# Patient Record
Sex: Male | Born: 1955 | Race: White | Hispanic: No | Marital: Married | Smoking: Never smoker
Health system: Southern US, Community
[De-identification: ages and names within clinical notes are randomized; demographics above are authoritative.]

## PROBLEM LIST (undated history)

## (undated) DIAGNOSIS — I219 Acute myocardial infarction, unspecified: Secondary | ICD-10-CM

## (undated) DIAGNOSIS — I2510 Atherosclerotic heart disease of native coronary artery without angina pectoris: Secondary | ICD-10-CM

## (undated) DIAGNOSIS — I10 Essential (primary) hypertension: Secondary | ICD-10-CM

## (undated) DIAGNOSIS — E785 Hyperlipidemia, unspecified: Secondary | ICD-10-CM

## (undated) DIAGNOSIS — Z9861 Coronary angioplasty status: Secondary | ICD-10-CM

## (undated) HISTORY — DX: Hyperlipidemia, unspecified: E78.5

## (undated) HISTORY — DX: Essential (primary) hypertension: I10

## (undated) HISTORY — PX: TONSILLECTOMY AND ADENOIDECTOMY: SUR1326

---

## 2004-09-22 HISTORY — PX: CORONARY ANGIOPLASTY WITH STENT PLACEMENT: SHX49

## 2005-09-22 HISTORY — PX: CORONARY ANGIOPLASTY WITH STENT PLACEMENT: SHX49

## 2013-09-22 HISTORY — PX: CORONARY ANGIOPLASTY WITH STENT PLACEMENT: SHX49

## 2014-08-09 ENCOUNTER — Encounter: Payer: Self-pay | Admitting: Internal Medicine

## 2014-08-09 ENCOUNTER — Ambulatory Visit (INDEPENDENT_AMBULATORY_CARE_PROVIDER_SITE_OTHER): Payer: 59 | Admitting: Internal Medicine

## 2014-08-09 VITALS — BP 126/84 | HR 70 | Temp 98.0°F | Resp 20 | Ht 67.0 in | Wt 205.0 lb

## 2014-08-09 DIAGNOSIS — I2583 Coronary atherosclerosis due to lipid rich plaque: Principal | ICD-10-CM

## 2014-08-09 DIAGNOSIS — I2510 Atherosclerotic heart disease of native coronary artery without angina pectoris: Secondary | ICD-10-CM

## 2014-08-09 MED ORDER — ATORVASTATIN CALCIUM 80 MG PO TABS: 80.0000 mg | ORAL_TABLET | Freq: Every day | ORAL | Status: DC

## 2014-08-09 NOTE — Progress Notes (Signed)
Pre visit review using our clinic review tool, if applicable. No additional management support is needed unless otherwise documented below in the visit note. 

## 2014-08-09 NOTE — Patient Instructions (Addendum)
Cardiology follow-up as discussed  Cardiac Diet This diet can help prevent heart disease and stroke. Many factors influence your heart health, including eating and exercise habits. Coronary risk rises a lot with abnormal blood fat (lipid) levels. Cardiac meal planning includes limiting unhealthy fats, increasing healthy fats, and making other small dietary changes. General guidelines are as follows:  Adjust calorie intake to reach and maintain desirable body weight.  Limit total fat intake to less than 30% of total calories. Saturated fat should be less than 7% of calories.  Saturated fats are found in animal products and in some vegetable products. Saturated vegetable fats are found in coconut oil, cocoa butter, palm oil, and palm kernel oil. Read labels carefully to avoid these products as much as possible. Use butter in moderation. Choose tub margarines and oils that have 2 grams of fat or less. Good cooking oils are canola and olive oils.  Practice low-fat cooking techniques. Do not fry food. Instead, broil, bake, boil, steam, grill, roast on a rack, stir-fry, or microwave it. Other fat reducing suggestions include:  Remove the skin from poultry.  Remove all visible fat from meats.  Skim the fat off stews, soups, and gravies before serving them.  Steam vegetables in water or broth instead of sauting them in fat.  Avoid foods with trans fat (or hydrogenated oils), such as commercially fried foods and commercially baked goods. Commercial shortening and deep-frying fats will contain trans fat.  Increase intake of fruits, vegetables, whole grains, and legumes to replace foods high in fat.  Increase consumption of nuts, legumes, and seeds to at least 4 servings weekly. One serving of a legume equals  cup, and 1 serving of nuts or seeds equals  cup.  Choose whole grains more often. Have 3 servings per day (a serving is 1 ounce [oz]).  Eat 4 to 5 servings of vegetables per day. A serving  of vegetables is 1 cup of raw leafy vegetables;  cup of raw or cooked cut-up vegetables;  cup of vegetable juice.  Eat 4 to 5 servings of fruit per day. A serving of fruit is 1 medium whole fruit;  cup of dried fruit;  cup of fresh, frozen, or canned fruit;  cup of 100% fruit juice.  Increase your intake of dietary fiber to 20 to 30 grams per day. Insoluble fiber may help lower your risk of heart disease and may help curb your appetite.  Soluble fiber binds cholesterol to be removed from the blood. Foods high in soluble fiber are dried beans, citrus fruits, oats, apples, bananas, broccoli, Brussels sprouts, and eggplant.  Try to include foods fortified with plant sterols or stanols, such as yogurt, breads, juices, or margarines. Choose several fortified foods to achieve a daily intake of 2 to 3 grams of plant sterols or stanols.  Foods with omega-3 fats can help reduce your risk of heart disease. Aim to have a 3.5 oz portion of fatty fish twice per week, such as salmon, mackerel, albacore tuna, sardines, lake trout, or herring. If you wish to take a fish oil supplement, choose one that contains 1 gram of both DHA and EPA.  Limit processed meats to 2 servings (3 oz portion) weekly.  Limit the sodium in your diet to 1500 milligrams (mg) per day. If you have high blood pressure, talk to a registered dietitian about a DASH (Dietary Approaches to Stop Hypertension) eating plan.  Limit sweets and beverages with added sugar, such as soda, to no more than  5 servings per week. One serving is:   1 tablespoon sugar.  1 tablespoon jelly or jam.   cup sorbet.  1 cup lemonade.   cup regular soda. CHOOSING FOODS Starches  Allowed: Breads: All kinds (wheat, rye, raisin, white, oatmeal, [place], [place], and English muffin bread). Low-fat rolls: English muffins, frankfurter and hamburger buns, bagels, pita bread, tortillas (not fried). Pancakes, waffles, biscuits, and muffins made with recommended  oil.  Avoid: Products made with saturated or trans fats, oils, or whole milk products. Butter rolls, cheese breads, croissants. Commercial doughnuts, muffins, sweet rolls, biscuits, waffles, pancakes, store-bought mixes. Crackers  Allowed: Low-fat crackers and snacks: Animal, graham, rye, saltine (with recommended oil, no lard), oyster, and matzo crackers. Bread sticks, melba toast, rusks, flatbread, pretzels, and light popcorn.  Avoid: High-fat crackers: cheese crackers, butter crackers, and those made with coconut, palm oil, or trans fat (hydrogenated oils). Buttered popcorn. Cereals  Allowed: Hot or cold whole-grain cereals.  Avoid: Cereals containing coconut, hydrogenated vegetable fat, or animal fat. Potatoes / Pasta / Rice  Allowed: All kinds of potatoes, rice, and pasta (such as macaroni, spaghetti, and noodles).  Avoid: Pasta or rice prepared with cream sauce or high-fat cheese. Chow mein noodles, [place] fries. Vegetables  Allowed: All vegetables and vegetable juices.  Avoid: Fried vegetables. Vegetables in cream, butter, or high-fat cheese sauces. Limit coconut. Fruit in cream or custard. Protein  Allowed: Limit your intake of meat, seafood, and poultry to no more than 6 oz (cooked weight) per day. All lean, well-trimmed beef, veal, pork, and lamb. All chicken and [place] without skin. All fish and shellfish. Wild game: wild duck, rabbit, pheasant, and venison. Egg whites or low-cholesterol egg substitutes may be used as desired. Meatless dishes: recipes with dried beans, peas, lentils, and tofu (soybean curd). Seeds and nuts: all seeds and most nuts.  Avoid: Prime grade and other heavily marbled and fatty meats, such as short ribs, spare ribs, rib eye roast or steak, frankfurters, sausage, bacon, and high-fat luncheon meats, mutton. Caviar. Commercially fried fish. Domestic duck, goose, venison sausage. Organ meats: liver, gizzard, heart, chitterlings, brains, kidney,  sweetbreads. Dairy  Allowed: Low-fat cheeses: nonfat or low-fat cottage cheese (1% or 2% fat), cheeses made with part skim milk, such as mozzarella, farmers, string, or ricotta. (Cheeses should be labeled no more than 2 to 6 grams fat per oz.). Skim (or 1%) milk: liquid, powdered, or evaporated. Buttermilk made with low-fat milk. Drinks made with skim or low-fat milk or cocoa. Chocolate milk or cocoa made with skim or low-fat (1%) milk. Nonfat or low-fat yogurt.  Avoid: Whole milk cheeses, including colby, cheddar, [place], Monterey Jack, [place], [place], [place], American, Swiss, and blue. Creamed cottage cheese, cream cheese. Whole milk and whole milk products, including buttermilk or yogurt made from whole milk, drinks made from whole milk. Condensed milk, evaporated whole milk, and 2% milk. Soups and Combination Foods  Allowed: Low-fat low-sodium soups: broth, dehydrated soups, homemade broth, soups with the fat removed, homemade cream soups made with skim or low-fat milk. Low-fat spaghetti, lasagna, chili, and Spanish rice if low-fat ingredients and low-fat cooking techniques are used.  Avoid: Cream soups made with whole milk, cream, or high-fat cheese. All other soups. Desserts and Sweets  Allowed: Sherbet, fruit ices, gelatins, meringues, and angel food cake. Homemade desserts with recommended fats, oils, and milk products. Jam, jelly, honey, marmalade, sugars, and syrups. Pure sugar candy, such as gum drops, hard candy, jelly beans, marshmallows, mints, and small amounts of dark  chocolate.  Avoid: Commercially prepared cakes, pies, cookies, frosting, pudding, or mixes for these products. Desserts containing whole milk products, chocolate, coconut, lard, palm oil, or palm kernel oil. Ice cream or ice cream drinks. Candy that contains chocolate, coconut, butter, hydrogenated fat, or unknown ingredients. Buttered syrups. Fats and Oils  Allowed: Vegetable oils: safflower, sunflower, corn,  soybean, cottonseed, sesame, canola, olive, or peanut. Non-hydrogenated margarines. Salad dressing or mayonnaise: homemade or commercial, made with a recommended oil. Low or nonfat salad dressing or mayonnaise.  Limit added fats and oils to 6 to 8 tsp per day (includes fats used in cooking, baking, salads, and spreads on bread). Remember to count the "hidden fats" in foods.  Avoid: Solid fats and shortenings: butter, lard, salt pork, bacon drippings. Gravy containing meat fat, shortening, or suet. Cocoa butter, coconut. Coconut oil, palm oil, palm kernel oil, or hydrogenated oils: these ingredients are often used in bakery products, nondairy creamers, whipped toppings, candy, and commercially fried foods. Read labels carefully. Salad dressings made of unknown oils, sour cream, or cheese, such as blue cheese and Roquefort. Cream, all kinds: half-and-half, light, heavy, or whipping. Sour cream or cream cheese (even if "light" or low-fat). Nondairy cream substitutes: coffee creamers and sour cream substitutes made with palm, palm kernel, hydrogenated oils, or coconut oil. Beverages  Allowed: Coffee (regular or decaffeinated), tea. Diet carbonated beverages, mineral water. Alcohol: Check with your caregiver. Moderation is recommended.  Avoid: Whole milk, regular sodas, and juice drinks with added sugar. Condiments  Allowed: All seasonings and condiments. Cocoa powder. "Cream" sauces made with recommended ingredients.  Avoid: Carob powder made with hydrogenated fats. SAMPLE MENU Breakfast   cup orange juice   cup oatmeal  1 slice toast  1 tsp margarine  1 cup skim milk Lunch  [place] sandwich with 2 oz [place], 2 slices bread  Lettuce and tomato slices  Fresh fruit  Carrot sticks  Coffee or tea Snack  Fresh fruit or low-fat crackers Dinner  3 oz lean ground beef  1 baked potato  1 tsp margarine   cup asparagus  Lettuce salad  1 tbs non-creamy dressing   cup peach  slices  1 cup skim milk Document Released: 06/17/2008 Document Revised: 03/09/2012 Document Reviewed: 11/08/2013 Exit[employer] Patient Information 2015 [place], [place]. This information is not intended to replace advice given to you by your health care provider. Make sure you discuss any questions you have with your health care provider.   Please obtain records from your prior physicians

## 2014-08-09 NOTE — Progress Notes (Signed)
Subjective:    Patient ID: Michael [name], male    DOB: 24-Jul-1956, 58 y.o.   MRN: [id number]  HPI 58 year old patient who is seen today to establish with our practice. He has a complex cardiac history which began in 2006 when he presented with an acute MI and underwent intervention for a 100% left circumflex lesion.  In 2007, he underwent stenting of another stenotic lesion, not treated in 2006.  He underwent another diagnostic catheterization in 2012 and in early September 2015 underwent stenting of a 90% RCA lesion  Social history.  Has relocated from [place], [place]; originally resided in [place].  Lifelong nonsmoker.  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  HENT: Negative for congestion, dental problem, ear pain, hearing loss, mouth sores, rhinorrhea, sinus pressure, sneezing, tinnitus, trouble swallowing and voice change.   Eyes: Negative for photophobia, pain, redness and visual disturbance.  Respiratory: Negative for apnea, cough, choking, chest tightness, shortness of breath and wheezing.   Cardiovascular: Negative for chest pain, palpitations and leg swelling.  Gastrointestinal: Negative for nausea, vomiting, abdominal pain, diarrhea, constipation, blood in stool, abdominal distention, anal bleeding and rectal pain.  Genitourinary: Negative for dysuria, urgency, frequency, hematuria, flank pain, decreased urine volume, discharge, penile swelling, scrotal swelling, difficulty urinating, genital sores and testicular pain.  Musculoskeletal: Negative for myalgias, back pain, joint  swelling, arthralgias, gait problem, neck pain and neck stiffness.       History of slight discomfort over the left lateral hip area  Skin: Negative for color change, rash and wound.  Neurological: Negative for dizziness, tremors, seizures, syncope, facial asymmetry, speech difficulty, weakness, light-headedness, numbness and headaches.  Hematological: Negative for adenopathy. Does not bruise/bleed easily.  Psychiatric/Behavioral: Negative for suicidal ideas, hallucinations, behavioral problems, confusion, sleep disturbance, self-injury, dysphoric mood, decreased concentration and agitation. The patient is not nervous/anxious.        Objective:   Physical Exam  Constitutional: He appears well-developed and well-nourished.  Blood pressure 140/90 right arm Blood pressure 120/80 left arm  HENT:  Head: Normocephalic and atraumatic.  Right Ear: External ear normal.  Left Ear: External ear normal.  Nose: Nose normal.  Mouth/Throat: Oropharynx is clear and moist.  Eyes: Conjunctivae and EOM are normal. Pupils are equal, round, and reactive to light. No scleral icterus.  Neck: Normal range of motion. Neck supple. No JVD present. No thyromegaly present.  Cardiovascular: Regular rhythm, normal heart sounds and intact distal pulses.  Exam reveals no gallop and no friction rub.   No murmur heard. Slight decreased right dorsalis pedis pulse  Pulmonary/Chest: Effort normal and breath sounds normal. He exhibits no tenderness.  Abdominal: Soft. Bowel sounds are normal. He exhibits no distension and no mass. There is no tenderness.  Genitourinary: Penis normal.  Musculoskeletal: Normal range of motion. He exhibits no edema or tenderness.  Lymphadenopathy:    He has no cervical adenopathy.  Neurological: He is alert. He has normal reflexes. No cranial nerve deficit. Coordination normal.  Skin: Skin is warm and dry. No rash noted.  Psychiatric: He has a normal mood and affect. His behavior is normal.  Assessment & Plan:   Coronary artery disease.  Will continue aggressive risk factor modification.  Will increase atorvastatin to 80 mg daily.  Refer to cardiology for ongoing follow-up  Recheck here 6 months.  Hypertension.  Left brachial blood pressure slightly lower compared to the right.  We'll recheck in 6 months and consider Doppler study to rule out left subclavian artery stenosis.  If confirmed

## 2014-08-31 ENCOUNTER — Other Ambulatory Visit: Payer: Self-pay | Admitting: Family Medicine

## 2014-08-31 MED ORDER — METOPROLOL SUCCINATE ER 25 MG PO TB24: 25.0000 mg | ORAL_TABLET | Freq: Every day | ORAL | Status: DC

## 2014-08-31 MED ORDER — LISINOPRIL 5 MG PO TABS: 5.0000 mg | ORAL_TABLET | Freq: Every day | ORAL | Status: DC

## 2014-08-31 MED ORDER — ATORVASTATIN CALCIUM 80 MG PO TABS: 80.0000 mg | ORAL_TABLET | Freq: Every day | ORAL | Status: DC

## 2014-08-31 NOTE — Telephone Encounter (Signed)
Pt seen for the first time on 08/09/14.  Lipitor was sent to a local pharmacy on that day.

## 2014-09-08 ENCOUNTER — Encounter: Payer: Self-pay | Admitting: Cardiology

## 2014-09-08 ENCOUNTER — Ambulatory Visit (INDEPENDENT_AMBULATORY_CARE_PROVIDER_SITE_OTHER): Payer: 59 | Admitting: Cardiology

## 2014-09-08 VITALS — BP 120/70 | HR 80 | Ht 67.0 in | Wt 206.0 lb

## 2014-09-08 DIAGNOSIS — E785 Hyperlipidemia, unspecified: Secondary | ICD-10-CM | POA: Insufficient documentation

## 2014-09-08 DIAGNOSIS — Z9861 Coronary angioplasty status: Secondary | ICD-10-CM

## 2014-09-08 DIAGNOSIS — I2510 Atherosclerotic heart disease of native coronary artery without angina pectoris: Secondary | ICD-10-CM | POA: Insufficient documentation

## 2014-09-08 DIAGNOSIS — Z951 Presence of aortocoronary bypass graft: Secondary | ICD-10-CM

## 2014-09-08 DIAGNOSIS — Z95820 Peripheral vascular angioplasty status with implants and grafts: Secondary | ICD-10-CM

## 2014-09-08 DIAGNOSIS — Z9889 Other specified postprocedural states: Secondary | ICD-10-CM

## 2014-09-08 NOTE — Patient Instructions (Signed)
Your physician recommends that you schedule a follow-up appointment in: 6 months with Dr. Percival Spanish  We want you to have a lipid profile  We will have you sign a information release form

## 2014-09-08 NOTE — Progress Notes (Signed)
HPI The patient presents for evaluation of coronary disease. He is new to me. He has moved from [place]. I don't have the records although he did have a card if from his first stent. He had an occluded circumflex in 2006 and had a Taxus stent. It sounds like he has a intervention with another stent in 2007. In September of this year he had some exertional chest discomfort and reports having a 90% right coronary lesion that was treated apparently with a drug-eluting stent. He has since had no further symptoms. He is doing his walking. He denies any chest pressure, neck or arm discomfort. He has had no palpitations, presyncope or syncope. He has had no weight gain or edema.  No Known Allergies  Current Outpatient Prescriptions  Medication Sig Dispense Refill  . aspirin 81 MG tablet Take 81 mg by mouth daily.    [name] atorvastatin (LIPITOR) 80 MG tablet Take 1 tablet (80 mg total) by mouth daily. 90 tablet 3  . Cholecalciferol (VITAMIN D3) 2000 UNITS TABS Take 1 tablet by mouth daily.    . CHROMIUM-CINNAMON PO Take 2,000 mg by mouth daily.    . Coenzyme Q10 (CO Q 10) 100 MG CAPS Take 2 capsules by mouth daily.    [name] lisinopril (PRINIVIL,ZESTRIL) 5 MG tablet Take 1 tablet (5 mg total) by mouth daily. 90 tablet 3  . metoprolol succinate (TOPROL-XL) 25 MG 24 hr tablet Take 1 tablet (25 mg total) by mouth daily. 90 tablet 3  . Multiple Vitamins-Minerals (EMERGEN-C VITAMIN C) PACK Take 1 Package by mouth daily.    Ernestine Conrad 3-6-9 Fatty Acids (OMEGA 3-6-9 PO) Take 3,200 mg by mouth daily.    . ticagrelor (BRILINTA) 90 MG TABS tablet Take 90 mg by mouth daily.     No current facility-administered medications for this visit.    Past Medical History  Diagnosis Date  . CAD (coronary artery disease)     Stents in [place] 2006, 2007, 2015    Past Surgical History  Procedure Laterality Date  . Tonsillectomy and adenoidectomy      Family History  Problem Relation Age of Onset  . Hypertension Father     . Dementia Father     Vascular  . Heart disease Maternal Grandfather 67    Died probably of heart diseasse  . Heart disease Maternal Uncle 54    Died of "heart exploding"    History   Social History  . Marital Status: Married    Spouse Name: N/A    Number of Children: N/A  . Years of Education: N/A   Occupational History  . Not on file.   Social History Main Topics  . Smoking status: Never Smoker   . Smokeless tobacco: Never Used  . Alcohol Use: No  . Drug Use: No  . Sexual Activity: Not on file   Other Topics Concern  . Not on file   Social History Narrative    ROS:    As stated in the HPI and negative for all other systems.  PHYSICAL EXAM BP 120/70 mmHg  Pulse 80  Ht 5\' 7"  (1.702 m)  Wt 206 lb (93.441 kg)  BMI 32.26 kg/m2  GENERAL:  Well appearing HEENT:  Pupils equal round and reactive, fundi not visualized, oral mucosa unremarkable NECK:  No jugular venous distention, waveform within normal limits, carotid upstroke brisk and symmetric, no bruits, no thyromegaly LYMPHATICS:  No cervical, inguinal adenopathy LUNGS:  Clear to auscultation bilaterally BACK:  No  CVA tenderness CHEST:  Unremarkable HEART:  PMI not displaced or sustained,S1 and S2 within normal limits, no S3, no S4, no clicks, no rubs, no murmurs ABD:  Flat, positive bowel sounds normal in frequency in pitch, no bruits, no rebound, no guarding, no midline pulsatile mass, no hepatomegaly, no splenomegaly EXT:  2 plus pulses throughout, no edema, no cyanosis no clubbing SKIN:  No rashes no nodules NEURO:  Cranial nerves II through XII grossly intact, motor grossly intact throughout PSYCH:  Cognitively intact, oriented to person place and time  EKG:   Sinus rhythm, rate no, axis within normal limits, intervals within normal limits, no acute ST-T wave changes.  09/08/2014   ASSESSMENT AND PLAN   CAD:  The patient has no new sypmtoms.  No further cardiovascular testing is indicated.  We will  continue with aggressive risk reduction and meds as listed. We discussed risk reduction and in particular exercise quite a while.  OVERWEIGHT:  We discussed weight loss with diet and exercise.  DYSLIPIDEMIA:  I will have him come back for fasting lipid profile.

## 2014-09-14 ENCOUNTER — Telehealth: Payer: Self-pay | Admitting: Cardiology

## 2014-09-14 NOTE — Telephone Encounter (Signed)
Faxed request for records (signed release) to [facility] -Dr Lillia Dallas- as requested by Dr Percival Spanish.  Faxed on 09/14/14. lp

## 2014-10-10 NOTE — Telephone Encounter (Signed)
Received records from Dr Lillia Dallas at [facility] as requested by Dr Percival Spanish.  Records were given to Dr Percival Spanish

## 2015-01-05 ENCOUNTER — Telehealth: Payer: Self-pay | Admitting: Cardiology

## 2015-01-05 DIAGNOSIS — Z79899 Other long term (current) drug therapy: Secondary | ICD-10-CM

## 2015-01-05 DIAGNOSIS — E785 Hyperlipidemia, unspecified: Secondary | ICD-10-CM

## 2015-01-05 NOTE — Telephone Encounter (Signed)
Mr. Codner is needing a lab order sent to him before his appt on 03/02/2015  Thanks

## 2015-01-05 NOTE — Telephone Encounter (Signed)
Left message, will mail  lab slip  , if lipids have been done in the last several months just bring a copy of results

## 2015-02-10 LAB — LIPID PANEL
Cholesterol: 107 mg/dL (ref 0–200)
HDL: 39 mg/dL — ABNORMAL LOW (ref >=40)
LDL Cholesterol: 41 mg/dL (ref 0–99)
Total CHOL/HDL Ratio: 2.7 Ratio
Triglycerides: 137 mg/dL (ref <150)
VLDL: 27 mg/dL (ref 0–40)

## 2015-03-02 ENCOUNTER — Ambulatory Visit (INDEPENDENT_AMBULATORY_CARE_PROVIDER_SITE_OTHER): Payer: Self-pay | Admitting: Cardiology

## 2015-03-02 ENCOUNTER — Ambulatory Visit: Payer: Self-pay | Admitting: Cardiology

## 2015-03-02 ENCOUNTER — Encounter: Payer: Self-pay | Admitting: Cardiology

## 2015-03-02 VITALS — BP 110/76 | HR 63 | Ht 66.5 in | Wt 200.7 lb

## 2015-03-02 DIAGNOSIS — I2510 Atherosclerotic heart disease of native coronary artery without angina pectoris: Secondary | ICD-10-CM

## 2015-03-02 NOTE — Progress Notes (Signed)
HPI The patient presents for evaluation of coronary disease. He is new to me. He has moved from [place]. I don't have the records from all of the caths but today I was able to review one cath note from last Sept of last year.  At that time he had a right coronary lesion that was treated with a drug-eluting stent. He has since had no further symptoms. He is doing his walking. He denies any neck or arm discomfort. He has had no palpitations, presyncope or syncope. He has had no weight gain or edema.  He had one one episode of chest discomfort that required nitroglycerin about 3-4 weeks ago. However, this was not like previous angina and somewhat out of the blue.  No Known Allergies  Current Outpatient Prescriptions  Medication Sig Dispense Refill  . aspirin 81 MG tablet Take 81 mg by mouth daily.    [name] atorvastatin (LIPITOR) 80 MG tablet Take 1 tablet (80 mg total) by mouth daily. 90 tablet 3  . Cholecalciferol (VITAMIN D3) 2000 UNITS TABS Take 1 tablet by mouth daily.    . CHROMIUM-CINNAMON PO Take 2,000 mg by mouth daily.    . Coenzyme Q10 (CO Q 10) 100 MG CAPS Take 2 capsules by mouth daily.    [name] lisinopril (PRINIVIL,ZESTRIL) 5 MG tablet Take 1 tablet (5 mg total) by mouth daily. 90 tablet 3  . metoprolol succinate (TOPROL-XL) 25 MG 24 hr tablet Take 1 tablet (25 mg total) by mouth daily. 90 tablet 3  . Multiple Vitamins-Minerals (EMERGEN-C VITAMIN C) PACK Take 1 Package by mouth daily.    Ernestine Conrad 3-6-9 Fatty Acids (OMEGA 3-6-9 PO) Take 3,200 mg by mouth daily.    . ticagrelor (BRILINTA) 90 MG TABS tablet Take 90 mg by mouth daily.     No current facility-administered medications for this visit.    Past Medical History  Diagnosis Date  . CAD (coronary artery disease)     Stents in [place] 2006, 2007, 2015  . Hyperlipidemia   . HTN (hypertension)     Past Surgical History  Procedure Laterality Date  . Tonsillectomy and adenoidectomy      ROS:    As stated in the HPI and negative  for all other systems.  PHYSICAL EXAM BP 110/76 mmHg  Pulse 63  Ht 5' 6.5" (1.689 m)  Wt 200 lb 11.2 oz (91.037 kg)  BMI 31.91 kg/m2  GENERAL:  Well appearing HEENT:  Pupils equal round and reactive, fundi not visualized, oral mucosa unremarkable NECK:  No jugular venous distention, waveform within normal limits, carotid upstroke brisk and symmetric, no bruits, no thyromegaly LUNGS:  Clear to auscultation bilaterally CHEST:  Unremarkable HEART:  PMI not displaced or sustained,S1 and S2 within normal limits, no S3, no S4, no clicks, no rubs, no murmurs ABD:  Flat, positive bowel sounds normal in frequency in pitch, no bruits, no rebound, no guarding, no midline pulsatile mass, no hepatomegaly, no splenomegaly EXT:  2 plus pulses throughout, no edema, no cyanosis no clubbing SKIN:  No rashes no nodules   EKG:   Sinus rhythm, rate 63, axis within normal limits, intervals within normal limits, no acute ST-T wave changes.  03/02/2015   ASSESSMENT AND PLAN   CAD:  The patient has no new sypmtoms.  No further cardiovascular testing is indicated.  We will continue with aggressive risk reduction and meds as listed. He will continue the Brilinita until about Oct 1st of this year.    OVERWEIGHT:  We discussed weight loss with diet and exercise at the previous visit.  DYSLIPIDEMIA:  His LDL in May was 41.  He will continue the meds as listed.

## 2015-03-02 NOTE — Patient Instructions (Signed)
Your physician wants you to follow-up in: 6 Months You will receive a reminder letter in the mail two months in advance. If you don't receive a letter, please call our office to schedule the follow-up appointment.  Your physician has recommended you make the following change in your medication: STOP BRILINTA in OCTOBER

## 2015-03-19 ENCOUNTER — Telehealth: Payer: Self-pay | Admitting: Internal Medicine

## 2015-03-19 NOTE — Telephone Encounter (Signed)
55 Initial Comment Caller states husband having a reaction to something, bubbling up rash on one side of face and going down on neck Nurse Assessment Nurse: Donalynn Furlong, RN, Myna Hidalgo Date/Time Eilene Ghazi Time): 03/19/2015 1:02:13 PM Confirm and document reason for call. If symptomatic, describe symptoms. ---Caller states husband having a reaction to something, bubbling up rash on one side of face and going down on neck. Pt states this started Friday morning. No trouble breathing or swallowing. Caller states pt was doing yardwork and had his hands in leaves the day before it started. "It looks like Poison Ivy" Has the patient traveled out of the country within the last 30 days? ---Not Applicable Does the patient require triage? ---Yes Related visit to physician within the last 2 weeks? ---No Does the PT have any chronic conditions? (i.e. diabetes, asthma, etc.) ---Unknown Guidelines Guideline Title Affirmed Question Affirmed Notes Poison Ivy - Oak - Sumac Mild rash from poison ivy, oak or sumac (all triage questions negative) Final Disposition User [place], RN, Myna Hidalgo

## 2015-03-19 NOTE — Telephone Encounter (Signed)
Left message on voicemail to call office.  

## 2015-03-21 NOTE — Telephone Encounter (Signed)
Spoke to pt,  Asked how the poison was? Pt said it is spreading to his legs, face is a little better, using benadryl and cortisone. Told pt I think it would be best to be seen and I can get you in tomorrow. Pt said that would be fine. Told pt to come tomorrow at 11:00 AM to see Dr.K. Pt verbalized understanding. Appointment scheduled.

## 2015-03-22 ENCOUNTER — Ambulatory Visit (INDEPENDENT_AMBULATORY_CARE_PROVIDER_SITE_OTHER): Payer: TRICARE For Life (TFL) | Admitting: Internal Medicine

## 2015-03-22 ENCOUNTER — Encounter: Payer: Self-pay | Admitting: Internal Medicine

## 2015-03-22 VITALS — BP 120/78 | HR 74 | Temp 98.1°F | Resp 20 | Ht 66.5 in | Wt 200.0 lb

## 2015-03-22 DIAGNOSIS — L259 Unspecified contact dermatitis, unspecified cause: Secondary | ICD-10-CM | POA: Diagnosis not present

## 2015-03-22 DIAGNOSIS — I2510 Atherosclerotic heart disease of native coronary artery without angina pectoris: Secondary | ICD-10-CM

## 2015-03-22 DIAGNOSIS — E785 Hyperlipidemia, unspecified: Secondary | ICD-10-CM | POA: Diagnosis not present

## 2015-03-22 MED ORDER — PREDNISONE 10 MG PO TABS: ORAL_TABLET | ORAL | Status: DC

## 2015-03-22 MED ORDER — ATORVASTATIN CALCIUM 80 MG PO TABS: 80.0000 mg | ORAL_TABLET | Freq: Every day | ORAL | Status: DC

## 2015-03-22 MED ORDER — METOPROLOL SUCCINATE ER 25 MG PO TB24: 25.0000 mg | ORAL_TABLET | Freq: Every day | ORAL | Status: DC

## 2015-03-22 MED ORDER — TICAGRELOR 90 MG PO TABS: 90.0000 mg | ORAL_TABLET | Freq: Every day | ORAL | Status: DC

## 2015-03-22 MED ORDER — LISINOPRIL 5 MG PO TABS: 5.0000 mg | ORAL_TABLET | Freq: Every day | ORAL | Status: DC

## 2015-03-22 NOTE — Progress Notes (Signed)
   Subjective:    Patient ID: Michael [name], male    DOB: [birth year], 59 y.o.   MRN: [id number]  HPI 59 year old patient who has a history of dyslipidemia and coronary artery disease.  He was doing some yard work recently and presents today with a pruritic rash over extremities and the facial area.  The rash has been spreading.  He has been quite sensitive in the past to poison ivy  Past Medical History  Diagnosis Date  . CAD (coronary artery disease)     Stents in [place] 2006, 2007, 2015  . Hyperlipidemia   . HTN (hypertension)     History   Social History  . Marital Status: Married    Spouse Name: N/A  . Number of Children: 4  . Years of Education: N/A   Occupational History  . Not on file.   Social History Main Topics  . Smoking status: Never Smoker   . Smokeless tobacco: Never Used  . Alcohol Use: No  . Drug Use: No  . Sexual Activity: Not on file   Other Topics Concern  . 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 Tonsillectomy and adenoidectomy      Family History  Problem Relation Age of Onset  . Hypertension Father   . Dementia Father     Vascular  . Heart disease Maternal Grandfather 34    Died probably of heart diseasse  . Heart disease Maternal Uncle 101    Died of "heart exploding"    No Known Allergies  Current Outpatient Prescriptions on File Prior to Visit  Medication Sig Dispense Refill  . aspirin 81 MG tablet Take 81 mg by mouth daily.    . Cholecalciferol (VITAMIN D3) 2000 UNITS TABS Take 1 tablet by mouth daily.    . CHROMIUM-CINNAMON PO Take 2,000 mg by mouth daily.    . Coenzyme Q10 (CO Q 10) 100 MG CAPS Take 2 capsules by mouth daily.    . Multiple Vitamins-Minerals (EMERGEN-C VITAMIN C) PACK Take 1 Package by mouth daily.    Ernestine Conrad 3-6-9 Fatty Acids (OMEGA 3-6-9 PO) Take 3,200 mg by mouth daily.     No current  facility-administered medications on file prior to visit.    BP 120/78 mmHg  Pulse 74  Temp(Src) 98.1 F (36.7 C) (Oral)  Resp 20  Ht 5' 6.5" (1.689 m)  Wt 200 lb (90.719 kg)  BMI 31.80 kg/m2  SpO2 98%      Review of Systems  Skin: Positive for rash.       Objective:   Physical Exam  Constitutional: He appears well-developed and well-nourished. No distress.  Skin:  Scattered dermatitis over the extremities and facial areas. Consistent with contact dermatitis          Assessment & Plan:   Contact dermatitis  Will treat with 12 days of oral prednisone taper

## 2015-03-22 NOTE — Progress Notes (Signed)
Pre visit review using our clinic review tool, if applicable. No additional management support is needed unless otherwise documented below in the visit note. 

## 2015-03-22 NOTE — Patient Instructions (Signed)

## 2015-06-06 ENCOUNTER — Encounter: Payer: Self-pay | Admitting: Adult Health

## 2015-06-06 ENCOUNTER — Ambulatory Visit (INDEPENDENT_AMBULATORY_CARE_PROVIDER_SITE_OTHER): Payer: TRICARE For Life (TFL) | Admitting: Adult Health

## 2015-06-06 VITALS — BP 110/70 | Temp 98.2°F | Ht 66.5 in | Wt 198.1 lb

## 2015-06-06 DIAGNOSIS — L237 Allergic contact dermatitis due to plants, except food: Secondary | ICD-10-CM | POA: Diagnosis not present

## 2015-06-06 MED ORDER — PREDNISONE 20 MG PO TABS: 20.0000 mg | ORAL_TABLET | Freq: Every day | ORAL | Status: DC

## 2015-06-06 MED ORDER — METHYLPREDNISOLONE ACETATE 80 MG/ML IJ SUSP
80.0000 mg | Freq: Once | INTRAMUSCULAR | Status: AC
  Administered 2015-06-06: 80 mg via INTRAMUSCULAR

## 2015-06-06 NOTE — Progress Notes (Signed)
Pre visit review using our clinic review tool, if applicable. No additional management support is needed unless otherwise documented below in the visit note. 

## 2015-06-06 NOTE — Progress Notes (Signed)
Subjective:    Patient ID: Michael [name], male    DOB: 07/22/1956, 59 y.o.   MRN: [id number]  HPI  Monday he was mowing the yard when he mowed through a patch of poison ivy. He first noticed itching on Monday night and Tuesday morning he started noticing swelling to his bottom lip and slight swelling around both eyes. He has redness on forehead and about bilateral eyes.   No problems with breathing, feeling SOB or feeling like his throat is going to close up.   Review of Systems  Constitutional: Negative.   HENT: Positive for facial swelling. Negative for congestion, drooling, sore throat, trouble swallowing and voice change.   Respiratory: Positive for stridor. Negative for chest tightness, shortness of breath and wheezing.   Cardiovascular: Negative.   Skin: Positive for color change and rash.  Neurological: Negative.    Past Medical History  Diagnosis Date  . CAD (coronary artery disease)     Stents in [place] 2006, 2007, 2015  . Hyperlipidemia   . HTN (hypertension)     Social History   Social History  . Marital Status: Married    Spouse Name: N/A  . Number of Children: 4  . Years of Education: N/A   Occupational History  . Not on file.   Social History Main Topics  . Smoking status: Never Smoker   . Smokeless tobacco: Never Used  . Alcohol Use: No  . Drug Use: No  . Sexual Activity: Not on file   Other Topics Concern  . 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 Tonsillectomy and adenoidectomy      Family History  Problem Relation Age of Onset  . Hypertension Father   . Dementia Father     Vascular  . Heart disease Maternal Grandfather 15    Died probably of heart diseasse  . Heart disease Maternal Uncle 41    Died of "heart exploding"    No Known Allergies  Current Outpatient Prescriptions on File Prior to Visit  Medication  Sig Dispense Refill  . aspirin 81 MG tablet Take 81 mg by mouth daily.    [name] atorvastatin (LIPITOR) 80 MG tablet Take 1 tablet (80 mg total) by mouth daily. 90 tablet 3  . Cholecalciferol (VITAMIN D3) 2000 UNITS TABS Take 1 tablet by mouth daily.    . CHROMIUM-CINNAMON PO Take 2,000 mg by mouth daily.    . Coenzyme Q10 (CO Q 10) 100 MG CAPS Take 2 capsules by mouth daily.    [name] lisinopril (PRINIVIL,ZESTRIL) 5 MG tablet Take 1 tablet (5 mg total) by mouth daily. 90 tablet 3  . metoprolol succinate (TOPROL-XL) 25 MG 24 hr tablet Take 1 tablet (25 mg total) by mouth daily. 90 tablet 3  . Multiple Vitamins-Minerals (EMERGEN-C VITAMIN C) PACK Take 1 Package by mouth daily.    Ernestine Conrad 3-6-9 Fatty Acids (OMEGA 3-6-9 PO) Take 3,200 mg by mouth daily.    . ticagrelor (BRILINTA) 90 MG TABS tablet Take 1 tablet (90 mg total) by mouth daily. 30 tablet 5   No current facility-administered medications on file prior to visit.    BP 110/70 mmHg  Temp(Src) 98.2 F (36.8 C) (Oral)  Ht 5' 6.5" (1.689 m)  Wt 198 lb 1.6 oz (89.858 kg)  BMI 31.50 kg/m2  Objective:   Physical Exam  Constitutional: He is oriented to person, place, and time. He appears well-developed and well-nourished. No distress.  HENT:  Head: Normocephalic and atraumatic.  Right Ear: External ear normal.  Left Ear: External ear normal.  Nose: Nose normal.  Mouth/Throat: Oropharynx is clear and moist.  Eyes: Conjunctivae and EOM are normal. Pupils are equal, round, and reactive to light. Right eye exhibits no discharge. Left eye exhibits no discharge.  Swelling and redness under bilateral eyes  Cardiovascular: Normal rate, regular rhythm, normal heart sounds and intact distal pulses.  Exam reveals no gallop and no friction rub.   No murmur heard. Pulmonary/Chest: Effort normal and breath sounds normal. No respiratory distress. He has no wheezes. He has no rales. He exhibits no tenderness.  Neurological: He is alert and oriented  to person, place, and time.  Skin: Skin is warm and dry. Rash noted. He is not diaphoretic. No erythema. No pallor.  Red rash without blisters on forehead. Redness and swelling under bilateral eyes. Swelling to bottom lip, one small blister on lower lip.   No edema to airway  Psychiatric: He has a normal mood and affect. His behavior is normal. Judgment and thought content normal.  Nursing note and vitals reviewed.      Assessment & Plan:  1. Poison ivy dermatitis - predniSONE (DELTASONE) 20 MG tablet; Take 1 tablet (20 mg total) by mouth daily with breakfast.  Dispense: 16 tablet; Refill: 0 40mg  x 5 days, 20 mg x 4 days, 10 mg x 4 days.  - methylPREDNISolone acetate (DEPO-MEDROL) injection 80 mg; Inject 1 mL (80 mg total) into the muscle once.

## 2015-06-06 NOTE — Patient Instructions (Addendum)
It was great meeting you today!  Please take the prednisone as directed  Day 1 40 mg Day 2 40 mg Day 3 40 mg Day 4 40 mg Day 5 40 mg  Day 6 - 20 mg Day 7 - 20 mg Day 8- 20 mg Day 9 - 20 mg  Day 10 - 10 mg Day 11- 10 mg Day 12- 10 mg Day 13- 10 mg  Go to the ER with any feeling of SOB or if your throat is closing.   Poison [employer] ivy is a inflammation of the skin (contact dermatitis) caused by touching the allergens on the leaves of the ivy plant following previous exposure to the plant. The rash usually appears 48 hours after exposure. The rash is usually bumps (papules) or blisters (vesicles) in a linear pattern. Depending on your own sensitivity, the rash may simply cause redness and itching, or it may also progress to blisters which may break open. These must be well cared for to prevent secondary bacterial (germ) infection, followed by scarring. Keep any open areas dry, clean, dressed, and covered with an antibacterial ointment if needed. The eyes may also get puffy. The puffiness is worst in the morning and gets better as the day progresses. This dermatitis usually heals without scarring, within 2 to 3 weeks without treatment. HOME CARE INSTRUCTIONS  Thoroughly wash with soap and water as soon as you have been exposed to poison ivy. You have about one half hour to remove the plant resin before it will cause the rash. This washing will destroy the oil or antigen on the skin that is causing, or will cause, the rash. Be sure to wash under your fingernails as any plant resin there will continue to spread the rash. Do not rub skin vigorously when washing affected area. Poison ivy cannot spread if no oil from the plant remains on your body. A rash that has progressed to weeping sores will not spread the rash unless you have not washed thoroughly. It is also important to wash any clothes you have been wearing as these may carry active allergens. The rash will return if you wear the  unwashed clothing, even several days later. Avoidance of the plant in the future is the best measure. Poison ivy plant can be recognized by the number of leaves. Generally, poison ivy has three leaves with flowering branches on a single stem. Diphenhydramine may be purchased over the counter and used as needed for itching. Do not drive with this medication if it makes you drowsy.Ask your caregiver about medication for children. SEEK MEDICAL CARE IF:  Open sores develop.  Redness spreads beyond area of rash.  You notice purulent (pus-like) discharge.  You have increased pain.  Other signs of infection develop (such as fever). Document Released: 09/05/2000 Document Revised: 12/01/2011 Document Reviewed: 02/16/2009 [facility] Patient Information 2015 [place], [place]. This information is not intended to replace advice given to you by your health care provider. Make sure you discuss any questions you have with your health care provider.

## 2015-06-10 ENCOUNTER — Encounter (HOSPITAL_COMMUNITY): Payer: Self-pay | Admitting: Emergency Medicine

## 2015-06-10 ENCOUNTER — Inpatient Hospital Stay (HOSPITAL_COMMUNITY)
Admission: EM | Admit: 2015-06-10 | Discharge: 2015-06-11 | DRG: 313 | Disposition: A | Discharge status: Home / Self Care | Attending: Cardiology | Admitting: Cardiology

## 2015-06-10 ENCOUNTER — Emergency Department (HOSPITAL_COMMUNITY)

## 2015-06-10 DIAGNOSIS — I252 Old myocardial infarction: Secondary | ICD-10-CM | POA: Diagnosis not present

## 2015-06-10 DIAGNOSIS — L237 Allergic contact dermatitis due to plants, except food: Secondary | ICD-10-CM | POA: Diagnosis present

## 2015-06-10 DIAGNOSIS — Z79899 Other long term (current) drug therapy: Secondary | ICD-10-CM | POA: Diagnosis not present

## 2015-06-10 DIAGNOSIS — I10 Essential (primary) hypertension: Secondary | ICD-10-CM | POA: Diagnosis present

## 2015-06-10 DIAGNOSIS — R079 Chest pain, unspecified: Secondary | ICD-10-CM

## 2015-06-10 DIAGNOSIS — Z7982 Long term (current) use of aspirin: Secondary | ICD-10-CM | POA: Diagnosis not present

## 2015-06-10 DIAGNOSIS — I2510 Atherosclerotic heart disease of native coronary artery without angina pectoris: Secondary | ICD-10-CM | POA: Diagnosis present

## 2015-06-10 DIAGNOSIS — E669 Obesity, unspecified: Secondary | ICD-10-CM

## 2015-06-10 DIAGNOSIS — E785 Hyperlipidemia, unspecified: Secondary | ICD-10-CM | POA: Diagnosis present

## 2015-06-10 DIAGNOSIS — R0789 Other chest pain: Principal | ICD-10-CM | POA: Diagnosis present

## 2015-06-10 DIAGNOSIS — Z955 Presence of coronary angioplasty implant and graft: Secondary | ICD-10-CM

## 2015-06-10 DIAGNOSIS — I25119 Atherosclerotic heart disease of native coronary artery with unspecified angina pectoris: Secondary | ICD-10-CM

## 2015-06-10 DIAGNOSIS — I200 Unstable angina: Secondary | ICD-10-CM | POA: Diagnosis not present

## 2015-06-10 DIAGNOSIS — Z9861 Coronary angioplasty status: Secondary | ICD-10-CM

## 2015-06-10 LAB — BASIC METABOLIC PANEL
Anion gap: 8 (ref 5–15)
BUN: 18 mg/dL (ref 6–20)
CO2: 26 mmol/L (ref 22–32)
Calcium: 8.7 mg/dL — ABNORMAL LOW (ref 8.9–10.3)
Chloride: 100 mmol/L — ABNORMAL LOW (ref 101–111)
Creatinine, Ser: 1.02 mg/dL (ref 0.61–1.24)
GFR calc Af Amer: >60 mL/min (ref >60)
GFR calc non Af Amer: >60 mL/min (ref >60)
Glucose, Bld: 193 mg/dL — ABNORMAL HIGH (ref 65–99)
Potassium: 4.2 mmol/L (ref 3.5–5.1)
Sodium: 134 mmol/L — ABNORMAL LOW (ref 135–145)

## 2015-06-10 LAB — CBC
HCT: 40.3 % (ref 39.0–52.0)
Hemoglobin: 14.3 g/dL (ref 13.0–17.0)
MCH: 32.6 pg (ref 26.0–34.0)
MCHC: 35.5 g/dL (ref 30.0–36.0)
MCV: 92 fL (ref 78.0–100.0)
Platelets: 253 10*3/uL (ref 150–400)
RBC: 4.38 MIL/uL (ref 4.22–5.81)
RDW: 12.9 % (ref 11.5–15.5)
WBC: 10.3 10*3/uL (ref 4.0–10.5)

## 2015-06-10 LAB — I-STAT TROPONIN, ED: Troponin i, poc: 0.01 ng/mL (ref 0.00–0.08)

## 2015-06-10 LAB — TROPONIN I: Troponin I: <0.03 ng/mL (ref <0.031)

## 2015-06-10 MED ORDER — ASPIRIN EC 81 MG PO TBEC: 81.0000 mg | DELAYED_RELEASE_TABLET | Freq: Every day | ORAL | Status: DC

## 2015-06-10 MED ORDER — TICAGRELOR 90 MG PO TABS
90.0000 mg | ORAL_TABLET | Freq: Two times a day (BID) | ORAL | Status: DC
  Administered 2015-06-10: 90 mg via ORAL
  Filled 2015-06-10 (×3): qty 1

## 2015-06-10 MED ORDER — ATORVASTATIN CALCIUM 80 MG PO TABS
80.0000 mg | ORAL_TABLET | Freq: Every day | ORAL | Status: DC
  Filled 2015-06-10: qty 1

## 2015-06-10 MED ORDER — ONDANSETRON HCL 4 MG/2ML IJ SOLN: 4.0000 mg | Freq: Four times a day (QID) | INTRAMUSCULAR | Status: DC | PRN

## 2015-06-10 MED ORDER — NITROGLYCERIN 0.4 MG SL SUBL: 0.4000 mg | SUBLINGUAL_TABLET | SUBLINGUAL | Status: DC | PRN

## 2015-06-10 MED ORDER — METOPROLOL SUCCINATE ER 25 MG PO TB24: 25.0000 mg | ORAL_TABLET | Freq: Every day | ORAL | Status: DC

## 2015-06-10 MED ORDER — LISINOPRIL 10 MG PO TABS: 5.0000 mg | ORAL_TABLET | Freq: Every day | ORAL | Status: DC

## 2015-06-10 MED ORDER — ACETAMINOPHEN 325 MG PO TABS: 650.0000 mg | ORAL_TABLET | ORAL | Status: DC | PRN

## 2015-06-10 MED ORDER — ENOXAPARIN SODIUM 40 MG/0.4ML ~~LOC~~ SOLN
40.0000 mg | SUBCUTANEOUS | Status: DC
  Administered 2015-06-10: 40 mg via SUBCUTANEOUS
  Filled 2015-06-10 (×2): qty 0.4

## 2015-06-10 NOTE — H&P (Addendum)
History and Physical   Admit date: 06/10/2015 Name:  Michael [name] Medical record number: [id number] DOB/Age:  11/07/1955  59 y.o. male  Referring Physician:   [facility]   Primary Cardiologist:  Dr. Percival Spanish  Primary Physician:  Dr. Cordelia Pen  Chief complaint/reason for admission: Chest pain   HPI:  This 59 year old male presented to the emergency room with prolonged left-sided chest discomfort and sweating that lasted about 4 hours.  He has a history of coronary artery disease with a stent placed to the circumflex in the setting of an infarction in 2006 later had a stent to the ostium of the right coronary artery in 2007.  He developed exertional burning pain in his mid sternal chest and had a stent placed to the midportion of the right coronary artery which was a 3.0 x 22 mm resolute drug-eluting stent in [facility] last September.  He moved to [facility] and has been seen by cardiology and had been asymptomatic.  He developed some sweating earlier today and then developed left-sided chest discomfort at church.  He then noted that the discomfort intensified after walking around and it was described as left-sided occasionally mildly pleuritic.  The discomfort persisted and he took a nitroglycerin but noted no relief and came to the emergency room.  The discomfort has resolved and he is currently pain free.  2 troponins are negative and EKG is currently unremarkable.  He normally walks on a regular basis and does not have significant issues with exertional chest pain.   Past Medical History  Diagnosis Date  . CAD (coronary artery disease)     [place]ents in [place] 2006, 2007, 2015  . Hyperlipidemia   . HTN (hypertension)      Past Surgical History  Procedure Laterality Date  . Tonsillectomy and adenoidectomy     Allergies: has No Known Allergies.   Medications: Prior to Admission medications   Medication Sig Start Date End Date Taking? Authorizing Provider   aspirin 81 MG tablet Take 81 mg by mouth daily.   Yes Historical Provider, MD  atorvastatin (LIPITOR) 80 MG tablet Take 1 tablet (80 mg total) by mouth daily. Patient taking differently: Take 80 mg by mouth daily at 6 PM.  03/22/15  Yes Marletta Lor, MD  Cinnamon 500 MG capsule Take 500 mg by mouth daily.   Yes Historical Provider, MD  lisinopril (PRINIVIL,ZESTRIL) 5 MG tablet Take 1 tablet (5 mg total) by mouth daily. 03/22/15  Yes Marletta Lor, MD  metoprolol succinate (TOPROL-XL) 25 MG 24 hr tablet Take 1 tablet (25 mg total) by mouth daily. 03/22/15  Yes Marletta Lor, MD  Multiple Vitamins-Minerals (EMERGEN-C VITAMIN C) PACK Take 1 Package by mouth daily.   Yes Historical Provider, MD  Omega 3-6-9 CAPS Take 3,200 mg by mouth daily.   Yes Historical Provider, MD  predniSONE (DELTASONE) 20 MG tablet Take 1 tablet (20 mg total) by mouth daily with breakfast. 06/06/15  Yes Dorothyann Peng, NP  ticagrelor (BRILINTA) 90 MG TABS tablet Take 1 tablet (90 mg total) by mouth daily. Patient taking differently: Take 90 mg by mouth 2 (two) times daily.  03/22/15  Yes Marletta Lor, MD   Family History:  Family Status  Relation Status Death Age  . Father Deceased 52    dementia  . Mother Deceased 48    old age  . Sister Alive   . Brother Deceased     cancer, half brother  . Brother Alive  half brother    Social History:   reports that he has never smoked. He has never used smokeless tobacco. He reports that he does not drink alcohol or use illicit drugs.   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  No GI complaints.  He has moderate mid back pain that he attributes to arthritis.  No history of stroke or TIA.  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 Other than as noted above, the remainder of the  review of systems is normal  Physical Exam: BP 117/66 mmHg  Pulse 57  Temp(Src) 97.4 F (36.3 C) (Oral)  Resp 14  Ht 5' 6.5" (1.689 m)  Wt 88.451 kg (195 lb)  BMI 31.01 kg/m2  SpO2 99% General appearance: Pleasant mildly obese white male in no acute distress Head: Normocephalic, without obvious abnormality, atraumatic Eyes: conjunctivae/corneas clear. PERRL, EOM's intact. Fundi benign. Neck: no adenopathy, no carotid bruit, no JVD and supple, symmetrical, trachea midline Lungs: clear to auscultation bilaterally Heart: regular rate and rhythm, S1, S2 normal, no murmur, click, rub or gallop Abdomen: soft, non-tender; bowel sounds normal; no masses,  no organomegaly Rectal: deferred Extremities: extremities normal, atraumatic, no cyanosis or edema Pulses: 2+ and symmetric Skin: Skin color, texture, turgor normal. No rashes or lesions Neurologic: Grossly normal  Labs: CBC  Recent Labs  06/10/15 1605  WBC 10.3  RBC 4.38  HGB 14.3  HCT 40.3  PLT 253  MCV 92.0  MCH 32.6  MCHC 35.5  RDW 12.9   CMP   Recent Labs  06/10/15 1605  NA 134*  K 4.2  CL 100*  CO2 26  GLUCOSE 193*  BUN 18  CREATININE 1.02  CALCIUM 8.7*  GFRNONAA >60  GFRAA >60   BNP (last 3 results)  Cardiac Panel (last 3 results)  Recent Labs  06/10/15 1913  TROPONINI <0.03   EKG: EKG is normal  Radiology: No acute disease   IMPRESSIONS: 1.  Prolonged left-sided chest discomfort and sweating at rest in a patient with known coronary artery disease consistent with unstable angina pectoris 2.  Coronary artery disease with previous stenting of the right coronary artery in 2 places as well as the circumflex 3.  History of LV dysfunction and last catheterization of EF of 40% 4.  Hyperlipidemia 5.  Hypertension   PLAN: Initial enzymes are negative and EKG is normal.  Pain had some atypical features to it.  Continue to evaluate serial enzymes.  With normal EKG and negative enzymes and atypical  nature of symptoms, no intravenous heparin.  Keep nothing by mouth after midnight for possible stress test versus catheterization tomorrow.  Signed: Kerry Hough MD [facility]  06/10/2015, 9:36 PM

## 2015-06-10 NOTE — ED Notes (Addendum)
Pt denies chest pain at this time; given [place] sandwich, crackers with peanut butter, and water; informed pt of NPO status at midnight for possible cardiac cath vs stress test in the morning. Pt reports last stent was in [place] in 2015.

## 2015-06-10 NOTE — ED Notes (Signed)
Pt c/o tightness to left chest with numbness to left fingers around 1100. Pt reports that he was out watering the garden and then he went to church.

## 2015-06-10 NOTE — ED Notes (Signed)
Cards at bedside

## 2015-06-10 NOTE — ED Provider Notes (Signed)
CSN: [id number]     Arrival date & time 06/10/15  1548 History   First MD Initiated Contact with Patient 06/10/15 1810     Chief Complaint  Patient presents with  . Chest Pain     (Consider location/radiation/quality/duration/timing/severity/associated sxs/prior Treatment) HPI Patient has a history of MI about 10 years ago. He reports he had 3 stents then. A little over a year ago he was catheterized and had another stent placed. He reports today he was out in the yard just doing light activity and got a tight feeling in his left chest. In association with that his left arm felt a little heavy and numb. The patient reports that he became diaphoretic. The symptoms persisted for several hours and at this point have eased off. At this point he states he no longer has chest pain. He has been well otherwise. He took a daily aspirin. All he knew medication for him as prednisone which she has been taking for a poison ivy rash that is nearly resolved. Past Medical History  Diagnosis Date  . CAD (coronary artery disease)     Stents in [place] 2006, 2007, 2015  . Hyperlipidemia   . HTN (hypertension)    Past Surgical History  Procedure Laterality Date  . Tonsillectomy and adenoidectomy     Family History  Problem Relation Age of Onset  . Hypertension Father   . Dementia Father     Vascular  . Heart disease Maternal Grandfather 15    Died probably of heart diseasse  . Heart disease Maternal Uncle 43    Died of "heart exploding"   Social History  Substance Use Topics  . Smoking status: Never Smoker   . Smokeless tobacco: Never Used  . Alcohol Use: No    Review of Systems 10 Systems reviewed and are negative for acute change except as noted in the HPI.    Allergies  Review of patient's allergies indicates no known allergies.  Home Medications   Prior to Admission medications   Medication Sig Start Date End Date Taking? Authorizing Provider  aspirin 81 MG tablet Take 81 mg by mouth  daily.    Historical Provider, MD  atorvastatin (LIPITOR) 80 MG tablet Take 1 tablet (80 mg total) by mouth daily. 03/22/15   Marletta Lor, MD  Cholecalciferol (VITAMIN D3) 2000 UNITS TABS Take 1 tablet by mouth daily.    Historical Provider, MD  CHROMIUM-CINNAMON PO Take 2,000 mg by mouth daily.    Historical Provider, MD  Coenzyme Q10 (CO Q 10) 100 MG CAPS Take 2 capsules by mouth daily.    Historical Provider, MD  lisinopril (PRINIVIL,ZESTRIL) 5 MG tablet Take 1 tablet (5 mg total) by mouth daily. 03/22/15   Marletta Lor, MD  metoprolol succinate (TOPROL-XL) 25 MG 24 hr tablet Take 1 tablet (25 mg total) by mouth daily. 03/22/15   Marletta Lor, MD  Multiple Vitamins-Minerals (EMERGEN-C VITAMIN C) PACK Take 1 Package by mouth daily.    Historical Provider, MD  Omega 3-6-9 Fatty Acids (OMEGA 3-6-9 PO) Take 3,200 mg by mouth daily.    Historical Provider, MD  predniSONE (DELTASONE) 20 MG tablet Take 1 tablet (20 mg total) by mouth daily with breakfast. 06/06/15   Dorothyann Peng, NP  ticagrelor (BRILINTA) 90 MG TABS tablet Take 1 tablet (90 mg total) by mouth daily. 03/22/15   Marletta Lor, MD   BP 113/61 mmHg  Pulse 64  Temp(Src) 97.4 F (36.3 C) (Oral)  Resp  15  Ht 5' 6.5" (1.689 m)  Wt 195 lb (88.451 kg)  BMI 31.01 kg/m2  SpO2 99% Physical Exam  Constitutional: He is oriented to person, place, and time. He appears well-developed and well-nourished.  HENT:  Head: Normocephalic and atraumatic.  Eyes: EOM are normal. Pupils are equal, round, and reactive to light.  Neck: Neck supple.  Cardiovascular: Normal rate, regular rhythm, normal heart sounds and intact distal pulses.   Pulmonary/Chest: Effort normal and breath sounds normal.  Abdominal: Soft. Bowel sounds are normal. He exhibits no distension. There is no tenderness.  Musculoskeletal: Normal range of motion. He exhibits no edema.  Neurological: He is alert and oriented to person, place, and time. He has  normal strength. Coordination normal. GCS eye subscore is 4. GCS verbal subscore is 5. GCS motor subscore is 6.  Skin: Skin is warm, dry and intact.  Psychiatric: He has a normal mood and affect.    ED Course  Procedures (including critical care time) Labs Review Labs Reviewed  BASIC METABOLIC PANEL - Abnormal; Notable for the following:    Sodium 134 (*)    Chloride 100 (*)    Glucose, Bld 193 (*)    Calcium 8.7 (*)    All other components within normal limits  CBC  TROPONIN I  I-STAT TROPOININ, ED    Imaging Review Dg Chest 2 View  06/10/2015   CLINICAL DATA:  Left-sided chest tightness with diaphoresis and tingling in the left hand for 1 day. History of myocardial infarction and hypertension. Initial encounter.  EXAM: CHEST  2 VIEW  COMPARISON:  None.  FINDINGS: The heart size and mediastinal contours are normal. The lungs are clear. There is no pleural effusion or pneumothorax. No acute osseous findings are identified. Thoracic paraspinal osteophytes noted.  IMPRESSION: No active cardiopulmonary process.   Electronically Signed   By: Richardean Sale M.D.   On: 06/10/2015 16:52   I have personally reviewed and evaluated these images and lab results as part of my medical decision-making.   EKG Interpretation   Date/Time:  Sunday June 10 2015 15:52:29 EDT Ventricular Rate:  68 PR Interval:  122 QRS Duration: 90 QT Interval:  414 QTC Calculation: 440 R Axis:   18 Text Interpretation:  Normal sinus rhythm Normal ECG Confirmed by  Johnney Killian, MD, Jeannie Done [phone]) on 06/10/2015 6:34:15 PM     Consult: [place] patient's case is reviewed with Dr. Wynonia Lawman who will evaluate the patient in the emergency department. MDM   Final diagnoses:  Coronary artery disease involving native coronary artery of native heart with angina pectoris   Patient has known history of coronary artery disease with stents in place. He has been chest pain-free for approximately a year. Today he experienced  chest pain with slight outdoor exertion. In association with this was left arm discomfort and diaphoresis. The patient will be evaluated by cardiology in the emergency department for final disposition. The patient does not have acute EKG changes and is chest pain-free at this time.    Charlesetta Shanks, MD 06/10/15 [phone]

## 2015-06-11 ENCOUNTER — Inpatient Hospital Stay (HOSPITAL_COMMUNITY)

## 2015-06-11 ENCOUNTER — Encounter (HOSPITAL_COMMUNITY): Payer: Self-pay | Admitting: Physician Assistant

## 2015-06-11 DIAGNOSIS — E669 Obesity, unspecified: Secondary | ICD-10-CM | POA: Insufficient documentation

## 2015-06-11 DIAGNOSIS — I200 Unstable angina: Secondary | ICD-10-CM

## 2015-06-11 DIAGNOSIS — R079 Chest pain, unspecified: Secondary | ICD-10-CM

## 2015-06-11 LAB — COMPREHENSIVE METABOLIC PANEL
ALT: 30 U/L (ref 17–63)
AST: 24 U/L (ref 15–41)
Albumin: 3.9 g/dL (ref 3.5–5.0)
Alkaline Phosphatase: 65 U/L (ref 38–126)
Anion gap: 8 (ref 5–15)
BUN: 15 mg/dL (ref 6–20)
CO2: 28 mmol/L (ref 22–32)
Calcium: 8.7 mg/dL — ABNORMAL LOW (ref 8.9–10.3)
Chloride: 100 mmol/L — ABNORMAL LOW (ref 101–111)
Creatinine, Ser: 0.88 mg/dL (ref 0.61–1.24)
GFR calc Af Amer: >60 mL/min (ref >60)
GFR calc non Af Amer: >60 mL/min (ref >60)
Glucose, Bld: 123 mg/dL — ABNORMAL HIGH (ref 65–99)
Potassium: 3.8 mmol/L (ref 3.5–5.1)
Sodium: 136 mmol/L (ref 135–145)
Total Bilirubin: 1.3 mg/dL — ABNORMAL HIGH (ref 0.3–1.2)
Total Protein: 6.5 g/dL (ref 6.5–8.1)

## 2015-06-11 LAB — LIPID PANEL
Cholesterol: 98 mg/dL (ref 0–200)
HDL: 43 mg/dL (ref >40)
LDL Cholesterol: 43 mg/dL (ref 0–99)
Total CHOL/HDL Ratio: 2.3 RATIO
Triglycerides: 62 mg/dL (ref <150)
VLDL: 12 mg/dL (ref 0–40)

## 2015-06-11 LAB — NM MYOCAR MULTI W/SPECT W/WALL MOTION / EF
Estimated workload: 10.1 METS
Exercise duration (min): 8 min
Exercise duration (sec): 17 s
MPHR: 160 {beats}/min
Peak HR: 160 {beats}/min
Percent HR: 100 %
RPE: 16
Rest HR: 72 {beats}/min

## 2015-06-11 LAB — TROPONIN I
Troponin I: <0.03 ng/mL (ref <0.031)
Troponin I: <0.03 ng/mL (ref <0.031)
Troponin I: <0.03 ng/mL (ref <0.031)

## 2015-06-11 MED ORDER — TECHNETIUM TC 99M SESTAMIBI - CARDIOLITE
30.0000 | Freq: Once | INTRAVENOUS | Status: AC | PRN
  Administered 2015-06-11: 12:00:00 30 via INTRAVENOUS

## 2015-06-11 MED ORDER — NITROGLYCERIN 0.4 MG SL SUBL: 0.4000 mg | SUBLINGUAL_TABLET | SUBLINGUAL | Status: DC | PRN

## 2015-06-11 MED ORDER — REGADENOSON 0.4 MG/5ML IV SOLN
0.4000 mg | Freq: Once | INTRAVENOUS | Status: DC
  Filled 2015-06-11: qty 5

## 2015-06-11 MED ORDER — TECHNETIUM TC 99M SESTAMIBI GENERIC - CARDIOLITE
10.0000 | Freq: Once | INTRAVENOUS | Status: AC | PRN
  Administered 2015-06-11: 10 via INTRAVENOUS

## 2015-06-11 NOTE — Progress Notes (Signed)
Patient ID: Michael [name], male   DOB: 1956-08-26, 59 y.o.   MRN: [id number]    Subjective:  Denies SSCP, palpitations or Dyspnea On steroids for poison ivy  Objective:  Filed Vitals:   06/11/15 0000 06/11/15 0102 06/11/15 0455 06/11/15 0551  BP: 116/62 109/55 109/64 102/58  Pulse: 49 61  59  Temp:      TempSrc:      Resp: 16 16 14 13   Height:      Weight:      SpO2: 94% 95%  97%    Intake/Output from previous day: No intake or output data in the 24 hours ending 06/11/15 0754  Physical Exam: Affect appropriate Healthy:  appears stated age HEENT: normal Neck supple with no adenopathy JVP normal no bruits no thyromegaly Lungs clear with no wheezing and good diaphragmatic motion Heart:  S1/S2 no murmur, no rub, gallop or click PMI normal Abdomen: benighn, BS positve, no tenderness, no AAA no bruit.  No HSM or HJR Distal pulses intact with no bruits No edema Neuro non-focal Skin warm and dry No muscular weakness   Lab Results: Basic Metabolic Panel:  Recent Labs  06/10/15 1605 06/10/15 2321  NA 134* 136  K 4.2 3.8  CL 100* 100*  CO2 26 28  GLUCOSE 193* 123*  BUN 18 15  CREATININE 1.02 0.88  CALCIUM 8.7* 8.7*   Liver Function Tests:  Recent Labs  06/10/15 2321  AST 24  ALT 30  ALKPHOS 65  BILITOT 1.3*  PROT 6.5  ALBUMIN 3.9   CBC:  Recent Labs  06/10/15 1605  WBC 10.3  HGB 14.3  HCT 40.3  MCV 92.0  PLT 253   Cardiac Enzymes:  Recent Labs  06/10/15 1913 06/10/15 2321 06/11/15 0511  TROPONINI <0.03 <0.03 <0.03   Fasting Lipid Panel:  Recent Labs  06/11/15 0511  CHOL 98  HDL 43  LDLCALC 43  TRIG 62  CHOLHDL 2.3    Imaging: Dg Chest 2 View  06/10/2015   CLINICAL DATA:  Left-sided chest tightness with diaphoresis and tingling in the left hand for 1 day. History of myocardial infarction and hypertension. Initial encounter.  EXAM: CHEST  2 VIEW  COMPARISON:  None.  FINDINGS: The heart size and mediastinal contours are  normal. The lungs are clear. There is no pleural effusion or pneumothorax. No acute osseous findings are identified. Thoracic paraspinal osteophytes noted.  IMPRESSION: No active cardiopulmonary process.   Electronically Signed   By: Richardean Sale M.D.   On: 06/10/2015 16:52    Cardiac Studies:  ECG: SR possible old lateral /inf infarct no acute ST changes   Telemetry:  NSR  Echo:   Medications:   . aspirin EC  81 mg Oral Daily  . atorvastatin  80 mg Oral Daily  . enoxaparin (LOVENOX) injection  40 mg Subcutaneous Q24H  . lisinopril  5 mg Oral Daily  . metoprolol succinate  25 mg Oral Daily  . regadenoson  0.4 mg Intravenous Once  . ticagrelor  90 mg Oral BID       Assessment/Plan:  CAD:  This 59 year old male presented to the emergency room with prolonged left-sided chest discomfort and sweating that lasted about 4 hours. He has a history of coronary artery disease with a stent placed to the circumflex in the setting of an infarction in 2006 later had a stent to the ostium of the right coronary artery in 2007. He developed exertional burning pain in his mid sternal chest and  had a stent placed to the midportion of the right coronary artery which was a 3.0 x 22 mm resolute drug-eluting stent in [facility] last September  Pain is atypical and not like his previous ANGINA  Will order exercise myovue for today  HTN:  Well controlled.  Continue current medications and low sodium Dash type diet.    Chol:  On statin    Jenkins Rouge 06/11/2015, 7:54 AM

## 2015-06-11 NOTE — Progress Notes (Signed)
Patient presented for [employer]. Tolerated procedure well. Result to follow.   Choua Chalker, [place]

## 2015-06-11 NOTE — Discharge Summary (Signed)
Discharge Summary   Patient ID: Michael [name] MRN: [id number], DOB/AGE: 1956-08-22 59 y.o. Admit date: 06/10/2015 D/C date:     06/11/2015  Primary Cardiologist: Dr. Percival Spanish   Principal Problem:   Chest pain Active Problems:   CAD (coronary artery disease)   Hyperlipidemia   HTN (hypertension)    Admission Dates: 06/10/15-06/08/15 Discharge Diagnosis: chest pain s/p low risk nuclear stress test.   HPI: Michael [name] is a 59 y.o. male with a history of CAD s/p PCI of LCx (2006) and ostium of RCA (2007) as well as DES to RCA in [place] (05/2014), HLD, HTN and obesity who presented to [facility] on 06/10/15 with chest pain.   He recently moved to [facility] and has been seen by Dr. Percival Spanish in 08/2014 to establish care. He was doing well with no symptoms until yesterday after church. He presented to the emergency room with prolonged left-sided chest discomfort and sweating that lasted about 4 hours. The discomfort persisted and he took a nitroglycerin but noted no relief and sought medical evaluation.The discomfort resolved upon admission. He walks on a regular basis and denied recent exertional chest pain. He is currently on prednisone for poison ivy.   Hospital Course  ION:GEXB was felt to be atypical and not like his previous angina and he was set up for an exercise myoview. This returned low risk; EF 51%. (see report below) -- Troponin neg x4 and ECG with no acute ST or TW changes  -- Continue ASA/Brilinta, atorvastatin and Toprol XL   HTN: Well controlled on Toprol XL 25mg  and lisinopril 5mg . Continue current medications and low sodium Dash type diet.   HLD: LDL 43. Continue statin   The patient has had an uncomplicated hospital course and is recovering well. He has been seen by Dr. Johnsie Cancel today and deemed ready for discharge home. All follow-up appointments have been scheduled. Discharge  medications are listed below.   Discharge Vitals: Blood pressure 104/56, pulse  72, temperature 97.4 F (36.3 C), temperature source Oral, resp. rate 16, height 5' 6.5" (1.689 m), weight 195 lb (88.451 kg), SpO2 98 %.  Labs: Lab Results  Component Value Date   WBC 10.3 06/10/2015   HGB 14.3 06/10/2015   HCT 40.3 06/10/2015   MCV 92.0 06/10/2015   PLT 253 06/10/2015     Recent Labs Lab 06/10/15 2321  NA 136  K 3.8  CL 100*  CO2 28  BUN 15  CREATININE 0.88  CALCIUM 8.7*  PROT 6.5  BILITOT 1.3*  ALKPHOS 65  ALT 30  AST 24  GLUCOSE 123*    Recent Labs  06/10/15 1913 06/10/15 2321 06/11/15 0511 06/11/15 1223  TROPONINI <0.03 <0.03 <0.03 <0.03   Lab Results  Component Value Date   CHOL 98 06/11/2015   HDL 43 06/11/2015   LDLCALC 43 06/11/2015   TRIG 62 06/11/2015     Diagnostic Studies/Procedures   Dg Chest 2 View  06/10/2015   CLINICAL DATA:  Left-sided chest tightness with diaphoresis and tingling in the left hand for 1 day. History of myocardial infarction and hypertension. Initial encounter.  EXAM: CHEST  2 VIEW  COMPARISON:  None.  FINDINGS: The heart size and mediastinal contours are normal. The lungs are clear. There is no pleural effusion or pneumothorax. No acute osseous findings are identified. Thoracic paraspinal osteophytes noted.  IMPRESSION: No active cardiopulmonary process.   Electronically Signed   By: Richardean Sale M.D.   On: 06/10/2015 16:52   Nm  Myocar Multi W/spect W/wall Motion / Ef  06/11/2015   CLINICAL DATA:  Prolonged left-sided chest discomfort and sweating that lasted 4 hr. History of coronary artery disease.  EXAM: MYOCARDIAL IMAGING WITH SPECT (REST AND PHARMACOLOGIC-STRESS)  GATED LEFT VENTRICULAR WALL MOTION STUDY  LEFT VENTRICULAR EJECTION FRACTION  TECHNIQUE: Standard myocardial SPECT imaging was performed after resting intravenous injection of 10 mCi Tc-10m sestamibi. Subsequently, intravenous infusion of Lexiscan was performed under the supervision of the Cardiology staff. At peak effect of the drug, 30 mCi  Tc-46m sestamibi was injected intravenously and standard myocardial SPECT imaging was performed. Quantitative gated imaging was also performed to evaluate left ventricular wall motion, and estimate left ventricular ejection fraction.  COMPARISON:  Chest radiograph 06/10/2015  FINDINGS: Perfusion: Large fixed defect involving the lateral and inferolateral walls. Heart orientation is slightly different between the rest and stress images. Cannot exclude an area of reversibility along the lateral apex but this could be related to differences in heart orientation. Another questionable area of reversibility in the mid segment anteroseptal wall.  Wall Motion: No significant wall motion abnormality. There may be minimal hypokinesia along the inferolateral wall.  Left Ventricular Ejection Fraction: 51 %  End diastolic volume 846 ml  End systolic volume 57 ml  IMPRESSION: 1. Large fixed defect involving the lateral and inferolateral walls without significant wall motion abnormality. Study appears to have technical limitations due to orientation of the heart between the rest and stress images. Areas of possible reversibility along the lateral apex and anteroseptal wall are equivocal.  2. No significant wall motion abnormality despite a large fixed defect involving the lateral and inferolateral walls.  3. Left ventricular ejection fraction is 51%.  4. Low-risk stress test findings*.  *2012 Appropriate Use Criteria for Coronary Revascularization Focused Update: J Am Coll Cardiol. 6599;35(7):017-793. http://content.airportbarriers.com.aspx?articleid=1201161   Electronically Signed   By: Markus Daft M.D.   On: 06/11/2015 13:08    Discharge Medications     Medication List    TAKE these medications        aspirin 81 MG tablet  Take 81 mg by mouth daily.     atorvastatin 80 MG tablet  Commonly known as:  LIPITOR  Take 1 tablet (80 mg total) by mouth daily.     Cinnamon 500 MG capsule  Take 500 mg by mouth daily.       EMERGEN-C VITAMIN C Pack  Take 1 Package by mouth daily.     lisinopril 5 MG tablet  Commonly known as:  PRINIVIL,ZESTRIL  Take 1 tablet (5 mg total) by mouth daily.     metoprolol succinate 25 MG 24 hr tablet  Commonly known as:  TOPROL-XL  Take 1 tablet (25 mg total) by mouth daily.     nitroGLYCERIN 0.4 MG SL tablet  Commonly known as:  NITROSTAT  Place 1 tablet (0.4 mg total) under the tongue every 5 (five) minutes x 3 doses as needed for chest pain.     Omega 3-6-9 Caps  Take 3,200 mg by mouth daily.     predniSONE 20 MG tablet  Commonly known as:  DELTASONE  Take 1 tablet (20 mg total) by mouth daily with breakfast.     ticagrelor 90 MG Tabs tablet  Commonly known as:  BRILINTA  Take 1 tablet (90 mg total) by mouth daily.        Disposition   The patient will be discharged in stable condition to home.  Follow-up Information    Follow up  with [facility] On 06/19/2015.   Specialty:  Cardiology   Why:  @ 10:30 am with Rosaria Ferries PA-C   Contact information:   [address] [place] [phone]        Duration of Discharge Encounter: Greater than 30 minutes including physician and PA time.  Mable Fill R PA-C 06/11/2015, 2:34 PM   Patient examined chart reviewed see separate progress note  Myovue low risk pain atypical and r/o F/u with Dr Shelda Altes

## 2015-06-11 NOTE — Discharge Instructions (Signed)

## 2015-06-11 NOTE — ED Notes (Signed)
Spoke with Cardiology PA stated patient can eat and will speak with Cardiologist and possibly be discharged from the ED.

## 2015-06-11 NOTE — ED Notes (Signed)
Pt still in Nuclear medicine

## 2015-06-11 NOTE — ED Notes (Signed)
Provider at bedside

## 2015-06-11 NOTE — ED Provider Notes (Signed)
ECG interpretation  Date: 06/11/2015  Rate: 52  Rhythm: normal sinus rhythm  QRS Axis: normal  Intervals: normal  ST/T Wave abnormalities: nonspecific inferior T waves normal  Conduction Disutrbances: none  Narrative Interpretation:   Old EKG Reviewed: No significant changes noted     Jola Schmidt, MD 06/11/15 [phone]

## 2015-06-11 NOTE — ED Notes (Signed)
Patient transported to nuclear med.

## 2015-06-11 NOTE — ED Notes (Signed)
Patient not in room currently in NM

## 2015-06-12 ENCOUNTER — Telehealth: Payer: Self-pay | Admitting: Cardiology

## 2015-06-12 NOTE — Telephone Encounter (Signed)
Patient contacted regarding discharge from  [facility] on 06/11/15.  Patient understands to follow up with provider RHONDA BARRETTon 06/19/15 at 10:30 at [place]. Patient understands discharge instructions? yes  Patient understands medications and regiment? yes  Patient understands to bring all medications to this visit? yes

## 2015-06-12 NOTE — Telephone Encounter (Signed)
D/c phone call .[name] Appt on 06/19/15 at 10:30 w/ Rosaria Ferries at [facility] office   Thanks

## 2015-06-19 ENCOUNTER — Ambulatory Visit (INDEPENDENT_AMBULATORY_CARE_PROVIDER_SITE_OTHER): Admitting: Physician Assistant

## 2015-06-19 ENCOUNTER — Encounter: Payer: Self-pay | Admitting: Physician Assistant

## 2015-06-19 VITALS — BP 112/72 | HR 78 | Ht 66.5 in | Wt 198.3 lb

## 2015-06-19 DIAGNOSIS — R072 Precordial pain: Secondary | ICD-10-CM

## 2015-06-19 NOTE — Patient Instructions (Signed)
Your physician recommends that you schedule a follow-up appointment in: 3 Months with Dr Percival Spanish

## 2015-06-19 NOTE — Progress Notes (Signed)
Cardiology Office Note   Date:  06/19/2015   ID:  Michael [name], DOB 09-12-1956, MRN [id number]  PCP:  Nyoka Cowden, MD  Cardiologist:  Dr Mardene Celeste, PA-C   Chief Complaint  Patient presents with  . ER follow-up    patient went to ER ~2 weeks ago for chest pain, patient reports no chest pain since leaving ER.    History of Present Illness: Michael [name] is a 59 y.o. male with a history of PCI of LCx (2006) and ostium of RCA (2007) as well as DES to RCA in [place] (05/2014), HLD, HTN and obesity. He was recently admitted to [facility] for chest pain and had a low risk nuclear stress test.  Michael [name] presents for post hospital follow-up.  Mr. Rounds has done well since discharge from the hospital. He has not had any chest pain. He has been active around the house and yard and has not had any problems with that. He is compliant with all of his medications. He denies new dyspnea on exertion or lower extremity edema.  He feels reassured that he is doing well and generally better about things.  Past Medical History  Diagnosis Date  . CAD (coronary artery disease)     a. s/p PCI of LCx (2006) and ostium of RCA (2007) as well as DES to RCA in [place] (05/2014)  . Hyperlipidemia   . HTN (hypertension)   . Obesity     Past Surgical History  Procedure Laterality Date  . Tonsillectomy and adenoidectomy     . Coronary angioplasty with stent placement  2006    in [place], occluded CFX, s/p Taxus stent  . Coronary angioplasty with stent placement  2007    in [place], ostial RCA stent  . Coronary angioplasty with stent placement  2015    in [place], RCA stent    Current Outpatient Prescriptions  Medication Sig Dispense Refill  . aspirin 81 MG tablet Take 81 mg by mouth daily.    [name] atorvastatin (LIPITOR) 80 MG tablet Take 1 tablet (80 mg total) by mouth daily. (Patient taking differently: Take 80 mg by mouth daily  at 6 PM. ) 90 tablet 3  . Cinnamon 500 MG capsule Take 500 mg by mouth daily.    [name] lisinopril (PRINIVIL,ZESTRIL) 5 MG tablet Take 1 tablet (5 mg total) by mouth daily. 90 tablet 3  . metoprolol succinate (TOPROL-XL) 25 MG 24 hr tablet Take 1 tablet (25 mg total) by mouth daily. 90 tablet 3  . Multiple Vitamins-Minerals (EMERGEN-C VITAMIN C) PACK Take 1 Package by mouth daily.    . nitroGLYCERIN (NITROSTAT) 0.4 MG SL tablet Place 1 tablet (0.4 mg total) under the tongue every 5 (five) minutes x 3 doses as needed for chest pain. 25 tablet 12  . Omega 3-6-9 CAPS Take 3,200 mg by mouth daily.    . predniSONE (DELTASONE) 20 MG tablet Take 1 tablet (20 mg total) by mouth daily with breakfast. 16 tablet 0  . ticagrelor (BRILINTA) 90 MG TABS tablet Take 1 tablet (90 mg total) by mouth daily. (Patient taking differently: Take 90 mg by mouth 2 (two) times daily. ) 30 tablet 5   No current facility-administered medications for this visit.    Allergies:   Review of patient's allergies indicates no known allergies.    Social History:  The patient  reports that he has never smoked. He has never used smokeless tobacco. He reports that he does  not drink alcohol or use illicit drugs.   Family History:  The patient's family history includes Dementia in his father; Heart disease (age of onset: 87) in his maternal uncle; Heart disease (age of onset: 53) in his maternal grandfather; Hypertension in his father.    ROS:  Please see the history of present illness. All other systems are reviewed and negative.    PHYSICAL EXAM: VS:  BP 112/72 mmHg  Pulse 78  Ht 5' 6.5" (1.689 m)  Wt 198 lb 4.8 oz (89.948 kg)  BMI 31.53 kg/m2 , BMI Body mass index is 31.53 kg/(m^2). GEN: Well nourished, well developed, male in no acute distress HEENT: normal Neck: no JVD, carotid bruits, or masses Cardiac: RRR; no murmurs, rubs, or gallops, no edema  Respiratory:  clear to auscultation bilaterally, normal work of  breathing GI: soft, nontender, nondistended, + BS MS: no deformity or atrophy; distal pulses 2+ in all 4 extremities Skin: warm and dry, no rash Neuro:  Strength and sensation are intact Psych: euthymic mood, full affect   EKG:  EKG is not ordered today.   Recent Labs: 06/10/2015: ALT 30; BUN 15; Creatinine, Ser 0.88; Hemoglobin 14.3; Platelets 253; Potassium 3.8; Sodium 136    Lipid Panel    Component Value Date/Time   CHOL 98 06/11/2015 0511   TRIG 62 06/11/2015 0511   HDL 43 06/11/2015 0511   CHOLHDL 2.3 06/11/2015 0511   VLDL 12 06/11/2015 0511   LDLCALC 43 06/11/2015 0511     Wt Readings from Last 3 Encounters:  06/19/15 198 lb 4.8 oz (89.948 kg)  06/10/15 195 lb (88.451 kg)  06/06/15 198 lb 1.6 oz (89.858 kg)     Other studies Reviewed: Additional studies/ records that were reviewed today include: Hospital records, previous office notes and ECGs.  ASSESSMENT AND PLAN:  1.  Chest pain: He has a history of CAD with stenting in 2006, 2007, and 2015. All procedures were performed in [place]. A recent Myoview was low risk. He is encouraged to be aggressive about cardiac risk factor reduction. He is to continue aspirin, statin, beta blocker and Brilinta. He is also on an ACE inhibitor. No additional workup is indicated at this time. He is to follow-up in 3 months with Dr. Percival Spanish and let us know in the meantime if he has any additional episodes of chest pain.   Current medici are reviewed at length with the patient today.  The patient does not have concerns regarding medicines.  The following changes have been made:  no change  Labs/ tests ordered today include:   Orders Placed This Encounter  Procedures  . EKG 12-Lead     Disposition:   FU with Dr. Percival Spanish in 3 months   Signed, Lenoard Aden  06/19/2015 4:38 PM    [address], [place], [place]  [address] Phone: [phone]; Fax: [phone]

## 2015-07-18 ENCOUNTER — Other Ambulatory Visit: Payer: Self-pay | Admitting: *Deleted

## 2015-07-18 MED ORDER — ATORVASTATIN CALCIUM 80 MG PO TABS: 80.0000 mg | ORAL_TABLET | Freq: Every day | ORAL | Status: DC

## 2015-07-18 MED ORDER — LISINOPRIL 5 MG PO TABS: 5.0000 mg | ORAL_TABLET | Freq: Every day | ORAL | Status: DC

## 2015-07-18 MED ORDER — METOPROLOL SUCCINATE ER 25 MG PO TB24: 25.0000 mg | ORAL_TABLET | Freq: Every day | ORAL | Status: DC

## 2015-07-18 MED ORDER — TICAGRELOR 90 MG PO TABS: 90.0000 mg | ORAL_TABLET | Freq: Every day | ORAL | Status: DC

## 2015-08-29 ENCOUNTER — Encounter: Payer: Self-pay | Admitting: Cardiology

## 2015-08-29 ENCOUNTER — Ambulatory Visit (INDEPENDENT_AMBULATORY_CARE_PROVIDER_SITE_OTHER): Payer: 59 | Admitting: Cardiology

## 2015-08-29 VITALS — BP 108/68 | HR 84 | Ht 66.5 in | Wt 197.9 lb

## 2015-08-29 DIAGNOSIS — I10 Essential (primary) hypertension: Secondary | ICD-10-CM

## 2015-08-29 DIAGNOSIS — I2510 Atherosclerotic heart disease of native coronary artery without angina pectoris: Secondary | ICD-10-CM

## 2015-08-29 NOTE — Progress Notes (Signed)
HPI The patient presents for evaluation of coronary disease. He moved from [place]. A cath done in [place] before he moved demonstrated a right coronary lesion that was treated with a drug-eluting stent. Since I was saw him he was in [facility] with chest pain.  He ruled out and had a low risk stress perfusion study.  His EF was 51%. There is a large fixed defect involving the lateral and inferolateral wall. There was questionable reversibility along the lateral apex and anteroseptal wall.  I reviewed the hospital records.    Since that hospitalization he's had no recurrent chest discomfort. He's been walking and doing yard work.  The patient denies any new symptoms such as chest discomfort, neck or arm discomfort. There has been no new shortness of breath, PND or orthopnea. There have been no reported palpitations, presyncope or syncope.  No Known Allergies  Current Outpatient Prescriptions  Medication Sig Dispense Refill  . aspirin 81 MG tablet Take 81 mg by mouth daily.    [name] atorvastatin (LIPITOR) 80 MG tablet Take 1 tablet (80 mg total) by mouth daily. 90 tablet 1  . Cinnamon 500 MG capsule Take 500 mg by mouth daily.    [name] lisinopril (PRINIVIL,ZESTRIL) 5 MG tablet Take 1 tablet (5 mg total) by mouth daily. 90 tablet 1  . metoprolol succinate (TOPROL-XL) 25 MG 24 hr tablet Take 1 tablet (25 mg total) by mouth daily. 90 tablet 1  . Multiple Vitamins-Minerals (EMERGEN-C VITAMIN C) PACK Take 1 Package by mouth daily.    . nitroGLYCERIN (NITROSTAT) 0.4 MG SL tablet Place 1 tablet (0.4 mg total) under the tongue every 5 (five) minutes x 3 doses as needed for chest pain. 25 tablet 12  . Omega 3-6-9 CAPS Take 3,200 mg by mouth daily.    . ticagrelor (BRILINTA) 90 MG TABS tablet Take 1 tablet (90 mg total) by mouth daily. 90 tablet 1   No current facility-administered medications for this visit.    Past Medical History  Diagnosis Date  . CAD (coronary artery disease)     a. s/p PCI of LCx (2006)  and ostium of RCA (2007) as well as DES to RCA in [place] (05/2014)  . Hyperlipidemia   . HTN (hypertension)   . Obesity     Past Surgical History  Procedure Laterality Date  . Tonsillectomy and adenoidectomy     . Coronary angioplasty with stent placement  2006    in [place], occluded CFX, s/p Taxus stent  . Coronary angioplasty with stent placement  2007    in [place], ostial RCA stent  . Coronary angioplasty with stent placement  2015    in [place], RCA stent    ROS:    Positive for joint pain.  Otherwise as stated in the HPI and negative for all other systems.  PHYSICAL EXAM BP 108/68 mmHg  Pulse 84  Ht 5' 6.5" (1.689 m)  Wt 197 lb 14.4 oz (89.767 kg)  BMI 31.47 kg/m2  GENERAL:  Well appearing NECK:  No jugular venous distention, waveform within normal limits, carotid upstroke brisk and symmetric, no bruits, no thyromegaly LUNGS:  Clear to auscultation bilaterally CHEST:  Unremarkable HEART:  PMI not displaced or sustained,S1 and S2 within normal limits, no S3, no S4, no clicks, no rubs, no murmurs ABD:  Flat, positive bowel sounds normal in frequency in pitch, no bruits, no rebound, no guarding, no midline pulsatile mass, no hepatomegaly, no splenomegaly EXT:  2 plus pulses throughout, no edema,  no cyanosis no clubbing SKIN:  No rashes no nodules    ASSESSMENT AND PLAN   CAD:  The patient has no new sypmtoms.  I did review the catheterization from [place] with him as he has questions about the risk of stopping Brilinta.  The lesion was most recently treated was a de novo lesion not an in-stent restenosis. There is no significant benefit to continuing DAPT.  He will stop his Brilinta  DYSLIPIDEMIA:    His LDL in September was 70 with an HDL of 43.  He will continue the meds as listed.  EXERCISE:  We talked specifically about an exercise regimen. He will try to get back to the [facility].

## 2015-08-29 NOTE — Patient Instructions (Signed)
Your physician wants you to follow-up in: 6 Months. You will receive a reminder letter in the mail two months in advance. If you don't receive a letter, please call our office to schedule the follow-up appointment.  Your physician has recommended you make the following change in your medication: STOP Brilinta  Merry Christmas and [place]!!

## 2015-11-22 ENCOUNTER — Other Ambulatory Visit: Payer: Self-pay | Admitting: Internal Medicine

## 2015-11-22 MED ORDER — LISINOPRIL 5 MG PO TABS: 5.0000 mg | ORAL_TABLET | Freq: Every day | ORAL | Status: DC

## 2015-11-22 MED ORDER — METOPROLOL SUCCINATE ER 25 MG PO TB24: 25.0000 mg | ORAL_TABLET | Freq: Every day | ORAL | Status: DC

## 2015-11-22 MED ORDER — ATORVASTATIN CALCIUM 80 MG PO TABS: 80.0000 mg | ORAL_TABLET | Freq: Every day | ORAL | Status: DC

## 2016-05-05 ENCOUNTER — Inpatient Hospital Stay (HOSPITAL_COMMUNITY)
Admission: EM | Admit: 2016-05-05 | Discharge: 2016-05-07 | DRG: 247 | Disposition: A | Discharge status: Home / Self Care | Attending: Cardiology | Admitting: Cardiology

## 2016-05-05 ENCOUNTER — Encounter (HOSPITAL_COMMUNITY): Payer: Self-pay | Admitting: Emergency Medicine

## 2016-05-05 ENCOUNTER — Emergency Department (HOSPITAL_COMMUNITY)

## 2016-05-05 DIAGNOSIS — T463X5A Adverse effect of coronary vasodilators, initial encounter: Secondary | ICD-10-CM | POA: Diagnosis not present

## 2016-05-05 DIAGNOSIS — Z955 Presence of coronary angioplasty implant and graft: Secondary | ICD-10-CM

## 2016-05-05 DIAGNOSIS — I252 Old myocardial infarction: Secondary | ICD-10-CM | POA: Diagnosis not present

## 2016-05-05 DIAGNOSIS — Z8249 Family history of ischemic heart disease and other diseases of the circulatory system: Secondary | ICD-10-CM | POA: Diagnosis not present

## 2016-05-05 DIAGNOSIS — I25110 Atherosclerotic heart disease of native coronary artery with unstable angina pectoris: Principal | ICD-10-CM | POA: Diagnosis present

## 2016-05-05 DIAGNOSIS — R739 Hyperglycemia, unspecified: Secondary | ICD-10-CM | POA: Diagnosis present

## 2016-05-05 DIAGNOSIS — R079 Chest pain, unspecified: Secondary | ICD-10-CM | POA: Diagnosis not present

## 2016-05-05 DIAGNOSIS — I952 Hypotension due to drugs: Secondary | ICD-10-CM | POA: Diagnosis not present

## 2016-05-05 DIAGNOSIS — I959 Hypotension, unspecified: Secondary | ICD-10-CM

## 2016-05-05 DIAGNOSIS — I10 Essential (primary) hypertension: Secondary | ICD-10-CM | POA: Diagnosis not present

## 2016-05-05 DIAGNOSIS — I2510 Atherosclerotic heart disease of native coronary artery without angina pectoris: Secondary | ICD-10-CM | POA: Diagnosis not present

## 2016-05-05 DIAGNOSIS — Z6830 Body mass index (BMI) 30.0-30.9, adult: Secondary | ICD-10-CM | POA: Diagnosis not present

## 2016-05-05 DIAGNOSIS — E669 Obesity, unspecified: Secondary | ICD-10-CM | POA: Diagnosis present

## 2016-05-05 DIAGNOSIS — I200 Unstable angina: Secondary | ICD-10-CM | POA: Diagnosis present

## 2016-05-05 DIAGNOSIS — E785 Hyperlipidemia, unspecified: Secondary | ICD-10-CM | POA: Diagnosis present

## 2016-05-05 DIAGNOSIS — Y92239 Unspecified place in hospital as the place of occurrence of the external cause: Secondary | ICD-10-CM | POA: Diagnosis not present

## 2016-05-05 HISTORY — DX: Coronary angioplasty status: Z98.61

## 2016-05-05 HISTORY — DX: Atherosclerotic heart disease of native coronary artery without angina pectoris: I25.10

## 2016-05-05 LAB — BASIC METABOLIC PANEL
Anion gap: 8 (ref 5–15)
BUN: 17 mg/dL (ref 6–20)
CO2: 25 mmol/L (ref 22–32)
Calcium: 9.4 mg/dL (ref 8.9–10.3)
Chloride: 105 mmol/L (ref 101–111)
Creatinine, Ser: 0.9 mg/dL (ref 0.61–1.24)
GFR calc Af Amer: >60 mL/min (ref >60)
GFR calc non Af Amer: >60 mL/min (ref >60)
Glucose, Bld: 102 mg/dL — ABNORMAL HIGH (ref 65–99)
Potassium: 3.8 mmol/L (ref 3.5–5.1)
Sodium: 138 mmol/L (ref 135–145)

## 2016-05-05 LAB — I-STAT TROPONIN, ED
Troponin i, poc: 0.01 ng/mL (ref 0.00–0.08)
Troponin i, poc: 0.01 ng/mL (ref 0.00–0.08)

## 2016-05-05 LAB — TROPONIN I: Troponin I: <0.03 ng/mL (ref <0.03)

## 2016-05-05 LAB — CBC
HCT: 42.3 % (ref 39.0–52.0)
HCT: 41.2 % (ref 39.0–52.0)
Hemoglobin: 14.8 g/dL (ref 13.0–17.0)
Hemoglobin: 14.3 g/dL (ref 13.0–17.0)
MCH: 32.2 pg (ref 26.0–34.0)
MCH: 31.6 pg (ref 26.0–34.0)
MCHC: 35 g/dL (ref 30.0–36.0)
MCHC: 34.7 g/dL (ref 30.0–36.0)
MCV: 92.2 fL (ref 78.0–100.0)
MCV: 91.2 fL (ref 78.0–100.0)
Platelets: 219 10*3/uL (ref 150–400)
Platelets: 189 10*3/uL (ref 150–400)
RBC: 4.59 MIL/uL (ref 4.22–5.81)
RBC: 4.52 MIL/uL (ref 4.22–5.81)
RDW: 12.4 % (ref 11.5–15.5)
RDW: 12.3 % (ref 11.5–15.5)
WBC: 6.9 10*3/uL (ref 4.0–10.5)
WBC: 7.9 10*3/uL (ref 4.0–10.5)

## 2016-05-05 LAB — CREATININE, SERUM
Creatinine, Ser: 0.98 mg/dL (ref 0.61–1.24)
GFR calc Af Amer: >60 mL/min (ref >60)
GFR calc non Af Amer: >60 mL/min (ref >60)

## 2016-05-05 MED ORDER — SODIUM CHLORIDE 0.9% FLUSH: 3.0000 mL | INTRAVENOUS | Status: DC | PRN

## 2016-05-05 MED ORDER — ZOLPIDEM TARTRATE 5 MG PO TABS
5.0000 mg | ORAL_TABLET | Freq: Every evening | ORAL | Status: DC | PRN
  Administered 2016-05-07: 5 mg via ORAL
  Filled 2016-05-05: qty 1

## 2016-05-05 MED ORDER — SODIUM CHLORIDE 0.9% FLUSH
3.0000 mL | Freq: Two times a day (BID) | INTRAVENOUS | Status: DC
  Administered 2016-05-05: 3 mL via INTRAVENOUS

## 2016-05-05 MED ORDER — SODIUM CHLORIDE 0.9 % WEIGHT BASED INFUSION
3.0000 mL/kg/h | INTRAVENOUS | Status: DC
  Administered 2016-05-06: 3 mL/kg/h via INTRAVENOUS

## 2016-05-05 MED ORDER — SODIUM CHLORIDE 0.9 % IV SOLN: 250.0000 mL | INTRAVENOUS | Status: DC | PRN

## 2016-05-05 MED ORDER — SODIUM CHLORIDE 0.9 % WEIGHT BASED INFUSION: 1.0000 mL/kg/h | INTRAVENOUS | Status: DC

## 2016-05-05 MED ORDER — NITROGLYCERIN 0.4 MG SL SUBL: 0.4000 mg | SUBLINGUAL_TABLET | SUBLINGUAL | Status: DC | PRN

## 2016-05-05 MED ORDER — ACETAMINOPHEN 325 MG PO TABS: 650.0000 mg | ORAL_TABLET | ORAL | Status: DC | PRN

## 2016-05-05 MED ORDER — ASPIRIN 81 MG PO CHEW: 324.0000 mg | CHEWABLE_TABLET | ORAL | Status: DC

## 2016-05-05 MED ORDER — NITROGLYCERIN IN D5W 200-5 MCG/ML-% IV SOLN
5.0000 ug/min | INTRAVENOUS | Status: DC
  Administered 2016-05-05: 5 ug/min via INTRAVENOUS
  Filled 2016-05-05: qty 250

## 2016-05-05 MED ORDER — ONDANSETRON HCL 4 MG/2ML IJ SOLN
4.0000 mg | Freq: Four times a day (QID) | INTRAMUSCULAR | Status: DC | PRN
Start: 2016-05-05 — End: 2016-05-07

## 2016-05-05 MED ORDER — ASPIRIN EC 81 MG PO TBEC
81.0000 mg | DELAYED_RELEASE_TABLET | Freq: Every day | ORAL | Status: DC
  Administered 2016-05-06 – 2016-05-07 (×2): 81 mg via ORAL
  Filled 2016-05-05 (×3): qty 1

## 2016-05-05 MED ORDER — ASPIRIN 300 MG RE SUPP: 300.0000 mg | RECTAL | Status: DC

## 2016-05-05 MED ORDER — ALPRAZOLAM 0.25 MG PO TABS: 0.2500 mg | ORAL_TABLET | Freq: Two times a day (BID) | ORAL | Status: DC | PRN

## 2016-05-05 MED ORDER — ENOXAPARIN SODIUM 40 MG/0.4ML ~~LOC~~ SOLN
40.0000 mg | Freq: Every day | SUBCUTANEOUS | Status: DC
  Administered 2016-05-05: 40 mg via SUBCUTANEOUS
  Filled 2016-05-05: qty 0.4

## 2016-05-05 NOTE — H&P (Addendum)
Admit date: 05/05/2016 Primary Physician  Nyoka Cowden, MD Primary Cardiologist  Dr. Percival Spanish  CC: Chest pain  HPI: 60 year old male with 3 previous stent placements, circumflex  in 2006, ostium of RCA in 2007, DES to RCA in 2016 all in [place] with hypertension, hyperlipidemia, obesity who presented to the emergency room after a few hours of chest discomfort. This began at 9 AM in the morning with rest and was left-sided with some left arm discomfort. He did not have any nitroglycerin to take. At about 12 PM noon he became short of breath, difficult to take in deep breath. This concerned him. At 4 PM the pain eventually subsided. He decided to come in for further evaluation. He is an extensive heart history as above and also started a new job today as well as. In 2 weeks his son gets married.   2 sets of troponin have been normal. EKG does not show any evidence of significant T wave or ST changes.  Nuclear stress test in 05/2015  1 year ago showed no evidence of ischemia. He did however have a large inferior lateral scar.  No syncope, bleeding, orthopnea, PND, rashes, fevers.  At times he feels tenderness to the touch over left axillary line which in the past has been described as MSK. He wonders if this is a cardiac symptom however.   Wife present.  PMH:   Past Medical History:  Diagnosis Date  . CAD (coronary artery disease)    a. s/p PCI of LCx (2006) and ostium of RCA (2007) as well as DES to RCA in [place] (05/2014)  . HTN (hypertension)   . Hyperlipidemia   . Obesity     PSH:   Past Surgical History:  Procedure Laterality Date  . CORONARY ANGIOPLASTY WITH STENT PLACEMENT  2006   in [place], occluded CFX, s/p Taxus stent  . CORONARY ANGIOPLASTY WITH STENT PLACEMENT  2007   in [place], ostial RCA stent  . CORONARY ANGIOPLASTY WITH STENT PLACEMENT  2015   in [place], RCA stent  . TONSILLECTOMY AND ADENOIDECTOMY      Allergies:   Review of patient's allergies indicates no known allergies. Prior to Admit Meds:   Prior to Admission medications   Medication Sig Start Date End Date Taking? Authorizing Provider  aspirin 81 MG tablet Take 81 mg by mouth daily.    Historical Provider, MD  atorvastatin (LIPITOR) 80 MG tablet Take 1 tablet (80 mg total) by mouth daily. 11/22/15   Marletta Lor, MD  Cinnamon 500 MG capsule Take 500 mg by mouth daily.    Historical Provider, MD  lisinopril (PRINIVIL,ZESTRIL) 5 MG tablet Take 1 tablet (5 mg total) by mouth daily. 11/22/15   Marletta Lor, MD  metoprolol succinate (TOPROL-XL) 25 MG 24 hr tablet Take 1 tablet (25 mg total) by mouth daily. 11/22/15   Marletta Lor, MD  Multiple Vitamins-Minerals (EMERGEN-C VITAMIN C) PACK Take 1 Package by mouth daily.    Historical Provider, MD  nitroGLYCERIN (NITROSTAT) 0.4 MG SL tablet Place 1 tablet (0.4 mg total) under the tongue every 5 (five) minutes x 3 doses as needed for chest pain. 06/11/15   Eileen Stanford, PA-C  Omega 3-6-9 CAPS Take 3,200 mg by mouth daily.    Historical Provider, MD   Fam HX:    Family History  Problem Relation Age of Onset  . Hypertension Father   . Dementia Father     Vascular  . Heart  disease Maternal Grandfather 70    Died probably of heart diseasse  . Heart disease Maternal Uncle 76    Died of "heart exploding"   Social HX:    Social History   Social History  . Marital status: Married    Spouse name: N/A  . Number of children: 4  . Years of education: N/A   Occupational History  . Not on file.   Social History Main Topics  . Smoking status: Never Smoker  . Smokeless tobacco: Never Used  . Alcohol use No  . Drug use: No  . Sexual activity: Not on file   Other Topics Concern  . 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  Oldest daughter has CP     ROS:  All 11 ROS were addressed and are negative except what is stated in the  HPI   Physical Exam: Blood pressure 129/72, pulse 61, temperature 97.8 F (36.6 C), temperature source Oral, resp. rate 16, height 5\' 6"  (1.676 m), weight 193 lb (87.5 kg), SpO2 100 %.   General: Well developed, well nourished, in no acute distress Head: Eyes PERRLA, No xanthomas.   Normal cephalic and atramatic  Lungs:  Clear bilaterally to auscultation and percussion. Normal respiratory effort. No wheezes, no rales. Heart:  HRRR S1 S2 Pulses are 2+ & equal. No murmurs, rubs or gallops.             No carotid bruit. No JVD.  No abdominal bruits. Abdomen: Bowel sounds are positive, abdomen soft and non-tender without masses or                 Hernia's noted. No hepatosplenomegaly. Msk:  Back normal, normal gait. Normal strength and tone for age. Extremities:  No clubbing, cyanosis or edema.  DP +1 Neuro: Alert and oriented X 3, non-focal, MAE x 4 GU: Deferred Rectal: Deferred Psych:  Good affect, responds appropriately         Labs:   Lab Results  Component Value Date   WBC 6.9 05/05/2016   HGB 14.8 05/05/2016   HCT 42.3 05/05/2016   MCV 92.2 05/05/2016   PLT 219 05/05/2016    Recent Labs Lab 05/05/16 1350  NA 138  K 3.8  CL 105  CO2 25  BUN 17  CREATININE 0.90  CALCIUM 9.4  GLUCOSE 102*   No results for input(s): CKTOTAL, CKMB, TROPONINI in the last 72 hours. Lab Results  Component Value Date   CHOL 98 06/11/2015   HDL 43 06/11/2015   LDLCALC 43 06/11/2015   TRIG 62 06/11/2015   No results found for: DDIMER   Radiology:  Dg Chest 2 View  Result Date: 05/05/2016 CLINICAL DATA:  60 year old male with a history of chest pain EXAM: CHEST  2 VIEW COMPARISON:  06/10/2015 FINDINGS: The heart size and mediastinal contours are within normal limits. Both lungs are clear. The visualized skeletal structures are unremarkable. IMPRESSION: No active cardiopulmonary disease. Signed, Dulcy Fanny. Earleen Newport, DO [facility]  Electronically Signed   By: Corrie Mckusick D.O.   On: 05/05/2016 14:28   Personally viewed.   EKG:  Today-Normal sinus rhythm with no ST segment changes Personally viewed.  Nuclear stress test 06/11/2015: IMPRESSION: 1. Large fixed defect involving the lateral and inferolateral walls without significant wall motion abnormality. Study appears to have technical limitations due to orientation of the heart between the rest and stress images.  Areas of possible reversibility along the lateral apex and anteroseptal wall are equivocal.  2. No significant wall motion abnormality despite a large fixed defect involving the lateral and inferolateral walls.  3. Left ventricular ejection fraction is 51%.  4. Low-risk stress test findings*.  ASSESSMENT/PLAN:   60 year old male here with chest pain that began at rest finally subsided several hours later with associated shortness of breath concerning for unstable angina.  Unstable angina  - Troponin thus far is normal point-of-care 2  - EKG is unremarkable without any ST segment changes  - History however with 3 separate stents, 2 in the RCA and one in the circumflex with pain at rest, left arm somewhat concerning.  - I would place on heparin overnight IV, continue aspirin and make him nothing by mouth past midnight for diagnostic angiogram. Previous stress test just less than one year ago showed a large fixed defect in the lateral and inferolateral walls however no significant ischemia was identified.  - Risk of heart catheterization including stroke, heart attack, death, renal impairment, bleeding explained. Willing to proceed.  - Continue with beta blocker as tolerated, statin, aspirin.  - He had previously tolerated Brilinta well.  - The stress of new job may have exacerbated symptoms.   - Recent walking up hill over the weekend did not result in symptoms.   Hyperlipidemia  - Prior LDL 43 with HDL of 43. Continuing statin.  - states that he  eats well.  Essential hypertension  - Continue antihypertensive regimen as above.   Candee Furbish, MD  05/05/2016  7:45 PM

## 2016-05-05 NOTE — ED Triage Notes (Signed)
Pt started having left sided pain yesterday, tightness and short of breath started today-- has extensive heart hx, also started new job today.

## 2016-05-05 NOTE — ED Provider Notes (Signed)
[facility] DEPT Provider Note   CSN: [id number] Arrival date & time: 05/05/16  1336     History   Chief Complaint Chief Complaint  Patient presents with  . Chest Pain    HPI Michael [name] is a 60 y.o. male.  The history is provided by the patient.  Chest Pain   This is a recurrent problem. The current episode started 6 to 12 hours ago. The problem occurs constantly. The problem has been resolved. The pain is associated with rest. The pain is present in the lateral region. The pain is at a severity of 5/10. The pain is moderate. The quality of the pain is described as burning. The pain radiates to the left arm. Duration of episode(s) is 5 hours. Associated symptoms include shortness of breath. Pertinent negatives include no abdominal pain, no cough, no diaphoresis, no irregular heartbeat, no nausea, no syncope, no vomiting and no weakness. He has tried rest for the symptoms. The treatment provided no relief. Risk factors include male gender.  His past medical history is significant for CAD and MI. Past medical history comments: Last stent was placed in 2015 in [place]. Stress test done approximately 1 year ago was normal    Past Medical History:  Diagnosis Date  . CAD (coronary artery disease)    a. s/p PCI of LCx (2006) and ostium of RCA (2007) as well as DES to RCA in [place] (05/2014)  . HTN (hypertension)   . Hyperlipidemia   . Obesity     Patient Active Problem List   Diagnosis Date Noted  . Chest pain with moderate risk of acute coronary syndrome 05/05/2016  . Chest pain 06/11/2015  . Obesity   . HTN (hypertension)   . CAD (coronary artery disease) 09/08/2014  . Hyperlipidemia 09/08/2014    Past Surgical History:  Procedure Laterality Date  . CORONARY ANGIOPLASTY WITH STENT PLACEMENT  2006   in [place], occluded CFX, s/p Taxus stent  . CORONARY ANGIOPLASTY WITH STENT PLACEMENT  2007   in [place], ostial RCA stent  . CORONARY ANGIOPLASTY  WITH STENT PLACEMENT  2015   in [place], RCA stent  . TONSILLECTOMY AND ADENOIDECTOMY          Home Medications    Prior to Admission medications   Medication Sig Start Date End Date Taking? Authorizing Provider  aspirin 81 MG tablet Take 81 mg by mouth daily.   Yes Historical Provider, MD  atorvastatin (LIPITOR) 80 MG tablet Take 1 tablet (80 mg total) by mouth daily. 11/22/15  Yes Marletta Lor, MD  Cinnamon 500 MG capsule Take 500 mg by mouth daily.   Yes Historical Provider, MD  lisinopril (PRINIVIL,ZESTRIL) 5 MG tablet Take 1 tablet (5 mg total) by mouth daily. 11/22/15  Yes Marletta Lor, MD  metoprolol succinate (TOPROL-XL) 25 MG 24 hr tablet Take 1 tablet (25 mg total) by mouth daily. 11/22/15  Yes Marletta Lor, MD  Multiple Vitamins-Minerals (EMERGEN-C VITAMIN C) PACK Take 1 Package by mouth daily.    Historical Provider, MD  nitroGLYCERIN (NITROSTAT) 0.4 MG SL tablet Place 1 tablet (0.4 mg total) under the tongue every 5 (five) minutes x 3 doses as needed for chest pain. 06/11/15   Eileen Stanford, PA-C  Omega 3-6-9 CAPS Take 3,200 mg by mouth daily.    Historical Provider, MD    Family History Family History  Problem Relation Age of Onset  . Hypertension Father   . Dementia Father  Vascular  . Heart disease Maternal Grandfather 56    Died probably of heart diseasse  . Heart disease Maternal Uncle 30    Died of "heart exploding"    Social History Social History  Substance Use Topics  . Smoking status: Never Smoker  . Smokeless tobacco: Never Used  . Alcohol use No     Allergies   Review of patient's allergies indicates no known allergies.   Review of Systems Review of Systems  Constitutional: Negative for diaphoresis.  Respiratory: Positive for shortness of breath. Negative for cough.   Cardiovascular: Positive for chest pain. Negative for syncope.  Gastrointestinal: Negative for abdominal pain, nausea and vomiting.  Neurological:  Negative for weakness.  All other systems reviewed and are negative.    Physical Exam Updated Vital Signs BP 129/72 (BP Location: Right Arm)   Pulse 61   Temp 97.8 F (36.6 C) (Oral)   Resp 16   Ht 5\' 6"  (1.676 m)   Wt 193 lb (87.5 kg)   SpO2 100%   BMI 31.15 kg/m   Physical Exam  Constitutional: He is oriented to person, place, and time. He appears well-developed and well-nourished. No distress.  HENT:  Head: Normocephalic and atraumatic.  Mouth/Throat: Oropharynx is clear and moist.  Eyes: Conjunctivae and EOM are normal. Pupils are equal, round, and reactive to light.  Neck: Normal range of motion. Neck supple.  Cardiovascular: Normal rate, regular rhythm and intact distal pulses.   No murmur heard. Pulmonary/Chest: Effort normal and breath sounds normal. No respiratory distress. He has no wheezes. He has no rales.  Abdominal: Soft. He exhibits no distension. There is no tenderness. There is no rebound and no guarding.  Musculoskeletal: Normal range of motion. He exhibits no edema or tenderness.  Neurological: He is alert and oriented to person, place, and time.  Skin: Skin is warm and dry. No rash noted. No erythema.  Psychiatric: He has a normal mood and affect. His behavior is normal.  Nursing note and vitals reviewed.    ED Treatments / Results  Labs (all labs ordered are listed, but only abnormal results are displayed) Labs Reviewed  BASIC METABOLIC PANEL - Abnormal; Notable for the following:       Result Value   Glucose, Bld 102 (*)    All other components within normal limits  CBC  TROPONIN I  TROPONIN I  PROTIME-INR  CBC  COMPREHENSIVE METABOLIC PANEL  CBC  CREATININE, SERUM  I-STAT TROPOININ, ED  I-STAT TROPOININ, ED    EKG  EKG Interpretation  Date/Time:  Monday May 05 2016 13:42:39 EDT Ventricular Rate:  66 PR Interval:  142 QRS Duration: 94 QT Interval:  426 QTC Calculation: 446 R Axis:   76 Text Interpretation:  Normal sinus  rhythm Normal ECG No significant change since last tracing Confirmed by Maryan Rued  MD, Loree Fee ([id number]) on 05/05/2016 6:12:58 PM       Radiology Dg Chest 2 View  Result Date: 05/05/2016 CLINICAL DATA:  60 year old male with a history of chest pain EXAM: CHEST  2 VIEW COMPARISON:  06/10/2015 FINDINGS: The heart size and mediastinal contours are within normal limits. Both lungs are clear. The visualized skeletal structures are unremarkable. IMPRESSION: No active cardiopulmonary disease. Signed, Dulcy Fanny. Earleen Newport, DO [facility] Rad[facility] Electronically Signed   By: Corrie Mckusick D.O.   On: 05/05/2016 14:28    Procedures Procedures (including critical care time)  Medications Ordered in ED Medications  aspirin EC tablet  81 mg (not administered)  nitroGLYCERIN (NITROSTAT) SL tablet 0.4 mg (not administered)  acetaminophen (TYLENOL) tablet 650 mg (not administered)  ondansetron (ZOFRAN) injection 4 mg (not administered)  sodium chloride flush (NS) 0.9 % injection 3 mL (not administered)  sodium chloride flush (NS) 0.9 % injection 3 mL (not administered)  0.9 %  sodium chloride infusion (not administered)  nitroGLYCERIN 50 mg in dextrose 5 % 250 mL (0.2 mg/mL) infusion (not administered)  ALPRAZolam (XANAX) tablet 0.25 mg (not administered)  zolpidem (AMBIEN) tablet 5 mg (not administered)  enoxaparin (LOVENOX) injection 40 mg (not administered)     Initial Impression / Assessment and Plan / ED Course  I have reviewed the triage vital signs and the nursing notes.  Pertinent labs & imaging results that were available during my care of the patient were reviewed by me and considered in my medical decision making (see chart for details).  Clinical Course   Patient is a 60 year old male with extensive cardiac history presenting today with left-sided chest pain radiating to his arm and causing shortness of breath that started around 9 AM and  resolved around 4 PM. Last stent was 2015. He was seen approximately one year ago and had a normal stress test and has not had any significant pain until now. EKG, delta troponin and labs are within normal limits. Patient is at this point chest pain-free however with his prior history concerned that this could still be cardiac in nature. Symptoms are not suggestive of PE, GI or musculoskeletal. Discussed with cardiology who will admit the patient.  Final Clinical Impressions(s) / ED Diagnoses   Final diagnoses:  Unstable angina [facility])    New Prescriptions New Prescriptions   No medications on file     Blanchie Dessert, MD 05/05/16 2030

## 2016-05-06 ENCOUNTER — Encounter (HOSPITAL_COMMUNITY)
Admission: EM | Disposition: A | Discharge status: Home / Self Care | Payer: Self-pay | Source: Home / Self Care | Attending: Cardiology

## 2016-05-06 ENCOUNTER — Encounter (HOSPITAL_COMMUNITY): Payer: Self-pay | Admitting: Cardiology

## 2016-05-06 DIAGNOSIS — I25110 Atherosclerotic heart disease of native coronary artery with unstable angina pectoris: Principal | ICD-10-CM

## 2016-05-06 DIAGNOSIS — I200 Unstable angina: Secondary | ICD-10-CM | POA: Diagnosis present

## 2016-05-06 DIAGNOSIS — E785 Hyperlipidemia, unspecified: Secondary | ICD-10-CM | POA: Diagnosis not present

## 2016-05-06 DIAGNOSIS — T463X5A Adverse effect of coronary vasodilators, initial encounter: Secondary | ICD-10-CM | POA: Diagnosis not present

## 2016-05-06 DIAGNOSIS — R079 Chest pain, unspecified: Secondary | ICD-10-CM | POA: Diagnosis not present

## 2016-05-06 DIAGNOSIS — R739 Hyperglycemia, unspecified: Secondary | ICD-10-CM | POA: Diagnosis not present

## 2016-05-06 DIAGNOSIS — I10 Essential (primary) hypertension: Secondary | ICD-10-CM | POA: Diagnosis not present

## 2016-05-06 DIAGNOSIS — Y92239 Unspecified place in hospital as the place of occurrence of the external cause: Secondary | ICD-10-CM | POA: Diagnosis not present

## 2016-05-06 DIAGNOSIS — Z6830 Body mass index (BMI) 30.0-30.9, adult: Secondary | ICD-10-CM | POA: Diagnosis not present

## 2016-05-06 DIAGNOSIS — Z8249 Family history of ischemic heart disease and other diseases of the circulatory system: Secondary | ICD-10-CM | POA: Diagnosis not present

## 2016-05-06 DIAGNOSIS — I952 Hypotension due to drugs: Secondary | ICD-10-CM | POA: Diagnosis not present

## 2016-05-06 DIAGNOSIS — E669 Obesity, unspecified: Secondary | ICD-10-CM | POA: Diagnosis not present

## 2016-05-06 DIAGNOSIS — I252 Old myocardial infarction: Secondary | ICD-10-CM | POA: Diagnosis not present

## 2016-05-06 DIAGNOSIS — Z955 Presence of coronary angioplasty implant and graft: Secondary | ICD-10-CM | POA: Diagnosis not present

## 2016-05-06 DIAGNOSIS — I25700 Atherosclerosis of coronary artery bypass graft(s), unspecified, with unstable angina pectoris: Secondary | ICD-10-CM | POA: Diagnosis not present

## 2016-05-06 HISTORY — PX: CARDIAC CATHETERIZATION: SHX172

## 2016-05-06 LAB — CBC
HCT: 39.9 % (ref 39.0–52.0)
Hemoglobin: 13.6 g/dL (ref 13.0–17.0)
MCH: 31.1 pg (ref 26.0–34.0)
MCHC: 34.1 g/dL (ref 30.0–36.0)
MCV: 91.3 fL (ref 78.0–100.0)
Platelets: 216 10*3/uL (ref 150–400)
RBC: 4.37 MIL/uL (ref 4.22–5.81)
RDW: 12.3 % (ref 11.5–15.5)
WBC: 6 10*3/uL (ref 4.0–10.5)

## 2016-05-06 LAB — TROPONIN I: Troponin I: <0.03 ng/mL (ref <0.03)

## 2016-05-06 LAB — COMPREHENSIVE METABOLIC PANEL
ALT: 25 U/L (ref 17–63)
AST: 25 U/L (ref 15–41)
Albumin: 3.4 g/dL — ABNORMAL LOW (ref 3.5–5.0)
Alkaline Phosphatase: 64 U/L (ref 38–126)
Anion gap: 7 (ref 5–15)
BUN: 14 mg/dL (ref 6–20)
CO2: 27 mmol/L (ref 22–32)
Calcium: 9 mg/dL (ref 8.9–10.3)
Chloride: 105 mmol/L (ref 101–111)
Creatinine, Ser: 1.07 mg/dL (ref 0.61–1.24)
GFR calc Af Amer: >60 mL/min (ref >60)
GFR calc non Af Amer: >60 mL/min (ref >60)
Glucose, Bld: 111 mg/dL — ABNORMAL HIGH (ref 65–99)
Potassium: 3.8 mmol/L (ref 3.5–5.1)
Sodium: 139 mmol/L (ref 135–145)
Total Bilirubin: 2 mg/dL — ABNORMAL HIGH (ref 0.3–1.2)
Total Protein: 5.6 g/dL — ABNORMAL LOW (ref 6.5–8.1)

## 2016-05-06 LAB — PROTIME-INR
INR: 1.08
Prothrombin Time: 14.1 seconds (ref 11.4–15.2)

## 2016-05-06 LAB — POCT ACTIVATED CLOTTING TIME
Activated Clotting Time: 230 seconds
Activated Clotting Time: 279 seconds

## 2016-05-06 SURGERY — LEFT HEART CATH AND CORONARY ANGIOGRAPHY

## 2016-05-06 MED ORDER — SODIUM CHLORIDE 0.9% FLUSH: 3.0000 mL | INTRAVENOUS | Status: DC | PRN

## 2016-05-06 MED ORDER — NITROGLYCERIN 1 MG/10 ML FOR IR/CATH LAB
INTRA_ARTERIAL | Status: DC | PRN
  Administered 2016-05-06 (×2): 100 ug via INTRACORONARY

## 2016-05-06 MED ORDER — HEPARIN SODIUM (PORCINE) 1000 UNIT/ML IJ SOLN
INTRAMUSCULAR | Status: DC | PRN
  Administered 2016-05-06: 3000 [IU] via INTRAVENOUS
  Administered 2016-05-06: 4500 [IU] via INTRAVENOUS
  Administered 2016-05-06: 2000 [IU] via INTRAVENOUS

## 2016-05-06 MED ORDER — NITROGLYCERIN 0.4 MG/SPRAY TL SOLN
Status: DC | PRN
Start: 2016-05-06 — End: 2016-05-06
  Administered 2016-05-06: 400 mg via SUBLINGUAL

## 2016-05-06 MED ORDER — ADENOSINE 12 MG/4ML IV SOLN
INTRAVENOUS | Status: AC
  Filled 2016-05-06: qty 4

## 2016-05-06 MED ORDER — ATORVASTATIN CALCIUM 80 MG PO TABS: 80.0000 mg | ORAL_TABLET | Freq: Every day | ORAL | Status: DC

## 2016-05-06 MED ORDER — HEPARIN (PORCINE) IN NACL 2-0.9 UNIT/ML-% IJ SOLN
INTRAMUSCULAR | Status: AC
Start: 2016-05-06 — End: 2016-05-06
  Filled 2016-05-06: qty 1000

## 2016-05-06 MED ORDER — NITROGLYCERIN IN D5W 200-5 MCG/ML-% IV SOLN: 5.0000 ug/min | INTRAVENOUS | Status: DC

## 2016-05-06 MED ORDER — IOPAMIDOL (ISOVUE-370) INJECTION 76%
INTRAVENOUS | Status: AC
  Filled 2016-05-06: qty 50

## 2016-05-06 MED ORDER — VERAPAMIL HCL 2.5 MG/ML IV SOLN
INTRAVENOUS | Status: AC
  Filled 2016-05-06: qty 2

## 2016-05-06 MED ORDER — NITROGLYCERIN 1 MG/10 ML FOR IR/CATH LAB
INTRA_ARTERIAL | Status: AC
  Filled 2016-05-06: qty 10

## 2016-05-06 MED ORDER — VERAPAMIL HCL 2.5 MG/ML IV SOLN
INTRAVENOUS | Status: DC | PRN
  Administered 2016-05-06: 10 mL via INTRA_ARTERIAL

## 2016-05-06 MED ORDER — FENTANYL CITRATE (PF) 100 MCG/2ML IJ SOLN
INTRAMUSCULAR | Status: AC
  Filled 2016-05-06: qty 2

## 2016-05-06 MED ORDER — TICAGRELOR 90 MG PO TABS
ORAL_TABLET | ORAL | Status: DC | PRN
  Administered 2016-05-06: 180 mg via ORAL

## 2016-05-06 MED ORDER — LIDOCAINE HCL (PF) 1 % IJ SOLN
INTRAMUSCULAR | Status: AC
  Filled 2016-05-06: qty 30

## 2016-05-06 MED ORDER — SODIUM CHLORIDE 0.9 % WEIGHT BASED INFUSION: 3.0000 mL/kg/h | INTRAVENOUS | Status: AC

## 2016-05-06 MED ORDER — IOPAMIDOL (ISOVUE-370) INJECTION 76%
INTRAVENOUS | Status: DC | PRN
  Administered 2016-05-06: 180 mL via INTRA_ARTERIAL

## 2016-05-06 MED ORDER — SODIUM CHLORIDE 0.9% FLUSH: 3.0000 mL | Freq: Two times a day (BID) | INTRAVENOUS | Status: DC

## 2016-05-06 MED ORDER — MIDAZOLAM HCL 2 MG/2ML IJ SOLN
INTRAMUSCULAR | Status: AC
  Filled 2016-05-06: qty 2

## 2016-05-06 MED ORDER — TICAGRELOR 90 MG PO TABS
ORAL_TABLET | ORAL | Status: AC
  Filled 2016-05-06: qty 2

## 2016-05-06 MED ORDER — MIDAZOLAM HCL 2 MG/2ML IJ SOLN
INTRAMUSCULAR | Status: DC | PRN
  Administered 2016-05-06: 1 mg via INTRAVENOUS

## 2016-05-06 MED ORDER — TICAGRELOR 90 MG PO TABS
90.0000 mg | ORAL_TABLET | Freq: Two times a day (BID) | ORAL | Status: DC
  Administered 2016-05-07 (×2): 90 mg via ORAL
  Filled 2016-05-06 (×2): qty 1

## 2016-05-06 MED ORDER — HEPARIN SODIUM (PORCINE) 1000 UNIT/ML IJ SOLN
INTRAMUSCULAR | Status: AC
  Filled 2016-05-06: qty 1

## 2016-05-06 MED ORDER — HEPARIN (PORCINE) IN NACL 2-0.9 UNIT/ML-% IJ SOLN
INTRAMUSCULAR | Status: DC | PRN
  Administered 2016-05-06: 1000 mL

## 2016-05-06 MED ORDER — LIDOCAINE HCL (PF) 1 % IJ SOLN
INTRAMUSCULAR | Status: DC | PRN
  Administered 2016-05-06: 1 mL via INTRADERMAL

## 2016-05-06 MED ORDER — IOPAMIDOL (ISOVUE-370) INJECTION 76%
INTRAVENOUS | Status: AC
  Filled 2016-05-06: qty 100

## 2016-05-06 MED ORDER — FENTANYL CITRATE (PF) 100 MCG/2ML IJ SOLN
INTRAMUSCULAR | Status: DC | PRN
  Administered 2016-05-06: 50 ug via INTRAVENOUS

## 2016-05-06 MED ORDER — SODIUM CHLORIDE 0.9 % IV SOLN: 250.0000 mL | INTRAVENOUS | Status: DC | PRN

## 2016-05-06 SURGICAL SUPPLY — 16 items
BALLN EMERGE MR 2.25X12 (BALLOONS) ×2
BALLOON EMERGE MR 2.25X12 (BALLOONS) ×1 IMPLANT
CATH INFINITI 5 FR JL3.5 (CATHETERS) ×2 IMPLANT
CATH INFINITI JR4 5F (CATHETERS) ×2 IMPLANT
CATH VISTA GUIDE 6FR XBLAD3.5 (CATHETERS) ×2 IMPLANT
DEVICE RAD COMP TR BAND LRG (VASCULAR PRODUCTS) ×2 IMPLANT
GLIDESHEATH SLEND SS 6F .021 (SHEATH) ×2 IMPLANT
KIT ENCORE 26 ADVANTAGE (KITS) ×2 IMPLANT
KIT HEART LEFT (KITS) ×2 IMPLANT
PACK CARDIAC CATHETERIZATION (CUSTOM PROCEDURE TRAY) ×2 IMPLANT
STENT PROMUS PREM MR 2.5X12 (Permanent Stent) ×2 IMPLANT
STENT PROMUS PREM MR 2.5X24 (Permanent Stent) ×2 IMPLANT
TRANSDUCER W/STOPCOCK (MISCELLANEOUS) ×2 IMPLANT
TUBING CIL FLEX 10 FLL-RA (TUBING) ×2 IMPLANT
WIRE ASAHI PROWATER 180CM (WIRE) ×2 IMPLANT
WIRE SAFE-T 1.5MM-J .035X260CM (WIRE) ×2 IMPLANT

## 2016-05-06 NOTE — Care Management Note (Signed)
Case Management Note  Patient Details  Name: Michael [name] MRN: [id number] Date of Birth: May 25, 1956  Subjective/Objective:    Patient s/p coronary intervention, will be on brilinta, NCM will cont to follow for dc needs.  Per benefit check   S/W NATASHA @ [employer] [facility] # [id number]   BRILINTA 90 MG BID ( 30 )   COVER- YES  CO-PAY- $24.00  PRIOR APPROVAL - NO  PHARMACY: [facility], [facility]                Action/Plan:   Expected Discharge Date:                  Expected Discharge Plan:  Home/Self Care  In-House Referral:     Discharge planning Services  CM Consult  Post Acute Care Choice:    Choice offered to:     DME Arranged:    DME Agency:     HH Arranged:    HH Agency:     Status of Service:  In process, will continue to follow  If discussed at Long Length of Stay Meetings, dates discussed:    Additional Comments:  Zenon Mayo, RN 05/06/2016, 5:30 PM

## 2016-05-06 NOTE — Progress Notes (Signed)
Paged on call MD regarding pt's drop in BP while on nitro gtt. Latest BP was 90/53. BP prior to start of gtt was 128/63. MD ordered to stop the drip at this time as the patient is not in any active chest pain. Infusion has been stopped. Will continue to monitor.

## 2016-05-06 NOTE — Progress Notes (Signed)
Right TR Band removed and clean dry drsg applied. Palpable Right radial and ulnar. CSM WNL. Site Level 0 on arrival to floor and level 0 now after band removed. Pt given Right radial mobility restrictions again and pt verbalized understanding. Pt tol well.

## 2016-05-06 NOTE — Interval H&P Note (Signed)
History and Physical Interval Note:  05/06/2016 11:45 AM  [name]  has presented today for cardiac catherization, with the diagnosis of unstable angina. The various methods of treatment have been discussed with the patient and family. After consideration of risks, benefits and other options for treatment, the patient has consented to  Procedure(s): Left Heart Cath and Coronary Angiography (N/A) as a surgical intervention .  The patient's history has been reviewed, patient examined, no change in status, stable for surgery.  I have reviewed the patient's chart and labs.  Questions were answered to the patient's satisfaction.    Cath Lab Visit (complete for each Cath Lab visit)  Clinical Evaluation Leading to the Procedure:   ACS: Yes.    Non-ACS:    Anginal Classification: CCS IV  Anti-ischemic medical therapy: Minimal Therapy (1 class of medications)  Non-Invasive Test Results: Low-risk stress test findings: cardiac mortality <1%/year  Prior CABG: No previous CABG   Gracin Mcpartland

## 2016-05-06 NOTE — Progress Notes (Signed)
Patient: Michael [name] / Admit Date: 05/05/2016 / Date of Encounter: 05/06/2016, 9:24 AM   Subjective: Didn't sleep well overnight due to the IV machine noise. Otherwise no CP this AM. No recent long car rides, surgery, bedrest, LEE. No SOB, just feels constricted if he tries to take a deep breath.   Objective: Telemetry: NSR Physical Exam: Blood pressure (!) 115/58, pulse 67, temperature 98 F (36.7 C), temperature source Oral, resp. rate 15, height 5\' 6"  (1.676 m), weight 194 lb 9.6 oz (88.3 kg), SpO2 99 %. General: Well developed, well nourished WM in no acute distress. Head: Normocephalic, atraumatic, sclera non-icteric, no xanthomas, nares are without discharge. Neck: Negative for carotid bruits. JVP not elevated. Lungs: Clear bilaterally to auscultation without wheezes, rales, or rhonchi. Breathing is unlabored. Heart: RRR S1 S2 without murmurs, rubs, or gallops.  Abdomen: Soft, non-tender, non-distended with normoactive bowel sounds. No rebound/guarding. Extremities: No clubbing or cyanosis. No edema. Distal pedal pulses are 2+ and equal bilaterally. Neuro: Alert and oriented X 3. Moves all extremities spontaneously. Psych:  Responds to questions appropriately with a normal affect.   Intake/Output Summary (Last 24 hours) at 05/06/16 0924 Last data filed at 05/06/16 0300  Gross per 24 hour  Intake             5.28 ml  Output                0 ml  Net             5.28 ml    Inpatient Medications:  . aspirin EC  81 mg Oral Daily  . enoxaparin (LOVENOX) injection  40 mg Subcutaneous QHS  . sodium chloride flush  3 mL Intravenous Q12H  . sodium chloride flush  3 mL Intravenous Q12H   Infusions:  . sodium chloride 1 mL/kg/hr (05/06/16 0700)  . nitroGLYCERIN Stopped (05/06/16 0248)    Labs:  Recent Labs  05/05/16 1350 05/05/16 2146 05/06/16 0448  NA 138  --  139  K 3.8  --  3.8  CL 105  --  105  CO2 25  --  27  GLUCOSE 102*  --  111*  BUN 17  --  14    CREATININE 0.90 0.98 1.07  CALCIUM 9.4  --  9.0    Recent Labs  05/06/16 0448  AST 25  ALT 25  ALKPHOS 64  BILITOT 2.0*  PROT 5.6*  ALBUMIN 3.4*    Recent Labs  05/05/16 2146 05/06/16 0448  WBC 7.9 6.0  HGB 14.3 13.6  HCT 41.2 39.9  MCV 91.2 91.3  PLT 189 216    Recent Labs  05/05/16 2146 05/06/16 0116  TROPONINI <0.03 <0.03   Invalid input(s): POCBNP No results for input(s): HGBA1C in the last 72 hours.   Radiology/Studies:  Dg Chest 2 View  Result Date: 05/05/2016 CLINICAL DATA:  60 year old male with a history of chest pain EXAM: CHEST  2 VIEW COMPARISON:  06/10/2015 FINDINGS: The heart size and mediastinal contours are within normal limits. Both lungs are clear. The visualized skeletal structures are unremarkable. IMPRESSION: No active cardiopulmonary disease. Signed, Dulcy Fanny. Earleen Newport, DO [facility] Rad[facility] Electronically Signed   By: Corrie Mckusick D.O.   On: 05/05/2016 14:28     Assessment and Plan  66M with history of CAD (PCI of LCx 2006 and ostium of RCA 2007 as well as DES to RCA in [place] 05/2014), HTN, HLD, obesity was admitted with chest pain,  left arm discomfort, shortness of breath. Possible MSK component as well as increased drop stress.  1. Chest pain possibly due to unstable angina with history of CAD as above - mixed features, mostly atypical but sx do remind patient of prior angina. Plan cath today as previously discussed. Risks/benefits already reviewed with patient. I answered all questions to the best of my ability.  2. HTN with hypotension - BP dropped on IV NTG into the [id number] systolic, so it was stopped. Lisinopril and BB on hold. Would resume Toprol after cath if BP remains stable during procedure.  3. Hyperlipidemia - resume home Lipitor.  Signed, Melina Copa PA-C Pager: [phone]

## 2016-05-07 ENCOUNTER — Telehealth: Payer: Self-pay | Admitting: Cardiology

## 2016-05-07 DIAGNOSIS — I25700 Atherosclerosis of coronary artery bypass graft(s), unspecified, with unstable angina pectoris: Secondary | ICD-10-CM

## 2016-05-07 DIAGNOSIS — I959 Hypotension, unspecified: Secondary | ICD-10-CM

## 2016-05-07 LAB — BASIC METABOLIC PANEL
Anion gap: 5 (ref 5–15)
BUN: 14 mg/dL (ref 6–20)
CO2: 25 mmol/L (ref 22–32)
Calcium: 9.1 mg/dL (ref 8.9–10.3)
Chloride: 107 mmol/L (ref 101–111)
Creatinine, Ser: 0.96 mg/dL (ref 0.61–1.24)
GFR calc Af Amer: >60 mL/min (ref >60)
GFR calc non Af Amer: >60 mL/min (ref >60)
Glucose, Bld: 126 mg/dL — ABNORMAL HIGH (ref 65–99)
Potassium: 3.8 mmol/L (ref 3.5–5.1)
Sodium: 137 mmol/L (ref 135–145)

## 2016-05-07 LAB — CBC
HCT: 39.5 % (ref 39.0–52.0)
Hemoglobin: 13.7 g/dL (ref 13.0–17.0)
MCH: 31.7 pg (ref 26.0–34.0)
MCHC: 34.7 g/dL (ref 30.0–36.0)
MCV: 91.4 fL (ref 78.0–100.0)
Platelets: 206 10*3/uL (ref 150–400)
RBC: 4.32 MIL/uL (ref 4.22–5.81)
RDW: 12.3 % (ref 11.5–15.5)
WBC: 8.6 10*3/uL (ref 4.0–10.5)

## 2016-05-07 MED ORDER — METOPROLOL SUCCINATE ER 25 MG PO TB24: 12.5000 mg | ORAL_TABLET | Freq: Every day | ORAL | 1 refills | Status: DC

## 2016-05-07 MED ORDER — TICAGRELOR 90 MG PO TABS: 90.0000 mg | ORAL_TABLET | Freq: Two times a day (BID) | ORAL | 3 refills | Status: DC

## 2016-05-07 MED ORDER — ANGIOPLASTY BOOK
Freq: Once | Status: DC
  Filled 2016-05-07: qty 1

## 2016-05-07 MED ORDER — NITROGLYCERIN 0.4 MG SL SUBL: 0.4000 mg | SUBLINGUAL_TABLET | SUBLINGUAL | 3 refills | Status: DC | PRN

## 2016-05-07 NOTE — Discharge Summary (Signed)
Discharge Summary    Patient ID: Michael [name],  MRN: [id number], DOB/AGE: 1956/06/07 60 y.o.  Admit date: 05/05/2016 Discharge date: 05/07/2016  Primary Care Provider: Nyoka Cowden Primary Cardiologist: Hochrein  Discharge Diagnoses    Principal Problem:   Unstable angina [facility]) Active Problems:   Chest pain with moderate risk of acute coronary syndrome   CAD in native artery   Hyperlipidemia   HTN (hypertension)   Obesity   Hypotension    Diagnostic Studies/Procedures    1. Cardiac catheterization this admission, please see full report and below for summary. _____________   History of Present Illness & Hospital Course    Michael [name] is a 60 y/o M with history of CAD (PCI of LCx 2006 and ostium of RCA 2007 as well as DES to RCA in [place] 05/2014), HTN, HLD, obesity was admitted with chest pain, left arm discomfort, and shortness of breath.  His troponins were negative x5. There was a possible MSK component with tenderness to palpation as well as possible contribution of job stress. However, given that symptoms were similar to prior angina, LHC was recommended for definitive eval. LHC 05/06/16 showed 80% mid-distal LAD s/p overlapping DESx2, 70% D2, patent stents in the mid LCx, ostial RCA, and mid/distal RCA, otherwise mild nonobstructive disease. LVEDP was normal. It was recommended to continue DAPT for at least 12 months. Brilinta was started and he received the 30 day free card + standard rx. He had mild post-PCI chest discomfort without EKG changes felt likely due to the stent dilation. He was on low dose NTG which was titrated off within an hour of his PCI. He did well otherwise. This AM he feels well. Dr. Radford Pax has seen and examined the patient today and feels he is stable for discharge. He was referred to [place]. He was released to return to work in 1 week. He works part time one week on/one week off so officially plans to return the first week of  September.  Issues for f/u: - He had softer BP this admission following NTG. BB/lisinopril were placed on hold. His BB was resumed today at 1/2 dose. Will hold on Lisinopril for now. - He was asked to f/u PCP for mildly elevated blood sugar. - Dr. Radford Pax recommended outpatient FLP with recommendation to adjust cholesterol regimen if needed. I have asked him to come to his f/u appointment fasting (8am) - I did not order the lab itself in case the PA that sees him requests any additional labwork that day.    _____________  Discharge Vitals Blood pressure 118/66, pulse 67, temperature 97.4 F (36.3 C), temperature source Oral, resp. rate 14, height 5\' 6"  (1.676 m), weight 189 lb 9.5 oz (86 kg), SpO2 98 %.   Filed Weights   05/05/16 2315 05/06/16 0425 05/07/16 0422  Weight: 194 lb 9.6 oz (88.3 kg) 194 lb 9.6 oz (88.3 kg) 189 lb 9.5 oz (86 kg)    Labs & Radiologic Studies    CBC  Recent Labs  05/06/16 0448 05/07/16 0412  WBC 6.0 8.6  HGB 13.6 13.7  HCT 39.9 39.5  MCV 91.3 91.4  PLT 216 [id number]   Basic Metabolic Panel  Recent Labs  05/06/16 0448 05/07/16 0412  NA 139 137  K 3.8 3.8  CL 105 107  CO2 27 25  GLUCOSE 111* 126*  BUN 14 14  CREATININE 1.07 0.96  CALCIUM 9.0 9.1   Liver Function Tests  Recent Labs  05/06/16 0448  AST 25  ALT 25  ALKPHOS 64  BILITOT 2.0*  PROT 5.6*  ALBUMIN 3.4*   Cardiac Enzymes  Recent Labs  05/05/16 2146 05/06/16 0116  TROPONINI <0.03 <0.03   _____________  Dg Chest 2 View  Result Date: 05/05/2016 CLINICAL DATA:  60 year old male with a history of chest pain EXAM: CHEST  2 VIEW COMPARISON:  06/10/2015 FINDINGS: The heart size and mediastinal contours are within normal limits. Both lungs are clear. The visualized skeletal structures are unremarkable. IMPRESSION: No active cardiopulmonary disease. Signed, Dulcy Fanny. Earleen Newport, DO [facility] Radiology Electronically Signed   By: Corrie Mckusick D.O.   On: 05/05/2016 14:28   Disposition   Pt is being discharged home today in good condition.  Follow-up Plans & Appointments    Follow-up Information    Nyoka Cowden, MD .   Specialty:  Internal Medicine Why:  Your blood sugar was mildly elevated in the hospital. Follow up with PCP for further monitoring. Contact information: [place] [address] [place], PA-C .   Specialties:  Cardiology, Radiology Why:  Lurena Joiner is one of the PAs that works with Dr. Percival Spanish - see appointment below. Please come fasting to this appointment as we will likely recheck your cholesterol.  Contact information: [address] [place] [address] [phone]          Discharge Instructions    Amb Referral to Cardiac Rehabilitation    Complete by:  As directed   Diagnosis:   PTCA Coronary Stents     Diet - low sodium heart healthy    Complete by:  As directed   Increase activity slowly    Complete by:  As directed   No driving for 2 days. No lifting over 5 lbs for 1 week. No sexual activity for 1 week. You may return to work in 1 week. Keep procedure site clean & dry. If you notice increased pain, swelling, bleeding or pus, call/return!  You may shower, but no soaking baths/hot tubs/pools for 1 week.   You were started on Brilinta to help keep your stent open. Your nitroglycerin has been refilled. Your metoprolol dose was cut in half. Do not take any more lisinopril for now since your blood pressure was on the low side.      Discharge Medications   The patient uses mail order. Sent in rx's for NTG, Brilinta, Toprol there. He has separate 30 day free Brilinta rx. He already has Toprol at home. I handwrote 1 month rx for SL NTG for local pharmacy as well.    Medication List    STOP taking these medications   lisinopril 5 MG tablet Commonly known as:  PRINIVIL,ZESTRIL     TAKE these medications   aspirin  81 MG tablet Take 81 mg by mouth daily.   atorvastatin 80 MG tablet Commonly known as:  LIPITOR Take 1 tablet (80 mg total) by mouth daily.   Cinnamon 500 MG capsule Take 500 mg by mouth daily.   EMERGEN-C VITAMIN C Pack Take 1 Package by mouth daily.   metoprolol succinate 25 MG 24 hr tablet Commonly known as:  TOPROL-XL Take 0.5 tablets (12.5 mg total) by mouth daily. What changed:  how much to take   nitroGLYCERIN 0.4 MG SL tablet Commonly known as:  NITROSTAT Place 1 tablet (0.4 mg total) under the tongue every 5 (five) minutes as needed for  chest pain (up to 3 doses). What changed:  when to take this  reasons to take this   Omega 3-6-9 Caps Take 3,200 mg by mouth daily.   ticagrelor 90 MG Tabs tablet Commonly known as:  BRILINTA Take 1 tablet (90 mg total) by mouth 2 (two) times daily.       Allergies:  No Known Allergies  Outstanding Labs/Studies   See above  Duration of Discharge Encounter   Greater than 30 minutes including physician time.  Signed, Jourden Delmont N Kalila Adkison PA-C 05/07/2016, 10:00 AM

## 2016-05-07 NOTE — Telephone Encounter (Signed)
Spoke with pt, aware okay to take tylenol. 

## 2016-05-07 NOTE — Progress Notes (Signed)
SUBJECTIVE:  No complaints  OBJECTIVE:   Vitals:   Vitals:   05/06/16 2359 05/07/16 0422 05/07/16 0425 05/07/16 0748  BP:  (!) 112/57  118/66  Pulse: (!) 57 66 66 67  Resp: 14 15 14    Temp: 98.4 F (36.9 C) 98.1 F (36.7 C)  97.4 F (36.3 C)  TempSrc: Oral Oral  Oral  SpO2: 94% 94% 94% 98%  Weight:  189 lb 9.5 oz (86 kg)    Height:       I&O's:   Intake/Output Summary (Last 24 hours) at 05/07/16 0847 Last data filed at 05/07/16 0748  Gross per 24 hour  Intake           1589.6 ml  Output             2375 ml  Net           -785.4 ml   TELEMETRY: Reviewed telemetry pt in NSR:     PHYSICAL EXAM General: Well developed, well nourished, in no acute distress Head: Eyes PERRLA, No xanthomas.   Normal cephalic and atramatic  Lungs:   Clear bilaterally to auscultation and percussion. Heart:   HRRR S1 S2 Pulses are 2+ & equal Abdomen: Bowel sounds are positive, abdomen soft and non-tender without masses Msk:  Back normal, normal gait. Normal strength and tone for age. Extremities:   No clubbing, cyanosis or edema.  DP +1 Neuro: Alert and oriented X 3. Psych:  Good affect, responds appropriately   LABS: Basic Metabolic Panel:  Recent Labs  05/06/16 0448 05/07/16 0412  NA 139 137  K 3.8 3.8  CL 105 107  CO2 27 25  GLUCOSE 111* 126*  BUN 14 14  CREATININE 1.07 0.96  CALCIUM 9.0 9.1   Liver Function Tests:  Recent Labs  05/06/16 0448  AST 25  ALT 25  ALKPHOS 64  BILITOT 2.0*  PROT 5.6*  ALBUMIN 3.4*   No results for input(s): LIPASE, AMYLASE in the last 72 hours. CBC:  Recent Labs  05/06/16 0448 05/07/16 0412  WBC 6.0 8.6  HGB 13.6 13.7  HCT 39.9 39.5  MCV 91.3 91.4  PLT 216 206   Cardiac Enzymes:  Recent Labs  05/05/16 2146 05/06/16 0116  TROPONINI <0.03 <0.03   BNP: Invalid input(s): POCBNP D-Dimer: No results for input(s): DDIMER in the last 72 hours. Hemoglobin A1C: No results for input(s): HGBA1C in the last 72 hours. Fasting  Lipid Panel: No results for input(s): CHOL, HDL, LDLCALC, TRIG, CHOLHDL, LDLDIRECT in the last 72 hours. Thyroid Function Tests: No results for input(s): TSH, T4TOTAL, T3FREE, THYROIDAB in the last 72 hours.  Invalid input(s): FREET3 Anemia Panel: No results for input(s): VITAMINB12, FOLATE, FERRITIN, TIBC, IRON, RETICCTPCT in the last 72 hours. Coag Panel:   Lab Results  Component Value Date   INR 1.08 05/06/2016    RADIOLOGY: Dg Chest 2 View  Result Date: 05/05/2016 CLINICAL DATA:  60 year old male with a history of chest pain EXAM: CHEST  2 VIEW COMPARISON:  06/10/2015 FINDINGS: The heart size and mediastinal contours are within normal limits. Both lungs are clear. The visualized skeletal structures are unremarkable. IMPRESSION: No active cardiopulmonary disease. Signed, Dulcy Fanny. Earleen Newport, DO [facility] Radiology Electronically Signed   By: Corrie Mckusick D.O.   On: 05/05/2016 14:28    Assessment and Plan  60M with history of CAD (PCI of LCx 2006 and ostium of RCA 2007 as well as DES to RCA in  [place] 05/2014), HTN, HLD, obesity was admitted with chest pain, left arm discomfort, shortness of breath. Possible MSK component as well as increased stress.  1. Unstable angina -  Cath showed 80% mid to distal LAD, 70% D2 and patent stents in the mid LCx, ostial RCA and mid to distal RCA . He underwent PCI of the mid to distal RCA.  He will continue on DAPT/statin.  2. HTN - BP controlled and no further hypotension although BP soft this am.   Restart Toprol at 12.5mg  daily.  Hold off on restarting ACE I until seen as oupt.  3. Hyperlipidemia - resume home Lipitor.  Will get outpt FLP and adjust statin as needed.   Patient is ambulating in hall without any problems and is hemodynamically stable.  He is stable for discharge home with TOC followup.  Fransico Him, MD  05/07/2016  8:47 AM

## 2016-05-07 NOTE — Progress Notes (Signed)
Pt has been given all discharge instructions and has verbalized understanding of all.  Pt is aware of meds that have been called to his pharmacy and has been given a brilinta prescription to take to [place] [facility]. Right radial site is level 0 at discharge time and pt has no chest pain. All belongings with pt.

## 2016-05-07 NOTE — Telephone Encounter (Signed)
New Message  Pt call requesting to speak with RN    Pt c/o medication issue:  1. Name of Medication: Tylenol   2. How are you currently taking this medication (dosage and times per day)? NA  3. Are you having a reaction (difficulty breathing--STAT)? none  4. What is your medication issue? Pt states he was recently release from the hospital and would like to take tylenol for soreness. Pt was to know if this med would be okay to take. Please call back to discuss

## 2016-05-07 NOTE — Progress Notes (Signed)
CARDIAC REHAB PHASE I   PRE:  Rate/Rhythm: 71 SR    BP: sitting 118/66    SaO2:   MODE:  Ambulation: 1000 ft   POST:  Rate/Rhythm: 87 SR    BP: sitting 118/77     SaO2:   Tolerated well. No c/o. Ed completed/reviewed with good reception. Will send referral to [place]. He understands the importance of Brilinta. I don't believe he has the card yet. [place], ACSM 05/07/2016 8:49 AM

## 2016-05-10 ENCOUNTER — Emergency Department (HOSPITAL_COMMUNITY)

## 2016-05-10 ENCOUNTER — Encounter (HOSPITAL_COMMUNITY): Payer: Self-pay | Admitting: Emergency Medicine

## 2016-05-10 ENCOUNTER — Inpatient Hospital Stay (HOSPITAL_COMMUNITY)
Admission: EM | Admit: 2016-05-10 | Discharge: 2016-05-13 | DRG: 287 | Disposition: A | Discharge status: Home / Self Care | Attending: Cardiology | Admitting: Cardiology

## 2016-05-10 DIAGNOSIS — I25110 Atherosclerotic heart disease of native coronary artery with unstable angina pectoris: Principal | ICD-10-CM | POA: Diagnosis present

## 2016-05-10 DIAGNOSIS — R072 Precordial pain: Secondary | ICD-10-CM | POA: Diagnosis not present

## 2016-05-10 DIAGNOSIS — I10 Essential (primary) hypertension: Secondary | ICD-10-CM | POA: Diagnosis present

## 2016-05-10 DIAGNOSIS — I252 Old myocardial infarction: Secondary | ICD-10-CM | POA: Diagnosis not present

## 2016-05-10 DIAGNOSIS — R001 Bradycardia, unspecified: Secondary | ICD-10-CM | POA: Diagnosis present

## 2016-05-10 DIAGNOSIS — I25111 Atherosclerotic heart disease of native coronary artery with angina pectoris with documented spasm: Secondary | ICD-10-CM | POA: Diagnosis present

## 2016-05-10 DIAGNOSIS — Z955 Presence of coronary angioplasty implant and graft: Secondary | ICD-10-CM

## 2016-05-10 DIAGNOSIS — E669 Obesity, unspecified: Secondary | ICD-10-CM | POA: Diagnosis present

## 2016-05-10 DIAGNOSIS — Z7982 Long term (current) use of aspirin: Secondary | ICD-10-CM | POA: Diagnosis not present

## 2016-05-10 DIAGNOSIS — Z79899 Other long term (current) drug therapy: Secondary | ICD-10-CM | POA: Diagnosis not present

## 2016-05-10 DIAGNOSIS — R079 Chest pain, unspecified: Secondary | ICD-10-CM | POA: Diagnosis present

## 2016-05-10 DIAGNOSIS — I25119 Atherosclerotic heart disease of native coronary artery with unspecified angina pectoris: Secondary | ICD-10-CM | POA: Diagnosis not present

## 2016-05-10 DIAGNOSIS — Z6831 Body mass index (BMI) 31.0-31.9, adult: Secondary | ICD-10-CM | POA: Diagnosis not present

## 2016-05-10 DIAGNOSIS — I2510 Atherosclerotic heart disease of native coronary artery without angina pectoris: Secondary | ICD-10-CM

## 2016-05-10 DIAGNOSIS — Z7902 Long term (current) use of antithrombotics/antiplatelets: Secondary | ICD-10-CM | POA: Diagnosis not present

## 2016-05-10 DIAGNOSIS — I200 Unstable angina: Secondary | ICD-10-CM | POA: Diagnosis present

## 2016-05-10 DIAGNOSIS — E785 Hyperlipidemia, unspecified: Secondary | ICD-10-CM | POA: Diagnosis present

## 2016-05-10 HISTORY — DX: Acute myocardial infarction, unspecified: I21.9

## 2016-05-10 LAB — BASIC METABOLIC PANEL
Anion gap: 7 (ref 5–15)
BUN: 19 mg/dL (ref 6–20)
CO2: 26 mmol/L (ref 22–32)
Calcium: 9 mg/dL (ref 8.9–10.3)
Chloride: 105 mmol/L (ref 101–111)
Creatinine, Ser: 0.98 mg/dL (ref 0.61–1.24)
GFR calc Af Amer: >60 mL/min (ref >60)
GFR calc non Af Amer: >60 mL/min (ref >60)
Glucose, Bld: 111 mg/dL — ABNORMAL HIGH (ref 65–99)
Potassium: 3.7 mmol/L (ref 3.5–5.1)
Sodium: 138 mmol/L (ref 135–145)

## 2016-05-10 LAB — CBC
HCT: 40.2 % (ref 39.0–52.0)
Hemoglobin: 13.7 g/dL (ref 13.0–17.0)
MCH: 31.5 pg (ref 26.0–34.0)
MCHC: 34.1 g/dL (ref 30.0–36.0)
MCV: 92.4 fL (ref 78.0–100.0)
Platelets: 234 10*3/uL (ref 150–400)
RBC: 4.35 MIL/uL (ref 4.22–5.81)
RDW: 12.4 % (ref 11.5–15.5)
WBC: 7.4 10*3/uL (ref 4.0–10.5)

## 2016-05-10 LAB — HEPARIN LEVEL (UNFRACTIONATED)
Heparin Unfractionated: 0.75 IU/mL — ABNORMAL HIGH (ref 0.30–0.70)
Heparin Unfractionated: 0.53 IU/mL (ref 0.30–0.70)

## 2016-05-10 LAB — TROPONIN I
Troponin I: 0.62 ng/mL (ref <0.03)
Troponin I: 0.59 ng/mL (ref <0.03)
Troponin I: 0.45 ng/mL (ref <0.03)

## 2016-05-10 LAB — I-STAT TROPONIN, ED: Troponin i, poc: 0.74 ng/mL (ref 0.00–0.08)

## 2016-05-10 LAB — MRSA PCR SCREENING: MRSA by PCR: NEGATIVE

## 2016-05-10 MED ORDER — ONDANSETRON HCL 4 MG/2ML IJ SOLN
4.0000 mg | Freq: Once | INTRAMUSCULAR | Status: AC
  Administered 2016-05-10: 4 mg via INTRAVENOUS
  Filled 2016-05-10: qty 2

## 2016-05-10 MED ORDER — MORPHINE SULFATE (PF) 4 MG/ML IV SOLN
4.0000 mg | Freq: Once | INTRAVENOUS | Status: AC
Start: 2016-05-10 — End: 2016-05-10
  Administered 2016-05-10: 4 mg via INTRAVENOUS
  Filled 2016-05-10: qty 1

## 2016-05-10 MED ORDER — ASPIRIN EC 81 MG PO TBEC: 81.0000 mg | DELAYED_RELEASE_TABLET | Freq: Every day | ORAL | Status: DC

## 2016-05-10 MED ORDER — NITROGLYCERIN 0.4 MG SL SUBL: 0.4000 mg | SUBLINGUAL_TABLET | SUBLINGUAL | Status: DC | PRN

## 2016-05-10 MED ORDER — ACETAMINOPHEN 325 MG PO TABS: 650.0000 mg | ORAL_TABLET | ORAL | Status: DC | PRN

## 2016-05-10 MED ORDER — ASPIRIN 81 MG PO TABS
81.0000 mg | ORAL_TABLET | Freq: Every day | ORAL | Status: DC
Start: 2016-05-10 — End: 2016-05-10

## 2016-05-10 MED ORDER — NITROGLYCERIN IN D5W 200-5 MCG/ML-% IV SOLN
0.0000 ug/min | INTRAVENOUS | Status: DC
  Administered 2016-05-10: 5 ug/min via INTRAVENOUS
  Filled 2016-05-10: qty 250

## 2016-05-10 MED ORDER — NITROGLYCERIN 0.4 MG SL SUBL
0.4000 mg | SUBLINGUAL_TABLET | SUBLINGUAL | Status: DC | PRN
  Administered 2016-05-10 (×2): 0.4 mg via SUBLINGUAL
  Filled 2016-05-10 (×2): qty 1

## 2016-05-10 MED ORDER — HEPARIN (PORCINE) IN NACL 100-0.45 UNIT/ML-% IJ SOLN
900.0000 [IU]/h | INTRAMUSCULAR | Status: DC
  Administered 2016-05-10: 1000 [IU]/h via INTRAVENOUS
  Filled 2016-05-10 (×3): qty 250

## 2016-05-10 MED ORDER — ATORVASTATIN CALCIUM 80 MG PO TABS
80.0000 mg | ORAL_TABLET | Freq: Every day | ORAL | Status: DC
  Administered 2016-05-10 – 2016-05-12 (×3): 80 mg via ORAL
  Filled 2016-05-10 (×3): qty 1

## 2016-05-10 MED ORDER — ASPIRIN EC 81 MG PO TBEC
81.0000 mg | DELAYED_RELEASE_TABLET | Freq: Every day | ORAL | Status: DC
  Administered 2016-05-11 – 2016-05-12 (×2): 81 mg via ORAL
  Filled 2016-05-10 (×2): qty 1

## 2016-05-10 MED ORDER — ONDANSETRON HCL 4 MG/2ML IJ SOLN: 4.0000 mg | Freq: Four times a day (QID) | INTRAMUSCULAR | Status: DC | PRN

## 2016-05-10 MED ORDER — NITROGLYCERIN 0.4 MG SL SUBL
0.4000 mg | SUBLINGUAL_TABLET | SUBLINGUAL | Status: DC | PRN
Start: 2016-05-10 — End: 2016-05-10

## 2016-05-10 MED ORDER — ASPIRIN 81 MG PO CHEW: 324.0000 mg | CHEWABLE_TABLET | ORAL | Status: AC

## 2016-05-10 MED ORDER — NITROGLYCERIN 2 % TD OINT
1.0000 [in_us] | TOPICAL_OINTMENT | Freq: Once | TRANSDERMAL | Status: AC
  Administered 2016-05-10: 1 [in_us] via TOPICAL
  Filled 2016-05-10: qty 1

## 2016-05-10 MED ORDER — METOPROLOL TARTRATE 12.5 MG HALF TABLET
12.5000 mg | ORAL_TABLET | Freq: Two times a day (BID) | ORAL | Status: DC
  Administered 2016-05-10 – 2016-05-11 (×3): 12.5 mg via ORAL
  Filled 2016-05-10 (×3): qty 1

## 2016-05-10 MED ORDER — ASPIRIN 300 MG RE SUPP
300.0000 mg | RECTAL | Status: AC
Start: 2016-05-10 — End: 2016-05-11

## 2016-05-10 MED ORDER — TICAGRELOR 90 MG PO TABS
90.0000 mg | ORAL_TABLET | Freq: Two times a day (BID) | ORAL | Status: DC
  Administered 2016-05-10 – 2016-05-12 (×5): 90 mg via ORAL
  Filled 2016-05-10 (×5): qty 1

## 2016-05-10 MED ORDER — HEPARIN BOLUS VIA INFUSION
4000.0000 [IU] | Freq: Once | INTRAVENOUS | Status: AC
  Administered 2016-05-10: 4000 [IU] via INTRAVENOUS
  Filled 2016-05-10: qty 4000

## 2016-05-10 NOTE — ED Notes (Signed)
Pt c/o new chest tightness and pressure. Dr. Gilford Raid aware, repeat EKG performed.

## 2016-05-10 NOTE — Consult Note (Signed)
Cardiology Consult    Patient ID: Michael [name] MRN: [id number], DOB/AGE: March 30, 1956   Admit date: 05/10/2016 Date of Consult: 05/10/2016  Primary Physician: Nyoka Cowden, MD Reason for Consult: chest pain Primary Cardiologist: Dr. Percival Spanish Requesting Provider: Dr. Gilford Raid  Patient Profile  Michael [name] is a 60 year old male with a past medical history of CAD, HTN, HLD, and obesity. He presented to the ED on 05/10/16 with chest pain.   History of Present Illness  Michael [name] has a history of CAD, last cath was 4 days ago on 05/06/16. He was admitted from 05/05/16-05/07/16 with chest pain, left arm discomfort and SOB. At that time he had significant mid to distal LAD stenosis that was treated with 2 DES overlapping stents. He also had a 70% 2nd diagonal lesion, patent stents in the mid left circumflex, patent stent in ostial RCA, and patent stent in mid/distal RCA.  Yesterday at 4pm he developed acute onset of substernal chest pain. He took 2 sublingual nitroglycerin and the pain subsided. At 11 PM he began having chest pressure that was reminiscent of his previous angina with associated left arm pain and numbness and shortness of breath. He took one sublingual nitroglycerin and moderately relieved his pain. But he decided to come to the emergency room. All night he has had intermittent pain that required sublingual nitroglycerin for relief. He tells me that one to 2 hours ago he developed the most severe chest pain that he's had. It is chest pressure that radiates to his left arm with associated shortness of breath.  His troponin was initially 0.62, the next one is 0.59. EKG shows sinus bradycardia with a rate of 58 with no ST elevation. No significant change from previous EKGs.  He is still having chest pain at the time of my encounter. He reports taking his Brilinta and aspirin with high compliance. He has not missed any doses.   Past Medical History   Past Medical  History:  Diagnosis Date  . CAD S/P PCI to Cx & RCA    a. s/p PCI of LCx (2006) and ostium of RCA (2007) as well as DES to RCA in [place] (05/2014)  . HTN (hypertension)   . Hyperlipidemia   . Obesity     Past Surgical History:  Procedure Laterality Date  . CARDIAC CATHETERIZATION N/A 05/06/2016   Procedure: Left Heart Cath and Coronary Angiography;  Surgeon: Nelva Bush, MD;  Location: [place] [facility];  Service: Cardiovascular;  Laterality: N/A;  . CARDIAC CATHETERIZATION N/A 05/06/2016   Procedure: Coronary Stent Intervention;  Surgeon: Nelva Bush, MD;  Location: [place] [facility];  Service: Cardiovascular;  Laterality: N/A;  . CORONARY ANGIOPLASTY WITH STENT PLACEMENT  2006   in [place], occluded CFX, s/p Taxus stent  . CORONARY ANGIOPLASTY WITH STENT PLACEMENT  2007   in [place], ostial RCA stent  . CORONARY ANGIOPLASTY WITH STENT PLACEMENT  2015   in [place], RCA stent  . TONSILLECTOMY AND ADENOIDECTOMY        Allergies  No Known Allergies  Inpatient Medications       Family History    Family History  Problem Relation Age of Onset  . Hypertension Father   . Dementia Father     Vascular  . Heart disease Maternal Grandfather 36    Died probably of heart diseasse  . Heart disease Maternal Uncle 36    Died of "heart exploding"    Social History    Social History  Social History  . Marital status: Married    Spouse name: N/A  . Number of children: 4  . Years of education: N/A   Occupational History  . Not on file.   Social History Main Topics  . Smoking status: Never Smoker  . Smokeless tobacco: Never Used  . Alcohol use No  . Drug use: No  . Sexual activity: Not on file   Other Topics Concern  . 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  Oldest daughter has CP     Review of Systems    General:  No chills, fever, night sweats or weight changes.  Cardiovascular:  + chest pain,  dyspnea on exertion, edema, orthopnea, palpitations, paroxysmal nocturnal dyspnea. Dermatological: No rash, lesions/masses Respiratory: No cough, dyspnea Urologic: No hematuria, dysuria Abdominal:   No nausea, vomiting, diarrhea, bright red blood per rectum, melena, or hematemesis Neurologic:  No visual changes, wkns, changes in mental status. All other systems reviewed and are otherwise negative except as noted above.  Physical Exam    Blood pressure (!) 103/48, pulse 69, temperature 97.7 F (36.5 C), temperature source Oral, resp. rate 16, height 5\' 9"  (1.753 m), weight 196 lb (88.9 kg), SpO2 98 %.  General: Pleasant, NAD Psych: Normal affect. Neuro: Alert and oriented X 3. Moves all extremities spontaneously. HEENT: Normal  Neck: Supple without bruits or JVD. Lungs:  Resp regular and unlabored, CTA. Heart: RRR no s3, s4, or murmurs. Abdomen: Soft, non-tender, non-distended, BS + x 4.  Extremities: No clubbing, cyanosis or edema. DP/PT/Radials 2+ and equal bilaterally.  Labs    Troponin [facility] Test)  Recent Labs  05/10/16 0124  TROPIPOC 0.74*    Recent Labs  05/10/16 0124 05/10/16 0521  TROPONINI 0.62* 0.59*   Lab Results  Component Value Date   WBC 7.4 05/10/2016   HGB 13.7 05/10/2016   HCT 40.2 05/10/2016   MCV 92.4 05/10/2016   PLT 234 05/10/2016    Recent Labs Lab 05/06/16 0448  05/10/16 0108  NA 139  < > 138  K 3.8  < > 3.7  CL 105  < > 105  CO2 27  < > 26  BUN 14  < > 19  CREATININE 1.07  < > 0.98  CALCIUM 9.0  < > 9.0  PROT 5.6*  --   --   BILITOT 2.0*  --   --   ALKPHOS 64  --   --   ALT 25  --   --   AST 25  --   --   GLUCOSE 111*  < > 111*  < > = values in this interval not displayed. Lab Results  Component Value Date   CHOL 98 06/11/2015   HDL 43 06/11/2015   LDLCALC 43 06/11/2015   TRIG 62 06/11/2015   No results found for: [facility]   Radiology Studies    Dg Chest 2 View  Result Date: 05/10/2016 CLINICAL DATA:  Chest  tightness.  Stent placement 05/06/2016 EXAM: CHEST  2 VIEW COMPARISON:  05/05/2016 FINDINGS: The heart size and mediastinal contours are within normal limits. New left and potentially right coronary stent noted. Both lungs are clear. The visualized skeletal structures are unremarkable. IMPRESSION: No evidence of active disease. Electronically Signed   By: Monte Fantasia M.D.   On: 05/10/2016 01:34   Dg Chest 2 View  Result Date: 05/05/2016 CLINICAL DATA:  60 year old male with a history  of chest pain EXAM: CHEST  2 VIEW COMPARISON:  06/10/2015 FINDINGS: The heart size and mediastinal contours are within normal limits. Both lungs are clear. The visualized skeletal structures are unremarkable. IMPRESSION: No active cardiopulmonary disease. Signed, Dulcy Fanny. Earleen Newport, DO [facility] Radiology Electronically Signed   By: Corrie Mckusick D.O.   On: 05/05/2016 14:28    EKG & Cardiac Imaging    EKG: NSR, no acute ST changes   Echocardiogram: None  Assessment & Plan  1. Unstable angina: Presents with chest pain that occurred at rest with associated left arm discomfort and shortness of breath. He has an extensive history of coronary artery disease with recent stent placement to his LAD. He had 2 overlapping stents placed to his LAD 4 days ago. He has been taking Brilinta and aspirin and has not missed any doses. EKG without any acute changes. He is having chest pain currently. We will start on heparin and nitroglycerin glycerin drips. Continue to cycle troponins. He will likely need a repeat heart catheterization.   2. HTN: Currently hypotensive to normotensive. He is on 25 mg of metoprolol daily. Continue current regimen.  3. HLD: Continue high intensity statin. LDL last year was 43. Would repeat in the a.m.  Signed, Arbutus Leas, NP 05/10/2016, 8:02 AM Pager: [phone]

## 2016-05-10 NOTE — ED Notes (Signed)
Attempted to call report x2

## 2016-05-10 NOTE — Progress Notes (Signed)
ANTICOAGULATION CONSULT NOTE - Initial Consult  Pharmacy Consult for heparin Indication: chest pain/ACS  No Known Allergies  Patient Measurements: Height: 5\' 6"  (167.6 cm) Weight: 195 lb 8.8 oz (88.7 kg) IBW/kg (Calculated) : 63.8 Heparin Dosing Weight: 88 kg   Vital Signs: Temp: 97.5 F (36.4 C) (08/19 1600) Temp Source: Oral (08/19 1600) BP: 120/73 (08/19 1600) Pulse Rate: 64 (08/19 1600)  Labs:  Recent Labs  05/10/16 0108 05/10/16 0124 05/10/16 0521 05/10/16 1625  HGB 13.7  --   --   --   HCT 40.2  --   --   --   PLT 234  --   --   --   HEPARINUNFRC  --   --   --  0.75*  CREATININE 0.98  --   --   --   TROPONINI  --  0.62* 0.59*  --     Estimated Creatinine Clearance: 82.6 mL/min (by C-G formula based on SCr of 0.98 mg/dL).   Medical History: Past Medical History:  Diagnosis Date  . Anginal pain ([place])   . CAD S/P PCI to Cx & RCA    a. s/p PCI of LCx (2006) and ostium of RCA (2007) as well as DES to RCA in [place] (05/2014)  . HTN (hypertension)   . Hyperlipidemia   . Myocardial infarction ([place])   . Obesity     Assessment: 60 yo male with recent admission for cath and stent placement (x2) 8/14-8/16. Pt presents to ED today with CP. Pharmacy consulted to dose heparin. HL this afternoon is supratherapeutic at 0.75 on 1000 units/hr. No issues per RN. CBC stable.  Goal of Therapy:  Heparin level 0.3-0.7 units/ml Monitor platelets by anticoagulation protocol: Yes   Plan:  - Decrease heparin infusion to 900 units/hr - 6-hr HL @ 2330 - Daily HL, CBC - Monitor s/sx of bleeding  Shaden Higley L. Nicole Kindred, PharmD Clinical Pharmacist Pager: [phone] 05/10/2016 5:03 PM

## 2016-05-10 NOTE — ED Triage Notes (Signed)
Pt. reports left chest tightness/ discomfort onset 4 pm this afternoon , denies SOB , no nausea or diaphoresis , his cardiologist is Dr. Percival Spanish , history of CAD / Coronary stent placement .

## 2016-05-10 NOTE — ED Notes (Signed)
Attempted to call report to [facility]

## 2016-05-10 NOTE — ED Provider Notes (Signed)
[facility] DEPT Provider Note   CSN: [id number] Arrival date & time: 05/10/16  0037  By signing my name below, I, Higinio Plan, attest that this documentation has been prepared under the direction and in the presence of Isla Pence, MD . Electronically Signed: Higinio Plan, Scribe. 05/10/2016. 1:53 AM.  History   Chief Complaint Chief Complaint  Patient presents with  . Chest Pain   The history is provided by the patient. No language interpreter was used.   HPI Comments: Ralphael Trame is a 60 y.o. male with PMHx of CAD, HTN and HLD, who presents to the Emergency Department complaining of gradually improving, chest pain that began at ~4:00 PM this afternoon. Pt reports his chest pain is not as intense and feels like a "burning sensation" now in the ED. He states he took 3 NTG since the onset of his pain; he notes his first dose was at 6 PM and last dose was at 12:00 AM. Pt states he also took aspirin. He reports PSHx of cardiac catheterization and stent placement 3 days ago and states he was discharged 2 days ago. He notes he was prescribed brilinta post surgery. Pt states he did not contact his cardiologist prior to visiting the ED.  Past Medical History:  Diagnosis Date  . Anginal pain ([place])   . CAD S/P PCI to Cx & RCA    a. s/p PCI of LCx (2006) and ostium of RCA (2007) as well as DES to RCA in [place] (05/2014)  . HTN (hypertension)   . Hyperlipidemia   . Myocardial infarction ([place])   . Obesity     Patient Active Problem List   Diagnosis Date Noted  . Chest pain 05/10/2016  . Coronary artery disease involving native coronary artery of native heart with unstable angina pectoris ([place])   . Hypotension 05/07/2016  . CAD in native artery   . Unstable angina ([place])   . Chest pain with moderate risk of acute coronary syndrome 05/05/2016  . Obesity   . HTN (hypertension)   . CAD (coronary artery disease) 09/08/2014  . Hyperlipidemia 09/08/2014    Past Surgical History:    Procedure Laterality Date  . CARDIAC CATHETERIZATION N/A 05/06/2016   Procedure: Left Heart Cath and Coronary Angiography;  Surgeon: Nelva Bush, MD;  Location: [facility];  Service: Cardiovascular;  Laterality: N/A;  . CARDIAC CATHETERIZATION N/A 05/06/2016   Procedure: Coronary Stent Intervention;  Surgeon: Nelva Bush, MD;  Location: [facility];  Service: Cardiovascular;  Laterality: N/A;  . CORONARY ANGIOPLASTY WITH STENT PLACEMENT  2006   in [place], occluded CFX, s/p Taxus stent  . CORONARY ANGIOPLASTY WITH STENT PLACEMENT  2007   in [place], ostial RCA stent  . CORONARY ANGIOPLASTY WITH STENT PLACEMENT  2015   in [place], RCA stent  . TONSILLECTOMY AND ADENOIDECTOMY       Home Medications    Prior to Admission medications   Medication Sig Start Date End Date Taking? Authorizing Provider  aspirin 81 MG tablet Take 81 mg by mouth daily.   Yes Historical Provider, MD  atorvastatin (LIPITOR) 80 MG tablet Take 1 tablet (80 mg total) by mouth daily. 11/22/15  Yes Marletta Lor, MD  Cinnamon 500 MG capsule Take 500 mg by mouth 2 (two) times daily.    Yes Historical Provider, MD  metoprolol succinate (TOPROL-XL) 25 MG 24 hr tablet Take 0.5 tablets (12.5 mg total) by mouth daily. 05/07/16  Yes Charlie Pitter, PA-C  nitroGLYCERIN (NITROSTAT) 0.4 MG SL tablet Place 1 tablet (0.4 mg total) under the tongue every 5 (five) minutes as needed for chest pain (up to 3 doses). 05/07/16  Yes Dayna N Dunn, PA-C  ticagrelor (BRILINTA) 90 MG TABS tablet Take 1 tablet (90 mg total) by mouth 2 (two) times daily. 05/07/16  Yes Dayna N Dunn, PA-C    Family History Family History  Problem Relation Age of Onset  . Hypertension Father   . Dementia Father     Vascular  . Heart disease Maternal Grandfather 35    Died probably of heart diseasse  . Heart disease Maternal Uncle 10    Died of "heart exploding"    Social History Social History  Substance Use Topics  .  Smoking status: Never Smoker  . Smokeless tobacco: Never Used  . Alcohol use No    Allergies   Review of patient's allergies indicates no known allergies.  Review of Systems Review of Systems  Constitutional: Negative for fever.  Cardiovascular: Positive for chest pain.   Physical Exam Updated Vital Signs BP 120/68 (BP Location: Left Arm)   Pulse 65   Temp 97.7 F (36.5 C) (Oral)   Resp 18   Ht 5\' 9"  (1.753 m)   Wt 196 lb (88.9 kg)   SpO2 100%   BMI 28.94 kg/m   Physical Exam  Constitutional: He is oriented to person, place, and time. He appears well-developed and well-nourished.  HENT:  Head: Normocephalic and atraumatic.  Eyes: Conjunctivae are normal. Pupils are equal, round, and reactive to light. Right eye exhibits no discharge. Left eye exhibits no discharge. No scleral icterus.  Neck: Normal range of motion. No JVD present. No tracheal deviation present.  Pulmonary/Chest: Effort normal. No stridor.  Neurological: He is alert and oriented to person, place, and time. Coordination normal.  Psychiatric: He has a normal mood and affect. His behavior is normal. Judgment and thought content normal.  Nursing note and vitals reviewed.  ED Treatments / Results  Labs (all labs ordered are listed, but only abnormal results are displayed) Labs Reviewed  BASIC METABOLIC PANEL - Abnormal; Notable for the following:       Result Value   Glucose, Bld 111 (*)    All other components within normal limits  TROPONIN I - Abnormal; Notable for the following:    Troponin I 0.62 (*)    All other components within normal limits  TROPONIN I - Abnormal; Notable for the following:    Troponin I 0.59 (*)    All other components within normal limits  HEPARIN LEVEL (UNFRACTIONATED) - Abnormal; Notable for the following:    Heparin Unfractionated 0.75 (*)    All other components within normal limits  TROPONIN I - Abnormal; Notable for the following:    Troponin I 0.45 (*)    All other  components within normal limits  I-STAT TROPOININ, ED - Abnormal; Notable for the following:    Troponin i, poc 0.74 (*)    All other components within normal limits  MRSA PCR SCREENING  CBC  BASIC METABOLIC PANEL  LIPID PANEL  CBC  HEPARIN LEVEL (UNFRACTIONATED)  HEPARIN LEVEL (UNFRACTIONATED)  TROPONIN I  TROPONIN I    EKG  EKG Interpretation  Date/Time:  Saturday May 10 2016 00:47:08 EDT Ventricular Rate:  72 PR Interval:  144 QRS Duration: 98 QT Interval:  416 QTC Calculation: 455 R Axis:   78 Text Interpretation:  Normal sinus rhythm Normal ECG Confirmed by Aslin Farinas  MD, Almyra Free [id number]) on 05/10/2016 1:50:54 AM       Radiology Dg Chest 2 View  Result Date: 05/10/2016 CLINICAL DATA:  Chest tightness.  Stent placement 05/06/2016 EXAM: CHEST  2 VIEW COMPARISON:  05/05/2016 FINDINGS: The heart size and mediastinal contours are within normal limits. New left and potentially right coronary stent noted. Both lungs are clear. The visualized skeletal structures are unremarkable. IMPRESSION: No evidence of active disease. Electronically Signed   By: Monte Fantasia M.D.   On: 05/10/2016 01:34    Procedures Procedures  DIAGNOSTIC STUDIES:  Oxygen Saturation is 100% on RA, normal by my interpretation.    COORDINATION OF CARE:  1:51 AM Discussed treatment plan with pt at bedside and pt agreed to plan.  Medications Ordered in ED Medications  nitroGLYCERIN (NITROSTAT) SL tablet 0.4 mg (0.4 mg Sublingual Given 05/10/16 0701)  nitroGLYCERIN 50 mg in dextrose 5 % 250 mL (0.2 mg/mL) infusion (5 mcg/min Intravenous Rate/Dose Change 05/10/16 2325)  heparin ADULT infusion 100 units/mL (25000 units/264mL sodium chloride 0.45%) (900 Units/hr Intravenous Rate/Dose Verify 05/10/16 2000)  ticagrelor (BRILINTA) tablet 90 mg (90 mg Oral Given 05/10/16 2040)  atorvastatin (LIPITOR) tablet 80 mg (80 mg Oral Given 05/10/16 1830)  aspirin chewable tablet 324 mg (324 mg Oral Not Given 05/10/16 1515)     Or  aspirin suppository 300 mg ( Rectal See Alternative 05/10/16 1515)  acetaminophen (TYLENOL) tablet 650 mg (not administered)  ondansetron (ZOFRAN) injection 4 mg (not administered)  aspirin EC tablet 81 mg (not administered)  metoprolol tartrate (LOPRESSOR) tablet 12.5 mg (not administered)  nitroGLYCERIN (NITROGLYN) 2 % ointment 1 inch (1 inch Topical Given 05/10/16 0156)  morphine 4 MG/ML injection 4 mg (4 mg Intravenous Given 05/10/16 0730)  ondansetron (ZOFRAN) injection 4 mg (4 mg Intravenous Given 05/10/16 0730)  heparin bolus via infusion 4,000 Units (4,000 Units Intravenous Bolus from Bag 05/10/16 0926)    Initial Impression / Assessment and Plan / ED Course  I have reviewed the triage vital signs and the nursing notes.  Pertinent labs & imaging results that were available during my care of the patient were reviewed by me and considered in my medical decision making (see chart for details).  Clinical Course  Value Comment By Time  Troponin i, poc: (!!) 0.74 (Reviewed) Isla Pence, MD 08/19 [phone]   Pt d/w Dr. Raiford Simmonds initially and he requested a 2nd troponin to compare.  When the second troponin came back, the pt was d/w cardiology again and they will come see him.  The pt did develop cp while waiting and it went away again with nitro.  Cardiology decided to admit pt for obs.  I personally performed the services described in this documentation, which was scribed in my presence. The recorded information has been reviewed and is accurate.   Final Clinical Impressions(s) / ED Diagnoses   Final diagnoses:  Unstable angina [facility])    New Prescriptions Current Discharge Medication List       Isla Pence, MD 05/10/16 [phone]

## 2016-05-10 NOTE — ED Triage Notes (Signed)
Dr Gilford Raid given report of elevated triponin  Waiting for a bed to open

## 2016-05-10 NOTE — H&P (Signed)
Attestation signed by Sueanne Margarita, MD at 05/10/2016 8:48 PM  Patient seen and independently examined with Michael Booze, NP. We discussed all aspects of the encounter. I agree with the assessment and plan as stated above. Patient recently discharged from [facility] after admission for CP at which time he underwent cath showing mid to distal LAD stenosis treated with DES x 2 with residual 70% D2 and patent stents in the mid and distal RCA.  He did well after discharge until yesterday at 4pm and had recurrent CP identical to prior angina and took 2 SL NTG with resolution of CP.  Pain reoccurred at 11pm radiating into left arm with SOB and pain was again relieved with a SL NTG.  He came to ER and continued to have intermittent CP off and on.  Initial trop was 0.62 and second trop 0.59.  EKG shows no evidence of acute ischemic ST changes.  His exam is benign with normal lung exam, no murmur and intact and equal distal pulses.  Currently he complains of 1/10 CP.  Cardiology is now asked to admit patient for further workup of CP.  Will admit to tele bed and continue to cycle Trop.  Start IV Heparin gtt and NTG gtt.  Continue ASA/statin/BB and Brilinta.       Expand All Collapse All   [] Hide copied text [] Hover for attribution information    Cardiology Consult    Patient ID: Michael [name] MRN: [id number], DOB/AGE: 12-11-1955   Admit date: 05/10/2016 Date of Consult: 05/10/2016  Primary Physician: Nyoka Cowden, MD Reason for Consult: chest pain Primary Cardiologist: Dr. Percival Spanish Requesting Provider: Dr. Gilford Raid  Patient Profile  Mr. Michael [name] is a 60 year old male with a past medical history of CAD, HTN, HLD, and obesity. He presented to the ED on 05/10/16 with chest pain.   History of Present Illness  [name] has a history of CAD, last cath was 4 days ago on 05/06/16. He was admitted from 05/05/16-05/07/16 with chest pain, left arm discomfort and SOB. At that time he had  significant mid to distal LAD stenosis that was treated with 2 DES overlapping stents. He also had a 70% 2nd diagonal lesion, patent stents in the mid left circumflex, patent stent in ostial RCA, and patent stent in mid/distal RCA.  Yesterday at 4pm he developed acute onset of substernal chest pain. He took 2 sublingual nitroglycerin and the pain subsided. At 11 PM he began having chest pressure that was reminiscent of his previous angina with associated left arm pain and numbness and shortness of breath. He took one sublingual nitroglycerin and moderately relieved his pain. But he decided to come to the emergency room. All night he has had intermittent pain that required sublingual nitroglycerin for relief. He tells me that one to 2 hours ago he developed the most severe chest pain that he's had. It is chest pressure that radiates to his left arm with associated shortness of breath.  His troponin was initially 0.62, the next one is 0.59. EKG shows sinus bradycardia with a rate of 58 with no ST elevation. No significant change from previous EKGs.  He is still having chest pain at the time of my encounter. He reports taking his Brilinta and aspirin with high compliance. He has not missed any doses.   Past Medical History       Past Medical History:  Diagnosis Date  . CAD S/P PCI to Cx & RCA    a. s/p PCI of  LCx (2006) and ostium of RCA (2007) as well as DES to RCA in [place] (05/2014)  . HTN (hypertension)   . Hyperlipidemia   . Obesity          Past Surgical History:  Procedure Laterality Date  . CARDIAC CATHETERIZATION N/A 05/06/2016   Procedure: Left Heart Cath and Coronary Angiography;  Surgeon: Nelva Bush, MD;  Location: [facility];  Service: Cardiovascular;  Laterality: N/A;  . CARDIAC CATHETERIZATION N/A 05/06/2016   Procedure: Coronary Stent Intervention;  Surgeon: Nelva Bush, MD;  Location: [facility];  Service: Cardiovascular;  Laterality:  N/A;  . CORONARY ANGIOPLASTY WITH STENT PLACEMENT  2006   in [place], occluded CFX, s/p Taxus stent  . CORONARY ANGIOPLASTY WITH STENT PLACEMENT  2007   in [place], ostial RCA stent  . CORONARY ANGIOPLASTY WITH STENT PLACEMENT  2015   in [place], RCA stent  . TONSILLECTOMY AND ADENOIDECTOMY        Allergies  No Known Allergies  Inpatient Medications       Family History          Family History  Problem Relation Age of Onset  . Hypertension Father   . Dementia Father     Vascular  . Heart disease Maternal Grandfather 9    Died probably of heart diseasse  . Heart disease Maternal Uncle 58    Died of "heart exploding"    Social History    Social History        Social History  . Marital status: Married    Spouse name: N/A  . Number of children: 4  . Years of education: N/A      Occupational History  . Not on file.       Social History Main Topics  . Smoking status: Never Smoker  . Smokeless tobacco: Never Used  . Alcohol use No  . Drug use: No  . Sexual activity: Not on file       Other Topics Concern  . Not on file      Social History Narrative   Lives with wife.  Chief of Staff.  Oldest daughter has CP     Review of Systems    General:  No chills, fever, night sweats or weight changes.  Cardiovascular:  + chest pain, dyspnea on exertion, edema, orthopnea, palpitations, paroxysmal nocturnal dyspnea. Dermatological: No rash, lesions/masses Respiratory: No cough, dyspnea Urologic: No hematuria, dysuria Abdominal:   No nausea, vomiting, diarrhea, bright red blood per rectum, melena, or hematemesis Neurologic:  No visual changes, wkns, changes in mental status. All other systems reviewed and are otherwise negative except as noted above.  Physical Exam    Blood pressure (!) 103/48, pulse 69, temperature 97.7 F (36.5 C), temperature source Oral, resp. rate 16, height 5\' 9"  (1.753 m), weight 196  lb (88.9 kg), SpO2 98 %.  General: Pleasant, NAD Psych: Normal affect. Neuro: Alert and oriented X 3. Moves all extremities spontaneously. HEENT: Normal                     Neck: Supple without bruits or JVD. Lungs:  Resp regular and unlabored, CTA. Heart: RRR no s3, s4, or murmurs. Abdomen: Soft, non-tender, non-distended, BS + x 4.  Extremities: No clubbing, cyanosis or edema. DP/PT/Radials 2+ and equal bilaterally.  Labs    Troponin (Point of Care Test)  Recent Labs (last 2 labs)    Recent Labs  05/10/16 0124  TROPIPOC 0.74*  Recent Labs (last 2 labs)    Recent Labs  05/10/16 0124 05/10/16 0521  TROPONINI 0.62* 0.59*     Recent Labs       Lab Results  Component Value Date   WBC 7.4 05/10/2016   HGB 13.7 05/10/2016   HCT 40.2 05/10/2016   MCV 92.4 05/10/2016   PLT 234 05/10/2016      Recent Labs Lab 05/06/16 0448  05/10/16 0108  NA 139  < > 138  K 3.8  < > 3.7  CL 105  < > 105  CO2 27  < > 26  BUN 14  < > 19  CREATININE 1.07  < > 0.98  CALCIUM 9.0  < > 9.0  PROT 5.6*  --   --   BILITOT 2.0*  --   --   ALKPHOS 64  --   --   ALT 25  --   --   AST 25  --   --   GLUCOSE 111*  < > 111*  < > = values in this interval not displayed. Recent Labs       Lab Results  Component Value Date   CHOL 98 06/11/2015   HDL 43 06/11/2015   LDLCALC 43 06/11/2015   TRIG 62 06/11/2015     Recent Labs  No results found for: [facility]     Radiology Studies     Imaging Results  Dg Chest 2 View  Result Date: 05/10/2016 CLINICAL DATA:  Chest tightness.  Stent placement 05/06/2016 EXAM: CHEST  2 VIEW COMPARISON:  05/05/2016 FINDINGS: The heart size and mediastinal contours are within normal limits. New left and potentially right coronary stent noted. Both lungs are clear. The visualized skeletal structures are unremarkable. IMPRESSION: No evidence of active disease. Electronically Signed   By: Monte Fantasia M.D.   On: 05/10/2016 01:34    Dg Chest 2 View  Result Date: 05/05/2016 CLINICAL DATA:  60 year old male with a history of chest pain EXAM: CHEST  2 VIEW COMPARISON:  06/10/2015 FINDINGS: The heart size and mediastinal contours are within normal limits. Both lungs are clear. The visualized skeletal structures are unremarkable. IMPRESSION: No active cardiopulmonary disease. Signed, Dulcy Fanny. Earleen Newport, DO [facility] Electronically Signed   By: Corrie Mckusick D.O.   On: 05/05/2016 14:28     EKG & Cardiac Imaging    EKG: NSR, no acute ST changes   Echocardiogram: None  Assessment & Plan  1. Unstable angina: Presents with chest pain that occurred at rest with associated left arm discomfort and shortness of breath. He has an extensive history of coronary artery disease with recent stent placement to his LAD. He had 2 overlapping stents placed to his LAD 4 days ago. He has been taking Brilinta and aspirin and has not missed any doses. EKG without any acute changes. He is having chest pain currently. We will start on heparin and nitroglycerin glycerin drips. Continue to cycle troponins. He will likely need a repeat heart catheterization.   2. HTN: Currently hypotensive to normotensive. He is on 25 mg of metoprolol daily. Continue current regimen.  3. HLD: Continue high intensity statin. LDL last year was 43. Would repeat in the a.m.  Signed, Arbutus Leas, NP 05/10/2016, 8:02 AM Pager: [phone]     Cosigned by: Sueanne Margarita, MD at 05/10/2016 8:48 PM  Revision History  Routing History

## 2016-05-10 NOTE — Progress Notes (Signed)
ANTICOAGULATION CONSULT NOTE - Initial Consult  Pharmacy Consult for heparin Indication: chest pain/ACS  No Known Allergies  Patient Measurements: Height: 5\' 9"  (175.3 cm) Weight: 196 lb (88.9 kg) IBW/kg (Calculated) : 70.7 Heparin Dosing Weight: 88 kg   Vital Signs: Temp: 97.7 F (36.5 C) (08/19 0052) Temp Source: Oral (08/19 0052) BP: 106/60 (08/19 0835) Pulse Rate: 57 (08/19 0835)  Labs:  Recent Labs  05/10/16 0108 05/10/16 0124 05/10/16 0521  HGB 13.7  --   --   HCT 40.2  --   --   PLT 234  --   --   CREATININE 0.98  --   --   TROPONINI  --  0.62* 0.59*    Estimated Creatinine Clearance: 87.3 mL/min (by C-G formula based on SCr of 0.98 mg/dL).   Medical History: Past Medical History:  Diagnosis Date  . CAD S/P PCI to Cx & RCA    a. s/p PCI of LCx (2006) and ostium of RCA (2007) as well as DES to RCA in [place] (05/2014)  . HTN (hypertension)   . Hyperlipidemia   . Obesity     Assessment: 60 yo male with recent admission for cath and stent placement (x2) 8/14-8/16. Patient presents today with chest pain. Pharmacy consult to dose heparin. Patient taking ASA and Brilinta at home and endorsed compliance with this regimen. He reports he does not take any other oral anticoagulants. Hgb 13.7, plt 234. No overt signs or symptoms of bleeding on review of patient in ED.   Goal of Therapy:  Heparin level 0.3-0.7 units/ml Monitor platelets by anticoagulation protocol: Yes   Plan:  Heparin bolus 4000 units once Heparin gtt to start at 1000 units/hr Heparin level in 6 hours Daily heparin level and CBC Monitor for s/s bleeding   Argie Ramming, PharmD Pharmacy Resident  Pager [phone] 05/10/16 9:35 AM

## 2016-05-11 DIAGNOSIS — I25110 Atherosclerotic heart disease of native coronary artery with unstable angina pectoris: Principal | ICD-10-CM

## 2016-05-11 LAB — CBC
HCT: 40.8 % (ref 39.0–52.0)
Hemoglobin: 13.6 g/dL (ref 13.0–17.0)
MCH: 31.1 pg (ref 26.0–34.0)
MCHC: 33.3 g/dL (ref 30.0–36.0)
MCV: 93.2 fL (ref 78.0–100.0)
Platelets: 216 10*3/uL (ref 150–400)
RBC: 4.38 MIL/uL (ref 4.22–5.81)
RDW: 12.4 % (ref 11.5–15.5)
WBC: 8 10*3/uL (ref 4.0–10.5)

## 2016-05-11 LAB — BASIC METABOLIC PANEL
Anion gap: 4 — ABNORMAL LOW (ref 5–15)
BUN: 13 mg/dL (ref 6–20)
CO2: 25 mmol/L (ref 22–32)
Calcium: 9 mg/dL (ref 8.9–10.3)
Chloride: 109 mmol/L (ref 101–111)
Creatinine, Ser: 1.04 mg/dL (ref 0.61–1.24)
GFR calc Af Amer: >60 mL/min (ref >60)
GFR calc non Af Amer: >60 mL/min (ref >60)
Glucose, Bld: 105 mg/dL — ABNORMAL HIGH (ref 65–99)
Potassium: 4.3 mmol/L (ref 3.5–5.1)
Sodium: 138 mmol/L (ref 135–145)

## 2016-05-11 LAB — LIPID PANEL
Cholesterol: 103 mg/dL (ref 0–200)
HDL: 41 mg/dL (ref >40)
LDL Cholesterol: 47 mg/dL (ref 0–99)
Total CHOL/HDL Ratio: 2.5 RATIO
Triglycerides: 76 mg/dL (ref <150)
VLDL: 15 mg/dL (ref 0–40)

## 2016-05-11 LAB — HEPARIN LEVEL (UNFRACTIONATED): Heparin Unfractionated: 0.54 IU/mL (ref 0.30–0.70)

## 2016-05-11 LAB — TROPONIN I
Troponin I: 0.34 ng/mL (ref <0.03)
Troponin I: 0.31 ng/mL (ref <0.03)

## 2016-05-11 MED ORDER — ISOSORBIDE MONONITRATE ER 30 MG PO TB24
15.0000 mg | ORAL_TABLET | Freq: Every day | ORAL | Status: DC
  Administered 2016-05-11 – 2016-05-13 (×3): 15 mg via ORAL
  Filled 2016-05-11 (×3): qty 1

## 2016-05-11 NOTE — Progress Notes (Signed)
SUBJECTIVE: The patient is doing well today.  At this time, he denies chest pain, shortness of breath, or any new concerns.  Very anxious  . aspirin  324 mg Oral NOW   Or  . aspirin  300 mg Rectal NOW  . aspirin EC  81 mg Oral Daily  . atorvastatin  80 mg Oral q1800  . metoprolol tartrate  12.5 mg Oral BID  . ticagrelor  90 mg Oral BID   . heparin 900 Units/hr (05/10/16 2000)  . nitroGLYCERIN 10 mcg/min (05/11/16 0300)    OBJECTIVE: Physical Exam: Vitals:   05/11/16 0200 05/11/16 0300 05/11/16 0416 05/11/16 0800  BP: 109/62 120/76 107/69 114/69  Pulse: (!) 54 70 (!) 54 70  Resp: 15 (!) 21 12 13   Temp:   97.8 F (36.6 C) 97.7 F (36.5 C)  TempSrc:   Oral Oral  SpO2: 96% 100% 95% 94%  Weight:      Height:        Intake/Output Summary (Last 24 hours) at 05/11/16 1214 Last data filed at 05/11/16 1000  Gross per 24 hour  Intake           985.43 ml  Output             3000 ml  Net         -2014.57 ml    Telemetry reveals sinus rhythm  GEN- The patient is well appearing, alert and oriented x 3 today.   Head- normocephalic, atraumatic Eyes-  Sclera clear, conjunctiva pink Ears- hearing intact Oropharynx- clear Neck- supple,  Lungs- Clear to ausculation bilaterally, normal work of breathing Heart- Regular rate and rhythm, no murmurs, rubs or gallops, PMI not laterally displaced GI- soft, NT, ND, + BS Extremities- no clubbing, cyanosis, or edema Skin- no rash or lesion Psych- anxious Neuro- strength and sensation are intact  LABS: Basic Metabolic Panel:  Recent Labs  05/10/16 0108 05/11/16 0304  NA 138 138  K 3.7 4.3  CL 105 109  CO2 26 25  GLUCOSE 111* 105*  BUN 19 13  CREATININE 0.98 1.04  CALCIUM 9.0 9.0    CBC:  Recent Labs  05/10/16 0108 05/11/16 0304  WBC 7.4 8.0  HGB 13.7 13.6  HCT 40.2 40.8  MCV 92.4 93.2  PLT 234 216   Cardiac Enzymes:  Recent Labs  05/10/16 2100 05/11/16 0304 05/11/16 0829  TROPONINI 0.45* 0.34* 0.31*    Fasting Lipid Panel:  Recent Labs  05/11/16 0305  CHOL 103  HDL 41  LDLCALC 47  TRIG 76  CHOLHDL 2.5    ASSESSMENT AND PLAN:  Active Problems:   Hyperlipidemia   HTN (hypertension)   Unstable angina ([facility])   Chest pain  1. CAD Returns with chest pain after recent PCI of the LAD.  troponins are mildly elevated but flat and ekg is without dynamic change.  I do not suspect acute stent thrombosis.  Dr Radford Pax and I have discussed today and think that vasospasm is more likely.  Will continue medical therapy. He has weaned off of ntg and is improved. He and his wife are very anxious.  His wife states "I will not take him home if he is still having pain". Will continue to observe overnight. Will make NPO after midnight and tentatively place on cath schedule, though I think it would also be reasonable to follow conservatively.  I think final decision will need to be made in am.  2. HTN:  Stable No change required  today  3. HLD: Stable No change required today   Thompson Grayer, MD 05/11/2016 12:14 PM

## 2016-05-11 NOTE — Progress Notes (Signed)
ANTICOAGULATION CONSULT NOTE  Pharmacy Consult for heparin Indication: chest pain/ACS  No Known Allergies  Patient Measurements: Height: 5\' 6"  (167.6 cm) Weight: 195 lb 8.8 oz (88.7 kg) IBW/kg (Calculated) : 63.8 Heparin Dosing Weight: 88 kg   Vital Signs: Temp: 97.8 F (36.6 C) (08/19 2347) Temp Source: Oral (08/19 2347) BP: 107/77 (08/20 0000) Pulse Rate: 65 (08/20 0000)  Labs:  Recent Labs  05/10/16 0108 05/10/16 0124 05/10/16 0521 05/10/16 1625 05/10/16 2100 05/10/16 2310  HGB 13.7  --   --   --   --   --   HCT 40.2  --   --   --   --   --   PLT 234  --   --   --   --   --   HEPARINUNFRC  --   --   --  0.75*  --  0.53  CREATININE 0.98  --   --   --   --   --   TROPONINI  --  0.62* 0.59*  --  0.45*  --     Estimated Creatinine Clearance: 82.6 mL/min (by C-G formula based on SCr of 0.98 mg/dL).  Assessment: 60 y.o. male with chest pain for heparin   Goal of Therapy:  Heparin level 0.3-0.7 units/ml Monitor platelets by anticoagulation protocol: Yes   Plan:  Continue Heparin at current rate  Follow-up am labs.  Phillis Knack, PharmD, BCPS  05/11/2016 12:38 AM

## 2016-05-11 NOTE — Progress Notes (Signed)
ANTICOAGULATION CONSULT NOTE  Pharmacy Consult for Heparin Indication: chest pain/ACS  No Known Allergies  Patient Measurements: Height: 5\' 6"  (167.6 cm) Weight: 195 lb 8.8 oz (88.7 kg) IBW/kg (Calculated) : 63.8  Vital Signs: Temp: 97.7 F (36.5 C) (08/20 0800) Temp Source: Oral (08/20 0800) BP: 114/69 (08/20 0800) Pulse Rate: 70 (08/20 0800)  Labs:  Recent Labs  05/10/16 0108  05/10/16 0521 05/10/16 1625 05/10/16 2100 05/10/16 2310 05/11/16 0304 05/11/16 0439  HGB 13.7  --   --   --   --   --  13.6  --   HCT 40.2  --   --   --   --   --  40.8  --   PLT 234  --   --   --   --   --  216  --   HEPARINUNFRC  --   --   --  0.75*  --  0.53  --  0.54  CREATININE 0.98  --   --   --   --   --  1.04  --   TROPONINI  --   < > 0.59*  --  0.45*  --  0.34*  --   < > = values in this interval not displayed.  Estimated Creatinine Clearance: 77.9 mL/min (by C-G formula based on SCr of 1.04 mg/dL).   Medications:  Heparin gtt @ 900 units/hr  Assessment: 60 yo male with recent admission for cath and stent placement (x2) 8/14-8/16. Patient presents on 05/10/2016 with chest pain. Pharmacy consult to dose heparin. Patient taking ASA and Brilinta at home and endorsed compliance with this regimen. He reports he does not take any other oral anticoagulants.   HL this am remains at goal x 2. CBC wnl and stable with no overt signs or symptoms of bleeding.  Goal of Therapy:  Heparin level 0.3-0.7 units/ml Monitor platelets by anticoagulation protocol: Yes   Plan:  - Continue heparin gtt at 900 units/hr - Daily HL and CBC - Monitor for s/s bleeding - F/u repeat LHC  Sean Macwilliams K. Velva Harman, PharmD, BCPS, CPP Clinical Pharmacist Pager: [phone] Phone: [phone] 05/11/2016 8:52 AM

## 2016-05-12 ENCOUNTER — Encounter (HOSPITAL_COMMUNITY): Payer: Self-pay | Admitting: General Practice

## 2016-05-12 DIAGNOSIS — I10 Essential (primary) hypertension: Secondary | ICD-10-CM

## 2016-05-12 DIAGNOSIS — R072 Precordial pain: Secondary | ICD-10-CM

## 2016-05-12 LAB — CBC
HCT: 38.8 % — ABNORMAL LOW (ref 39.0–52.0)
Hemoglobin: 13.1 g/dL (ref 13.0–17.0)
MCH: 31.3 pg (ref 26.0–34.0)
MCHC: 33.8 g/dL (ref 30.0–36.0)
MCV: 92.8 fL (ref 78.0–100.0)
Platelets: 209 10*3/uL (ref 150–400)
RBC: 4.18 MIL/uL — ABNORMAL LOW (ref 4.22–5.81)
RDW: 12.5 % (ref 11.5–15.5)
WBC: 6.7 10*3/uL (ref 4.0–10.5)

## 2016-05-12 LAB — BASIC METABOLIC PANEL
Anion gap: 6 (ref 5–15)
BUN: 18 mg/dL (ref 6–20)
CO2: 29 mmol/L (ref 22–32)
Calcium: 9.1 mg/dL (ref 8.9–10.3)
Chloride: 104 mmol/L (ref 101–111)
Creatinine, Ser: 1.27 mg/dL — ABNORMAL HIGH (ref 0.61–1.24)
GFR calc Af Amer: >60 mL/min (ref >60)
GFR calc non Af Amer: 59 mL/min — ABNORMAL LOW (ref >60)
Glucose, Bld: 118 mg/dL — ABNORMAL HIGH (ref 65–99)
Potassium: 4.2 mmol/L (ref 3.5–5.1)
Sodium: 139 mmol/L (ref 135–145)

## 2016-05-12 LAB — HEPARIN LEVEL (UNFRACTIONATED): Heparin Unfractionated: 0.37 IU/mL (ref 0.30–0.70)

## 2016-05-12 MED ORDER — SODIUM CHLORIDE 0.9 % WEIGHT BASED INFUSION: 1.0000 mL/kg/h | INTRAVENOUS | Status: DC

## 2016-05-12 MED ORDER — SODIUM CHLORIDE 0.9 % IV SOLN
250.0000 mL | INTRAVENOUS | Status: DC | PRN
Start: 2016-05-12 — End: 2016-05-13

## 2016-05-12 MED ORDER — SODIUM CHLORIDE 0.9% FLUSH: 3.0000 mL | INTRAVENOUS | Status: DC | PRN

## 2016-05-12 MED ORDER — SODIUM CHLORIDE 0.9 % WEIGHT BASED INFUSION: 3.0000 mL/kg/h | INTRAVENOUS | Status: DC

## 2016-05-12 MED ORDER — SODIUM CHLORIDE 0.9 % WEIGHT BASED INFUSION: 3.0000 mL/kg/h | INTRAVENOUS | Status: AC

## 2016-05-12 MED ORDER — ASPIRIN 81 MG PO CHEW
81.0000 mg | CHEWABLE_TABLET | ORAL | Status: AC
  Administered 2016-05-13: 81 mg via ORAL
  Filled 2016-05-12: qty 1

## 2016-05-12 MED ORDER — SODIUM CHLORIDE 0.9% FLUSH: 3.0000 mL | Freq: Two times a day (BID) | INTRAVENOUS | Status: DC

## 2016-05-12 NOTE — Progress Notes (Signed)
RN notified cath lab was ready for patient, Notified them that patient had eaten in thoughts cath would be 8/22 related to orders. Cath lab notified us that he would be on schedule for 8/22 at 1030, will make patient aware. Notified NP. Will continue to monitor.

## 2016-05-12 NOTE — Progress Notes (Signed)
Patient Name: Michael [name] Date of Encounter: 05/12/2016  Hospital Problem List     Active Problems:   Hyperlipidemia   HTN (hypertension)   Unstable angina (HCC)   Chest pain    Subjective   No angina, dyspnea or chest tightness this morning. Appears slightly anxious, reports brief episode of tight sensation in his heart yesterday while at rest.   Inpatient Medications    . aspirin EC  81 mg Oral Daily  . atorvastatin  80 mg Oral q1800  . isosorbide mononitrate  15 mg Oral Daily  . metoprolol tartrate  12.5 mg Oral BID  . ticagrelor  90 mg Oral BID    Vital Signs    Vitals:   05/11/16 1300 05/11/16 1352 05/11/16 2053 05/12/16 0538  BP: 132/68 131/79 105/60 (!) 103/47  Pulse: 73 85 63 (!) 52  Resp: 16  16 16   Temp:  98 F (36.7 C) 97.7 F (36.5 C) 97.9 F (36.6 C)  TempSrc:  Oral Oral Oral  SpO2: 99% 100% 99% 99%  Weight:    193 lb 1.6 oz (87.6 kg)  Height:        Intake/Output Summary (Last 24 hours) at 05/12/16 0807 Last data filed at 05/12/16 0538  Gross per 24 hour  Intake               54 ml  Output              700 ml  Net             -646 ml   Filed Weights   05/10/16 0052 05/10/16 1543 05/12/16 0538  Weight: 196 lb (88.9 kg) 195 lb 8.8 oz (88.7 kg) 193 lb 1.6 oz (87.6 kg)    Physical Exam    General: Pleasant, NAD. Neuro: Alert and oriented X 3. Moves all extremities spontaneously. Psych: Normal affect. HEENT:  Normal  Neck: Supple without bruits or JVD. Lungs:  Resp regular and unlabored, CTA. Heart: RRR no s3, s4, or murmurs. Abdomen: Soft, non-tender, non-distended, BS + x 4.  Extremities: No clubbing, cyanosis or edema. DP/PT/Radials 2+ and equal bilaterally.  Labs    CBC  Recent Labs  05/11/16 0304 05/12/16 0553  WBC 8.0 6.7  HGB 13.6 13.1  HCT 40.8 38.8*  MCV 93.2 92.8  PLT 216 [id number]   Basic Metabolic Panel  Recent Labs  05/11/16 0304 05/12/16 0553  NA 138 139  K 4.3 4.2  CL 109 104  CO2 25 29  GLUCOSE 105*  118*  BUN 13 18  CREATININE 1.04 1.27*  CALCIUM 9.0 9.1   Liver Function Tests No results for input(s): AST, ALT, ALKPHOS, BILITOT, PROT, ALBUMIN in the last 72 hours. No results for input(s): LIPASE, AMYLASE in the last 72 hours. Cardiac Enzymes  Recent Labs  05/10/16 2100 05/11/16 0304 05/11/16 0829  TROPONINI 0.45* 0.34* 0.31*   BNP Invalid input(s): POCBNP D-Dimer No results for input(s): DDIMER in the last 72 hours. Hemoglobin A1C No results for input(s): HGBA1C in the last 72 hours. Fasting Lipid Panel  Recent Labs  05/11/16 0305  CHOL 103  HDL 41  LDLCALC 47  TRIG 76  CHOLHDL 2.5   Thyroid Function Tests No results for input(s): TSH, T4TOTAL, T3FREE, THYROIDAB in the last 72 hours.  Invalid input(s): FREET3  Telemetry    SB  ECG    SB with no acute ST/TW abnormalities  Radiology      Assessment & Plan  60 yo male with CAD and PCI with DES x2 to distal LAD stenosis with residual 70% D2 and patent stents in the mid and distal RCA on 05/06/2016, HTN and HLD who presented to the ED on 05/10/2016 with complaints of chest pain associated with left arm discomfort and dyspnea.   1. Chest pain: trop were initially mildly elevated on admission, but with downward trend. EKG remains without acute change. He was started on IV nitro and heparin on admission, and has since been able to wean nitro. No reports of chest tightness this morning. Plan was continue with medical therapy over the weekend, and continue with observation. Pain thought to be more likely related to vasospasm.  -- He was made NPO last night in the event that decision was made to cath. Will discuss with MD regarding further treatment this morning.   2. HTN: Stable with current medications  3. HLD: Continue statin  Signed, Reino Bellis NP-C Pager [phone]  As above, patient seen and examined. He has had recurrent chest pain. Troponin is minimally elevated. Electrocardiogram shows no ST  changes. He is extremely concerned about his symptoms. He had recent PCI. We will plan to proceed with cardiac catheterization for definitive evaluation. The risks and benefits were discussed including stroke, myocardial infarction and death. He agrees to proceed. Continue heparin, aspirin, statin and brilinta; discontinue metoprolol given bradycardia.  Kirk Ruths, MD

## 2016-05-12 NOTE — Progress Notes (Signed)
ANTICOAGULATION CONSULT NOTE  Pharmacy Consult for Heparin Indication: chest pain/ACS  No Known Allergies  Patient Measurements: Height: 5\' 6"  (167.6 cm) Weight: 193 lb 1.6 oz (87.6 kg) IBW/kg (Calculated) : 63.8  Vital Signs: Temp: 98.1 F (36.7 C) (08/21 1426) Temp Source: Oral (08/21 1426) BP: 92/59 (08/21 1426) Pulse Rate: 89 (08/21 1426)  Labs:  Recent Labs  05/10/16 0108  05/10/16 2100 05/10/16 2310 05/11/16 0304 05/11/16 0439 05/11/16 0829 05/12/16 0553  HGB 13.7  --   --   --  13.6  --   --  13.1  HCT 40.2  --   --   --  40.8  --   --  38.8*  PLT 234  --   --   --  216  --   --  209  HEPARINUNFRC  --   < >  --  0.53  --  0.54  --  0.37  CREATININE 0.98  --   --   --  1.04  --   --  1.27*  TROPONINI  --   < > 0.45*  --  0.34*  --  0.31*  --   < > = values in this interval not displayed.  Estimated Creatinine Clearance: 63.3 mL/min (by C-G formula based on SCr of 1.27 mg/dL).   Medications:  Heparin gtt @ 900 units/hr  Assessment: 60 yo male with recent admission for cath and stent placement (x2) 8/14-8/16. Patient presented on 05/10/2016 with chest pain. Pharmacy consulted to dose heparin. Patient was taking ASA and Brilinta at home and endorsed compliance with this regimen. He reported no other oral anticoagulants taken pta.   Heparin level today remains therapeutic at 0.37 on heparin drip rate 900 units/hr. CBC wnl and stable with no overt signs or symptoms of bleeding.  Goal of Therapy:  Heparin level 0.3-0.7 units/ml Monitor platelets by anticoagulation protocol: Yes   Plan:  - Continue heparin gtt at 900 units/hr - Daily HL and CBC - Monitor for s/s bleeding - F/u repeat LHC tomorrow 8/22.   Nicole Cella, RPh Clinical Pharmacist Pager: [phone] 05/12/2016 4:56 PM

## 2016-05-13 ENCOUNTER — Encounter (HOSPITAL_COMMUNITY): Payer: Self-pay | Admitting: Cardiology

## 2016-05-13 ENCOUNTER — Encounter (HOSPITAL_COMMUNITY)
Admission: EM | Disposition: A | Discharge status: Home / Self Care | Payer: Self-pay | Source: Home / Self Care | Attending: Cardiology

## 2016-05-13 DIAGNOSIS — I25119 Atherosclerotic heart disease of native coronary artery with unspecified angina pectoris: Secondary | ICD-10-CM

## 2016-05-13 HISTORY — PX: CARDIAC CATHETERIZATION: SHX172

## 2016-05-13 LAB — CBC
HCT: 37.5 % — ABNORMAL LOW (ref 39.0–52.0)
Hemoglobin: 12.8 g/dL — ABNORMAL LOW (ref 13.0–17.0)
MCH: 31.2 pg (ref 26.0–34.0)
MCHC: 34.1 g/dL (ref 30.0–36.0)
MCV: 91.5 fL (ref 78.0–100.0)
Platelets: 213 10*3/uL (ref 150–400)
RBC: 4.1 MIL/uL — ABNORMAL LOW (ref 4.22–5.81)
RDW: 12.4 % (ref 11.5–15.5)
WBC: 8.1 10*3/uL (ref 4.0–10.5)

## 2016-05-13 LAB — BASIC METABOLIC PANEL
Anion gap: 4 — ABNORMAL LOW (ref 5–15)
BUN: 17 mg/dL (ref 6–20)
CO2: 29 mmol/L (ref 22–32)
Calcium: 9.1 mg/dL (ref 8.9–10.3)
Chloride: 106 mmol/L (ref 101–111)
Creatinine, Ser: 0.93 mg/dL (ref 0.61–1.24)
GFR calc Af Amer: >60 mL/min (ref >60)
GFR calc non Af Amer: >60 mL/min (ref >60)
Glucose, Bld: 105 mg/dL — ABNORMAL HIGH (ref 65–99)
Potassium: 4.1 mmol/L (ref 3.5–5.1)
Sodium: 139 mmol/L (ref 135–145)

## 2016-05-13 LAB — PROTIME-INR
INR: 1.02
Prothrombin Time: 13.4 seconds (ref 11.4–15.2)

## 2016-05-13 LAB — HEPARIN LEVEL (UNFRACTIONATED): Heparin Unfractionated: 0.42 IU/mL (ref 0.30–0.70)

## 2016-05-13 SURGERY — CORONARY/GRAFT ANGIOGRAPHY

## 2016-05-13 MED ORDER — VERAPAMIL HCL 2.5 MG/ML IV SOLN
INTRAVENOUS | Status: AC
  Filled 2016-05-13: qty 2

## 2016-05-13 MED ORDER — IOPAMIDOL (ISOVUE-370) INJECTION 76%
INTRAVENOUS | Status: DC | PRN
  Administered 2016-05-13: 50 mL via INTRA_ARTERIAL

## 2016-05-13 MED ORDER — SODIUM CHLORIDE 0.9 % WEIGHT BASED INFUSION
1.0000 mL/kg/h | INTRAVENOUS | Status: AC
Start: 2016-05-13 — End: 2016-05-13

## 2016-05-13 MED ORDER — HEPARIN (PORCINE) IN NACL 2-0.9 UNIT/ML-% IJ SOLN
INTRAMUSCULAR | Status: DC | PRN
Start: 2016-05-13 — End: 2016-05-13
  Administered 2016-05-13: 1000 mL

## 2016-05-13 MED ORDER — SODIUM CHLORIDE 0.9% FLUSH: 3.0000 mL | Freq: Two times a day (BID) | INTRAVENOUS | Status: DC

## 2016-05-13 MED ORDER — LIDOCAINE HCL (PF) 1 % IJ SOLN
INTRAMUSCULAR | Status: AC
  Filled 2016-05-13: qty 30

## 2016-05-13 MED ORDER — SODIUM CHLORIDE 0.9 % IV SOLN: 250.0000 mL | INTRAVENOUS | Status: DC | PRN

## 2016-05-13 MED ORDER — FENTANYL CITRATE (PF) 100 MCG/2ML IJ SOLN
INTRAMUSCULAR | Status: AC
  Filled 2016-05-13: qty 2

## 2016-05-13 MED ORDER — MIDAZOLAM HCL 2 MG/2ML IJ SOLN
INTRAMUSCULAR | Status: DC | PRN
  Administered 2016-05-13: 2 mg via INTRAVENOUS

## 2016-05-13 MED ORDER — VERAPAMIL HCL 2.5 MG/ML IV SOLN
INTRAVENOUS | Status: DC | PRN
  Administered 2016-05-13: 10 mL via INTRA_ARTERIAL

## 2016-05-13 MED ORDER — HEPARIN (PORCINE) IN NACL 2-0.9 UNIT/ML-% IJ SOLN
INTRAMUSCULAR | Status: AC
  Filled 2016-05-13: qty 1000

## 2016-05-13 MED ORDER — ISOSORBIDE MONONITRATE ER 30 MG PO TB24: 15.0000 mg | ORAL_TABLET | Freq: Every day | ORAL | 11 refills | Status: DC

## 2016-05-13 MED ORDER — FENTANYL CITRATE (PF) 100 MCG/2ML IJ SOLN
INTRAMUSCULAR | Status: DC | PRN
Start: 2016-05-13 — End: 2016-05-13
  Administered 2016-05-13: 25 ug via INTRAVENOUS

## 2016-05-13 MED ORDER — MIDAZOLAM HCL 2 MG/2ML IJ SOLN
INTRAMUSCULAR | Status: AC
  Filled 2016-05-13: qty 2

## 2016-05-13 MED ORDER — TICAGRELOR 90 MG PO TABS
ORAL_TABLET | ORAL | Status: DC | PRN
  Administered 2016-05-13: 90 mg via ORAL

## 2016-05-13 MED ORDER — SODIUM CHLORIDE 0.9% FLUSH: 3.0000 mL | INTRAVENOUS | Status: DC | PRN

## 2016-05-13 MED ORDER — HEPARIN SODIUM (PORCINE) 1000 UNIT/ML IJ SOLN
INTRAMUSCULAR | Status: AC
  Filled 2016-05-13: qty 1

## 2016-05-13 MED ORDER — LIDOCAINE HCL (PF) 1 % IJ SOLN
INTRAMUSCULAR | Status: DC | PRN
  Administered 2016-05-13: 2 mL

## 2016-05-13 MED ORDER — HEPARIN SODIUM (PORCINE) 1000 UNIT/ML IJ SOLN
INTRAMUSCULAR | Status: DC | PRN
  Administered 2016-05-13: 4500 [IU] via INTRAVENOUS

## 2016-05-13 SURGICAL SUPPLY — 9 items
CATH INFINITI 5 FR JL3.5 (CATHETERS) ×2 IMPLANT
CATH INFINITI JR4 5F (CATHETERS) ×2 IMPLANT
DEVICE RAD COMP TR BAND LRG (VASCULAR PRODUCTS) ×2 IMPLANT
GLIDESHEATH SLEND SS 6F .021 (SHEATH) ×2 IMPLANT
KIT HEART LEFT (KITS) ×2 IMPLANT
PACK CARDIAC CATHETERIZATION (CUSTOM PROCEDURE TRAY) ×2 IMPLANT
TRANSDUCER W/STOPCOCK (MISCELLANEOUS) ×2 IMPLANT
TUBING CIL FLEX 10 FLL-RA (TUBING) ×2 IMPLANT
WIRE SAFE-T 1.5MM-J .035X260CM (WIRE) ×2 IMPLANT

## 2016-05-13 NOTE — Discharge Summary (Signed)
Discharge Summary    Patient ID: Michael [name],  MRN: [id number], DOB/AGE: 05-04-1956 60 y.o.  Admit date: 05/10/2016 Discharge date: 05/13/2016  Primary Care Provider: Nyoka Cowden Primary Cardiologist: Dr. Percival Spanish  Discharge Diagnoses    Active Problems:   Hyperlipidemia   HTN (hypertension)   Unstable angina [facility])   Chest pain   Allergies No Known Allergies  Diagnostic Studies/Procedures    LHC: 05/13/2016  Conclusion     Ost RPDA lesion, 50 %stenosed.  Ost RCA to Prox RCA lesion, 0 %stenosed.  Mid RCA to Dist RCA lesion, 0 %stenosed.  Prox Cx to Mid Cx lesion, 0 %stenosed.  Mid Cx lesion, 40 %stenosed.  2nd Diag lesion, 70 %stenosed.  Dist LAD lesion, 0 %stenosed.   1. Nonobstructive CAD except for a small second diagonal branch. Stents in the mid LAD are widely patent. No loss of side branches.  Plan: continue medical management.  _____________   History of Present Illness     Mr. Michael [name] has a history of CAD, last cath was on 05/06/16. He was admitted from 05/05/16-05/07/16 with chest pain, left arm discomfort and SOB. At that time he had significant mid to distal LAD stenosis that was treated with 2 DES overlapping stents. He also had a 70% 2nd diagonal lesion, patent stents in the mid left circumflex, patent stent in ostial RCA, and patent stent in mid/distal RCA.  On 05/09/2016 at 4pm he developed acute onset of substernal chest pain. He took 2 sublingual nitroglycerin and the pain subsided. At 11 PM he began having chest pressure that was reminiscent of his previous angina with associated left arm pain and numbness and shortness of breath. He took one sublingual nitroglycerin and moderately relieved his pain. But he decided to come to the emergency room. All night he has had intermittent pain that required sublingual nitroglycerin for relief. He tells me that one to 2 hours ago he developed the most severe chest pain that he's had.  It is chest pressure that radiates to his left arm with associated shortness of breath.  His troponin was initially 0.62, the next one is 0.59. EKG shows sinus bradycardia with a rate of 58 with no ST elevation. No significant change from previous EKGs.  He is still having chest pain while in the ED at the time of encounter. He reports taking his Brilintaand aspirin with high compliance. He has not missed any doses.  Hospital Course     Consultants: None   Mr. Horita was admitted to our service with a plan to cycle troponins, and start IV heparin drip along with IV nitroglycerin. His aspirin, statin, Brilinta and beta blocker were continued. On the following day and and denied any further chest pain. His troponins were mildly elevated but flat, and EKG was without any dynamic change. It was thought that his chest pain was likely related to vasospasm. He was weaned off the nitroglycerin drip. The decision was made to make him nothing by mouth after midnight and tentatively place for cath the following day.  On 8/21 the decision was made to proceed with possible heart catheterization if availability in the cath lab. Patient was scheduled to go that afternoon but unfortunately ate prior to procedure which pushed him to the following day. He was continued on heparin, aspirin, statin and Brilinta. But his metoprolol was discontinued given his bradycardia noted on telemetry.  On 8/22 he underwent left heart cath with Dr. Martinique showing nonobstructive CAD except for second  diagonal branch, with stents in the mid LAD widely patent and no loss of side branches. Plan at that time was to continue medical management. He was placed on low-dose Imdur post cath. He was seen and assessed post cath and determined stable for discharge home. I have instructed him to hold his metoprolol, and start taking the Imdur. He has follow-up arranged in the office within 2 weeks. Have also given him instructions regarding  his restrictions, and given him a note fact that he has not been cleared to fly at this time. Note his right radial cath site was stable without hematoma or bruising at the time of discharge.  _____________  Discharge Vitals Blood pressure 112/69, pulse 61, temperature 98 F (36.7 C), temperature source Oral, resp. rate 12, height 5\' 6"  (1.676 m), weight 194 lb 9.6 oz (88.3 kg), SpO2 100 %.  Filed Weights   05/10/16 1543 05/12/16 0538 05/13/16 0457  Weight: 195 lb 8.8 oz (88.7 kg) 193 lb 1.6 oz (87.6 kg) 194 lb 9.6 oz (88.3 kg)    Labs & Radiologic Studies    CBC  Recent Labs  05/12/16 0553 05/13/16 0408  WBC 6.7 8.1  HGB 13.1 12.8*  HCT 38.8* 37.5*  MCV 92.8 91.5  PLT 209 [id number]   Basic Metabolic Panel  Recent Labs  05/12/16 0553 05/13/16 1027  NA 139 139  K 4.2 4.1  CL 104 106  CO2 29 29  GLUCOSE 118* 105*  BUN 18 17  CREATININE 1.27* 0.93  CALCIUM 9.1 9.1   Liver Function Tests No results for input(s): AST, ALT, ALKPHOS, BILITOT, PROT, ALBUMIN in the last 72 hours. No results for input(s): LIPASE, AMYLASE in the last 72 hours. Cardiac Enzymes  Recent Labs  05/10/16 2100 05/11/16 0304 05/11/16 0829  TROPONINI 0.45* 0.34* 0.31*   BNP Invalid input(s): POCBNP D-Dimer No results for input(s): DDIMER in the last 72 hours. Hemoglobin A1C No results for input(s): HGBA1C in the last 72 hours. Fasting Lipid Panel  Recent Labs  05/11/16 0305  CHOL 103  HDL 41  LDLCALC 47  TRIG 76  CHOLHDL 2.5   Thyroid Function Tests No results for input(s): TSH, T4TOTAL, T3FREE, THYROIDAB in the last 72 hours.  Invalid input(s): FREET3 _____________  Dg Chest 2 View  Result Date: 05/10/2016 CLINICAL DATA:  Chest tightness.  Stent placement 05/06/2016 EXAM: CHEST  2 VIEW COMPARISON:  05/05/2016 FINDINGS: The heart size and mediastinal contours are within normal limits. New left and potentially right coronary stent noted. Both lungs are clear. The visualized skeletal  structures are unremarkable. IMPRESSION: No evidence of active disease. Electronically Signed   By: Monte Fantasia M.D.   On: 05/10/2016 01:34     Disposition   Pt is being discharged home today in good condition.  Follow-up Plans & Appointments    Follow-up Information    Kerin Ransom, [place] Follow up on 05/29/2016.   Specialties:  Cardiology, Radiology Why:  8am for your hospital follow up. Contact information: [address] [place] [address] [phone]            Discharge Medications   Current Discharge Medication List    START taking these medications   Details  isosorbide mononitrate (IMDUR) 30 MG 24 hr tablet Take 0.5 tablets (15 mg total) by mouth daily. Qty: 30 tablet, Refills: 11      CONTINUE these medications which have NOT CHANGED   Details  aspirin 81 MG tablet Take 81 mg by  mouth daily.    atorvastatin (LIPITOR) 80 MG tablet Take 1 tablet (80 mg total) by mouth daily. Qty: 90 tablet, Refills: 1    Cinnamon 500 MG capsule Take 500 mg by mouth 2 (two) times daily.     nitroGLYCERIN (NITROSTAT) 0.4 MG SL tablet Place 1 tablet (0.4 mg total) under the tongue every 5 (five) minutes as needed for chest pain (up to 3 doses). Qty: 25 tablet, Refills: 3    ticagrelor (BRILINTA) 90 MG TABS tablet Take 1 tablet (90 mg total) by mouth 2 (two) times daily. Qty: 180 tablet, Refills: 3      STOP taking these medications     metoprolol succinate (TOPROL-XL) 25 MG 24 hr tablet           Outstanding Labs/Studies   None  Duration of Discharge Encounter   Greater than 30 minutes including physician time.  Signed, Reino Bellis NP-C 05/13/2016, 4:50 PM

## 2016-05-13 NOTE — H&P (View-Only) (Signed)
Patient Name: Michael [name] Date of Encounter: 05/13/2016  Hospital Problem List     Active Problems:   Hyperlipidemia   HTN (hypertension)   Unstable angina (HCC)   Chest pain    Subjective   No angina, dyspnea or chest tightness this morning. Resting comfortably.   Inpatient Medications    . aspirin EC  81 mg Oral Daily  . atorvastatin  80 mg Oral q1800  . isosorbide mononitrate  15 mg Oral Daily  . sodium chloride flush  3 mL Intravenous Q12H  . ticagrelor  90 mg Oral BID    Vital Signs    Vitals:   05/12/16 0538 05/12/16 1426 05/12/16 2030 05/13/16 0457  BP: (!) 103/47 (!) 92/59 (!) 114/58 (!) 118/53  Pulse: (!) 52 89 (!) 56 (!) 58  Resp: 16 15 18 16   Temp: 97.9 F (36.6 C) 98.1 F (36.7 C) 97.7 F (36.5 C) 98.5 F (36.9 C)  TempSrc: Oral Oral Oral Oral  SpO2: 99% 94% 100% 100%  Weight: 193 lb 1.6 oz (87.6 kg)   194 lb 9.6 oz (88.3 kg)  Height:        Intake/Output Summary (Last 24 hours) at 05/13/16 0831 Last data filed at 05/13/16 0457  Gross per 24 hour  Intake              240 ml  Output             1025 ml  Net             -785 ml   Filed Weights   05/10/16 1543 05/12/16 0538 05/13/16 0457  Weight: 195 lb 8.8 oz (88.7 kg) 193 lb 1.6 oz (87.6 kg) 194 lb 9.6 oz (88.3 kg)    Physical Exam    General: Pleasant, NAD. Neuro: Alert and oriented X 3. Moves all extremities spontaneously. Psych: Normal affect. HEENT:  Normal  Neck: Supple without bruits or JVD. Lungs:  Resp regular and unlabored, CTA. Heart: RRR no s3, s4, or murmurs. Abdomen: Soft, non-tender, non-distended, BS + x 4.  Extremities: No clubbing, cyanosis or edema. DP/PT/Radials 2+ and equal bilaterally.  Labs    CBC  Recent Labs  05/12/16 0553 05/13/16 0408  WBC 6.7 8.1  HGB 13.1 12.8*  HCT 38.8* 37.5*  MCV 92.8 91.5  PLT 209 [id number]   Basic Metabolic Panel  Recent Labs  05/11/16 0304 05/12/16 0553  NA 138 139  K 4.3 4.2  CL 109 104  CO2 25 29  GLUCOSE 105*  118*  BUN 13 18  CREATININE 1.04 1.27*  CALCIUM 9.0 9.1   Liver Function Tests No results for input(s): AST, ALT, ALKPHOS, BILITOT, PROT, ALBUMIN in the last 72 hours. No results for input(s): LIPASE, AMYLASE in the last 72 hours. Cardiac Enzymes  Recent Labs  05/10/16 2100 05/11/16 0304 05/11/16 0829  TROPONINI 0.45* 0.34* 0.31*   BNP Invalid input(s): POCBNP D-Dimer No results for input(s): DDIMER in the last 72 hours. Hemoglobin A1C No results for input(s): HGBA1C in the last 72 hours. Fasting Lipid Panel  Recent Labs  05/11/16 0305  CHOL 103  HDL 41  LDLCALC 47  TRIG 76  CHOLHDL 2.5   Thyroid Function Tests No results for input(s): TSH, T4TOTAL, T3FREE, THYROIDAB in the last 72 hours.  Invalid input(s): FREET3  Telemetry    SB  ECG    None this morning.  Radiology      Assessment & Plan    60 yo  male with CAD and PCI with DES x2 to distal LAD stenosis with residual 70% D2 and patent stents in the mid and distal RCA on 05/06/2016, HTN and HLD who presented to the ED on 05/10/2016 with complaints of chest pain associated with left arm discomfort and dyspnea.   1. Chest pain: trop were initially mildly elevated on admission, but with downward trend. EKG remains without acute change. He was started on IV nitro and heparin on admission, and has since been able to wean nitro. No reports of chest tightness this morning. Plan was continue with medical therapy over the weekend, and continue with observation. Pain thought to be more likely related to vasospasm.  -- Planned for [facility] cath yesterday, but patient ate dinner so procedure was deferred until today. Remains on heparin. Will follow up cath results. Morning BMET pending, last Cr 1.27  2. HTN: Stable with current medications  3. HLD: Continue statin  4. SB: Noted on telemetry, but remains asymptomatic.   Signed, Reino Bellis NP-C Pager [phone]  As above, patient seen and examined. He has not  had recurrent chest pain or dyspnea. Plan as outlined previously. He had recent intervention and is having recurrent chest pain that he is extremely concerned about. His troponin is minimally elevated. Plan cardiac catheterization today. Continue present medications. Note he is bradycardic intermittently on telemetry but no symptoms. Avoid beta-blockade.  Kirk Ruths, MD

## 2016-05-13 NOTE — Progress Notes (Signed)
Patient Name: Michael [name] Date of Encounter: 05/13/2016  Hospital Problem List     Active Problems:   Hyperlipidemia   HTN (hypertension)   Unstable angina (HCC)   Chest pain    Subjective   No angina, dyspnea or chest tightness this morning. Resting comfortably.   Inpatient Medications    . aspirin EC  81 mg Oral Daily  . atorvastatin  80 mg Oral q1800  . isosorbide mononitrate  15 mg Oral Daily  . sodium chloride flush  3 mL Intravenous Q12H  . ticagrelor  90 mg Oral BID    Vital Signs    Vitals:   05/12/16 0538 05/12/16 1426 05/12/16 2030 05/13/16 0457  BP: (!) 103/47 (!) 92/59 (!) 114/58 (!) 118/53  Pulse: (!) 52 89 (!) 56 (!) 58  Resp: 16 15 18 16   Temp: 97.9 F (36.6 C) 98.1 F (36.7 C) 97.7 F (36.5 C) 98.5 F (36.9 C)  TempSrc: Oral Oral Oral Oral  SpO2: 99% 94% 100% 100%  Weight: 193 lb 1.6 oz (87.6 kg)   194 lb 9.6 oz (88.3 kg)  Height:        Intake/Output Summary (Last 24 hours) at 05/13/16 0831 Last data filed at 05/13/16 0457  Gross per 24 hour  Intake              240 ml  Output             1025 ml  Net             -785 ml   Filed Weights   05/10/16 1543 05/12/16 0538 05/13/16 0457  Weight: 195 lb 8.8 oz (88.7 kg) 193 lb 1.6 oz (87.6 kg) 194 lb 9.6 oz (88.3 kg)    Physical Exam    General: Pleasant, NAD. Neuro: Alert and oriented X 3. Moves all extremities spontaneously. Psych: Normal affect. HEENT:  Normal  Neck: Supple without bruits or JVD. Lungs:  Resp regular and unlabored, CTA. Heart: RRR no s3, s4, or murmurs. Abdomen: Soft, non-tender, non-distended, BS + x 4.  Extremities: No clubbing, cyanosis or edema. DP/PT/Radials 2+ and equal bilaterally.  Labs    CBC  Recent Labs  05/12/16 0553 05/13/16 0408  WBC 6.7 8.1  HGB 13.1 12.8*  HCT 38.8* 37.5*  MCV 92.8 91.5  PLT 209 [id number]   Basic Metabolic Panel  Recent Labs  05/11/16 0304 05/12/16 0553  NA 138 139  K 4.3 4.2  CL 109 104  CO2 25 29  GLUCOSE 105*  118*  BUN 13 18  CREATININE 1.04 1.27*  CALCIUM 9.0 9.1   Liver Function Tests No results for input(s): AST, ALT, ALKPHOS, BILITOT, PROT, ALBUMIN in the last 72 hours. No results for input(s): LIPASE, AMYLASE in the last 72 hours. Cardiac Enzymes  Recent Labs  05/10/16 2100 05/11/16 0304 05/11/16 0829  TROPONINI 0.45* 0.34* 0.31*   BNP Invalid input(s): POCBNP D-Dimer No results for input(s): DDIMER in the last 72 hours. Hemoglobin A1C No results for input(s): HGBA1C in the last 72 hours. Fasting Lipid Panel  Recent Labs  05/11/16 0305  CHOL 103  HDL 41  LDLCALC 47  TRIG 76  CHOLHDL 2.5   Thyroid Function Tests No results for input(s): TSH, T4TOTAL, T3FREE, THYROIDAB in the last 72 hours.  Invalid input(s): FREET3  Telemetry    SB  ECG    None this morning.  Radiology      Assessment & Plan    60 yo  male with CAD and PCI with DES x2 to distal LAD stenosis with residual 70% D2 and patent stents in the mid and distal RCA on 05/06/2016, HTN and HLD who presented to the ED on 05/10/2016 with complaints of chest pain associated with left arm discomfort and dyspnea.   1. Chest pain: trop were initially mildly elevated on admission, but with downward trend. EKG remains without acute change. He was started on IV nitro and heparin on admission, and has since been able to wean nitro. No reports of chest tightness this morning. Plan was continue with medical therapy over the weekend, and continue with observation. Pain thought to be more likely related to vasospasm.  -- Planned for [facility] cath yesterday, but patient ate dinner so procedure was deferred until today. Remains on heparin. Will follow up cath results. Morning BMET pending, last Cr 1.27  2. HTN: Stable with current medications  3. HLD: Continue statin  4. SB: Noted on telemetry, but remains asymptomatic.   Signed, Reino Bellis NP-C Pager [phone]  As above, patient seen and examined. He has not  had recurrent chest pain or dyspnea. Plan as outlined previously. He had recent intervention and is having recurrent chest pain that he is extremely concerned about. His troponin is minimally elevated. Plan cardiac catheterization today. Continue present medications. Note he is bradycardic intermittently on telemetry but no symptoms. Avoid beta-blockade.  Kirk Ruths, MD

## 2016-05-13 NOTE — Progress Notes (Signed)
When patient is sleeping his HR intermittently drops to 38/39bpm. Will continue to monitor. Jessie Foot, RN

## 2016-05-13 NOTE — Interval H&P Note (Signed)
History and Physical Interval Note:  05/13/2016 11:20 AM  Michael [name]  has presented today for surgery, with the diagnosis of relook  The various methods of treatment have been discussed with the patient and family. After consideration of risks, benefits and other options for treatment, the patient has consented to  Procedure(s): Left Heart Cath and Coronary Angiography (N/A) as a surgical intervention .  The patient's history has been reviewed, patient examined, no change in status, stable for surgery.  I have reviewed the patient's chart and labs.  Questions were answered to the patient's satisfaction.   Cath Lab Visit (complete for each Cath Lab visit)  Clinical Evaluation Leading to the Procedure:   ACS: Yes.    Non-ACS:    Anginal Classification: CCS IV  Anti-ischemic medical therapy: Minimal Therapy (1 class of medications)  Non-Invasive Test Results: No non-invasive testing performed  Prior CABG: No previous CABG        [name] 05/13/2016 11:21 AM

## 2016-05-16 ENCOUNTER — Other Ambulatory Visit: Payer: Self-pay | Admitting: *Deleted

## 2016-05-16 MED ORDER — ISOSORBIDE MONONITRATE ER 30 MG PO TB24: 15.0000 mg | ORAL_TABLET | Freq: Every day | ORAL | 3 refills | Status: DC

## 2016-05-21 ENCOUNTER — Other Ambulatory Visit: Payer: Self-pay | Admitting: *Deleted

## 2016-05-21 MED ORDER — ATORVASTATIN CALCIUM 80 MG PO TABS: 80.0000 mg | ORAL_TABLET | Freq: Every day | ORAL | 0 refills | Status: DC

## 2016-05-29 ENCOUNTER — Encounter: Payer: Self-pay | Admitting: Cardiology

## 2016-05-29 ENCOUNTER — Ambulatory Visit (INDEPENDENT_AMBULATORY_CARE_PROVIDER_SITE_OTHER): Admitting: Cardiology

## 2016-05-29 VITALS — BP 138/68 | Ht 66.0 in | Wt 195.8 lb

## 2016-05-29 DIAGNOSIS — I2510 Atherosclerotic heart disease of native coronary artery without angina pectoris: Secondary | ICD-10-CM

## 2016-05-29 DIAGNOSIS — Z9861 Coronary angioplasty status: Secondary | ICD-10-CM

## 2016-05-29 DIAGNOSIS — I200 Unstable angina: Secondary | ICD-10-CM

## 2016-05-29 DIAGNOSIS — E785 Hyperlipidemia, unspecified: Secondary | ICD-10-CM

## 2016-05-29 MED ORDER — ISOSORBIDE MONONITRATE ER 30 MG PO TB24: 30.0000 mg | ORAL_TABLET | Freq: Every day | ORAL | 3 refills | Status: DC

## 2016-05-29 NOTE — Patient Instructions (Addendum)
Medication changes Increase  ISOSORBIDE MONONITRATE to 30 mg one tablet by mouth daily.  No other changes at present.   Your physician wants you to follow-up in: 3 months with Dr Percival Spanish You will receive a reminder letter in the mail two months in advance. If you don't receive a letter, please call our office to schedule the follow-up appointment.   If you need a refill on your cardiac medications before your next appointment, please call your pharmacy.

## 2016-05-29 NOTE — Assessment & Plan Note (Signed)
LDL 47 Aug 2017- on high dose statin Rx

## 2016-05-29 NOTE — Assessment & Plan Note (Signed)
Taxus stent to circumflex in 2006, stent to right coronary artery in 2007, DES to RCA 2015 in [place] Chest pain Sept 2016- Myoview low risk [place] 05/05/16- LAD DES x 2 [place] 05/13/16 with Troponin elelvation- cath showed patent stents- ? spasm   

## 2016-05-29 NOTE — Assessment & Plan Note (Signed)
Pt re admitted 05/13/16 with chest pain, Troponin peak 0.69. Re look cath- all stents patent- ? vasospasm

## 2016-05-29 NOTE — Progress Notes (Signed)
05/29/2016 Michael [name]   12/06/1955  [id number]  Primary Physician Nyoka Cowden, MD Primary Cardiologist: Dr Percival Spanish  HPI:  60 y/o male moved here in 2015 from [place]. He has a history of prior PCI there as listed below. Dr Percival Spanish initially saw him in Dec 2015. In Sept 2016 he was admitted with [place]- Myoview low risk. He then presented 05/05/16 with [place] (Troponin negative). Cath revealed patent stents in the RCA and CFX with disease in his mid LAD. He received two DES. He was discharged then re admitted with similar chest pain and Troponin elevation 05/13/16. Re look cath showed patent LAD stents, CFX stent, and RCA stent. There was a 70% small Dx2 branch narrowing. There was no loss of side branches noted. It was suspected he had spasm. Nitrates were added. He is in the office today for follow up. He has had no further chest pain. 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    Current Outpatient Prescriptions  Medication Sig Dispense Refill  . aspirin 81 MG tablet Take 81 mg by mouth daily.    [name] atorvastatin (LIPITOR) 80 MG tablet Take 1 tablet (80 mg total) by mouth daily. 90 tablet 0  . Cinnamon 500 MG capsule Take 500 mg by mouth 2 (two) times daily.     . nitroGLYCERIN (NITROSTAT) 0.4 MG SL tablet Place 1 tablet (0.4 mg total) under the tongue every 5 (five) minutes as needed for chest pain (up to 3 doses). 25 tablet 3  . ticagrelor (BRILINTA) 90 MG TABS tablet Take 1 tablet (90 mg total) by mouth 2 (two) times daily. 180 tablet 3  . isosorbide mononitrate (IMDUR) 30 MG 24 hr tablet Take 1 tablet (30 mg total) by mouth daily. 90 tablet 3   No current facility-administered medications for this visit.     No Known Allergies  Social History   Social History  . Marital status: Married    Spouse name: N/A  . Number of children: 4  . Years of education: N/A   Occupational History  . Not on file.   Social History Main Topics  . Smoking  status: Never Smoker  . Smokeless tobacco: Never Used  . Alcohol use No  . Drug use: No  . Sexual activity: Yes   Other Topics Concern  . 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  Cardiovascular: negative for chest pain, dyspnea on exertion, edema, orthopnea, palpitations, paroxysmal nocturnal dyspnea or shortness of breath Dermatological: negative for rash Respiratory: negative for cough or wheezing Urologic: negative for hematuria Abdominal: negative for nausea, vomiting, diarrhea, bright red blood per rectum, melena, or hematemesis Neurologic: negative for visual changes, syncope, or dizziness All other systems reviewed and are otherwise negative except as noted above.    Blood pressure 138/68, height 5\' 6"  (1.676 m), weight 195 lb 12.8 oz (88.8 kg).  General appearance: alert, cooperative and no distress Neck: no carotid bruit and no JVD Lungs: clear to auscultation bilaterally Heart: regular rate and rhythm Extremities: Rt radial site without hematoma Skin: Skin color, texture, turgor normal. No rashes or lesions Neurologic: Grossly normal  EKG NSR  ASSESSMENT AND PLAN:   Unstable angina ([place]) Pt re admitted 05/13/16 with chest pain, Troponin peak 0.69. Re look cath- all stents  patent- ? vasospasm  CAD S/P multiple PCIs Taxus stent to circumflex in 2006, stent to right coronary artery in 2007, DES to RCA 2015 in [place] Chest pain Sept 2016- Myoview low risk [place] 05/05/16- LAD DES x 2 [place] 05/13/16 with Troponin elelvation- cath showed patent stents- ? spasm   Hyperlipidemia LDL 47 Aug 2017- on high dose statin Rx   PLAN  Same Rx. He is tolerating Imdur 30 mg (no headache). F/U with Dr Percival Spanish in 3  Months. Consider addition of PCKS9 since he has had progression of his CAD despite full dose statin Rx.    Kerin Ransom PA-C 05/29/2016 8:22 AM

## 2016-08-17 ENCOUNTER — Encounter (HOSPITAL_COMMUNITY): Payer: Self-pay | Admitting: Emergency Medicine

## 2016-08-17 ENCOUNTER — Ambulatory Visit (HOSPITAL_COMMUNITY)
Admission: EM | Admit: 2016-08-17 | Discharge: 2016-08-17 | Disposition: A | Discharge status: Home / Self Care | Attending: Family Medicine | Admitting: Family Medicine

## 2016-08-17 DIAGNOSIS — H6981 Other specified disorders of Eustachian tube, right ear: Secondary | ICD-10-CM | POA: Diagnosis not present

## 2016-08-17 MED ORDER — PREDNISONE 50 MG PO TABS: ORAL_TABLET | ORAL | 0 refills | Status: DC

## 2016-08-17 MED ORDER — IPRATROPIUM BROMIDE 0.06 % NA SOLN: 2.0000 | Freq: Four times a day (QID) | NASAL | 1 refills | Status: DC

## 2016-08-17 NOTE — ED Triage Notes (Signed)
Pt reports cold symptoms for 9 days. PT reports ear pain and fullness for the last 3 days.

## 2016-08-17 NOTE — ED Provider Notes (Signed)
[place]    CSN: [id number] Arrival date & time: 08/17/16  1449     History   Chief Complaint Chief Complaint  Patient presents with  . Ear Fullness    HPI Michael [name] is a 60 y.o. male.   The history is provided by the patient.  Ear Fullness  This is a new problem. The current episode started more than 2 days ago. The problem has not changed since onset.Associated symptoms comments: Right ear popping,.    Past Medical History:  Diagnosis Date  . Anginal pain ([place])   . CAD S/P PCI to Cx & RCA    a. s/p PCI of LCx (2006) and ostium of RCA (2007) as well as DES to RCA in [place] (05/2014)  . HTN (hypertension)   . Hyperlipidemia   . Myocardial infarction   . Obesity     Patient Active Problem List   Diagnosis Date Noted  . Chest pain 05/10/2016  . Hypotension 05/07/2016  . CAD in native artery   . Unstable angina ([place])   . Chest pain with moderate risk of acute coronary syndrome 05/05/2016  . Obesity   . HTN (hypertension)   . CAD S/P multiple PCIs 09/08/2014  . Hyperlipidemia 09/08/2014    Past Surgical History:  Procedure Laterality Date  . CARDIAC CATHETERIZATION N/A 05/06/2016   Procedure: Left Heart Cath and Coronary Angiography;  Surgeon: Nelva Bush, MD;  Location: [facility];  Service: Cardiovascular;  Laterality: N/A;  . CARDIAC CATHETERIZATION N/A 05/06/2016   Procedure: Coronary Stent Intervention;  Surgeon: Nelva Bush, MD;  Location: [facility];  Service: Cardiovascular;  Laterality: N/A;  . CARDIAC CATHETERIZATION N/A 05/13/2016   Procedure: Coronary/Graft Angiography;  Surgeon: Peter M Martinique, MD;  Location: [facility];  Service: Cardiovascular;  Laterality: N/A;  . CORONARY ANGIOPLASTY WITH STENT PLACEMENT  2006   in [place], occluded CFX, s/p Taxus stent  . CORONARY ANGIOPLASTY WITH STENT PLACEMENT  2007   in [place], ostial RCA stent  . CORONARY ANGIOPLASTY WITH STENT PLACEMENT   2015   in [place], RCA stent  . TONSILLECTOMY AND ADENOIDECTOMY          Home Medications    Prior to Admission medications   Medication Sig Start Date End Date Taking? Authorizing Provider  aspirin 81 MG tablet Take 81 mg by mouth daily.    Historical Provider, MD  atorvastatin (LIPITOR) 80 MG tablet Take 1 tablet (80 mg total) by mouth daily. 05/21/16   Marletta Lor, MD  Cinnamon 500 MG capsule Take 500 mg by mouth 2 (two) times daily.     Historical Provider, MD  ipratropium (ATROVENT) 0.06 % nasal spray Place 2 sprays into both nostrils 4 (four) times daily. 08/17/16   Billy Fischer, MD  isosorbide mononitrate (IMDUR) 30 MG 24 hr tablet Take 1 tablet (30 mg total) by mouth daily. 05/29/16 08/27/16  Erlene Quan, PA-C  nitroGLYCERIN (NITROSTAT) 0.4 MG SL tablet Place 1 tablet (0.4 mg total) under the tongue every 5 (five) minutes as needed for chest pain (up to 3 doses). 05/07/16   Dayna N Dunn, PA-C  predniSONE (DELTASONE) 50 MG tablet 1 tab daily for 2 days then 1/2 tab daily for 2 days. 08/17/16   Billy Fischer, MD  ticagrelor (BRILINTA) 90 MG TABS tablet Take 1 tablet (90 mg total) by mouth 2 (two) times daily. 05/07/16   Charlie Pitter, PA-C  Family History Family History  Problem Relation Age of Onset  . Hypertension Father   . Dementia Father     Vascular  . Heart disease Maternal Grandfather 49    Died probably of heart diseasse  . Heart disease Maternal Uncle 34    Died of "heart exploding"    Social History Social History  Substance Use Topics  . Smoking status: Never Smoker  . Smokeless tobacco: Never Used  . Alcohol use No     Allergies   Patient has no known allergies.   Review of Systems Review of Systems  Constitutional: Negative for chills and fever.  HENT: Positive for congestion, ear pain, postnasal drip and rhinorrhea. Negative for ear discharge.   Respiratory: Negative.   Cardiovascular: Negative.   All other systems reviewed and are  negative.    Physical Exam Triage Vital Signs ED Triage Vitals  Enc Vitals Group     BP 08/17/16 1602 118/61     Pulse Rate 08/17/16 1602 77     Resp 08/17/16 1602 16     Temp 08/17/16 1602 98.4 F (36.9 C)     Temp Source 08/17/16 1602 Oral     SpO2 08/17/16 1602 97 %     Weight 08/17/16 1602 191 lb (86.6 kg)     Height 08/17/16 1602 5\' 6"  (1.676 m)     Head Circumference --      Peak Flow --      Pain Score 08/17/16 1603 3     Pain Loc --      Pain Edu? --      Excl. in [place]? --    No data found.   Updated Vital Signs BP 118/61 (BP Location: Left Arm)   Pulse 77   Temp 98.4 F (36.9 C) (Oral)   Resp 16   Ht 5\' 6"  (1.676 m)   Wt 191 lb (86.6 kg)   SpO2 97%   BMI 30.83 kg/m   Visual Acuity Right Eye Distance:   Left Eye Distance:   Bilateral Distance:    Right Eye Near:   Left Eye Near:    Bilateral Near:     Physical Exam  Constitutional: He is oriented to person, place, and time. He appears well-developed and well-nourished. No distress.  HENT:  Head: Normocephalic.  Right Ear: External ear normal.  Left Ear: External ear normal.  Nose: Nose normal.  Mouth/Throat: Oropharynx is clear and moist.  Eyes: Conjunctivae and EOM are normal. Pupils are equal, round, and reactive to light.  Neck: Normal range of motion. Neck supple.  Cardiovascular: Normal rate, regular rhythm, normal heart sounds and intact distal pulses.   Lymphadenopathy:    He has no cervical adenopathy.  Neurological: He is alert and oriented to person, place, and time.  Skin: Skin is warm and dry.  Nursing note and vitals reviewed.    UC Treatments / Results  Labs (all labs ordered are listed, but only abnormal results are displayed) Labs Reviewed - No data to display  EKG  EKG Interpretation None       Radiology No results found.  Procedures Procedures (including critical care time)  Medications Ordered in UC Medications - No data to display   Initial Impression /  Assessment and Plan / UC Course  I have reviewed the triage vital signs and the nursing notes.  Pertinent labs & imaging results that were available during my care of the patient were reviewed by me and considered in my  medical decision making (see chart for details).  Clinical Course       Final Clinical Impressions(s) / UC Diagnoses   Final diagnoses:  Dysfunction of right eustachian tube    New Prescriptions New Prescriptions   IPRATROPIUM (ATROVENT) 0.06 % NASAL SPRAY    Place 2 sprays into both nostrils 4 (four) times daily.   PREDNISONE (DELTASONE) 50 MG TABLET    1 tab daily for 2 days then 1/2 tab daily for 2 days.     Billy Fischer, MD 08/17/16 [phone]

## 2016-08-17 NOTE — Discharge Instructions (Signed)
Drink plenty of fluids as discussed, use medicine as prescribed, and mucinex or delsym for cough. Return or see your doctor if further problems °

## 2016-08-18 ENCOUNTER — Ambulatory Visit (INDEPENDENT_AMBULATORY_CARE_PROVIDER_SITE_OTHER): Admitting: Family Medicine

## 2016-08-18 ENCOUNTER — Encounter: Payer: Self-pay | Admitting: Family Medicine

## 2016-08-18 VITALS — BP 120/82 | HR 72 | Temp 98.2°F | Ht 66.0 in | Wt 195.5 lb

## 2016-08-18 DIAGNOSIS — R040 Epistaxis: Secondary | ICD-10-CM | POA: Diagnosis not present

## 2016-08-18 DIAGNOSIS — J069 Acute upper respiratory infection, unspecified: Secondary | ICD-10-CM

## 2016-08-18 NOTE — Progress Notes (Signed)
Pre visit review using our clinic review tool, if applicable. No additional management support is needed unless otherwise documented below in the visit note. 

## 2016-08-18 NOTE — Progress Notes (Signed)
HPI:  Acute visit for:  Nose bleed: -had upper resp symptoms with clear nasal congestion, PND, cough for 8 days and then R ear pain -went to [facility] last night for the ear pain and was told had ETD and was told to use atrovent -atrovent caused nose bleed this morning that stopped on its own at home - now stopped for > 5 hours -ear feels better today -on asa and  brilinta with cardiologist for CAD s/p PCI -no SOB, fevers, malaise, chills, sinus pain, tooth pain  ROS: See pertinent positives and negatives per HPI.  Past Medical History:  Diagnosis Date  . Anginal pain ([place])   . CAD S/P PCI to Cx & RCA    a. s/p PCI of LCx (2006) and ostium of RCA (2007) as well as DES to RCA in [place] (05/2014)  . HTN (hypertension)   . Hyperlipidemia   . Myocardial infarction   . Obesity     Past Surgical History:  Procedure Laterality Date  . CARDIAC CATHETERIZATION N/A 05/06/2016   Procedure: Left Heart Cath and Coronary Angiography;  Surgeon: Nelva Bush, MD;  Location: [facility];  Service: Cardiovascular;  Laterality: N/A;  . CARDIAC CATHETERIZATION N/A 05/06/2016   Procedure: Coronary Stent Intervention;  Surgeon: Nelva Bush, MD;  Location: [facility];  Service: Cardiovascular;  Laterality: N/A;  . CARDIAC CATHETERIZATION N/A 05/13/2016   Procedure: Coronary/Graft Angiography;  Surgeon: Peter M Martinique, MD;  Location: [facility];  Service: Cardiovascular;  Laterality: N/A;  . CORONARY ANGIOPLASTY WITH STENT PLACEMENT  2006   in [place], occluded CFX, s/p Taxus stent  . CORONARY ANGIOPLASTY WITH STENT PLACEMENT  2007   in [place], ostial RCA stent  . CORONARY ANGIOPLASTY WITH STENT PLACEMENT  2015   in [place], RCA stent  . TONSILLECTOMY AND ADENOIDECTOMY       Family History  Problem Relation Age of Onset  . Hypertension Father   . Dementia Father     Vascular  . Heart disease Maternal Grandfather 55    Died probably of heart diseasse   . Heart disease Maternal Uncle 27    Died of "heart exploding"    Social History   Social History  . Marital status: Married    Spouse name: N/A  . Number of children: 4  . Years of education: N/A   Social History Main Topics  . Smoking status: Never Smoker  . Smokeless tobacco: Never Used  . Alcohol use No  . Drug use: No  . Sexual activity: Yes   Other Topics Concern  . 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  aspirin 81 MG tablet, Take 81 mg by mouth daily., Disp: , Rfl:  .  atorvastatin (LIPITOR) 80 MG tablet, Take 1 tablet (80 mg total) by mouth daily., Disp: 90 tablet, Rfl: 0 .  Cinnamon 500 MG capsule, Take 500 mg by mouth 2 (two) times daily. , Disp: , Rfl:  .  ipratropium (ATROVENT) 0.06 % nasal spray, Place 2 sprays into both nostrils 4 (four) times daily., Disp: 15 mL, Rfl: 1 .  isosorbide mononitrate (IMDUR) 30 MG 24 hr tablet, Take 1 tablet (30 mg total) by mouth daily., Disp: 90 tablet, Rfl: 3 .  nitroGLYCERIN (NITROSTAT) 0.4 MG SL tablet, Place 1 tablet (0.4 mg total) under the tongue every 5 (five) minutes  as needed for chest pain (up to 3 doses)., Disp: 25 tablet, Rfl: 3 .  predniSONE (DELTASONE) 50 MG tablet, 1 tab daily for 2 days then 1/2 tab daily for 2 days., Disp: 3 tablet, Rfl: 0 .  ticagrelor (BRILINTA) 90 MG TABS tablet, Take 1 tablet (90 mg total) by mouth 2 (two) times daily., Disp: 180 tablet, Rfl: 3  EXAM:  Vitals:   08/18/16 1504  BP: 120/82  Pulse: 72  Temp: 98.2 F (36.8 C)    Body mass index is 31.55 kg/m.  GENERAL: vitals reviewed and listed above, alert, oriented, appears well hydrated and in no acute distress  HEENT: atraumatic, conjunttiva clear, no obvious abnormalities on inspection of external nose and ears, normal appearance of ear canals and TMs, clear nasal congestion, small area dried blood L ant nasal septum,  mild post  oropharyngeal erythema with PND, no tonsillar edema or exudate, no sinus TTP  NECK: no obvious masses on inspection  LUNGS: clear to auscultation bilaterally, no wheezes, rales or rhonchi, good air movement  CV: HRRR, no peripheral edema  MS: moves all extremities without noticeable abnormality  PSYCH: pleasant and cooperative, no obvious depression or anxiety  ASSESSMENT AND PLAN:  Discussed the following assessment and plan:  Acute upper respiratory infection  Bleeding from the nose  -likely vuri, now resolving -high risk for nose bleeds given his meds - advised not to use atrovent or nasal steroids -advised humidifier, nasal saline in the future, treatment of nose bleeds -advised emergency precautions if ever has serious nose bleeding or nose bleed that dose not stop -Patient advised to return or notify a doctor immediately if symptoms worsen or persist or new concerns arise.  There are no Patient Instructions on file for this visit.  Colin Benton R., DO

## 2016-09-06 NOTE — Progress Notes (Signed)
HPI The patient presents for evaluation of coronary disease. He moved from [place]. A cath done in [place] before he moved demonstrated a right coronary lesion that was treated with a drug-eluting stent.  In Sept 2016 he was admitted with unstable angina.  A myoview was low risk. He then presented 05/05/16 again with angina. Cath revealed patent stents in the RCA and CFX with disease in his mid LAD. He received two DES. He was discharged then re admitted with similar chest pain and Troponin elevation 05/13/16. Re look cath showed patent LAD stents, CFX stent, and RCA stent. There was a 70% small Dx2 branch narrowing. There was no loss of side branches noted. It was suspected he had spasm. Nitrates were added.  He presents for follow up of his CAD.  He presents for follow up.  He has had a couple of episodes of pain requiring NTG.  Some of these are atypical and sharp.  He has used rare NTG.  He is active at work.    Current Outpatient Prescriptions  Medication Sig Dispense Refill  . aspirin 81 MG tablet Take 81 mg by mouth daily.    [name] atorvastatin (LIPITOR) 80 MG tablet Take 1 tablet (80 mg total) by mouth daily. 90 tablet 0  . Cinnamon 500 MG capsule Take 500 mg by mouth 2 (two) times daily.     [name] ipratropium (ATROVENT) 0.06 % nasal spray Place 2 sprays into both nostrils 4 (four) times daily. 15 mL 1  . isosorbide mononitrate (IMDUR) 60 MG 24 hr tablet Take 1 tablet (60 mg total) by mouth daily. 90 tablet 3  . nitroGLYCERIN (NITROSTAT) 0.4 MG SL tablet Place 1 tablet (0.4 mg total) under the tongue every 5 (five) minutes as needed for chest pain (up to 3 doses). 25 tablet 3  . ticagrelor (BRILINTA) 90 MG TABS tablet Take 1 tablet (90 mg total) by mouth 2 (two) times daily. 180 tablet 3   No current facility-administered medications for this visit.     Past Medical History:  Diagnosis Date  . Anginal pain ([place])   . CAD S/P PCI to Cx & RCA    a. s/p PCI of LCx (2006) and ostium of RCA (2007) as  well as DES to RCA in [place] (05/2014)  . HTN (hypertension)   . Hyperlipidemia   . Myocardial infarction   . Obesity     Past Surgical History:  Procedure Laterality Date  . CARDIAC CATHETERIZATION N/A 05/06/2016   Procedure: Left Heart Cath and Coronary Angiography;  Surgeon: Nelva Bush, MD;  Location: [facility];  Service: Cardiovascular;  Laterality: N/A;  . CARDIAC CATHETERIZATION N/A 05/06/2016   Procedure: Coronary Stent Intervention;  Surgeon: Nelva Bush, MD;  Location: [facility];  Service: Cardiovascular;  Laterality: N/A;  . CARDIAC CATHETERIZATION N/A 05/13/2016   Procedure: Coronary/Graft Angiography;  Surgeon: Peter M Martinique, MD;  Location: [facility];  Service: Cardiovascular;  Laterality: N/A;  . CORONARY ANGIOPLASTY WITH STENT PLACEMENT  2006   in [place], occluded CFX, s/p Taxus stent  . CORONARY ANGIOPLASTY WITH STENT PLACEMENT  2007   in [place], ostial RCA stent  . CORONARY ANGIOPLASTY WITH STENT PLACEMENT  2015   in [place], RCA stent  . TONSILLECTOMY AND ADENOIDECTOMY       ROS:    As stated in the HPI and negative for all other systems.  PHYSICAL EXAM BP 123/75   Pulse 97   Ht 5'  6" (1.676 m)   Wt 190 lb 12.8 oz (86.5 kg)   BMI 30.80 kg/m   GENERAL:  Well appearing NECK:  No jugular venous distention, waveform within normal limits, carotid upstroke brisk and symmetric, no bruits, no thyromegaly LUNGS:  Clear to auscultation bilaterally CHEST:  Unremarkable HEART:  PMI not displaced or sustained,S1 and S2 within normal limits, no S3, no S4, no clicks, no rubs, no murmurs ABD:  Flat, positive bowel sounds normal in frequency in pitch, no bruits, no rebound, no guarding, no midline pulsatile mass, no hepatomegaly, no splenomegaly EXT:  2 plus pulses throughout, no edema, no cyanosis no clubbing SKIN:  No rashes no nodules   Lab Results  Component Value Date   CHOL 103 05/11/2016   TRIG 76 05/11/2016     HDL 41 05/11/2016   LDLCALC 47 05/11/2016     ASSESSMENT AND PLAN   CAD:   He does have some angina and has used about 4 NTG in the past 4 months.  It is better on Imdur and I will increase this to 60 mg daily.    DYSLIPIDEMIA:    His LDL is as above.  He will continue with meds as listed.   EXERCISE:   We talked again about walking.

## 2016-09-08 ENCOUNTER — Ambulatory Visit (INDEPENDENT_AMBULATORY_CARE_PROVIDER_SITE_OTHER): Admitting: Cardiology

## 2016-09-08 ENCOUNTER — Encounter: Payer: Self-pay | Admitting: Cardiology

## 2016-09-08 VITALS — BP 123/75 | HR 97 | Ht 66.0 in | Wt 190.8 lb

## 2016-09-08 DIAGNOSIS — I2510 Atherosclerotic heart disease of native coronary artery without angina pectoris: Secondary | ICD-10-CM | POA: Diagnosis not present

## 2016-09-08 DIAGNOSIS — I25118 Atherosclerotic heart disease of native coronary artery with other forms of angina pectoris: Secondary | ICD-10-CM | POA: Diagnosis not present

## 2016-09-08 DIAGNOSIS — Z9861 Coronary angioplasty status: Secondary | ICD-10-CM | POA: Diagnosis not present

## 2016-09-08 MED ORDER — ISOSORBIDE MONONITRATE ER 60 MG PO TB24: 60.0000 mg | ORAL_TABLET | Freq: Every day | ORAL | 3 refills | Status: DC

## 2016-09-08 NOTE — Patient Instructions (Signed)
Medication Instructions:  INCREASE- Isosorbide 60 mg daily   Labwork: None Ordered  Testing/Procedures: None Ordered  Follow-Up: Your physician recommends that you schedule a follow-up appointment in: 3 Months with Kerin Ransom    Any Other Special Instructions Will Be Listed Below (If Applicable).           HAPPY HOLIDAY  If you need a refill on your cardiac medications before your next appointment, please call your pharmacy.

## 2016-10-30 ENCOUNTER — Encounter (HOSPITAL_COMMUNITY): Payer: Self-pay | Admitting: Internal Medicine

## 2016-10-30 ENCOUNTER — Telehealth (HOSPITAL_COMMUNITY): Payer: Self-pay | Admitting: Internal Medicine

## 2016-10-30 NOTE — Progress Notes (Signed)
Mailed ltr with Cardiac Rehab Program along with My Chart msg.... KJ

## 2016-11-03 ENCOUNTER — Ambulatory Visit (INDEPENDENT_AMBULATORY_CARE_PROVIDER_SITE_OTHER): Admitting: Internal Medicine

## 2016-11-03 ENCOUNTER — Encounter: Payer: Self-pay | Admitting: Internal Medicine

## 2016-11-03 VITALS — BP 118/68 | HR 87 | Temp 98.5°F | Ht 66.0 in | Wt 194.0 lb

## 2016-11-03 DIAGNOSIS — I10 Essential (primary) hypertension: Secondary | ICD-10-CM | POA: Diagnosis not present

## 2016-11-03 DIAGNOSIS — I2510 Atherosclerotic heart disease of native coronary artery without angina pectoris: Secondary | ICD-10-CM

## 2016-11-03 DIAGNOSIS — M67441 Ganglion, right hand: Secondary | ICD-10-CM | POA: Diagnosis not present

## 2016-11-03 DIAGNOSIS — R7302 Impaired glucose tolerance (oral): Secondary | ICD-10-CM | POA: Diagnosis not present

## 2016-11-03 DIAGNOSIS — Z9861 Coronary angioplasty status: Secondary | ICD-10-CM

## 2016-11-03 NOTE — Progress Notes (Signed)
Pre visit review using our clinic review tool, if applicable. No additional management support is needed unless otherwise documented below in the visit note. 

## 2016-11-03 NOTE — Progress Notes (Signed)
Subjective:    Patient ID: Michael [name], male    DOB: Dec 02, 1955, 61 y.o.   MRN: [id number]  HPI  61 year old patient who is seen today with 2 concerns. He has a history of essential hypertension and coronary artery disease which has been stable  For the past several months, he has noted a nodule involving the right third finger between the PIP and MCP joints on the palmar aspect of the finger. There is no pain and does not limit his activity.  Also, he has noted some occasional popping involving the left shoulder area  Past Medical History:  Diagnosis Date  . Anginal pain ([place])   . CAD S/P PCI to Cx & RCA    a. s/p PCI of LCx (2006) and ostium of RCA (2007) as well as DES to RCA in [place] (05/2014)  . HTN (hypertension)   . Hyperlipidemia   . Myocardial infarction   . Obesity      Social History   Social History  . Marital status: Married    Spouse name: N/A  . Number of children: 4  . Years of education: N/A   Occupational History  . Not on file.   Social History Main Topics  . Smoking status: Never Smoker  . Smokeless tobacco: Never Used  . Alcohol use No  . Drug use: No  . Sexual activity: Yes   Other Topics Concern  . 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 CARDIAC CATHETERIZATION N/A 05/06/2016   Procedure: Left Heart Cath and Coronary Angiography;  Surgeon: Nelva Bush, MD;  Location: [facility];  Service: Cardiovascular;  Laterality: N/A;  . CARDIAC CATHETERIZATION N/A 05/06/2016   Procedure: Coronary Stent Intervention;  Surgeon: Nelva Bush, MD;  Location: [facility];  Service: Cardiovascular;  Laterality: N/A;  . CARDIAC CATHETERIZATION N/A 05/13/2016   Procedure: Coronary/Graft Angiography;  Surgeon: Kinslea Frances M Martinique, MD;  Location: [facility];  Service: Cardiovascular;  Laterality: N/A;  .  CORONARY ANGIOPLASTY WITH STENT PLACEMENT  2006   in [place], occluded CFX, s/p Taxus stent  . CORONARY ANGIOPLASTY WITH STENT PLACEMENT  2007   in [place], ostial RCA stent  . CORONARY ANGIOPLASTY WITH STENT PLACEMENT  2015   in [place], RCA stent  . TONSILLECTOMY AND ADENOIDECTOMY       Family History  Problem Relation Age of Onset  . Hypertension Father   . Dementia Father     Vascular  . Heart disease Maternal Grandfather 4    Died probably of heart diseasse  . Heart disease Maternal Uncle 54    Died of "heart exploding"    No Known Allergies  Current Outpatient Prescriptions on File Prior to Visit  Medication Sig Dispense Refill  . aspirin 81 MG tablet Take 81 mg by mouth daily.    [name] atorvastatin (LIPITOR) 80 MG tablet Take 1 tablet (80 mg total) by mouth daily. 90 tablet 0  . Cinnamon 500 MG capsule Take 500 mg by mouth 2 (two) times daily.     [name] ipratropium (ATROVENT) 0.06 % nasal spray Place 2 sprays into both nostrils 4 (four) times daily. 15 mL 1  . isosorbide mononitrate (IMDUR) 60 MG 24 hr tablet Take 1 tablet (60 mg total) by mouth daily. 90 tablet 3  . nitroGLYCERIN (NITROSTAT) 0.4 MG  SL tablet Place 1 tablet (0.4 mg total) under the tongue every 5 (five) minutes as needed for chest pain (up to 3 doses). 25 tablet 3  . ticagrelor (BRILINTA) 90 MG TABS tablet Take 1 tablet (90 mg total) by mouth 2 (two) times daily. 180 tablet 3   No current facility-administered medications on file prior to visit.     BP 118/68 (BP Location: Right Arm, Patient Position: Sitting, Cuff Size: Normal)   Pulse 87   Temp 98.5 F (36.9 C) (Oral)   Ht 5\' 6"  (1.676 m)   Wt 194 lb (88 kg)   SpO2 98%   BMI 31.31 kg/m     Review of Systems  Constitutional: Negative for appetite change, chills, fatigue and fever.  HENT: Negative for congestion, dental problem, ear pain, hearing loss, sore throat, tinnitus, trouble swallowing and voice change.   Eyes: Negative for  pain, discharge and visual disturbance.  Respiratory: Negative for cough, chest tightness, wheezing and stridor.   Cardiovascular: Negative for chest pain, palpitations and leg swelling.  Gastrointestinal: Negative for abdominal distention, abdominal pain, blood in stool, constipation, diarrhea, nausea and vomiting.  Genitourinary: Negative for difficulty urinating, discharge, flank pain, genital sores, hematuria and urgency.  Musculoskeletal: Negative for arthralgias, back pain, gait problem, joint swelling, myalgias and neck stiffness.  Skin: Negative for rash.  Neurological: Negative for dizziness, syncope, speech difficulty, weakness, numbness and headaches.  Hematological: Negative for adenopathy. Does not bruise/bleed easily.  Psychiatric/Behavioral: Negative for behavioral problems and dysphoric mood. The patient is not nervous/anxious.        Objective:   Physical Exam  Constitutional: He appears well-developed and well-nourished. No distress.  Blood pressure low normal  Musculoskeletal:  A small 5-6 mm nodule noted involving the palmar aspect of the right third finger between the MCP and PIP joint  Full range of motion of the left shoulder. Occasional popping noted with abduction of the shoulder          Assessment & Plan:   Probable small ganglion right third finger.  Options discussed.  Will observe at this time Pop in left shoulder without pain.  We'll also observe.  Orthopedic referral if either of these problems become more symptomatic Coronary artery disease.  Continue high-dose statin therapy and antiplatelet regimen  CPX 6 months  Jaclin Finks Pilar Plate

## 2016-11-03 NOTE — Patient Instructions (Addendum)
Limit your sodium (Salt) intake    It is important that you exercise regularly, at least 20 minutes 3 to 4 times per week.  If you develop chest pain or shortness of breath seek  medical attention.  Call or return to clinic prn if these symptoms worsen or fail to improve as anticipated.  

## 2016-11-11 ENCOUNTER — Other Ambulatory Visit: Payer: Self-pay | Admitting: Internal Medicine

## 2017-02-17 ENCOUNTER — Other Ambulatory Visit: Payer: Self-pay | Admitting: Internal Medicine

## 2017-04-12 NOTE — Progress Notes (Signed)
[name] [address] [place], [place] [address] Phone: [phone] Subjective:    I'm seeing this patient by the request  of:  Marletta Lor, MD   CC: back pain   FOY:DXAJOINOMV  Michael [name] is a 61 y.o. male coming in with complaint of Back pain. Been going on for 1-2 years. Does not remember any true injury. Noticing he is becoming more stiff. Patient states when he lays on his back seems to be more severe. Denies any radiation down the legs. Denies any weakness. Is stiff enough though that he does stop certain activities because of it. Rates the severity pain is 8 out of 10. Does not respond over-the-counter medications.      Past Medical History:  Diagnosis Date  . Anginal pain ([place])   . CAD S/P PCI to Cx & RCA    a. s/p PCI of LCx (2006) and ostium of RCA (2007) as well as DES to RCA in [place] (05/2014)  . HTN (hypertension)   . Hyperlipidemia   . Myocardial infarction ([place])   . Obesity    Past Surgical History:  Procedure Laterality Date  . CARDIAC CATHETERIZATION N/A 05/06/2016   Procedure: Left Heart Cath and Coronary Angiography;  Surgeon: Nelva Bush, MD;  Location: [facility];  Service: Cardiovascular;  Laterality: N/A;  . CARDIAC CATHETERIZATION N/A 05/06/2016   Procedure: Coronary Stent Intervention;  Surgeon: Nelva Bush, MD;  Location: [facility];  Service: Cardiovascular;  Laterality: N/A;  . CARDIAC CATHETERIZATION N/A 05/13/2016   Procedure: Coronary/Graft Angiography;  Surgeon: Peter M Martinique, MD;  Location: [facility];  Service: Cardiovascular;  Laterality: N/A;  . CORONARY ANGIOPLASTY WITH STENT PLACEMENT  2006   in [place], occluded CFX, s/p Taxus stent  . CORONARY ANGIOPLASTY WITH STENT PLACEMENT  2007   in [place], ostial RCA stent  . CORONARY ANGIOPLASTY WITH STENT PLACEMENT  2015   in [place], RCA stent  . TONSILLECTOMY AND ADENOIDECTOMY      Social  History   Social History  . Marital status: Married    Spouse name: N/A  . Number of children: 4  . Years of education: N/A   Social History Main Topics  . Smoking status: Never Smoker  . Smokeless tobacco: Never Used  . Alcohol use No  . Drug use: No  . Sexual activity: Yes   Other Topics Concern  . 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 Hypertension Father   . Dementia Father        Vascular  . Heart disease Maternal Grandfather 18       Died probably of heart diseasse  . Heart disease Maternal Uncle 71       Died of "heart exploding"     Past medical history, social, surgical and family history all reviewed in electronic medical record.  No pertanent information unless stated regarding to the chief complaint.   Review of Systems:Review of systems updated and as accurate as of 04/13/17  No headache, visual changes, nausea, vomiting, diarrhea, constipation, dizziness, abdominal pain, skin rash, fevers, chills, night sweats, weight loss, swollen lymph nodes, body aches, joint swelling,  chest pain, shortness of breath, mood changes. Positive muscle aches  Objective  Blood pressure 98/64, pulse 60, height 5\' 6"  (1.676 m),  weight 192 lb (87.1 kg), SpO2 96 %. Systems examined below as of 04/13/17   General: No apparent distress alert and oriented x3 mood and affect normal, dressed appropriately.  HEENT: Pupils equal, extraocular movements intact  Respiratory: Patient's speak in full sentences and does not appear short of breath  Cardiovascular: No lower extremity edema, non tender, no erythema  Skin: Warm dry intact with no signs of infection or rash on extremities or on axial skeleton. Patient does have vitiligo noted. Abdomen: Soft nontender  Neuro: Cranial nerves II through XII are intact, neurovascularly intact in all extremities with 2+  DTRs and 2+ pulses.  Lymph: No lymphadenopathy of posterior or anterior cervical chain or axillae bilaterally.  Gait normal with good balance and coordination.  MSK:  Non tender with full range of motion and good stability and symmetric strength and tone of shoulders, elbows, wrist, hip, knee and ankles bilaterally.  Back Exam:  Inspection: Significant loss of lordosis. Motion: Flexion 25 deg, Extension 15 deg, Side Bending to 20deg bilaterally,  Rotation to 20 deg bilaterally  SLR laying: Negative  XSLR laying: Negative  Palpable tenderness: Tender to palpation in the paraspinal musculature lumbar spine. Mostly over the thoracolumbar juncture FABER: Tightness bilaterally. Mild loss of internal range of motion of the hips bilaterally.. Sensory change: Gross sensation intact to all lumbar and sacral dermatomes.  Reflexes: 2+ at both patellar tendons, 2+ at achilles tendons, Babinski's downgoing.  Strength at foot  4 out of 5 but symmetric  Osteopathic findings T3 extended rotated and side bent right  T7 extended rotated and side bent left L3 flexed rotated and side bent right Sacrum left on left      Impression and Recommendations:     This case required medical decision making of moderate complexity.      Note: This dictation was prepared with Dragon dictation along with smaller phrase technology. Any transcriptional errors that result from this process are unintentional.

## 2017-04-13 ENCOUNTER — Ambulatory Visit (INDEPENDENT_AMBULATORY_CARE_PROVIDER_SITE_OTHER): Admitting: Family Medicine

## 2017-04-13 ENCOUNTER — Encounter: Payer: Self-pay | Admitting: Family Medicine

## 2017-04-13 ENCOUNTER — Ambulatory Visit (INDEPENDENT_AMBULATORY_CARE_PROVIDER_SITE_OTHER)
Admission: RE | Admit: 2017-04-13 | Discharge: 2017-04-13 | Disposition: A | Discharge status: Home / Self Care | Source: Ambulatory Visit | Attending: Family Medicine | Admitting: Family Medicine

## 2017-04-13 DIAGNOSIS — M999 Biomechanical lesion, unspecified: Secondary | ICD-10-CM

## 2017-04-13 DIAGNOSIS — M549 Dorsalgia, unspecified: Secondary | ICD-10-CM

## 2017-04-13 DIAGNOSIS — G8929 Other chronic pain: Secondary | ICD-10-CM

## 2017-04-13 MED ORDER — VITAMIN D (ERGOCALCIFEROL) 1.25 MG (50000 UNIT) PO CAPS: 50000.0000 [IU] | ORAL_CAPSULE | ORAL | 0 refills | Status: DC

## 2017-04-13 NOTE — Assessment & Plan Note (Signed)
Patient is back pain and seems to be sustaining. Patient does have significant tightness of the thoracolumbar juncture. Patient does have some fairly good range of motion of the hands but does have some limited in internal range of motion. Some weakness on the proximal aspect not as well. Patient may have other possibilities going on. We discussed icing regimen, home exercises, icing regimen. Topical anti-inflammatories given and we'll avoid oral anti-inflammatories. Attempted osteopathic manipulation. Follow-up again in 4 weeks.

## 2017-04-13 NOTE — Assessment & Plan Note (Signed)
Decision today to treat with OMT was based on Physical Exam  After verbal consent patient was treated with HVLA, ME, FPR techniques in cervical, thoracic, lumbar and sacral areas  Patient tolerated the procedure well with improvement in symptoms  Patient given exercises, stretches and lifestyle modifications  See medications in patient instructions if given  Patient will follow up in 3-4 weeks  

## 2017-04-13 NOTE — Patient Instructions (Addendum)
Good to see you.  Ice 20 minutes 2 times daily. Usually after activity and before bed. Exercises 3 times a week.  pennsaid pinkie amount topically 2 times daily as needed.   Xray downstairs downstairs.  Once weekly vitamin D for 12 weeks  Over the counter Turmeric 500mg  1-2 times a day  Tart cherry extract any dose at night DHEA 50 mg daily for 4 weeks.  See me again in 4 weeks

## 2017-04-13 NOTE — Addendum Note (Signed)
Addended by: [name] on: 04/13/2017 01:23 PM   Modules accepted: Orders

## 2017-04-14 ENCOUNTER — Ambulatory Visit (INDEPENDENT_AMBULATORY_CARE_PROVIDER_SITE_OTHER): Admitting: Internal Medicine

## 2017-04-14 ENCOUNTER — Encounter: Payer: Self-pay | Admitting: Internal Medicine

## 2017-04-14 DIAGNOSIS — R7302 Impaired glucose tolerance (oral): Secondary | ICD-10-CM

## 2017-04-14 DIAGNOSIS — I10 Essential (primary) hypertension: Secondary | ICD-10-CM | POA: Diagnosis not present

## 2017-04-14 LAB — POCT GLYCOSYLATED HEMOGLOBIN (HGB A1C): Hemoglobin A1C: 5.8

## 2017-04-14 NOTE — Progress Notes (Signed)
Subjective:    Patient ID: Michael [name], male    DOB: 07-Mar-1956, 61 y.o.   MRN: [id number]  HPI  61 year old patient who has a history of coronary artery disease.  He sustained a tick bite to the left posterior thigh.  4 weeks ago.  Will recently developed a nonspecific rash involving the left upper medial thigh that has resolved.  He denies any fever, myalgias, headache, or other constitutional complaints. He does have a history of impaired glucose tolerance  Past Medical History:  Diagnosis Date  . Anginal pain ([place])   . CAD S/P PCI to Cx & RCA    a. s/p PCI of LCx (2006) and ostium of RCA (2007) as well as DES to RCA in [place] (05/2014)  . HTN (hypertension)   . Hyperlipidemia   . Myocardial infarction ([place])   . Obesity      Social History   Social History  . Marital status: Married    Spouse name: N/A  . Number of children: 4  . Years of education: N/A   Occupational History  . Not on file.   Social History Main Topics  . Smoking status: Never Smoker  . Smokeless tobacco: Never Used  . Alcohol use No  . Drug use: No  . Sexual activity: Yes   Other Topics Concern  . 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 CARDIAC CATHETERIZATION N/A 05/06/2016   Procedure: Left Heart Cath and Coronary Angiography;  Surgeon: Nelva Bush, MD;  Location: [facility];  Service: Cardiovascular;  Laterality: N/A;  . CARDIAC CATHETERIZATION N/A 05/06/2016   Procedure: Coronary Stent Intervention;  Surgeon: Nelva Bush, MD;  Location: [facility];  Service: Cardiovascular;  Laterality: N/A;  . CARDIAC CATHETERIZATION N/A 05/13/2016   Procedure: Coronary/Graft Angiography;  Surgeon: Samari Bittinger M Martinique, MD;  Location: [facility];  Service: Cardiovascular;  Laterality: N/A;  . CORONARY ANGIOPLASTY WITH STENT PLACEMENT  2006   in  [place], occluded CFX, s/p Taxus stent  . CORONARY ANGIOPLASTY WITH STENT PLACEMENT  2007   in [place], ostial RCA stent  . CORONARY ANGIOPLASTY WITH STENT PLACEMENT  2015   in [place], RCA stent  . TONSILLECTOMY AND ADENOIDECTOMY       Family History  Problem Relation Age of Onset  . Hypertension Father   . Dementia Father        Vascular  . Heart disease Maternal Grandfather 60       Died probably of heart diseasse  . Heart disease Maternal Uncle 93       Died of "heart exploding"    No Known Allergies  Current Outpatient Prescriptions on File Prior to Visit  Medication Sig Dispense Refill  . aspirin 81 MG tablet Take 81 mg by mouth daily.    [name] atorvastatin (LIPITOR) 80 MG tablet TAKE 1 TABLET DAILY (PLEASE CALL OFFICE AND SCHEDULE APPOINTMENT FOR ANNUAL PHYSICAL) 90 tablet 0  . Cinnamon 500 MG capsule Take 500 mg by mouth 2 (two) times daily.     . isosorbide mononitrate (IMDUR) 60 MG 24 hr tablet Take 1 tablet (60 mg total) by mouth daily. 90 tablet 3  . nitroGLYCERIN (NITROSTAT) 0.4 MG SL tablet Place 1 tablet (0.4 mg total) under the tongue every 5 (five) minutes as needed for chest pain (up to  3 doses). 25 tablet 3  . ticagrelor (BRILINTA) 90 MG TABS tablet Take 1 tablet (90 mg total) by mouth 2 (two) times daily. 180 tablet 3  . Vitamin D, Ergocalciferol, (DRISDOL) 50000 units CAPS capsule Take 1 capsule (50,000 Units total) by mouth every 7 (seven) days. 12 capsule 0  . ipratropium (ATROVENT) 0.06 % nasal spray Place 2 sprays into both nostrils 4 (four) times daily. (Patient not taking: Reported on 04/14/2017) 15 mL 1   No current facility-administered medications on file prior to visit.     BP 108/70 (BP Location: Left Arm, Patient Position: Sitting, Cuff Size: Normal)   Pulse 62   Temp 97.7 F (36.5 C) (Oral)   Wt 191 lb 3.2 oz (86.7 kg)   SpO2 98%   BMI 30.86 kg/m     Review of Systems  Constitutional: Negative for appetite change, chills,  fatigue and fever.  HENT: Negative for congestion, dental problem, ear pain, hearing loss, sore throat, tinnitus, trouble swallowing and voice change.   Eyes: Negative for pain, discharge and visual disturbance.  Respiratory: Negative for cough, chest tightness, wheezing and stridor.   Cardiovascular: Negative for chest pain, palpitations and leg swelling.  Gastrointestinal: Negative for abdominal distention, abdominal pain, blood in stool, constipation, diarrhea, nausea and vomiting.  Genitourinary: Negative for difficulty urinating, discharge, flank pain, genital sores, hematuria and urgency.  Musculoskeletal: Negative for arthralgias, back pain, gait problem, joint swelling, myalgias and neck stiffness.  Skin: Positive for rash.  Neurological: Negative for dizziness, syncope, speech difficulty, weakness, numbness and headaches.  Hematological: Negative for adenopathy. Does not bruise/bleed easily.  Psychiatric/Behavioral: Negative for behavioral problems and dysphoric mood. The patient is not nervous/anxious.        Objective:   Physical Exam  Constitutional: He is oriented to person, place, and time. He appears well-developed.  HENT:  Head: Normocephalic.  Right Ear: External ear normal.  Left Ear: External ear normal.  Eyes: Conjunctivae and EOM are normal.  Neck: Normal range of motion.  Cardiovascular: Normal rate and normal heart sounds.   Pulmonary/Chest: Breath sounds normal.  Abdominal: Bowel sounds are normal.  Musculoskeletal: Normal range of motion. He exhibits no edema or tenderness.  Neurological: He is alert and oriented to person, place, and time.  Skin:  No lesion involving the left posterior thigh Some resolving nonspecific erythema involving the left upper medial thigh  Psychiatric: He has a normal mood and affect. His behavior is normal.          Assessment & Plan:   History of tick bite.  Will observe Coronary artery disease Impaired glucose  tolerance.  Hemoglobin A1c checked.  This was 5.7  Follow cardiology Patient will report any fever or other constitutional complaints CPX 6 months  Nazair Fortenberry Pilar Plate

## 2017-04-14 NOTE — Patient Instructions (Signed)
  Report any fever or flulike symptoms  Cardiology follow-up as scheduled

## 2017-04-23 ENCOUNTER — Emergency Department (HOSPITAL_COMMUNITY)

## 2017-04-23 ENCOUNTER — Encounter (HOSPITAL_COMMUNITY): Payer: Self-pay

## 2017-04-23 ENCOUNTER — Emergency Department (HOSPITAL_COMMUNITY)
Admission: EM | Admit: 2017-04-23 | Discharge: 2017-04-23 | Disposition: A | Discharge status: Home / Self Care | Attending: Emergency Medicine | Admitting: Emergency Medicine

## 2017-04-23 ENCOUNTER — Other Ambulatory Visit: Payer: Self-pay

## 2017-04-23 DIAGNOSIS — R079 Chest pain, unspecified: Secondary | ICD-10-CM

## 2017-04-23 DIAGNOSIS — I2510 Atherosclerotic heart disease of native coronary artery without angina pectoris: Secondary | ICD-10-CM | POA: Insufficient documentation

## 2017-04-23 DIAGNOSIS — I10 Essential (primary) hypertension: Secondary | ICD-10-CM | POA: Diagnosis not present

## 2017-04-23 DIAGNOSIS — Z7982 Long term (current) use of aspirin: Secondary | ICD-10-CM | POA: Diagnosis not present

## 2017-04-23 DIAGNOSIS — Z79899 Other long term (current) drug therapy: Secondary | ICD-10-CM | POA: Diagnosis not present

## 2017-04-23 LAB — BASIC METABOLIC PANEL
Anion gap: 6 (ref 5–15)
BUN: 17 mg/dL (ref 6–20)
CO2: 28 mmol/L (ref 22–32)
Calcium: 9.4 mg/dL (ref 8.9–10.3)
Chloride: 105 mmol/L (ref 101–111)
Creatinine, Ser: 1.05 mg/dL (ref 0.61–1.24)
GFR calc Af Amer: >60 mL/min (ref >60)
GFR calc non Af Amer: >60 mL/min (ref >60)
Glucose, Bld: 141 mg/dL — ABNORMAL HIGH (ref 65–99)
Potassium: 3.5 mmol/L (ref 3.5–5.1)
Sodium: 139 mmol/L (ref 135–145)

## 2017-04-23 LAB — I-STAT TROPONIN, ED
Troponin i, poc: 0 ng/mL (ref 0.00–0.08)
Troponin i, poc: 0 ng/mL (ref 0.00–0.08)

## 2017-04-23 LAB — CBC
HCT: 40.7 % (ref 39.0–52.0)
Hemoglobin: 13.9 g/dL (ref 13.0–17.0)
MCH: 31.1 pg (ref 26.0–34.0)
MCHC: 34.2 g/dL (ref 30.0–36.0)
MCV: 91.1 fL (ref 78.0–100.0)
Platelets: 212 10*3/uL (ref 150–400)
RBC: 4.47 MIL/uL (ref 4.22–5.81)
RDW: 13.2 % (ref 11.5–15.5)
WBC: 6.6 10*3/uL (ref 4.0–10.5)

## 2017-04-23 NOTE — ED Triage Notes (Signed)
Per Pt, Pt is coming from home with complaints of left chest tightness that started at 0930 this morning. Pt reports taking nitro that decreases the pain. Pt ate lunch and the pain came back. Pt reports taking a second nitro and the pain did not fully decrease. Pt has left-sided chest pain that radiates down his left arm with some left arm numbness. SOB, dizziness.

## 2017-04-23 NOTE — Discharge Instructions (Signed)
Follow-up with your primary care doctor next 24-48 hours for further evaluation.  Follow-up with your cardiologist in the next 24 days for further evaluation.  Return the emergency Department for any worsening pain, difficult breathing, nausea/vomiting, sweating, any other worsening or concerning symptoms.

## 2017-04-23 NOTE — ED Provider Notes (Signed)
[facility] DEPT Provider Note   CSN: [id number] Arrival date & time: 04/23/17  1418     History   Chief Complaint Chief Complaint  Patient presents with  . Chest Pain    HPI Michael [name] is a 61 y.o. male with past medical history of angina, hypertension, hyperlipidemia, MI with catheterization who presents with left-sided chest pain that began this morning at 9:30. Patient states that he was at work when the symptoms started inserted noticing a "tightness sensation" in the left chest that extended into the left arm. Patient states that he took his nitroglycerin dose which made the pain resolved. Patient reports that at approximately 12:30 PM he started experiencing pain again. Patient states that he took his dose of nitroglycerin but noted that the nitroglycerin took approximate an hour to help decrease the pain. At that time he noted that the pain resolved slightly decreased prompting emergency department visit. Patient states that the pain was not worsened with exertion or deep inspiration. He states that he "feels restricted when trying to breathe" but denies any pain. On ED from arrival he states that he has no pain. During this episode of chest pain he did not have any shortness breath, diaphoresis, nausea/vomiting. Patient does have a history of angina and reports that he feels like over the past couple weeks his visit angina have become more frequent but they're relieved with nitroglycerin. Patient denies any recent heavy lifting or injury. Patient denies any recent sickness. He does note that he recently been very stressed at work. Patient denies any fever, nausea/vomiting, diaphoresis, abdominal pain, leg swelling.  The history is provided by the patient.    Past Medical History:  Diagnosis Date  . Anginal pain ([place])   . CAD S/P PCI to Cx & RCA    a. s/p PCI of LCx (2006) and ostium of RCA (2007) as well as DES to RCA in [place] (05/2014)  . HTN (hypertension)   .  Hyperlipidemia   . Myocardial infarction ([place])   . Obesity     Patient Active Problem List   Diagnosis Date Noted  . Back pain 04/13/2017  . Nonallopathic lesion of thoracic region 04/13/2017  . Nonallopathic lesion of sacral region 04/13/2017  . Nonallopathic lesion of lumbosacral region 04/13/2017  . Impaired glucose tolerance 11/03/2016  . CAD in native artery   . Obesity   . HTN (hypertension)   . CAD S/P multiple PCIs 09/08/2014  . Hyperlipidemia 09/08/2014    Past Surgical History:  Procedure Laterality Date  . CARDIAC CATHETERIZATION N/A 05/06/2016   Procedure: Left Heart Cath and Coronary Angiography;  Surgeon: Nelva Bush, MD;  Location: [facility];  Service: Cardiovascular;  Laterality: N/A;  . CARDIAC CATHETERIZATION N/A 05/06/2016   Procedure: Coronary Stent Intervention;  Surgeon: Nelva Bush, MD;  Location: [facility];  Service: Cardiovascular;  Laterality: N/A;  . CARDIAC CATHETERIZATION N/A 05/13/2016   Procedure: Coronary/Graft Angiography;  Surgeon: Peter M Martinique, MD;  Location: [facility];  Service: Cardiovascular;  Laterality: N/A;  . CORONARY ANGIOPLASTY WITH STENT PLACEMENT  2006   in [place], occluded CFX, s/p Taxus stent  . CORONARY ANGIOPLASTY WITH STENT PLACEMENT  2007   in [place], ostial RCA stent  . CORONARY ANGIOPLASTY WITH STENT PLACEMENT  2015   in [place], RCA stent  . TONSILLECTOMY AND ADENOIDECTOMY          Home Medications    Prior to Admission medications   Medication Sig  Start Date End Date Taking? Authorizing Provider  aspirin 81 MG tablet Take 81 mg by mouth daily.   Yes [provider]  atorvastatin (LIPITOR) 80 MG tablet TAKE 1 TABLET DAILY (PLEASE CALL OFFICE AND SCHEDULE APPOINTMENT FOR ANNUAL PHYSICAL) 02/17/17  Yes Marletta Lor, MD  CHERRY PO Take 50 mg by mouth daily. Cherry extract   Yes [provider]  Cinnamon 500 MG capsule Take 500 mg by mouth 2  (two) times daily.    Yes [provider]  isosorbide mononitrate (IMDUR) 60 MG 24 hr tablet Take 1 tablet (60 mg total) by mouth daily. 09/08/16  Yes Minus Breeding, MD  nitroGLYCERIN (NITROSTAT) 0.4 MG SL tablet Place 1 tablet (0.4 mg total) under the tongue every 5 (five) minutes as needed for chest pain (up to 3 doses). 05/07/16  Yes Dunn, Nedra Hai, PA-C  ticagrelor (BRILINTA) 90 MG TABS tablet Take 1 tablet (90 mg total) by mouth 2 (two) times daily. 05/07/16  Yes Dunn, Dayna N, PA-C  TURMERIC PO Take 50 mg by mouth daily.   Yes [provider]  Vitamin D, Ergocalciferol, (DRISDOL) 50000 units CAPS capsule Take 1 capsule (50,000 Units total) by mouth every 7 (seven) days. 04/13/17  Yes Hulan Saas M, DO  ipratropium (ATROVENT) 0.06 % nasal spray Place 2 sprays into both nostrils 4 (four) times daily. Patient not taking: Reported on 04/14/2017 08/17/16   Billy Fischer, MD    Family History Family History  Problem Relation Age of Onset  . Hypertension Father   . Dementia Father        Vascular  . Heart disease Maternal Grandfather 7       Died probably of heart diseasse  . Heart disease Maternal Uncle 9       Died of "heart exploding"    Social History Social History  Substance Use Topics  . Smoking status: Never Smoker  . Smokeless tobacco: Never Used  . Alcohol use No     Allergies   Patient has no known allergies.   Review of Systems Review of Systems  Constitutional: Negative for chills and fever.  Respiratory: Positive for chest tightness. Negative for cough and shortness of breath.   Cardiovascular: Positive for chest pain.  Gastrointestinal: Negative for abdominal pain, diarrhea, nausea and vomiting.  Genitourinary: Negative for dysuria and hematuria.  Neurological: Negative for dizziness, weakness, numbness and headaches.     Physical Exam Updated Vital Signs BP 123/75   Pulse 67   Temp 97.6 F (36.4 C) (Oral)   Resp 15   Ht 5\' 6"   (1.676 m)   Wt 86.6 kg (191 lb)   SpO2 97%   BMI 30.83 kg/m   Physical Exam  Constitutional: He is oriented to person, place, and time. He appears well-developed and well-nourished.  Sitting comfortably on examination table  HENT:  Head: Normocephalic and atraumatic.  Mouth/Throat: Oropharynx is clear and moist and mucous membranes are normal.  Eyes: Pupils are equal, round, and reactive to light. Conjunctivae, EOM and lids are normal.  Neck: Full passive range of motion without pain.  Cardiovascular: Normal rate, regular rhythm, normal heart sounds and normal pulses.  Exam reveals no gallop and no friction rub.   No murmur heard. Pulmonary/Chest: Effort normal and breath sounds normal.  No tenderness palpation to anterior chest wall  Abdominal: Soft. Normal appearance. There is no tenderness. There is no rigidity and no guarding.  Musculoskeletal: Normal range of motion.  Bilateral lower  extremity edema  Neurological: He is alert and oriented to person, place, and time.  Skin: Skin is warm and dry. Capillary refill takes less than 2 seconds.  Psychiatric: He has a normal mood and affect. His speech is normal.  Nursing note and vitals reviewed.    ED Treatments / Results  Labs (all labs ordered are listed, but only abnormal results are displayed) Labs Reviewed  BASIC METABOLIC PANEL - Abnormal; Notable for the following:       Result Value   Glucose, Bld 141 (*)    All other components within normal limits  CBC  I-STAT TROPONIN, ED  I-STAT TROPONIN, ED    EKG  EKG Interpretation  Date/Time:  Thursday April 23 2017 14:20:20 EDT Ventricular Rate:  82 PR Interval:  158 QRS Duration: 92 QT Interval:  354 QTC Calculation: 413 R Axis:   40 Text Interpretation:  Normal sinus rhythm Inferior infarct , age undetermined Cannot rule out Anterior infarct , age undetermined Abnormal ECG No significant change since last tracing Confirmed by Isla Pence [phone]) on 04/23/2017  4:31:34 PM       Radiology Dg Chest 2 View  Result Date: 04/23/2017 CLINICAL DATA:  Chest pain. EXAM: CHEST  2 VIEW COMPARISON:  Radiographs of May 10, 2016. FINDINGS: The heart size and mediastinal contours are within normal limits. Both lungs are clear. No pneumothorax or pleural effusion is noted. The visualized skeletal structures are unremarkable. IMPRESSION: No active cardiopulmonary disease. Electronically Signed   By: Marijo Conception, M.D.   On: 04/23/2017 14:57    Procedures Procedures (including critical care time)  Medications Ordered in ED Medications - No data to display   Initial Impression / Assessment and Plan / ED Course  I have reviewed the triage vital signs and the nursing notes.  Pertinent labs & imaging results that were available during my care of the patient were reviewed by me and considered in my medical decision making (see chart for details).     61 year old male with past medical history of angina, CAD, prior catheterization who presents with left-sided chest tightness/pain that began at 9:30 this morning. Initially relieved by nitroglycerin. Return of pain at 12:30 PM. Second nitroglycerin was slowed to decrease the pain probably ED visit. Patient is afebrile, non-toxic appearing, sitting comfortably on examination table. Vital signs reviewed and stable. Patient is currently denying any pain while in the ED. Consider ACS etiology versus versus worsening of angina vs acute infectious etiology versus musculoskeletal pain vs anxiety. Initial labs and imaging ordered at triage including CBC, BMP, troponin, EKG, chest x-ray. Offered patient analgesics but he declined at this time.  Labs and imaging reviewed. CBC is unremarkable. BMP shows slight hyperglycemia but otherwise unremarkable. Initial i-STAT troponin is negative. Chest x-ray negative for any acute infectious etiology or fluid overload. EKG shows normal sinus rhythm, rate 82. Looks consistent with  previous.   Patient's history/presentation, he has a heart score of 4. Will plan to delta trop. If troponin is negative can plan to discharge patient home with cardiac outpatient follow-up. Discussed patient with Dr. Gilford Raid.   Repeat troponin negative. Discussed results with patient. He still states that he has no pain. Suspect that stress may play component intubation symptoms. He reports he has recently been very stressed at work. Vital signs stable. Stable for discharge at this time. Instructed patient follow-up with his cardiologist in the next 2-4 days for further evaluation. Strict return precautions discussed. Patient expresses understanding and agreement to  plan.   Final Clinical Impressions(s) / ED Diagnoses   Final diagnoses:  Chest pain, unspecified type    New Prescriptions New Prescriptions   No medications on file     Desma Mcgregor 04/23/17 Dortha Schwalbe, MD 04/23/17 [phone]

## 2017-05-11 ENCOUNTER — Other Ambulatory Visit: Payer: Self-pay | Admitting: Internal Medicine

## 2017-05-18 ENCOUNTER — Ambulatory Visit (INDEPENDENT_AMBULATORY_CARE_PROVIDER_SITE_OTHER): Admitting: Family Medicine

## 2017-05-18 ENCOUNTER — Encounter: Payer: Self-pay | Admitting: Family Medicine

## 2017-05-18 VITALS — BP 128/72 | HR 72 | Ht 66.0 in | Wt 192.0 lb

## 2017-05-18 DIAGNOSIS — M549 Dorsalgia, unspecified: Secondary | ICD-10-CM

## 2017-05-18 DIAGNOSIS — M999 Biomechanical lesion, unspecified: Secondary | ICD-10-CM | POA: Diagnosis not present

## 2017-05-18 DIAGNOSIS — G8929 Other chronic pain: Secondary | ICD-10-CM

## 2017-05-18 NOTE — Assessment & Plan Note (Signed)
Still think patient's back pain is mostly secondary to muscle imbalances. Patient is making some progress. Continue the same medications including the vitamin D. Encourage patient to continue to work on core strength. Discussed ergonomics in greater detail today. Follow-up again in 6 weeks for further evaluation and treatment

## 2017-05-18 NOTE — Patient Instructions (Addendum)
Good to see you  I am glad yo uare making som e progress We should continue to improv.e  DHEA 4 weeks on 2 weeks off and repeat as necessary  Continue the other vitamins, try turmeric 2 times a day  Ice is your friend Keep it up  See me again in 4-6 weeks

## 2017-05-18 NOTE — Progress Notes (Signed)
[name] [address] [place], [place] [address] Phone: [phone] Subjective:    I'm seeing this patient by the request  of:  Marletta Lor, MD   CC: back pain f/u  [id number]  Michael [name] is a 61 y.o. male coming in with complaint of Back pain. Patient was seen previously and did have more of a mild osteophytic changes of the back. This was confirmed with x-rays that were independently visualized by me. Patient has been making some progress at this time. Doing the home exercises and feels that the hip abductor strengthening has been very helpful. Feels that the manipulation was helpful as well. No new symptoms just some mild worsening of previous symptoms.      Past Medical History:  Diagnosis Date  . Anginal pain ([place])   . CAD S/P PCI to Cx & RCA    a. s/p PCI of LCx (2006) and ostium of RCA (2007) as well as DES to RCA in [place] (05/2014)  . HTN (hypertension)   . Hyperlipidemia   . Myocardial infarction ([place])   . Obesity    Past Surgical History:  Procedure Laterality Date  . CARDIAC CATHETERIZATION N/A 05/06/2016   Procedure: Left Heart Cath and Coronary Angiography;  Surgeon: Nelva Bush, MD;  Location: [facility];  Service: Cardiovascular;  Laterality: N/A;  . CARDIAC CATHETERIZATION N/A 05/06/2016   Procedure: Coronary Stent Intervention;  Surgeon: Nelva Bush, MD;  Location: [facility];  Service: Cardiovascular;  Laterality: N/A;  . CARDIAC CATHETERIZATION N/A 05/13/2016   Procedure: Coronary/Graft Angiography;  Surgeon: Peter M Martinique, MD;  Location: [facility];  Service: Cardiovascular;  Laterality: N/A;  . CORONARY ANGIOPLASTY WITH STENT PLACEMENT  2006   in [place], occluded CFX, s/p Taxus stent  . CORONARY ANGIOPLASTY WITH STENT PLACEMENT  2007   in [place], ostial RCA stent  . CORONARY ANGIOPLASTY WITH STENT PLACEMENT  2015   in [place], RCA stent  .  TONSILLECTOMY AND ADENOIDECTOMY      Social History   Social History  . Marital status: Married    Spouse name: N/A  . Number of children: 4  . Years of education: N/A   Social History Main Topics  . Smoking status: Never Smoker  . Smokeless tobacco: Never Used  . Alcohol use No  . Drug use: No  . Sexual activity: Yes   Other Topics Concern  . 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 Hypertension Father   . Dementia Father        Vascular  . Heart disease Maternal Grandfather 44       Died probably of heart diseasse  . Heart disease Maternal Uncle 43       Died of "heart exploding"     Past medical history, social, surgical and family history all reviewed in electronic medical record.  No pertanent information unless stated regarding to the chief complaint.   Review of Systems: No headache, visual changes, nausea, vomiting, diarrhea, constipation, dizziness, abdominal pain, skin rash, fevers, chills, night sweats, weight loss, swollen lymph nodes, body aches, joint swelling, muscle aches, chest pain, shortness of breath, mood changes.    Objective  Blood pressure 128/72, pulse 72, height 5\' 6"  (1.676 m), weight 192 lb (87.1 kg).   Systems  examined below as of 05/18/17 General: NAD A&O x3 mood, affect normal  HEENT: Pupils equal, extraocular movements intact no nystagmus Respiratory: not short of breath at rest or with speaking Cardiovascular: No lower extremity edema, non tender Skin: Warm dry intact with no signs of infection or rash on extremities or on axial skeleton. Abdomen: Soft nontender, no masses Neuro: Cranial nerves  intact, neurovascularly intact in all extremities with 2+ DTRs and 2+ pulses. Lymph: No lymphadenopathy appreciated today  Gait normal with good balance and coordination.  MSK: Non tender with full range of motion  and good stability and symmetric strength and tone of shoulders, elbows, wrist,  knee hips and ankles bilaterally.   Back Exam:  Inspection: Significant loss of lordosis. Motion: Flexion 35 deg, Extension 15 deg, Side Bending to 30deg bilaterally,  Rotation to 30 deg bilaterally  SLR laying: Negative  XSLR laying: Negative  Palpable tenderness: Mild discomfort in the paraspinal musculature lumbar spine but improved from previous exam FABER: Continued tenderness noted bilaterally Sensory change: Gross sensation intact to all lumbar and sacral dermatomes.  Reflexes: 2+ at both patellar tendons, 2+ at achilles tendons, Babinski's downgoing.  Strength at foot  4 out of 5 but symmetric  Osteopathic findings T4 extended rotated and side bent right  T7 extended rotated and side bent left L3 flexed rotated and side bent right Sacrum right on right      Impression and Recommendations:     This case required medical decision making of moderate complexity.      Note: This dictation was prepared with Dragon dictation along with smaller phrase technology. Any transcriptional errors that result from this process are unintentional.

## 2017-05-18 NOTE — Assessment & Plan Note (Signed)
Decision today to treat with OMT was based on Physical Exam  After verbal consent patient was treated with HVLA, ME, FPR techniques in cervical, thoracic, lumbar and sacral areas  Patient tolerated the procedure well with improvement in symptoms  Patient given exercises, stretches and lifestyle modifications  See medications in patient instructions if given  Patient will follow up in 6 weeks 

## 2017-05-21 ENCOUNTER — Ambulatory Visit (INDEPENDENT_AMBULATORY_CARE_PROVIDER_SITE_OTHER): Admitting: Cardiology

## 2017-05-21 ENCOUNTER — Encounter: Payer: Self-pay | Admitting: Cardiology

## 2017-05-21 VITALS — BP 112/66 | HR 70 | Ht 66.0 in | Wt 190.0 lb

## 2017-05-21 DIAGNOSIS — I2510 Atherosclerotic heart disease of native coronary artery without angina pectoris: Secondary | ICD-10-CM

## 2017-05-21 DIAGNOSIS — R079 Chest pain, unspecified: Secondary | ICD-10-CM

## 2017-05-21 DIAGNOSIS — E785 Hyperlipidemia, unspecified: Secondary | ICD-10-CM | POA: Diagnosis not present

## 2017-05-21 DIAGNOSIS — Z9861 Coronary angioplasty status: Secondary | ICD-10-CM | POA: Diagnosis not present

## 2017-05-21 DIAGNOSIS — I10 Essential (primary) hypertension: Secondary | ICD-10-CM

## 2017-05-21 NOTE — Assessment & Plan Note (Signed)
On statin Rx- followed by PCP 

## 2017-05-21 NOTE — Patient Instructions (Signed)
Medication Instructions: Your physician recommends that you continue on your current medications as directed. Please refer to the Current Medication list given to you today.  If you need a refill on your cardiac medications before your next appointment, please call your pharmacy.    Follow-Up: Your physician wants you to follow-up in: 6 months with Dr. Percival Spanish. You will receive a reminder letter in the mail two months in advance. If you don't receive a letter, please call our office to schedule this follow-up appointment.    Thank you for choosing Heart[facility] at [facility]!!

## 2017-05-21 NOTE — Progress Notes (Signed)
05/21/2017 [name]   1956/05/29  [id number]  Primary Physician Marletta Lor, MD Primary Cardiologist: Dr Percival Spanish  HPI:  61y/o male moved here in 79 from [place]. He has a history of prior PCI there. Dr Percival Spanish initially saw him in Dec 2015 to get established.   In Sept 2016 he was admitted with [place]- Myoview then was low risk. He then presented 05/05/16 with [place] (Troponin negative). Cath revealed patent stents in the RCA and CFX with disease in his mid LAD. He received two DES then. He was discharged then re admitted with similar chest pain and Troponin elevation 05/13/16. Re look cath showed patent LAD stents, CFX stent, and RCA stent. There was a 70% small Dx2 branch narrowing. There was no loss of side branches noted. It was suspected he had spasm. Nitrates were added.   He was seen in follow up Sept 2017 and was doing well. He is no longer working part time because he felt it was stressful and he is now taking care of his grand kids.  Recently he was in the ED with complaints of chest pain. The pt describes an "ache" after he drinks his protein powder (chocolate flavored). He has taken NTG for this and felt it helped. He did note that if he cut it back to one scoop instead of two he had less symptoms. He mows a large yard with a push mower without chest pain. His Troponin was negative in the ED 04/24/17 and he is seen now for follow up.     Current Outpatient Prescriptions  Medication Sig Dispense Refill  . aspirin 81 MG tablet Take 81 mg by mouth daily.    [name] atorvastatin (LIPITOR) 80 MG tablet TAKE 1 TABLET DAILY (PLEASE CALL OFFICE AND SCHEDULE APPOINTMENT FOR ANNUAL PHYSICAL) 90 tablet 0  . CHERRY PO Take 50 mg by mouth daily. Duncan extract    . Cinnamon 500 MG capsule Take 500 mg by mouth 2 (two) times daily.     . isosorbide mononitrate (IMDUR) 60 MG 24 hr tablet Take 1 tablet (60 mg total) by mouth daily. 90 tablet 3  . nitroGLYCERIN (NITROSTAT) 0.4 MG SL  tablet Place 1 tablet (0.4 mg total) under the tongue every 5 (five) minutes as needed for chest pain (up to 3 doses). 25 tablet 3  . ticagrelor (BRILINTA) 90 MG TABS tablet Take 1 tablet (90 mg total) by mouth 2 (two) times daily. 180 tablet 3  . TURMERIC PO Take 50 mg by mouth daily.    . Vitamin D, Ergocalciferol, (DRISDOL) 50000 units CAPS capsule Take 1 capsule (50,000 Units total) by mouth every 7 (seven) days. 12 capsule 0   No current facility-administered medications for this visit.     No Known Allergies  Past Medical History:  Diagnosis Date  . Anginal pain ([place])   . CAD S/P PCI to Cx & RCA    a. s/p PCI of LCx (2006) and ostium of RCA (2007) as well as DES to RCA in [place] (05/2014)  . HTN (hypertension)   . Hyperlipidemia   . Myocardial infarction ([place])   . Obesity     Social History   Social History  . Marital status: Married    Spouse name: N/A  . Number of children: 4  . Years of education: N/A   Occupational History  . Not on file.   Social History Main Topics  . Smoking status: Never Smoker  . Smokeless tobacco: Never  Used  . Alcohol use No  . Drug use: No  . Sexual activity: Yes   Other Topics Concern  . 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 Hypertension Father   . Dementia Father        Vascular  . Heart disease Maternal Grandfather 63       Died probably of heart diseasse  . Heart disease Maternal Uncle 47       Died of "heart exploding"     Review of Systems: General: negative for chills, fever, night sweats or weight changes.  Cardiovascular: negative for chest pain, dyspnea on exertion, edema, orthopnea, palpitations, paroxysmal nocturnal dyspnea or shortness of breath Dermatological: negative for rash Respiratory: negative for cough or wheezing Urologic: negative for hematuria Abdominal: negative for nausea,  vomiting, diarrhea, bright red blood per rectum, melena, or hematemesis Neurologic: negative for visual changes, syncope, or dizziness All other systems reviewed and are otherwise negative except as noted above.    Blood pressure 112/66, pulse 70, height 5\' 6"  (1.676 m), weight 190 lb (86.2 kg).  General appearance: alert, cooperative and no distress Neck: no carotid bruit and no JVD Lungs: clear to auscultation bilaterally Heart: regular rate and rhythm Extremities: extremities normal, atraumatic, no cyanosis or edema Skin: Skin color, texture, turgor normal. No rashes or lesions Neurologic: Grossly normal  EKG  Note: EKG Sept 2017 was normal           EKG in ED 04/24/17 was abnormal with inferior Qs and poor anterior RW           EKG today is again completely normal  ASSESSMENT AND PLAN:   Chest pain with moderate risk of acute coronary syndrome Pt seen in the ED 3 weeks ago with chest pain. His pain does not sound anginal but his EKG was abnormal c/w Sept 2017 EKG.   CAD S/P multiple PCIs Taxus stent to circumflex in 2006, stent to right coronary artery in 2007, DES to RCA 2015 in [place] Chest pain Sept 2016- Myoview low risk [place] 05/05/16- LAD DES x 2 [place] 05/13/16 with Troponin elelvation- cath showed patent stents- ? spasm    HTN (hypertension) Controlled  Dyslipidemia On statin Rx followed by PCP   PLAN  I suggested her change from chocolate to vanilla protein powder and see if that helps. He has no exertional angina symptoms but if he develops that he will contact us.   Kerin Ransom PA-C 05/21/2017 2:30 PM

## 2017-05-21 NOTE — Assessment & Plan Note (Signed)
Taxus stent to circumflex in 2006, stent to right coronary artery in 2007, DES to RCA 2015 in [place] Chest pain Sept 2016- Myoview low risk [place] 05/05/16- LAD DES x 2 [place] 05/13/16 with Troponin elelvation- cath showed patent stents- ? spasm

## 2017-05-21 NOTE — Assessment & Plan Note (Signed)
Controlled.  

## 2017-05-21 NOTE — Assessment & Plan Note (Signed)
Pt seen in the ED 3 weeks ago with chest pain. His pain does not sound anginal but his EKG was abnormal c/w Sept 2017 EKG.

## 2017-06-11 ENCOUNTER — Encounter: Payer: Self-pay | Admitting: Internal Medicine

## 2017-06-16 ENCOUNTER — Other Ambulatory Visit: Payer: Self-pay | Admitting: Physician Assistant

## 2017-06-27 NOTE — Progress Notes (Signed)
[name] [address] [place], [place] [address] Phone: [phone] Subjective:    I'm seeing this patient by the request  of:  Marletta Lor, MD   CC: back pain f/u  XNA:[id number]  Michael [name] is a 61 y.o. male coming in with complaint of Back pain. Patient was seen previously and did have more of a mild osteophytic changes of the back. This was confirmed with x-rays that were independently visualized by me. Patient was making some progress and we did start with osteopathic manipulation as well. Has been 6 weeks since we seen him. Patient states Making progress. Still having pain in the mid back. Patient describes the pain as a dull, throbbing aching pain. Ms. placed his exercises and has not been doing the stretching. Patient denies any radiation down the legs any numbness or tingling. States that he does feel like he is making some progress with some of the vitamins.  Lateral increasing pain recently. Has had plantar fasciitis previously. Had custom orthotics and tried to wear them but not noticing as much improvement.      Past Medical History:  Diagnosis Date  . Anginal pain ([place])   . CAD S/P PCI to Cx & RCA    a. s/p PCI of LCx (2006) and ostium of RCA (2007) as well as DES to RCA in [place] (05/2014)  . HTN (hypertension)   . Hyperlipidemia   . Myocardial infarction ([place])   . Obesity    Past Surgical History:  Procedure Laterality Date  . CARDIAC CATHETERIZATION N/A 05/06/2016   Procedure: Left Heart Cath and Coronary Angiography;  Surgeon: Nelva Bush, MD;  Location: [facility];  Service: Cardiovascular;  Laterality: N/A;  . CARDIAC CATHETERIZATION N/A 05/06/2016   Procedure: Coronary Stent Intervention;  Surgeon: Nelva Bush, MD;  Location: [facility];  Service: Cardiovascular;  Laterality: N/A;  . CARDIAC CATHETERIZATION N/A 05/13/2016   Procedure: Coronary/Graft Angiography;  Surgeon: Peter M  Martinique, MD;  Location: [facility];  Service: Cardiovascular;  Laterality: N/A;  . CORONARY ANGIOPLASTY WITH STENT PLACEMENT  2006   in [place], occluded CFX, s/p Taxus stent  . CORONARY ANGIOPLASTY WITH STENT PLACEMENT  2007   in [place], ostial RCA stent  . CORONARY ANGIOPLASTY WITH STENT PLACEMENT  2015   in [place], RCA stent  . TONSILLECTOMY AND ADENOIDECTOMY      Social History   Social History  . Marital status: Married    Spouse name: N/A  . Number of children: 4  . Years of education: N/A   Social History Main Topics  . Smoking status: Never Smoker  . Smokeless tobacco: Never Used  . Alcohol use No  . Drug use: No  . Sexual activity: Yes   Other Topics Concern  . 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 Hypertension Father   . Dementia Father        Vascular  . Heart disease Maternal Grandfather 61       Died probably of heart diseasse  . Heart disease Maternal Uncle 39       Died of "heart exploding"     Past medical history, social, surgical and family history all reviewed in electronic medical record.  No pertanent information unless stated regarding to the chief complaint.  Review of Systems: No headache, visual changes, nausea, vomiting, diarrhea, constipation, dizziness, abdominal pain, skin rash, fevers, chills, night sweats, weight loss, swollen lymph nodes, body aches, joint swelling, chest pain, shortness of breath, mood changes. Positive muscle aches   Objective  Blood pressure 124/70, pulse 66, height 5\' 6"  (1.676 m), weight 194 lb (88 kg), SpO2 98 %.   Systems examined below as of 06/29/17 General: NAD A&O x3 mood, affect normal  HEENT: Pupils equal, extraocular movements intact no nystagmus Respiratory: not short of breath at rest or with speaking Cardiovascular: No lower extremity edema, non  tender Skin: Warm dry intact with no signs of infection or rash on extremities or on axial skeleton. Abdomen: Soft nontender, no masses Neuro: Cranial nerves  intact, neurovascularly intact in all extremities with 2+ DTRs and 2+ pulses. Lymph: No lymphadenopathy appreciated today  Gait normal with good balance and coordination.  MSK: Non tender with full range of motion and good stability and symmetric strength and tone of shoulders, elbows, wrist,  knee hips and ankles bilaterally.   Back Exam:  Inspection: Significant loss of lordosis. Motion: Flexion 35 deg, Extension 15 deg, Side Bending to 30deg bilaterally,  Rotation to 30 deg bilaterally  SLR laying: Negative  XSLR laying: Negative  Palpable tenderness: Mild discomfort in the paraspinal musculature lumbar spine but improved from previous exam FABER: Continued tenderness noted bilaterally Sensory change: Gross sensation intact to all lumbar and sacral dermatomes.  Reflexes: 2+ at both patellar tendons, 2+ at achilles tendons, Babinski's downgoing.  Strength at foot  4 out of 5 but symmetric No some tightness noted of the infectious bilaterally  Osteopathic findings C2 flexed rotated and side bent right T3 extended rotated and side bent right inhaled third rib T9 extended rotated and side bent left L3 flexed rotated and side bent right Sacrum right on right       Impression and Recommendations:     This case required medical decision making of moderate complexity.      Note: This dictation was prepared with Dragon dictation along with smaller phrase technology. Any transcriptional errors that result from this process are unintentional.

## 2017-06-29 ENCOUNTER — Ambulatory Visit (INDEPENDENT_AMBULATORY_CARE_PROVIDER_SITE_OTHER): Admitting: Family Medicine

## 2017-06-29 ENCOUNTER — Encounter: Payer: Self-pay | Admitting: Family Medicine

## 2017-06-29 VITALS — BP 124/70 | HR 66 | Ht 66.0 in | Wt 194.0 lb

## 2017-06-29 DIAGNOSIS — M999 Biomechanical lesion, unspecified: Secondary | ICD-10-CM | POA: Diagnosis not present

## 2017-06-29 DIAGNOSIS — G8929 Other chronic pain: Secondary | ICD-10-CM

## 2017-06-29 DIAGNOSIS — M549 Dorsalgia, unspecified: Secondary | ICD-10-CM | POA: Diagnosis not present

## 2017-06-29 NOTE — Assessment & Plan Note (Signed)
Patient is a known arthritis and I do think patient has some spasming of the hip flexor. Given exercises again today. We discussed icing regimen, we discussed which activities doing which ones to avoid. Encourage patient to continue to stay active. Follow-up again in 4 weeks

## 2017-06-29 NOTE — Patient Instructions (Signed)
Good to see you  Michael [name] is your friend.  Exercises 3 times a week.  Xelero shoes could be good or brooks, keen or merrell.  Avoid being barefoot Spenco orthotics "total support" online would be great  See me again In 6-7 weeks.

## 2017-06-29 NOTE — Assessment & Plan Note (Signed)
Decision today to treat with OMT was based on Physical Exam  After verbal consent patient was treated with HVLA, ME, FPR techniques in cervical, thoracic, lumbar and sacral areas  Patient tolerated the procedure well with improvement in symptoms  Patient given exercises, stretches and lifestyle modifications  See medications in patient instructions if given  Patient will follow up in 4-6 weeks 

## 2017-07-23 ENCOUNTER — Other Ambulatory Visit: Payer: Self-pay | Admitting: Physician Assistant

## 2017-07-24 IMAGING — DX DG CHEST 2V
2 series · 2 of 2 positions shown · non-contrast
Comparison: 06/10/2015

CLINICAL DATA: 60-year-old male with a history of chest pain

EXAM:
CHEST  2 VIEW

[chest pa]
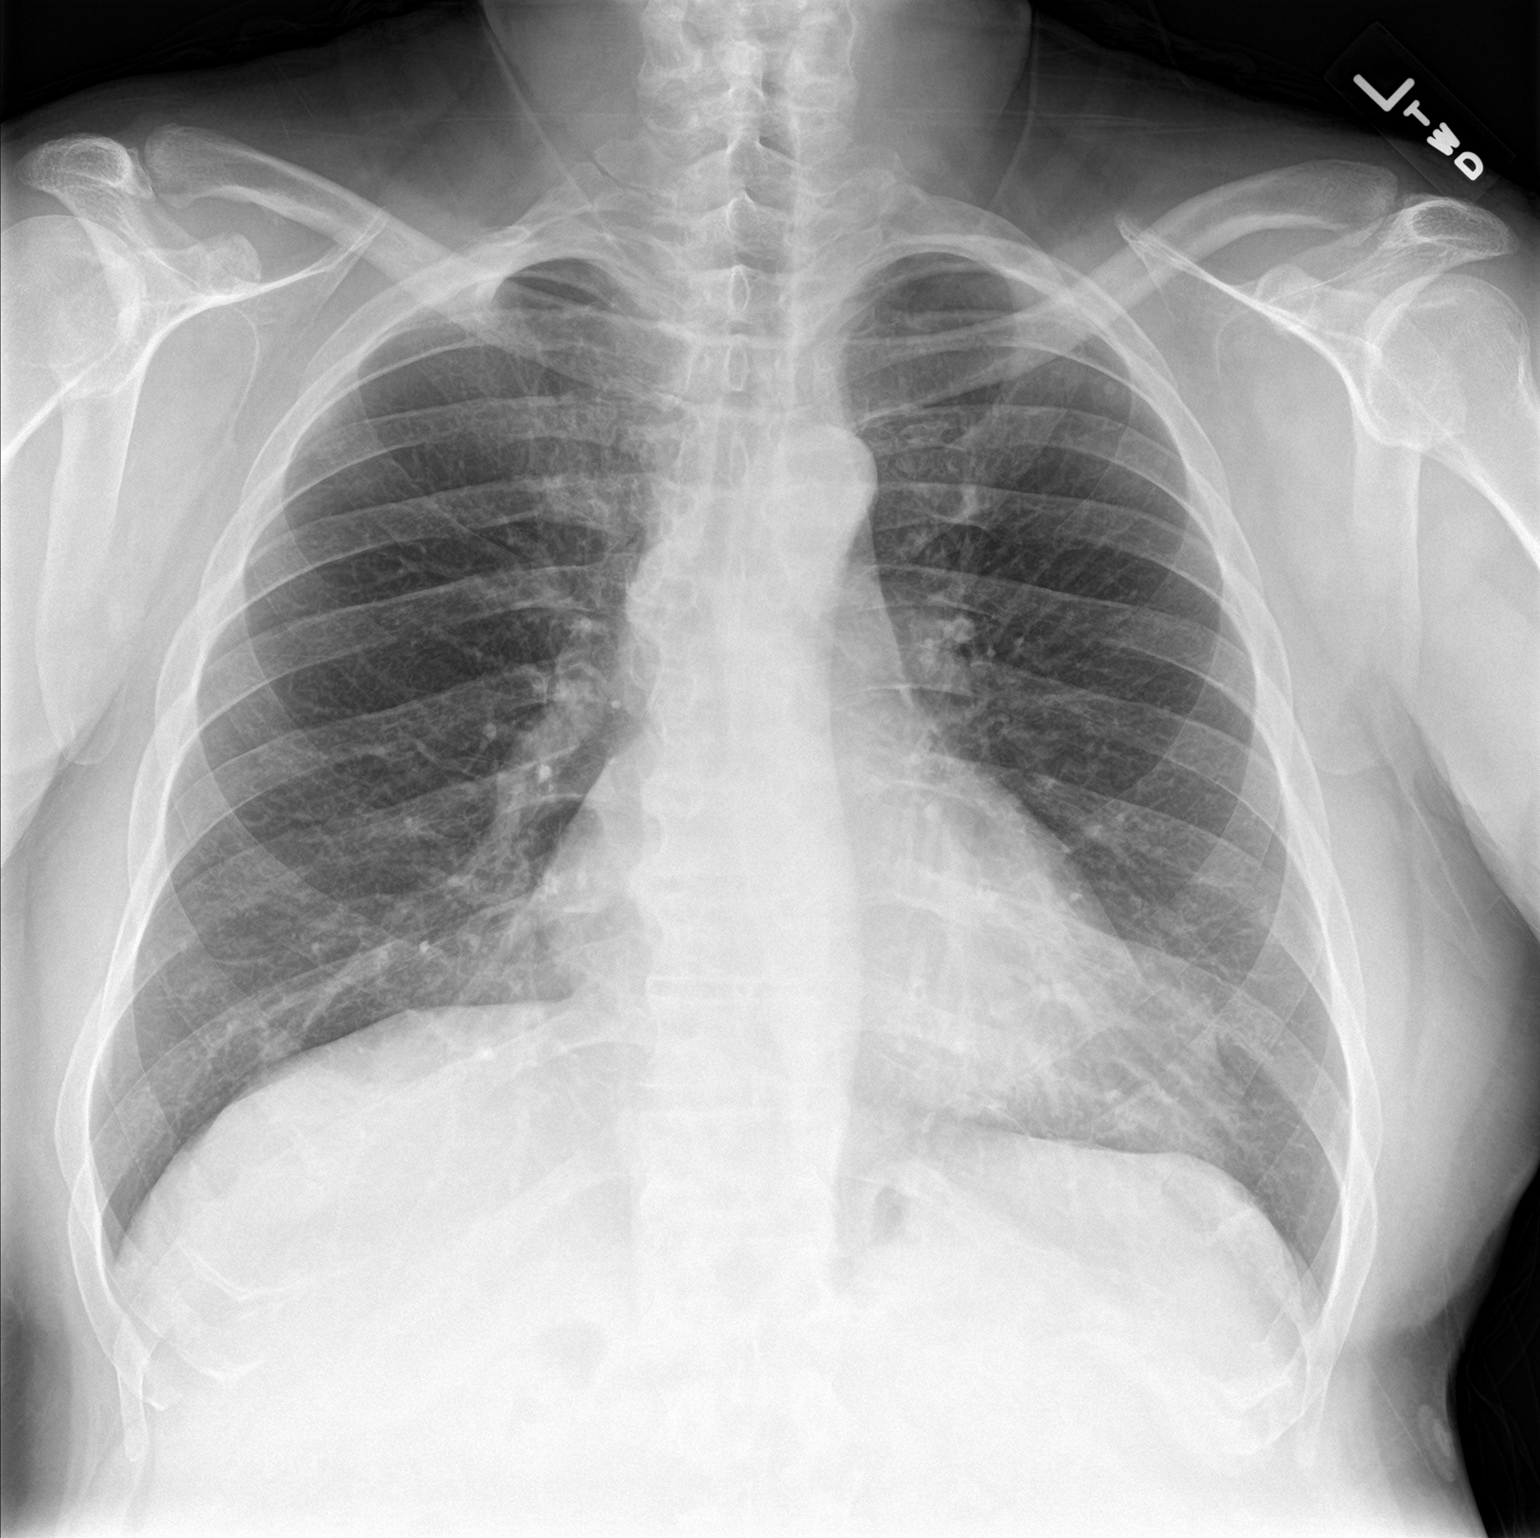

[chest lat]
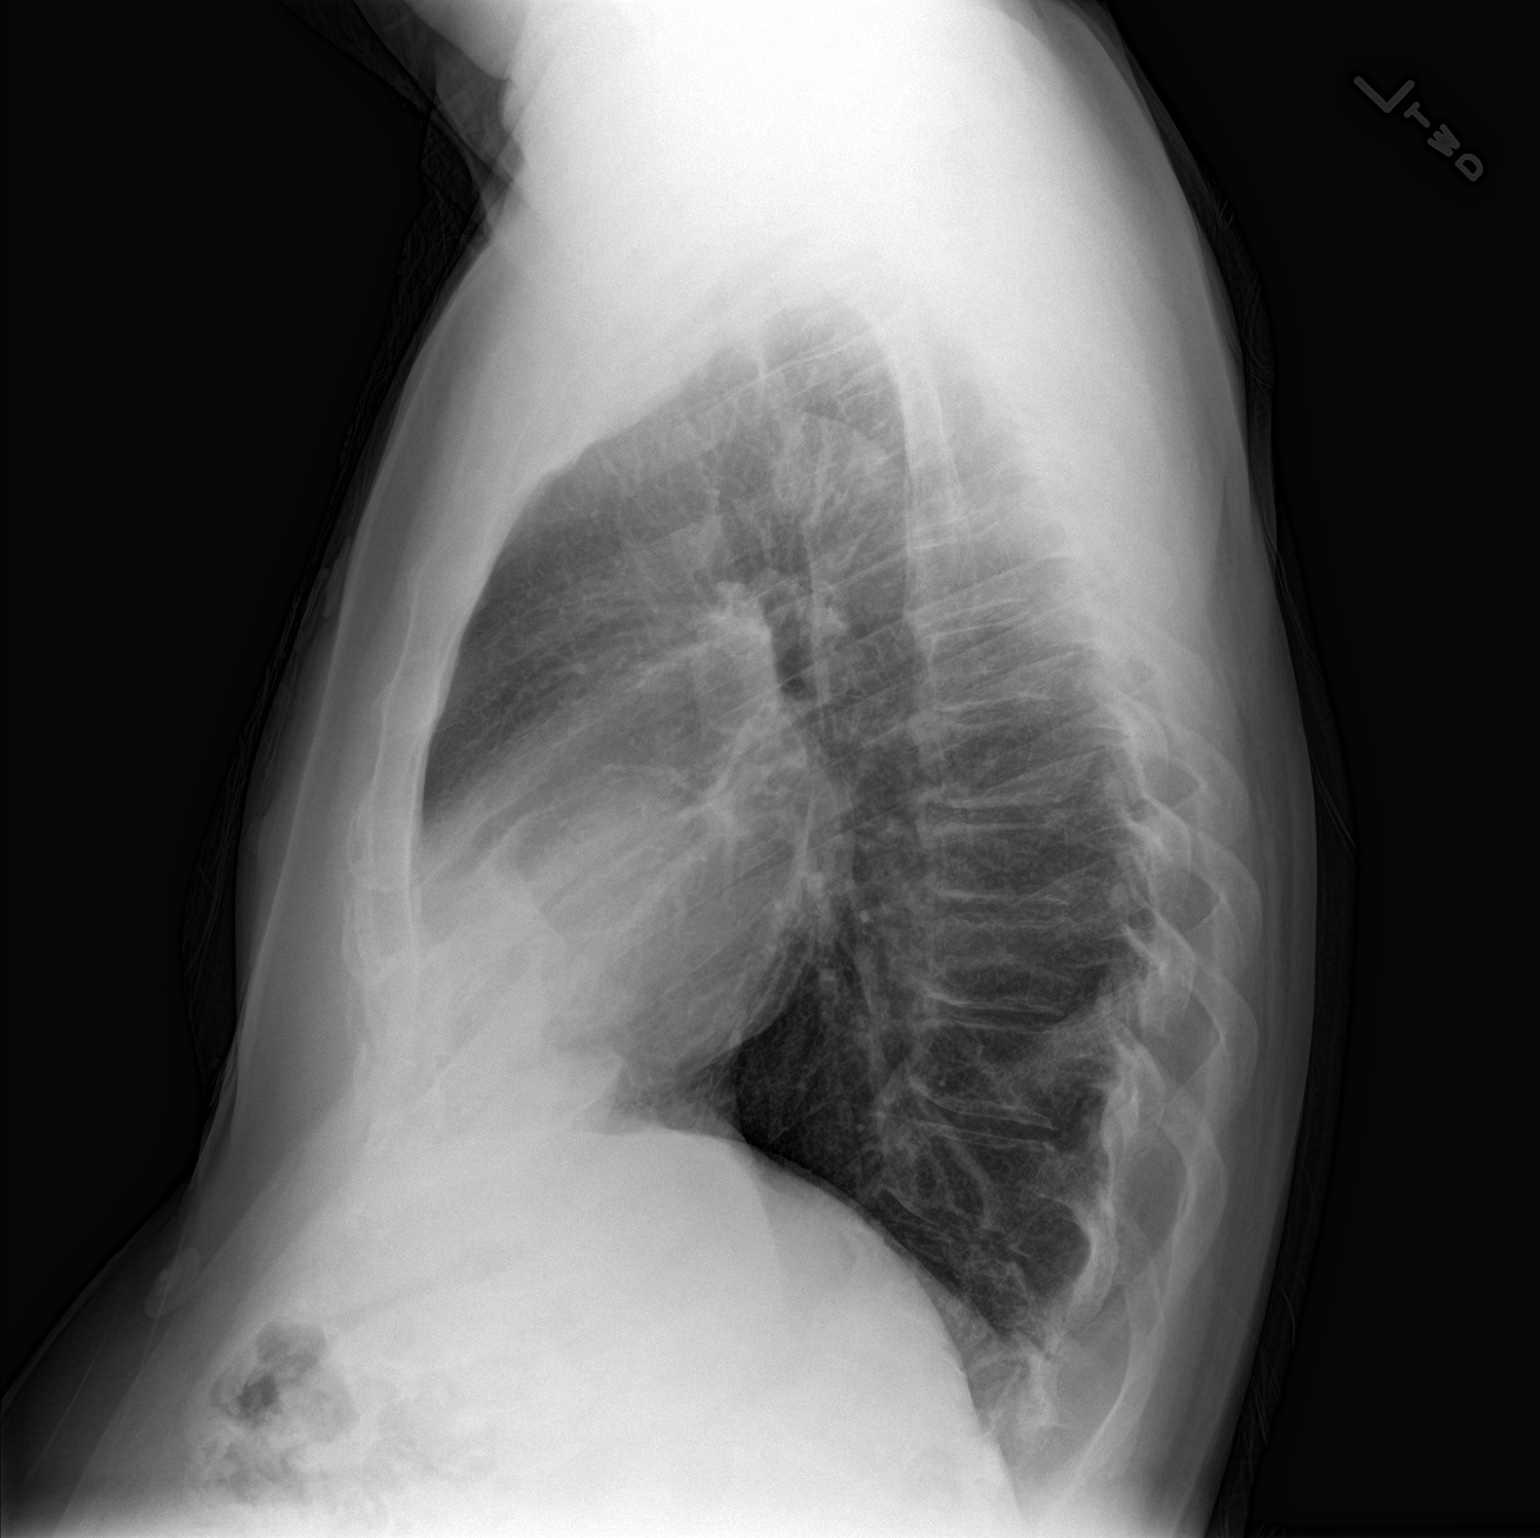

[2 of 2 positions shown; findings below may reference images not displayed]

FINDINGS: The heart size and mediastinal contours are within normal limits.
Both lungs are clear. The visualized skeletal structures are
unremarkable.
IMPRESSION: No active cardiopulmonary disease.

## 2017-08-10 ENCOUNTER — Ambulatory Visit (INDEPENDENT_AMBULATORY_CARE_PROVIDER_SITE_OTHER): Admitting: Family Medicine

## 2017-08-10 ENCOUNTER — Encounter: Payer: Self-pay | Admitting: Family Medicine

## 2017-08-10 VITALS — BP 118/80 | HR 77 | Ht 66.0 in | Wt 196.0 lb

## 2017-08-10 DIAGNOSIS — M999 Biomechanical lesion, unspecified: Secondary | ICD-10-CM | POA: Diagnosis not present

## 2017-08-10 DIAGNOSIS — G8929 Other chronic pain: Secondary | ICD-10-CM

## 2017-08-10 DIAGNOSIS — M549 Dorsalgia, unspecified: Secondary | ICD-10-CM

## 2017-08-10 MED ORDER — VITAMIN D (ERGOCALCIFEROL) 1.25 MG (50000 UNIT) PO CAPS: 50000.0000 [IU] | ORAL_CAPSULE | ORAL | 0 refills | Status: DC

## 2017-08-10 NOTE — Progress Notes (Signed)
[name] [place] [address] [place], Michael [name] [address] Phone: [phone] Subjective:     CC: Back pain follow-up  PPI:[id number]  [name] is a 61 y.o. male coming in with complaint of back pain. Found to have mild OA.  Patient is doing really well with conservative therapy.  Patient has been doing home exercises and icing regimen.  States that the back is somewhat tight but states that he has been able to even manipulate himself from time to time.  Responds well to osteopathic manipulation     Past Medical History:  Diagnosis Date  . Anginal pain ([place])   . CAD S/P PCI to Cx & RCA    a. s/p PCI of LCx (2006) and ostium of RCA (2007) as well as DES to RCA in [place] (05/2014)  . HTN (hypertension)   . Hyperlipidemia   . Myocardial infarction ([place])   . Obesity    Past Surgical History:  Procedure Laterality Date  . CORONARY ANGIOPLASTY WITH STENT PLACEMENT  2006   in [place], occluded CFX, s/p Taxus stent  . CORONARY ANGIOPLASTY WITH STENT PLACEMENT  2007   in [place], ostial RCA stent  . CORONARY ANGIOPLASTY WITH STENT PLACEMENT  2015   in [place], RCA stent  . Coronary Stent Intervention N/A 05/06/2016   Performed by Nelva Bush, MD at [facility]  . Coronary/Graft Angiography N/A 05/13/2016   Performed by Martinique, Peter M, MD at [facility]  . Left Heart Cath and Coronary Angiography N/A 05/06/2016   Performed by Nelva Bush, MD at [facility]  . TONSILLECTOMY AND ADENOIDECTOMY      Social History   Socioeconomic History  . Marital status: Married    Spouse name: None  . Number of children: 4  . Years of education: None  . Highest education level: None  Social Needs  . Financial resource strain: None  . Food insecurity - worry: None  . Food insecurity - inability: None  . Transportation needs - medical: None  . Transportation needs - non-medical: None  Occupational History  . None   Tobacco Use  . Smoking status: Never Smoker  . Smokeless tobacco: Never Used  Substance and Sexual Activity  . Alcohol use: No    Alcohol/week: 0.0 oz  . Drug use: No  . Sexual activity: Yes  Other Topics Concern  . 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 Hypertension Father   . Dementia Father        Vascular  . Heart disease Maternal Grandfather 62       Died probably of heart diseasse  . Heart disease Maternal Uncle 69       Died of "heart exploding"     Past medical history, social, surgical and family history all reviewed in electronic medical record.  No pertanent information unless stated regarding to the chief complaint.   Review of Systems:Review of systems updated and as accurate as of 08/10/17  No headache, visual changes, nausea, vomiting, diarrhea, constipation, dizziness, abdominal pain, skin rash, fevers, chills, night sweats, weight loss, swollen lymph nodes, body aches, joint swelling, chest pain, shortness of breath, mood changes.  Positive muscle aches  Objective  Blood pressure 118/80, pulse 77, height 5\' 6"  (1.676 m), weight 196 lb (88.9 kg),  SpO2 98 %. Systems examined below as of 08/10/17   General: No apparent distress alert and oriented x3 mood and affect normal, dressed appropriately.  HEENT: Pupils equal, extraocular movements intact  Respiratory: Patient's speak in full sentences and does not appear short of breath  Cardiovascular: No lower extremity edema, non tender, no erythema  Skin: Warm dry intact with no signs of infection or rash on extremities or on axial skeleton.  Abdomen: Soft nontender  Neuro: Cranial nerves II through XII are intact, neurovascularly intact in all extremities with 2+ DTRs and 2+ pulses.  Lymph: No lymphadenopathy of posterior or anterior cervical chain or axillae bilaterally.  Gait normal  with good balance and coordination.  MSK:  Non tender with full range of motion and good stability and symmetric strength and tone of shoulders, elbows, wrist, hip, knee and ankles bilaterally.   Back Exam:  Inspection: Mild loss of lordosis Motion: Flexion 45 deg, Extension 20 deg, Side Bending to 35 deg bilaterally,  Rotation to 45 deg bilaterally  SLR laying: Negative  XSLR laying: Negative  Palpable tenderness: Tender to palpation in the paraspinal musculature of the lumbar spine. FABER: Fever positive bilateral. Sensory change: Gross sensation intact to all lumbar and sacral dermatomes.  Reflexes: 2+ at both patellar tendons, 2+ at achilles tendons, Babinski's downgoing.  Strength at foot  Plantar-flexion: 5/5 Dorsi-flexion: 5/5 Eversion: 5/5 Inversion: 5/5  Leg strength  Quad: 5/5 Hamstring: 5/5 Hip flexor: 5/5 Hip abductors: 5/5  Gait unremarkable.  Osteopathic findings  C2 flexed rotated and side bent left T3 extended rotated and side bent right inhaled third rib T7 extended rotated and side bent right L3 flexed rotated and side bent right Sacrum right on right     Impression and Recommendations:     This case required medical decision making of moderate complexity.      Note: This dictation was prepared with Dragon dictation along with smaller phrase technology. Any transcriptional errors that result from this process are unintentional.

## 2017-08-10 NOTE — Assessment & Plan Note (Signed)
Patient has been doing remarkably well.  Encourage patient to continue to try to increase activity.  We discussed core strength and stability.  Patient will continue with this.  Follow-up with me again in 8 weeks.

## 2017-08-10 NOTE — Patient Instructions (Addendum)
2 months Happy holidays! Refilled vitamin D

## 2017-08-10 NOTE — Assessment & Plan Note (Signed)
Decision today to treat with OMT was based on Physical Exam  After verbal consent patient was treated with HVLA, ME, FPR techniques in cervical, thoracic, lumbar and sacral areas  Patient tolerated the procedure well with improvement in symptoms  Patient given exercises, stretches and lifestyle modifications  See medications in patient instructions if given  Patient will follow up in 8 weeks 

## 2017-08-25 ENCOUNTER — Other Ambulatory Visit: Payer: Self-pay | Admitting: Internal Medicine

## 2017-09-23 ENCOUNTER — Ambulatory Visit (INDEPENDENT_AMBULATORY_CARE_PROVIDER_SITE_OTHER): Admitting: Adult Health

## 2017-09-23 ENCOUNTER — Encounter: Payer: Self-pay | Admitting: Adult Health

## 2017-09-23 VITALS — BP 120/72 | Temp 98.2°F | Wt 195.0 lb

## 2017-09-23 DIAGNOSIS — J069 Acute upper respiratory infection, unspecified: Secondary | ICD-10-CM | POA: Diagnosis not present

## 2017-09-23 NOTE — Progress Notes (Signed)
Subjective:    Patient ID: Michael [name], male    DOB: 03/10/1956, 62 y.o.   MRN: [id number]  URI   This is a new problem. The current episode started in the past 7 days. The problem has been gradually improving. There has been no fever. Associated symptoms include coughing (semi productive ), ear pain, headaches (resolved), rhinorrhea and sinus pain (resolved ). Pertinent negatives include no congestion, diarrhea, nausea, sneezing, sore throat or vomiting. He has tried decongestant for the symptoms. The treatment provided mild relief.      Review of Systems  Constitutional: Negative.   HENT: Positive for ear pain, rhinorrhea and sinus pain (resolved ). Negative for congestion, ear discharge, sneezing, sore throat and trouble swallowing.   Respiratory: Positive for cough (semi productive ).   Cardiovascular: Negative.   Gastrointestinal: Negative for diarrhea, nausea and vomiting.  Neurological: Positive for headaches (resolved).  All other systems reviewed and are negative.  Past Medical History:  Diagnosis Date  . Anginal pain ([place])   . CAD S/P PCI to Cx & RCA    a. s/p PCI of LCx (2006) and ostium of RCA (2007) as well as DES to RCA in [place] (05/2014)  . HTN (hypertension)   . Hyperlipidemia   . Myocardial infarction ([place])   . Obesity     Social History   Socioeconomic History  . Marital status: Married    Spouse name: Not on file  . Number of children: 4  . Years of education: Not on file  . Highest education level: Not on file  Social Needs  . Financial resource strain: Not on file  . Food insecurity - worry: Not on file  . Food insecurity - inability: Not on file  . Transportation needs - medical: Not on file  . Transportation needs - non-medical: Not on file  Occupational History  . Not on file  Tobacco Use  . Smoking status: Never Smoker  . Smokeless tobacco: Never Used  Substance and Sexual Activity  . Alcohol use: No    Alcohol/week: 0.0 oz  .  Drug use: No  . Sexual activity: Yes  Other Topics Concern  . 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 CARDIAC CATHETERIZATION N/A 05/06/2016   Procedure: Left Heart Cath and Coronary Angiography;  Surgeon: Nelva Bush, MD;  Location: [facility];  Service: Cardiovascular;  Laterality: N/A;  . CARDIAC CATHETERIZATION N/A 05/06/2016   Procedure: Coronary Stent Intervention;  Surgeon: Nelva Bush, MD;  Location: [facility];  Service: Cardiovascular;  Laterality: N/A;  . CARDIAC CATHETERIZATION N/A 05/13/2016   Procedure: Coronary/Graft Angiography;  Surgeon: Peter M Martinique, MD;  Location: [facility];  Service: Cardiovascular;  Laterality: N/A;  . CORONARY ANGIOPLASTY WITH STENT PLACEMENT  2006   in [place], occluded CFX, s/p Taxus stent  . CORONARY ANGIOPLASTY WITH STENT PLACEMENT  2007   in [place], ostial RCA stent  . CORONARY ANGIOPLASTY WITH STENT PLACEMENT  2015   in [place], RCA stent  . TONSILLECTOMY AND ADENOIDECTOMY       Family History  Problem Relation Age of Onset  . Hypertension Father   . Dementia Father        Vascular  . Heart disease Maternal Grandfather 80       Died probably of heart diseasse  .  Heart disease Maternal Uncle 77       Died of "heart exploding"    No Known Allergies  Current Outpatient Medications on File Prior to Visit  Medication Sig Dispense Refill  . aspirin 81 MG tablet Take 81 mg by mouth daily.    [name] atorvastatin (LIPITOR) 80 MG tablet TAKE 1 TABLET DAILY (PLEASE CALL OFFICE AND SCHEDULE APPOINTMENT FOR ANNUAL PHYSICAL) 90 tablet 0  . BRILINTA 90 MG TABS tablet TAKE 1 TABLET TWICE A DAY 180 tablet 2  . CHERRY PO Take 50 mg by mouth daily. [place] extract    . Cinnamon 500 MG capsule Take 500 mg by mouth 2 (two) times daily.     . isosorbide mononitrate (IMDUR) 60 MG 24 hr tablet  Take 1 tablet (60 mg total) by mouth daily. 90 tablet 3  . nitroGLYCERIN (NITROSTAT) 0.4 MG SL tablet DISSOLVE 1 TABLET UNDER THE TONGUE EVERY 5 MINUTES AS NEEDED FOR CHEST PAIN, UP TO 3 DOSES 25 tablet 3  . Prasterone, DHEA, (DHEA 50 PO) Take by mouth.    . TURMERIC PO Take 50 mg by mouth daily.    . Vitamin D, Ergocalciferol, (DRISDOL) 50000 units CAPS capsule Take 1 capsule (50,000 Units total) every 7 (seven) days by mouth. 12 capsule 0   No current facility-administered medications on file prior to visit.     BP 120/72 (BP Location: Left Arm)   Temp 98.2 F (36.8 C) (Oral)   Wt 195 lb (88.5 kg)   BMI 31.47 kg/m       Objective:   Physical Exam  Constitutional: He is oriented to person, place, and time. He appears well-developed and well-nourished. No distress.  HENT:  Right Ear: Tympanic membrane is bulging.  Left Ear: Tympanic membrane is bulging.  Nose: Nose normal. No mucosal edema or rhinorrhea. Right sinus exhibits no maxillary sinus tenderness and no frontal sinus tenderness. Left sinus exhibits no frontal sinus tenderness.  Mouth/Throat: Uvula is midline, oropharynx is clear and moist and mucous membranes are normal.  Clear fluid behind both TM's   Eyes: Conjunctivae and EOM are normal. Pupils are equal, round, and reactive to light. Right eye exhibits no discharge. Left eye exhibits no discharge.  Cardiovascular: Normal rate, regular rhythm, normal heart sounds and intact distal pulses. Exam reveals no gallop and no friction rub.  No murmur heard. Pulmonary/Chest: Effort normal and breath sounds normal. No respiratory distress. He has no wheezes. He has no rales. He exhibits no tenderness.  Neurological: He is alert and oriented to person, place, and time.  Skin: Skin is warm and dry. No rash noted. He is not diaphoretic. No erythema. No pallor.  Psychiatric: He has a normal mood and affect. His behavior is normal. Judgment and thought content normal.  Nursing note and  vitals reviewed.     Assessment & Plan:  1. Viral upper respiratory tract infection - symptoms seem to be improving. Can continue to use OTC decongestant and cough medication. Advised flonase to help with nasal congestion and fluid behind TM . - Follow up as needed  Dorothyann Peng, NP

## 2017-10-09 ENCOUNTER — Ambulatory Visit (INDEPENDENT_AMBULATORY_CARE_PROVIDER_SITE_OTHER)

## 2017-10-09 ENCOUNTER — Ambulatory Visit (INDEPENDENT_AMBULATORY_CARE_PROVIDER_SITE_OTHER): Admitting: Podiatry

## 2017-10-09 ENCOUNTER — Other Ambulatory Visit: Payer: Self-pay | Admitting: Podiatry

## 2017-10-09 DIAGNOSIS — M722 Plantar fascial fibromatosis: Secondary | ICD-10-CM | POA: Diagnosis not present

## 2017-10-09 NOTE — Patient Instructions (Signed)

## 2017-10-09 NOTE — Progress Notes (Addendum)
Subjective:    Patient ID: Michael [name], male    DOB: 1956/05/29, 62 y.o.   MRN: [id number]  HPI 62 year old male presents the office with concerns of pain to the bottom of the left heel which is been ongoing for about 2 months.  He states that his pain in the morning when he first gets up.  He denies any recent injury or trauma.  No numbness or tingling.  The pain is worse in the morning when he first gets out of bed.  He has been stretching as well as using over-the-counter inserts which helped some.  He has no other concerns today.   Review of Systems  All other systems reviewed and are negative.  Past Medical History:  Diagnosis Date  . Anginal pain ([place])   . CAD S/P PCI to Cx & RCA    a. s/p PCI of LCx (2006) and ostium of RCA (2007) as well as DES to RCA in [place] (05/2014)  . HTN (hypertension)   . Hyperlipidemia   . Myocardial infarction ([place])   . Obesity     Past Surgical History:  Procedure Laterality Date  . CARDIAC CATHETERIZATION N/A 05/06/2016   Procedure: Left Heart Cath and Coronary Angiography;  Surgeon: Nelva Bush, MD;  Location: [facility];  Service: Cardiovascular;  Laterality: N/A;  . CARDIAC CATHETERIZATION N/A 05/06/2016   Procedure: Coronary Stent Intervention;  Surgeon: Nelva Bush, MD;  Location: [facility];  Service: Cardiovascular;  Laterality: N/A;  . CARDIAC CATHETERIZATION N/A 05/13/2016   Procedure: Coronary/Graft Angiography;  Surgeon: Peter M Martinique, MD;  Location: [facility];  Service: Cardiovascular;  Laterality: N/A;  . CORONARY ANGIOPLASTY WITH STENT PLACEMENT  2006   in [place], occluded CFX, s/p Taxus stent  . CORONARY ANGIOPLASTY WITH STENT PLACEMENT  2007   in [place], ostial RCA stent  . CORONARY ANGIOPLASTY WITH STENT PLACEMENT  2015   in [place], RCA stent  . TONSILLECTOMY AND ADENOIDECTOMY        Current Outpatient Medications:  .  aspirin 81 MG tablet, Take 81 mg by mouth  daily., Disp: , Rfl:  .  atorvastatin (LIPITOR) 80 MG tablet, TAKE 1 TABLET DAILY (PLEASE CALL OFFICE AND SCHEDULE APPOINTMENT FOR ANNUAL PHYSICAL), Disp: 90 tablet, Rfl: 0 .  BRILINTA 90 MG TABS tablet, TAKE 1 TABLET TWICE A DAY, Disp: 180 tablet, Rfl: 2 .  CHERRY PO, Take 50 mg by mouth daily. Cherry extract, Disp: , Rfl:  .  Cinnamon 500 MG capsule, Take 500 mg by mouth 2 (two) times daily. , Disp: , Rfl:  .  isosorbide mononitrate (IMDUR) 60 MG 24 hr tablet, Take 1 tablet (60 mg total) by mouth daily., Disp: 90 tablet, Rfl: 3 .  nitroGLYCERIN (NITROSTAT) 0.4 MG SL tablet, DISSOLVE 1 TABLET UNDER THE TONGUE EVERY 5 MINUTES AS NEEDED FOR CHEST PAIN, UP TO 3 DOSES, Disp: 25 tablet, Rfl: 3 .  Prasterone, DHEA, (DHEA 50 PO), Take by mouth., Disp: , Rfl:  .  TURMERIC PO, Take 50 mg by mouth 2 (two) times daily. , Disp: , Rfl:  .  Vitamin D, Ergocalciferol, (DRISDOL) 50000 units CAPS capsule, Take 1 capsule (50,000 Units total) every 7 (seven) days by mouth., Disp: 12 capsule, Rfl: 0  No Known Allergies  Social History   Socioeconomic History  . Marital status: Married    Spouse name: Not on file  . Number of children: 4  . Years of education: Not on  file  . Highest education level: Not on file  Social Needs  . Financial resource strain: Not on file  . Food insecurity - worry: Not on file  . Food insecurity - inability: Not on file  . Transportation needs - medical: Not on file  . Transportation needs - non-medical: Not on file  Occupational History  . Not on file  Tobacco Use  . Smoking status: Never Smoker  . Smokeless tobacco: Never Used  Substance and Sexual Activity  . Alcohol use: No    Alcohol/week: 0.0 oz  . Drug use: No  . Sexual activity: Yes  Other Topics Concern  . 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 Nails x 10 are well manicured; remaining integument appears unremarkable at this time. There are no open sores, no preulcerative lesions, no rash or signs of infection present.  Vascular: Dorsalis Pedis artery and Posterior Tibial artery pedal pulses are 2/4 bilateral with immedate capillary fill time. There is no pain with calf compression, swelling, warmth, erythema.   Neruologic: Grossly intact via light touch bilateral.  Protective threshold with Semmes Wienstein monofilament intact to all pedal sites bilateral.  Negative Tinel sign.  Musculoskeletal: Tenderness to palpation along the plantar medial tubercle of the calcaneus at the insertion of plantar fascia on the left foot. There is no pain along the course of the plantar fascia within the arch of the foot. Plantar fascia appears to be intact. There is no pain with lateral compression of the calcaneus or pain with vibratory sensation. There is no pain along the course or insertion of the achilles tendon. No other areas of tenderness to bilateral lower extremities. Muscular strength 5/5 in all groups tested bilateral.  Gait: Unassisted, Nonantalgic.      Assessment & Plan:  Left heel pain, plantar fasciitis -Treatment options discussed including all alternatives, risks, and complications -Etiology of symptoms were discussed -X-rays were obtained and reviewed with the patient.  There is no evidence of acute fracture identified today. -Discussed a steroid injection.   -Stretching, icing exercises daily. -Discussed shoe gear modifications and continue with inserts -Plantar fascial brace -Follow-up in 3 weeks or sooner if needed.  Call any questions or concerns meantime.  Procedure: Injection Tendon/Ligament Discussed alternatives, risks, complications and verbal consent was obtained.  Location: Left plantar fascia at the glabrous junction; medial approach. Skin Prep: Alcohol. Injectate: 1 cc 0.5% marcaine plain, 1 cc  0.5% Marcaine plain and, 1 cc kenalog 10. Disposition: Patient tolerated procedure well. Injection site dressed with a band-aid.  Post-injection care was discussed and return precautions discussed.    Trula Slade DPM

## 2017-10-11 NOTE — Progress Notes (Signed)
[name] [address], [place] [address] Phone: [phone] Subjective:     CC: Back pain and muscle ache follow-up  VFI:[id number]  Michael [name] is a 62 y.o. male coming in with complaint of back pain follow-up.  Seems to be doing very well with conservative therapy including over-the-counter natural supplementations as well as osteopathic manipulation.  Patient states that he has some right hip pain over the piriformis.       Past Medical History:  Diagnosis Date  . Anginal pain ([place])   . CAD S/P PCI to Cx & RCA    a. s/p PCI of LCx (2006) and ostium of RCA (2007) as well as DES to RCA in [place] (05/2014)  . HTN (hypertension)   . Hyperlipidemia   . Myocardial infarction ([place])   . Obesity    Past Surgical History:  Procedure Laterality Date  . CARDIAC CATHETERIZATION N/A 05/06/2016   Procedure: Left Heart Cath and Coronary Angiography;  Surgeon: Nelva Bush, MD;  Location: [facility];  Service: Cardiovascular;  Laterality: N/A;  . CARDIAC CATHETERIZATION N/A 05/06/2016   Procedure: Coronary Stent Intervention;  Surgeon: Nelva Bush, MD;  Location: [facility];  Service: Cardiovascular;  Laterality: N/A;  . CARDIAC CATHETERIZATION N/A 05/13/2016   Procedure: Coronary/Graft Angiography;  Surgeon: Peter M Martinique, MD;  Location: [facility];  Service: Cardiovascular;  Laterality: N/A;  . CORONARY ANGIOPLASTY WITH STENT PLACEMENT  2006   in [place], occluded CFX, s/p Taxus stent  . CORONARY ANGIOPLASTY WITH STENT PLACEMENT  2007   in [place], ostial RCA stent  . CORONARY ANGIOPLASTY WITH STENT PLACEMENT  2015   in [place], RCA stent  . TONSILLECTOMY AND ADENOIDECTOMY      Social History   Socioeconomic History  . Marital status: Married    Spouse name: None  . Number of children: 4  . Years of education: None  . Highest education level: None  Social Needs  . Financial resource  strain: None  . Food insecurity - worry: None  . Food insecurity - inability: None  . Transportation needs - medical: None  . Transportation needs - non-medical: None  Occupational History  . None  Tobacco Use  . Smoking status: Never Smoker  . Smokeless tobacco: Never Used  Substance and Sexual Activity  . Alcohol use: No    Alcohol/week: 0.0 oz  . Drug use: No  . Sexual activity: Yes  Other Topics Concern  . 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 Hypertension Father   . Dementia Father        Vascular  . Heart disease Maternal Grandfather 18       Died probably of heart diseasse  . Heart disease Maternal Uncle 9       Died of "heart exploding"     Past medical history, social, surgical and family history all reviewed in electronic medical record.  No pertanent information unless stated regarding to the chief complaint.   Review of Systems:Review of systems updated and as accurate as of 10/12/17  No headache, visual changes, nausea, vomiting, diarrhea, constipation, dizziness, abdominal pain, skin rash, fevers, chills, night sweats, weight loss, swollen lymph nodes, body aches, joint swelling,  chest pain, shortness of breath, mood changes.  Positive muscle aches  Objective  Blood pressure 130/80, pulse 77, height 5\' 6"  (1.676 m), weight 196 lb (88.9 kg), SpO2 98 %. Systems examined below as of 10/12/17   General: No apparent distress alert and oriented x3 mood and affect normal, dressed appropriately.  HEENT: Pupils equal, extraocular movements intact vitiligo around the mouth Respiratory: Patient's speak in full sentences and does not appear short of breath  Cardiovascular: Trace lower extremity edema, non tender, no erythema  Skin: Warm dry intact with no signs of infection or rash on extremities or on axial skeleton.  Abdomen: Soft  nontender  Neuro: Cranial nerves II through XII are intact, neurovascularly intact in all extremities with 2+ DTRs and 2+ pulses.  Lymph: No lymphadenopathy of posterior or anterior cervical chain or axillae bilaterally.  Gait normal with good balance and coordination.  MSK:  Non tender with full range of motion and good stability and symmetric strength and tone of shoulders, elbows, wrist, hip, knee and ankles bilaterally.  Mild arthritic changes of multiple joints  Back Exam:  Inspection: Unremarkable  Motion: Flexion 35 deg, Extension 25 deg, Side Bending to 35 deg bilaterally,  Rotation to 45 deg bilaterally  SLR laying: Negative  XSLR laying: Negative  Palpable tenderness: Tender to palpation around the sacroiliac joints bilaterally. FABER: Positive right. Sensory change: Gross sensation intact to all lumbar and sacral dermatomes.  Reflexes: 2+ at both patellar tendons, 2+ at achilles tendons, Babinski's downgoing.  Strength at foot  Plantar-flexion: 5/5 Dorsi-flexion: 5/5 Eversion: 5/5 Inversion: 5/5  Leg strength  Quad: 5/5 Hamstring: 5/5 Hip flexor: 5/5 Hip abductors: 5/5  Gait unremarkable.  Osteopathic findings C3 flexed rotated and side bent right T3 extended rotated and side bent right inhaled third rib T6 extended rotated and side bent left L3 flexed rotated and side bent right Sacrum right on right     Impression and Recommendations:     This case required medical decision making of moderate complexity.      Note: This dictation was prepared with Dragon dictation along with smaller phrase technology. Any transcriptional errors that result from this process are unintentional.

## 2017-10-12 ENCOUNTER — Ambulatory Visit (INDEPENDENT_AMBULATORY_CARE_PROVIDER_SITE_OTHER): Admitting: Family Medicine

## 2017-10-12 ENCOUNTER — Encounter: Payer: Self-pay | Admitting: Family Medicine

## 2017-10-12 VITALS — BP 130/80 | HR 77 | Ht 66.0 in | Wt 196.0 lb

## 2017-10-12 DIAGNOSIS — G8929 Other chronic pain: Secondary | ICD-10-CM | POA: Diagnosis not present

## 2017-10-12 DIAGNOSIS — M999 Biomechanical lesion, unspecified: Secondary | ICD-10-CM | POA: Diagnosis not present

## 2017-10-12 DIAGNOSIS — M722 Plantar fascial fibromatosis: Secondary | ICD-10-CM | POA: Insufficient documentation

## 2017-10-12 DIAGNOSIS — M549 Dorsalgia, unspecified: Secondary | ICD-10-CM

## 2017-10-12 HISTORY — DX: Plantar fascial fibromatosis: M72.2

## 2017-10-12 NOTE — Assessment & Plan Note (Addendum)
Decision today to treat with OMT was based on Physical Exam  After verbal consent patient was treated with HVLA, ME, FPR techniques in  thoracic, lumbar and sacral areas  Patient tolerated the procedure well with improvement in symptoms  Patient given exercises, stretches and lifestyle modifications  See medications in patient instructions if given  Patient will follow up in 4-6 weeks 

## 2017-10-12 NOTE — Assessment & Plan Note (Signed)
Patient is doing relatively well.  Does have some degenerative disc disease.  Patient has been doing icing regimen.  Continue the vitamin D.  Follow-up again in 4-6 weeks.

## 2017-10-13 ENCOUNTER — Telehealth: Payer: Self-pay | Admitting: *Deleted

## 2017-10-13 NOTE — Telephone Encounter (Signed)
Called and spoke with patient and the patient stated that the heel was still sore and that the brace and injection did help and was not in a lot of discomfort at this time and I stated to the patient if any concerns or questions to call the [place] office at [phone]. Michael [name]

## 2017-11-06 ENCOUNTER — Ambulatory Visit: Admitting: Podiatry

## 2017-11-09 ENCOUNTER — Ambulatory Visit (INDEPENDENT_AMBULATORY_CARE_PROVIDER_SITE_OTHER): Admitting: Podiatry

## 2017-11-09 ENCOUNTER — Encounter: Payer: Self-pay | Admitting: Podiatry

## 2017-11-09 DIAGNOSIS — M722 Plantar fascial fibromatosis: Secondary | ICD-10-CM | POA: Diagnosis not present

## 2017-11-10 ENCOUNTER — Encounter: Payer: Self-pay | Admitting: *Deleted

## 2017-11-11 ENCOUNTER — Other Ambulatory Visit: Payer: Self-pay | Admitting: Family Medicine

## 2017-11-11 NOTE — Progress Notes (Signed)
Subjective: Mr. Anna presents to the office today for follow-up evaluation of left heel pain.  He says the foot is feeling much better but he still getting some pain to the bottom of the heel.  The brace has been helping as well as the injection.  He has had no recent injury or trauma no changes since last appointment.  He has been stretching intermittently. Denies any systemic complaints such as fevers, chills, nausea, vomiting. No acute changes since last appointment, and no other complaints at this time.   Objective: AAO x3, NAD DP/PT pulses palpable bilaterally, CRT less than 3 seconds There is still mild tenderness to palpation along the plantar medial tubercle of the calcaneus at the insertion of plantar fascia on the left foot.  Pain to the more centralized in the heel.  Mild discomfort to the lateral vein of the plantar fascia.  There is no pain along the course of the plantar fascia within the arch of the foot. Plantar fascia appears to be intact. There is no pain with lateral compression of the calcaneus or pain with vibratory sensation. There is no pain along the course or insertion of the achilles tendon. No other areas of tenderness to bilateral lower extremities. Negative Tinel sign No open lesions or pre-ulcerative lesions.  No pain with calf compression, swelling, warmth, erythema  Assessment: Left heel pain, plantar fasciitis  Plan: -All treatment options discussed with the patient including all alternatives, risks, complications.  -Discussed that her second steroid injection wished to proceed.  See procedure note below. -Continue stretching, icing exercises daily -Continue with supportive shoes as discussed inserts  -Procedure: Injection Tendon/Ligament Discussed alternatives, risks, complications and verbal consent was obtained.  Location: Left plantar fascia at the glabrous junction; medial approach. Skin Prep: Alcohol. Injectate: 0.5 cc 0.5% marcaine plain, 0.5 cc  0.5% Marcaine plain and, 1 cc kenalog 10. Disposition: Patient tolerated procedure well. Injection site dressed with a band-aid.  Post-injection care was discussed and return precautions discussed.   *Discussed lateral portion of the appointment if symptoms continue  -Patient encouraged to call the office with any questions, concerns, change in symptoms.   Trula Slade DPM

## 2017-11-15 NOTE — Progress Notes (Signed)
HPI The patient presents for evaluation of coronary disease. He moved from [place]. A cath done in [place] before he moved demonstrated a right coronary lesion that was treated with a drug-eluting stent.  In Sept 2016 he was admitted with unstable angina.  A myoview was low risk. He then presented 05/05/16 again with angina. Cath revealed patent stents in the RCA and CFX with disease in his mid LAD. He received two DES. He was discharged then re admitted with similar chest pain and Troponin elevation 05/13/16. Re look cath showed patent LAD stents, CFX stent, and RCA stent. There was a 70% small Dx2 branch narrowing. There was no loss of side branches noted. It was suspected he had spasm. Nitrates were added.  He was in the ED in August with chest pain and he was managed medically.   He presents for follow up.    Since I last saw him he has done well.  He is active walking.  The patient denies any new symptoms such as chest discomfort, neck or arm discomfort. There has been no new shortness of breath, PND or orthopnea. There have been no reported palpitations, presyncope or syncope.   Current Outpatient Medications  Medication Sig Dispense Refill  . aspirin 81 MG tablet Take 81 mg by mouth daily.    [name] atorvastatin (LIPITOR) 80 MG tablet TAKE 1 TABLET DAILY (PLEASE CALL OFFICE AND SCHEDULE APPOINTMENT FOR ANNUAL PHYSICAL) 90 tablet 0  . BRILINTA 90 MG TABS tablet TAKE 1 TABLET TWICE A DAY 180 tablet 2  . CHERRY PO Take 50 mg by mouth daily. [place] extract    . Cinnamon 500 MG capsule Take 500 mg by mouth 2 (two) times daily.     . isosorbide mononitrate (IMDUR) 60 MG 24 hr tablet Take 1 tablet (60 mg total) by mouth daily. 90 tablet 3  . nitroGLYCERIN (NITROSTAT) 0.4 MG SL tablet DISSOLVE 1 TABLET UNDER THE TONGUE EVERY 5 MINUTES AS NEEDED FOR CHEST PAIN, UP TO 3 DOSES 25 tablet 3  . Prasterone, DHEA, (DHEA 50 PO) Take by mouth.    . TURMERIC PO Take 50 mg by mouth 2 (two) times daily.     .  Vitamin D, Ergocalciferol, (DRISDOL) 50000 units CAPS capsule Take 1 capsule (50,000 Units total) every 7 (seven) days by mouth. 12 capsule 0  . Vitamin D, Ergocalciferol, (DRISDOL) 50000 units CAPS capsule TAKE 1 CAPSULE EVERY 7 DAYS 12 capsule 0   No current facility-administered medications for this visit.     Past Medical History:  Diagnosis Date  . Anginal pain ([place])   . CAD S/P PCI to Cx & RCA    a. s/p PCI of LCx (2006) and ostium of RCA (2007) as well as DES to RCA in [place] (05/2014)  . HTN (hypertension)   . Hyperlipidemia   . Myocardial infarction ([place])   . Obesity     Past Surgical History:  Procedure Laterality Date  . CARDIAC CATHETERIZATION N/A 05/06/2016   Procedure: Left Heart Cath and Coronary Angiography;  Surgeon: Nelva Bush, MD;  Location: [facility];  Service: Cardiovascular;  Laterality: N/A;  . CARDIAC CATHETERIZATION N/A 05/06/2016   Procedure: Coronary Stent Intervention;  Surgeon: Nelva Bush, MD;  Location: [facility];  Service: Cardiovascular;  Laterality: N/A;  . CARDIAC CATHETERIZATION N/A 05/13/2016   Procedure: Coronary/Graft Angiography;  Surgeon: Peter M Martinique, MD;  Location: [facility];  Service: Cardiovascular;  Laterality: N/A;  .  CORONARY ANGIOPLASTY WITH STENT PLACEMENT  2006   in [place], occluded CFX, s/p Taxus stent  . CORONARY ANGIOPLASTY WITH STENT PLACEMENT  2007   in [place], ostial RCA stent  . CORONARY ANGIOPLASTY WITH STENT PLACEMENT  2015   in [place], RCA stent  . TONSILLECTOMY AND ADENOIDECTOMY       ROS:    As stated in the HPI and negative for all other systems.  PHYSICAL EXAM BP 110/60   Pulse 79   Ht 5\' 6"  (1.676 m)   Wt 196 lb (88.9 kg)   BMI 31.64 kg/m   GENERAL:  Well appearing NECK:  No jugular venous distention, waveform within normal limits, carotid upstroke brisk and symmetric, no bruits, no thyromegaly LUNGS:  Clear to auscultation bilaterally CHEST:   Unremarkable HEART:  PMI not displaced or sustained,S1 and S2 within normal limits, no S3, no S4, no clicks, no rubs, no murmurs ABD:  Flat, positive bowel sounds normal in frequency in pitch, no bruits, no rebound, no guarding, no midline pulsatile mass, no hepatomegaly, no splenomegaly EXT:  2 plus pulses throughout, no edema, no cyanosis no clubbing   EKG:  NA  Lab Results  Component Value Date   CHOL 103 05/11/2016   TRIG 76 05/11/2016   HDL 41 05/11/2016   LDLCALC 47 05/11/2016     ASSESSMENT AND PLAN   CAD:  The patient has no new sypmtoms.  No further cardiovascular testing is indicated.  We will continue with aggressive risk reduction and meds as listed.  Of note has had multiple stents in all of his coronaries  He has done well on the current meds.  We have had the risk benefit discussion and our agreed upon plan is to continue DAPT.   DYSLIPIDEMIA:   He will get an LDL when he sees Marletta Lor, MD

## 2017-11-18 ENCOUNTER — Ambulatory Visit (INDEPENDENT_AMBULATORY_CARE_PROVIDER_SITE_OTHER): Admitting: Cardiology

## 2017-11-18 ENCOUNTER — Encounter: Payer: Self-pay | Admitting: Cardiology

## 2017-11-18 VITALS — BP 110/60 | HR 79 | Ht 66.0 in | Wt 196.0 lb

## 2017-11-18 DIAGNOSIS — I2510 Atherosclerotic heart disease of native coronary artery without angina pectoris: Secondary | ICD-10-CM

## 2017-11-18 DIAGNOSIS — E785 Hyperlipidemia, unspecified: Secondary | ICD-10-CM

## 2017-11-18 NOTE — Patient Instructions (Signed)
Medication Instructions:  Continue current medications  If you need a refill on your cardiac medications before your next appointment, please call your pharmacy.  Labwork: None Ordered Get blood work including cholesterol check at PCP office  Testing/Procedures: None Ordered   Follow-Up: Your physician wants you to follow-up in: 1 Year. You should receive a reminder letter in the mail two months in advance. If you do not receive a letter, please call our office [phone].    Thank you for choosing [facility]!!

## 2017-12-05 ENCOUNTER — Other Ambulatory Visit: Payer: Self-pay | Admitting: Cardiology

## 2017-12-05 ENCOUNTER — Encounter (INDEPENDENT_AMBULATORY_CARE_PROVIDER_SITE_OTHER): Payer: Self-pay

## 2017-12-05 ENCOUNTER — Other Ambulatory Visit: Payer: Self-pay | Admitting: Internal Medicine

## 2017-12-06 NOTE — Progress Notes (Signed)
[name] [place], [place] [address] Phone: [phone] Subjective:    I'm seeing this patient by the request  of:    CC: Back pain follow-up  RWE:[id number]  Michael [name] is a 62 y.o. male coming in with complaint of back pain.  Has been doing remarkably well with conservative therapy.  Patient at last exam was having more pain over the right piriformis.  Patient states that his back is doing better. His left foot is bothering him and he wonders if he has been overcompensating. His right hip/lumbar spine is irritated but he has not been walking as much as he usually does. He does the exercises. Patient was cutting trees this past weekend.     Past Medical History:  Diagnosis Date  . Anginal pain ([place])   . CAD S/P PCI to Cx & RCA    a. s/p PCI of LCx (2006) and ostium of RCA (2007) as well as DES to RCA in [place] (05/2014)  . HTN (hypertension)   . Hyperlipidemia   . Myocardial infarction ([place])   . Obesity    Past Surgical History:  Procedure Laterality Date  . CARDIAC CATHETERIZATION N/A 05/06/2016   Procedure: Left Heart Cath and Coronary Angiography;  Surgeon: Nelva Bush, MD;  Location: [facility];  Service: Cardiovascular;  Laterality: N/A;  . CARDIAC CATHETERIZATION N/A 05/06/2016   Procedure: Coronary Stent Intervention;  Surgeon: Nelva Bush, MD;  Location: [facility];  Service: Cardiovascular;  Laterality: N/A;  . CARDIAC CATHETERIZATION N/A 05/13/2016   Procedure: Coronary/Graft Angiography;  Surgeon: Peter M Martinique, MD;  Location: [facility];  Service: Cardiovascular;  Laterality: N/A;  . CORONARY ANGIOPLASTY WITH STENT PLACEMENT  2006   in [place], occluded CFX, s/p Taxus stent  . CORONARY ANGIOPLASTY WITH STENT PLACEMENT  2007   in [place], ostial RCA stent  . CORONARY ANGIOPLASTY WITH STENT PLACEMENT  2015   in [place], RCA stent  . TONSILLECTOMY AND ADENOIDECTOMY        Social History   Socioeconomic History  . Marital status: Married    Spouse name: None  . Number of children: 4  . Years of education: None  . Highest education level: None  Social Needs  . Financial resource strain: None  . Food insecurity - worry: None  . Food insecurity - inability: None  . Transportation needs - medical: None  . Transportation needs - non-medical: None  Occupational History  . None  Tobacco Use  . Smoking status: Never Smoker  . Smokeless tobacco: Never Used  Substance and Sexual Activity  . Alcohol use: No    Alcohol/week: 0.0 oz  . Drug use: No  . Sexual activity: Yes  Other Topics Concern  . 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 Hypertension Father   . Dementia Father        Vascular  . Cancer Brother   . Heart disease Maternal Grandfather 58       Died probably of heart diseasse  . Heart disease Maternal Uncle 22       Died of "heart exploding"     Past medical history, social, surgical and family history all reviewed in electronic medical record.  No pertanent information unless stated regarding to the chief complaint.  Review of Systems:Review of systems updated and as accurate as of 12/07/17  No headache, visual changes, nausea, vomiting, diarrhea, constipation, dizziness, abdominal pain, skin rash, fevers, chills, night sweats, weight loss, swollen lymph nodes, body aches, joint swelling, chest pain, shortness of breath, mood changes.  Positive muscle aches  Objective  Blood pressure 110/64, pulse 78, height 5\' 6"  (1.676 m), weight 198 lb (89.8 kg), SpO2 98 %. Systems examined below as of 12/07/17   General: No apparent distress alert and oriented x3 mood and affect normal, dressed appropriately.  HEENT: Pupils equal, extraocular movements intact  Respiratory: Patient's speak in full sentences and does  not appear short of breath  Cardiovascular: No lower extremity edema, non tender, no erythema  Skin: Warm dry intact with no signs of infection or rash on extremities or on axial skeleton.  Vitiligo noted Abdomen: Soft nontender  Neuro: Cranial nerves II through XII are intact, neurovascularly intact in all extremities with 2+ DTRs and 2+ pulses.  Lymph: No lymphadenopathy of posterior or anterior cervical chain or axillae bilaterally.  Gait normal with good balance and coordination.  MSK:  Non tender with full range of motion and good stability and symmetric strength and tone of shoulders, elbows, wrist, hip, knee and ankles bilaterally.  Back Exam:  Inspection: Unremarkable  Motion: Flexion 35 deg, Extension 15 deg, Side Bending to 45 deg bilaterally,  Rotation to 45 deg bilaterally  SLR laying: Negative  XSLR laying: Negative  Palpable tenderness: Tenderness to palpation of the right sacroiliac joint. FABER: Positive right. Sensory change: Gross sensation intact to all lumbar and sacral dermatomes.  Reflexes: 2+ at both patellar tendons, 2+ at achilles tendons, Babinski's downgoing.  Strength at foot  Plantar-flexion: 5/5 Dorsi-flexion: 5/5 Eversion: 5/5 Inversion: 5/5  Leg strength  Quad: 5/5 Hamstring: 5/5 Hip flexor: 5/5 Hip abductors: 4/5  Gait unremarkable.  Osteopathic findings T4 extended rotated and side bent left L4 flexed rotated and side bent right Sacrum right on right    Impression and Recommendations:     This case required medical decision making of moderate complexity.      Note: This dictation was prepared with Dragon dictation along with smaller phrase technology. Any transcriptional errors that result from this process are unintentional.

## 2017-12-07 ENCOUNTER — Ambulatory Visit (INDEPENDENT_AMBULATORY_CARE_PROVIDER_SITE_OTHER): Admitting: Family Medicine

## 2017-12-07 ENCOUNTER — Encounter: Payer: Self-pay | Admitting: Family Medicine

## 2017-12-07 VITALS — BP 110/64 | HR 78 | Ht 66.0 in | Wt 198.0 lb

## 2017-12-07 DIAGNOSIS — G8929 Other chronic pain: Secondary | ICD-10-CM | POA: Diagnosis not present

## 2017-12-07 DIAGNOSIS — M549 Dorsalgia, unspecified: Secondary | ICD-10-CM | POA: Diagnosis not present

## 2017-12-07 DIAGNOSIS — M999 Biomechanical lesion, unspecified: Secondary | ICD-10-CM | POA: Diagnosis not present

## 2017-12-07 NOTE — Assessment & Plan Note (Addendum)
Decision today to treat with OMT was based on Physical Exam  After verbal consent patient was treated with HVLA, ME, FPR techniques in  thoracic, lumbar and sacral areas  Patient tolerated the procedure well with improvement in symptoms  Patient given exercises, stretches and lifestyle modifications  See medications in patient instructions if given  Patient will follow up in 8 weeks 

## 2017-12-07 NOTE — Telephone Encounter (Signed)
REFILL 

## 2017-12-07 NOTE — Patient Instructions (Signed)
Good to see you  You will do great  Exercises on wall.  Heel and butt touching.  Raise leg 6 inches and hold 2 seconds.  Down slow for count of 4 seconds.  1 set of 30 reps daily on both sides.  See me again in 6-8 weeks

## 2017-12-07 NOTE — Assessment & Plan Note (Signed)
Patient does have some mild degenerative changes.  Has responded well to manipulation.  Seems to be more of the sacroiliac joint.  Discussed icing regimen and home exercises.  Avoid repetitive flexion or extension of the back.  Discussed more isometrics.  Follow-up again in 2-3 months

## 2017-12-08 ENCOUNTER — Ambulatory Visit (INDEPENDENT_AMBULATORY_CARE_PROVIDER_SITE_OTHER): Admitting: Internal Medicine

## 2017-12-08 ENCOUNTER — Encounter: Payer: Self-pay | Admitting: Internal Medicine

## 2017-12-08 VITALS — BP 108/62 | HR 58 | Temp 97.6°F | Ht 67.75 in | Wt 193.0 lb

## 2017-12-08 DIAGNOSIS — Z23 Encounter for immunization: Secondary | ICD-10-CM

## 2017-12-08 DIAGNOSIS — Z0000 Encounter for general adult medical examination without abnormal findings: Secondary | ICD-10-CM | POA: Diagnosis not present

## 2017-12-08 DIAGNOSIS — Z125 Encounter for screening for malignant neoplasm of prostate: Secondary | ICD-10-CM

## 2017-12-08 LAB — CBC WITH DIFFERENTIAL/PLATELET
Basophils Absolute: 0 10*3/uL (ref 0.0–0.1)
Basophils Relative: 0.6 % (ref 0.0–3.0)
Eosinophils Absolute: 0.1 10*3/uL (ref 0.0–0.7)
Eosinophils Relative: 1.6 % (ref 0.0–5.0)
HCT: 42.5 % (ref 39.0–52.0)
Hemoglobin: 15 g/dL (ref 13.0–17.0)
Lymphocytes Relative: 19.6 % (ref 12.0–46.0)
Lymphs Abs: 1.1 10*3/uL (ref 0.7–4.0)
MCHC: 35.2 g/dL (ref 30.0–36.0)
MCV: 93.2 fl (ref 78.0–100.0)
Monocytes Absolute: 0.5 10*3/uL (ref 0.1–1.0)
Monocytes Relative: 7.9 % (ref 3.0–12.0)
Neutro Abs: 4.1 10*3/uL (ref 1.4–7.7)
Neutrophils Relative %: 70.3 % (ref 43.0–77.0)
Platelets: 226 10*3/uL (ref 150.0–400.0)
RBC: 4.56 Mil/uL (ref 4.22–5.81)
RDW: 14.1 % (ref 11.5–15.5)
WBC: 5.8 10*3/uL (ref 4.0–10.5)

## 2017-12-08 LAB — HEMOGLOBIN A1C: Hgb A1c MFr Bld: 6 % (ref 4.6–6.5)

## 2017-12-08 LAB — COMPREHENSIVE METABOLIC PANEL
ALT: 40 U/L (ref 0–53)
AST: 30 U/L (ref 0–37)
Albumin: 4.3 g/dL (ref 3.5–5.2)
Alkaline Phosphatase: 73 U/L (ref 39–117)
BUN: 19 mg/dL (ref 6–23)
CO2: 32 mEq/L (ref 19–32)
Calcium: 9.8 mg/dL (ref 8.4–10.5)
Chloride: 101 mEq/L (ref 96–112)
Creatinine, Ser: 0.95 mg/dL (ref 0.40–1.50)
GFR: 85.18 mL/min (ref >60.00)
Glucose, Bld: 94 mg/dL (ref 70–99)
Potassium: 4.4 mEq/L (ref 3.5–5.1)
Sodium: 139 mEq/L (ref 135–145)
Total Bilirubin: 1.9 mg/dL — ABNORMAL HIGH (ref 0.2–1.2)
Total Protein: 6.6 g/dL (ref 6.0–8.3)

## 2017-12-08 LAB — PSA: PSA: 1.53 ng/mL (ref 0.10–4.00)

## 2017-12-08 LAB — LIPID PANEL
Cholesterol: 101 mg/dL (ref 0–200)
HDL: 48.9 mg/dL (ref >39.00)
LDL Cholesterol: 34 mg/dL (ref 0–99)
NonHDL: 51.97
Total CHOL/HDL Ratio: 2
Triglycerides: 89 mg/dL (ref 0.0–149.0)
VLDL: 17.8 mg/dL (ref 0.0–40.0)

## 2017-12-08 LAB — TSH: TSH: 2.01 u[IU]/mL (ref 0.35–4.50)

## 2017-12-08 NOTE — Patient Instructions (Signed)
Schedule your colonoscopy to help detect colon cancer.  Limit your sodium (Salt) intake    It is important that you exercise regularly, at least 20 minutes 3 to 4 times per week.  If you develop chest pain or shortness of breath seek  medical attention.  Cardiology follow-up as scheduled  Return in one year for follow-up

## 2017-12-08 NOTE — Progress Notes (Signed)
Subjective:    Patient ID: Michael [name], male    DOB: 30-Dec-1955, 62 y.o.   MRN: [id number]  HPI  62 year old patient who is seen today for a preventive health examination. He has a history of coronary artery disease and is followed closely by cardiology.  He has had a number of interventions over the years.  He remains on high intensity statin therapy.  Clinically doing well  He has been seen by sports medicine recently  No prior screening colonoscopies  Family history positive for coronary artery disease in his extended family father died at 45 from complications of senile dementia.  Mother died in her 39s one sister has dyslipidemia and otherwise well  Past Medical History:  Diagnosis Date  . Anginal pain ([place])   . CAD S/P PCI to Cx & RCA    a. s/p PCI of LCx (2006) and ostium of RCA (2007) as well as DES to RCA in [place] (05/2014)  . HTN (hypertension)   . Hyperlipidemia   . Myocardial infarction ([place])   . Obesity      Social History   Socioeconomic History  . Marital status: Married    Spouse name: Not on file  . Number of children: 4  . Years of education: Not on file  . Highest education level: Not on file  Social Needs  . Financial resource strain: Not on file  . Food insecurity - worry: Not on file  . Food insecurity - inability: Not on file  . Transportation needs - medical: Not on file  . Transportation needs - non-medical: Not on file  Occupational History  . Not on file  Tobacco Use  . Smoking status: Never Smoker  . Smokeless tobacco: Never Used  Substance and Sexual Activity  . Alcohol use: No    Alcohol/week: 0.0 oz  . Drug use: No  . Sexual activity: Yes  Other Topics Concern  . 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 CARDIAC CATHETERIZATION N/A 05/06/2016   Procedure: Left Heart Cath and Coronary Angiography;   Surgeon: Nelva Bush, MD;  Location: [facility];  Service: Cardiovascular;  Laterality: N/A;  . CARDIAC CATHETERIZATION N/A 05/06/2016   Procedure: Coronary Stent Intervention;  Surgeon: Nelva Bush, MD;  Location: [facility];  Service: Cardiovascular;  Laterality: N/A;  . CARDIAC CATHETERIZATION N/A 05/13/2016   Procedure: Coronary/Graft Angiography;  Surgeon: Deysy Schabel M Martinique, MD;  Location: [facility];  Service: Cardiovascular;  Laterality: N/A;  . CORONARY ANGIOPLASTY WITH STENT PLACEMENT  2006   in [place], occluded CFX, s/p Taxus stent  . CORONARY ANGIOPLASTY WITH STENT PLACEMENT  2007   in [place], ostial RCA stent  . CORONARY ANGIOPLASTY WITH STENT PLACEMENT  2015   in [place], RCA stent  . TONSILLECTOMY AND ADENOIDECTOMY       Family History  Problem Relation Age of Onset  . Hypertension Father   . Dementia Father        Vascular  . Cancer Brother   . Heart disease Maternal Grandfather 84       Died probably of heart diseasse  . Heart disease Maternal Uncle 72       Died of "heart exploding"    No Known Allergies  Current Outpatient Medications on File Prior to Visit  Medication Sig Dispense Refill  .  aspirin 81 MG tablet Take 81 mg by mouth daily.    [name] atorvastatin (LIPITOR) 80 MG tablet TAKE 1 TABLET DAILY (PLEASE CALL OFFICE AND SCHEDULE APPOINTMENT FOR ANNUAL PHYSICAL) 90 tablet 0  . BRILINTA 90 MG TABS tablet TAKE 1 TABLET TWICE A DAY 180 tablet 2  . CHERRY PO Take 50 mg by mouth daily. [place] extract    . Cinnamon 500 MG capsule Take 500 mg by mouth 2 (two) times daily.     . isosorbide mononitrate (IMDUR) 60 MG 24 hr tablet TAKE 1 TABLET DAILY 90 tablet 3  . nitroGLYCERIN (NITROSTAT) 0.4 MG SL tablet DISSOLVE 1 TABLET UNDER THE TONGUE EVERY 5 MINUTES AS NEEDED FOR CHEST PAIN, UP TO 3 DOSES 25 tablet 3  . Prasterone, DHEA, (DHEA 50 PO) Take by mouth.    . TURMERIC PO Take 50 mg by mouth 2 (two) times daily.     . Vitamin  D, Ergocalciferol, (DRISDOL) 50000 units CAPS capsule Take 1 capsule (50,000 Units total) every 7 (seven) days by mouth. 12 capsule 0  . Vitamin D, Ergocalciferol, (DRISDOL) 50000 units CAPS capsule TAKE 1 CAPSULE EVERY 7 DAYS 12 capsule 0   No current facility-administered medications on file prior to visit.     BP 108/62 (BP Location: Right Arm, Patient Position: Sitting, Cuff Size: Large)   Pulse (!) 58   Temp 97.6 F (36.4 C) (Oral)   Ht 5' 7.75" (1.721 m)   Wt 193 lb (87.5 kg)   SpO2 99%   BMI 29.56 kg/m     Review of Systems  Constitutional: Negative for appetite change, chills, fatigue and fever.  HENT: Negative for congestion, dental problem, ear pain, hearing loss, sore throat, tinnitus, trouble swallowing and voice change.   Eyes: Negative for pain, discharge and visual disturbance.  Respiratory: Negative for cough, chest tightness, wheezing and stridor.   Cardiovascular: Negative for chest pain, palpitations and leg swelling.  Gastrointestinal: Negative for abdominal distention, abdominal pain, blood in stool, constipation, diarrhea, nausea and vomiting.  Genitourinary: Negative for difficulty urinating, discharge, flank pain, genital sores, hematuria and urgency.  Musculoskeletal: Negative for arthralgias, back pain, gait problem, joint swelling, myalgias and neck stiffness.  Skin: Negative for rash.  Neurological: Negative for dizziness, syncope, speech difficulty, weakness, numbness and headaches.  Hematological: Negative for adenopathy. Does not bruise/bleed easily.  Psychiatric/Behavioral: Negative for behavioral problems and dysphoric mood. The patient is not nervous/anxious.        Objective:   Physical Exam  Constitutional: He appears well-developed and well-nourished.  HENT:  Head: Normocephalic and atraumatic.  Right Ear: External ear normal.  Left Ear: External ear normal.  Nose: Nose normal.  Mouth/Throat: Oropharynx is clear and moist.  Eyes:  Conjunctivae and EOM are normal. Pupils are equal, round, and reactive to light. No scleral icterus.  Neck: Normal range of motion. Neck supple. No JVD present. No thyromegaly present.  Cardiovascular: Regular rhythm, normal heart sounds and intact distal pulses. Exam reveals no gallop and no friction rub.  No murmur heard. Dorsalis pedis pulses faint.  Posterior tibial pulses full  Pulmonary/Chest: Effort normal and breath sounds normal. He exhibits no tenderness.  Abdominal: Soft. Bowel sounds are normal. He exhibits no distension and no mass. There is no tenderness.  Genitourinary: Penis normal. Rectal exam shows guaiac negative stool.  Genitourinary Comments:  prostate moderately symmetrically enlarged.  Stool heme-negative  Musculoskeletal: Normal range of motion. He exhibits no edema or tenderness.  Lymphadenopathy:    He  has no cervical adenopathy.  Neurological: He is alert. He has normal reflexes. No cranial nerve deficit. Coordination normal.  Skin: Skin is warm and dry. No rash noted.  Psychiatric: He has a normal mood and affect. His behavior is normal.          Assessment & Plan:  Preventive health examination Tdap vaccine administered We will schedule initial screening colonoscopy  Coronary artery disease continue present regimen which includes high intensity statin therapy.  Will review a lipid profile  BPH mildly symptomatic History of impaired glucose tolerance.  Will review a hemoglobin A1c   cardiology follow-up as scheduled Return here 1 year or as needed  Nyoka Cowden

## 2017-12-09 LAB — HEPATITIS C ANTIBODY
Hepatitis C Ab: NONREACTIVE
SIGNAL TO CUT-OFF: 0.02 (ref <1.00)

## 2017-12-15 ENCOUNTER — Encounter: Payer: Self-pay | Admitting: Physician Assistant

## 2018-01-05 ENCOUNTER — Ambulatory Visit: Payer: Self-pay | Admitting: *Deleted

## 2018-01-05 ENCOUNTER — Encounter: Payer: Self-pay | Admitting: Physician Assistant

## 2018-01-05 ENCOUNTER — Telehealth: Payer: Self-pay | Admitting: *Deleted

## 2018-01-05 ENCOUNTER — Ambulatory Visit (INDEPENDENT_AMBULATORY_CARE_PROVIDER_SITE_OTHER): Admitting: Physician Assistant

## 2018-01-05 VITALS — BP 124/70 | HR 80 | Ht 67.0 in | Wt 198.5 lb

## 2018-01-05 DIAGNOSIS — Z7901 Long term (current) use of anticoagulants: Secondary | ICD-10-CM | POA: Diagnosis not present

## 2018-01-05 DIAGNOSIS — Z1211 Encounter for screening for malignant neoplasm of colon: Secondary | ICD-10-CM

## 2018-01-05 MED ORDER — NA SULFATE-K SULFATE-MG SULF 17.5-3.13-1.6 GM/177ML PO SOLN: ORAL | 0 refills | Status: DC

## 2018-01-05 NOTE — Patient Instructions (Addendum)
IIf you are age 63 or younger, your body mass index should be between 19-25. Your Body mass index is 31.09 kg/m. If this is out of the aformentioned range listed, please consider follow up with your Primary Care Provider.   You have been scheduled for a colonoscopy. Please follow written instructions given to you at your visit today.  Please pick up your prep supplies at the pharmacy within the next 1-3 days. [place].  If you use inhalers (even only as needed), please bring them with you on the day of your procedure. Your physician has requested that you go to www.startemmi.com and enter the access code given to you at your visit today. This web site gives a general overview about your procedure. However, you should still follow specific instructions given to you by our office regarding your preparation for the procedure.  Stay on Aspirin.

## 2018-01-05 NOTE — Progress Notes (Signed)
[facility] Pre-operative Risk Assessment     Request for surgical clearance:     Endoscopy Procedure  What type of surgery is being performed?     Colonoscopy  When is this surgery scheduled?     01-20-2018  What type of clearance is required ?   Pharmacy  Are there any medications that need to be held prior to surgery and how long? [place] name and name of physician performing surgery?      [facility]  What is your office phone and fax number?      Phone- [phone]  [phone]  Anesthesia type (None, local, MAC, general) ?       MAC

## 2018-01-05 NOTE — Telephone Encounter (Signed)
   Patient needs a colonoscopy and is requested to hold the Brilinta prior to the procedure.  He has had multiple stents in multiple vessels.  Dr. Percival Spanish will need to make the decision about whether it is okay for him to hold his Brilinta for the procedure.  He was told that it was okay for him to continue the aspirin.  Spoke with him by phone and he is doing well otherwise from a cardiac standpoint.  Will route this to Dr. Percival Spanish to address the Brilinta.  Rosaria Ferries, PA-C 01/05/2018 5:39 PM Beeper [phone]

## 2018-01-05 NOTE — Telephone Encounter (Signed)
Tonette Bihari, CMA at 01/05/2018 9:57 AM   Status: Signed    [facility] Pre-operative Risk Assessment    Request for surgical clearance:     Endoscopy Procedure  What type of surgery is being performed?     Colonoscopy  When is this surgery scheduled?     01-20-2018  What type of clearance is required ?   Pharmacy  Are there any medications that need to be held prior to surgery and how long? [place] name and name of physician performing surgery?      [facility]  What is your office phone and fax number?      Phone- [phone]  [phone]  Anesthesia type (None, local, MAC, general) ?       MAC

## 2018-01-05 NOTE — Progress Notes (Signed)
Subjective:    Patient ID: Michael [name], male    DOB: 01-26-1956, 62 y.o.   MRN: [id number]  HPI Michael [name] is a pleasant 62 year old white male, new to GI today referred by Dr. Jeani Sow for colon cancer screening.  Patient has not had any prior colon evaluation. He has no current GI symptoms, specifically no complaints of abdominal discomfort, changes in bowel habits diarrhea constipation melena or hematochezia. Family history is negative for colon cancer. Patient does have history of coronary artery disease and has a total of 5 stents, the last were done in August 2017.  He is followed by Dr. Percival Spanish and maintained on aspirin and Brilinta.  Also with history of hypertension hyperlipidemia and obesity. Patient says he has been stable from a cardiac standpoint since his stents were placed in 2017 and has not had any issues.  Review of Systems Pertinent positive and negative review of systems were noted in the above HPI section.  All other review of systems was otherwise negative.  Outpatient Encounter Medications as of 01/05/2018  Medication Sig  . aspirin 81 MG tablet Take 81 mg by mouth daily.  [name] atorvastatin (LIPITOR) 80 MG tablet TAKE 1 TABLET DAILY (PLEASE CALL OFFICE AND SCHEDULE APPOINTMENT FOR ANNUAL PHYSICAL)  . BRILINTA 90 MG TABS tablet TAKE 1 TABLET TWICE A DAY  . CHERRY PO Take 50 mg by mouth daily. [place] extract  . Cinnamon 500 MG capsule Take 500 mg by mouth 2 (two) times daily.   . isosorbide mononitrate (IMDUR) 60 MG 24 hr tablet TAKE 1 TABLET DAILY  . Prasterone, DHEA, (DHEA 50 PO) Take by mouth.  . TURMERIC PO Take 50 mg by mouth 2 (two) times daily.   . Vitamin D, Ergocalciferol, (DRISDOL) 50000 units CAPS capsule TAKE 1 CAPSULE EVERY 7 DAYS  . Na Sulfate-K Sulfate-Mg Sulf 17.5-3.13-1.6 GM/177ML SOLN Take as directed for colonoscopy prep.  . nitroGLYCERIN (NITROSTAT) 0.4 MG SL tablet DISSOLVE 1 TABLET UNDER THE TONGUE EVERY 5 MINUTES AS NEEDED FOR CHEST PAIN, UP TO 3  DOSES (Patient not taking: Reported on 01/05/2018)  . [DISCONTINUED] Vitamin D, Ergocalciferol, (DRISDOL) 50000 units CAPS capsule Take 1 capsule (50,000 Units total) every 7 (seven) days by mouth.   No facility-administered encounter medications on file as of 01/05/2018.    No Known Allergies Patient Active Problem List   Diagnosis Date Noted  . Plantar fasciitis 10/12/2017  . Back pain 04/13/2017  . Nonallopathic lesion of thoracic region 04/13/2017  . Nonallopathic lesion of sacral region 04/13/2017  . Nonallopathic lesion of lumbosacral region 04/13/2017  . Impaired glucose tolerance 11/03/2016  . Chest pain with moderate risk of acute coronary syndrome 05/05/2016  . Obesity   . HTN (hypertension)   . CAD S/P multiple PCIs 09/08/2014  . Dyslipidemia 09/08/2014   Social History   Socioeconomic History  . Marital status: Married    Spouse name: Not on file  . Number of children: 4  . Years of education: Not on file  . Highest education level: Not on file  Occupational History  . Occupation: retired  Scientific laboratory technician  . Financial resource strain: Not on file  . Food insecurity:    Worry: Not on file    Inability: Not on file  . Transportation needs:    Medical: Not on file    Non-medical: Not on file  Tobacco Use  . Smoking status: Never Smoker  . Smokeless tobacco: Never Used  Substance and Sexual Activity  . Alcohol use:  No    Alcohol/week: 0.0 oz  . Drug use: No  . Sexual activity: Yes  Lifestyle  . Physical activity:    Days per week: Not on file    Minutes per session: Not on file  . Stress: Not on file  Relationships  . Social connections:    Talks on phone: Not on file    Gets together: Not on file    Attends religious service: Not on file    Active member of club or organization: Not on file    Attends meetings of clubs or organizations: Not on file    Relationship status: Not on file  . Intimate partner violence:    Fear of current or ex partner: Not on  file    Emotionally abused: Not on file    Physically abused: Not on file    Forced sexual activity: Not on file  Other Topics Concern  . 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 Burkland's family history includes Cancer in his brother and brother; Dementia in his father; Heart disease (age of onset: 29) in his maternal uncle; Heart disease (age of onset: 75) in his maternal grandfather; Hypertension in his father.      Objective:    Vitals:   01/05/18 0851  BP: 124/70  Pulse: 80    Physical Exam; well-developed older white male in no acute distress, pleasant blood pressure 124/70 pulse 80, height 5 foot 7, weight 198, BMI 31.0.  HEENT; nontraumatic normocephalic EOMI PERRLA sclera anicteric, Cardiovascular ;regular rate and rhythm with S1-S2, Pulmonary clear bilaterally, Abdomen ;soft, nontender nondistended bowel sounds are active there is no palpable mass or hepatosplenomegaly, Rectal ;exam not done, Extremities; no clubbing cyanosis or edema skin warm dry, Neuro psych ;mood and affect appropriate       Assessment & Plan:   #27 62 year old white male, referred for colon cancer screening.  Patient is average risk for colon cancer and asymptomatic #2 coronary artery disease status post several coronary stents, last were done in August 2017 patient stable since #3 chronic anticoagulation-on Brilinta #4 hypertension #5 obesity  Plan; Patient will be scheduled for colonoscopy with Dr. Fuller Plan.  Procedure was discussed in detail with patient including indications risks and benefits and he is agreeable to proceed. Patient will need to hold Brilinta for 5 days prior to colonoscopy.  He knew his baby aspirin. We will communicate with his cardiologist/Dr. Percival Spanish to assure that holding Brilinta is acceptable for this patient.[place] Nurse Aleene Swanner PA-C 01/05/2018   Cc: Marletta Lor, MD

## 2018-01-05 NOTE — Progress Notes (Signed)
Reviewed and agree with management plan.  Abagail Limb T. Amrom Ore, MD FACG 

## 2018-01-06 ENCOUNTER — Telehealth: Payer: Self-pay | Admitting: *Deleted

## 2018-01-06 NOTE — Telephone Encounter (Signed)
Spoke with Dr. Percival Spanish - he has recommended holding Brilinta for 5 days prior to colonoscopy.   Will alert patient and route to [facility] as well.  Burtis Junes, RN, [facility] [address] [place], [place]  [phone]

## 2018-01-06 NOTE — Telephone Encounter (Signed)
Spoke to the patient and advised him to stop the Brilinta on 4-26 and stay off through and including 5-1. He can resume after the procedure. The patient verbalized understanding the procedure.

## 2018-01-20 ENCOUNTER — Encounter: Payer: Self-pay | Admitting: Gastroenterology

## 2018-01-20 ENCOUNTER — Other Ambulatory Visit: Payer: Self-pay

## 2018-01-20 ENCOUNTER — Ambulatory Visit (AMBULATORY_SURGERY_CENTER): Admitting: Gastroenterology

## 2018-01-20 VITALS — BP 120/61 | HR 58 | Temp 98.0°F | Resp 11 | Ht 67.0 in | Wt 198.0 lb

## 2018-01-20 DIAGNOSIS — K635 Polyp of colon: Secondary | ICD-10-CM | POA: Diagnosis not present

## 2018-01-20 DIAGNOSIS — D126 Benign neoplasm of colon, unspecified: Secondary | ICD-10-CM

## 2018-01-20 DIAGNOSIS — D122 Benign neoplasm of ascending colon: Secondary | ICD-10-CM

## 2018-01-20 DIAGNOSIS — D123 Benign neoplasm of transverse colon: Secondary | ICD-10-CM | POA: Diagnosis not present

## 2018-01-20 DIAGNOSIS — Z1211 Encounter for screening for malignant neoplasm of colon: Secondary | ICD-10-CM | POA: Diagnosis present

## 2018-01-20 MED ORDER — SODIUM CHLORIDE 0.9 % IV SOLN: 500.0000 mL | Freq: Once | INTRAVENOUS | Status: DC

## 2018-01-20 NOTE — Patient Instructions (Signed)
Handout given on polyps  YOU HAD AN ENDOSCOPIC PROCEDURE TODAY AT THE [facility]:   Refer to the procedure report that was given to you for any specific questions about what was found during the examination.  If the procedure report does not answer your questions, please call your gastroenterologist to clarify.  If you requested that your care partner not be given the details of your procedure findings, then the procedure report has been included in a sealed envelope for you to review at your convenience later.  YOU SHOULD EXPECT: Some feelings of bloating in the abdomen. Passage of more gas than usual.  Walking can help get rid of the air that was put into your GI tract during the procedure and reduce the bloating. If you had a lower endoscopy (such as a colonoscopy or flexible sigmoidoscopy) you may notice spotting of blood in your stool or on the toilet paper. If you underwent a bowel prep for your procedure, you may not have a normal bowel movement for a few days.  Please Note:  You might notice some irritation and congestion in your nose or some drainage.  This is from the oxygen used during your procedure.  There is no need for concern and it should clear up in a day or so.  SYMPTOMS TO REPORT IMMEDIATELY:   Following lower endoscopy (colonoscopy or flexible sigmoidoscopy):  Excessive amounts of blood in the stool  Significant tenderness or worsening of abdominal pains  Swelling of the abdomen that is new, acute  Fever of 100F or higher    For urgent or emergent issues, a gastroenterologist can be reached at any hour by calling [phone].   DIET:  We do recommend a small meal at first, but then you may proceed to your regular diet.  Drink plenty of fluids but you should avoid alcoholic beverages for 24 hours.  ACTIVITY:  You should plan to take it easy for the rest of today and you should NOT DRIVE or use heavy machinery until tomorrow (because of the sedation  medicines used during the test).    FOLLOW UP: Our staff will call the number listed on your records the next business day following your procedure to check on you and address any questions or concerns that you may have regarding the information given to you following your procedure. If we do not reach you, we will leave a message.  However, if you are feeling well and you are not experiencing any problems, there is no need to return our call.  We will assume that you have returned to your regular daily activities without incident.  If any biopsies were taken you will be contacted by phone or by letter within the next 1-3 weeks.  Please call us at [phone] if you have not heard about the biopsies in 3 weeks.    SIGNATURES/CONFIDENTIALITY: You and/or your care partner have signed paperwork which will be entered into your electronic medical record.  These signatures attest to the fact that that the information above on your After Visit Summary has been reviewed and is understood.  Full responsibility of the confidentiality of this discharge information lies with you and/or your care-partner. 

## 2018-01-20 NOTE — Op Note (Addendum)
[place] Patient Name: Michael [name] Procedure Date: 01/20/2018 8:30 AM MRN: [id number] Endoscopist: Ladene Artist , MD Age: 62 Referring MD:  Date of Birth: Nov 29, 1955 Gender: Male Account #: [id number] Procedure:                Colonoscopy Indications:              Screening for colorectal malignant neoplasm Medicines:                Monitored Anesthesia Care Procedure:                Pre-Anesthesia Assessment:                           - Prior to the procedure, a History and Physical                            was performed, and patient medications and                            allergies were reviewed. The patient's tolerance of                            previous anesthesia was also reviewed. The risks                            and benefits of the procedure and the sedation                            options and risks were discussed with the patient.                            All questions were answered, and informed consent                            was obtained. Prior Anticoagulants: The patient has                            taken antiplatelet medication (Brilinta), last dose                            was 5 days prior to procedure. ASA Grade                            Assessment: III - A patient with severe systemic                            disease. After reviewing the risks and benefits,                            the patient was deemed in satisfactory condition to                            undergo the procedure.  After obtaining informed consent, the colonoscope                            was passed under direct vision. Throughout the                            procedure, the patient's blood pressure, pulse, and                            oxygen saturations were monitored continuously. The                            Colonoscope was introduced through the anus and                            advanced to the the cecum,  identified by                            appendiceal orifice and ileocecal valve. The                            ileocecal valve, appendiceal orifice, and rectum                            were photographed. The quality of the bowel                            preparation was good. The colonoscopy was performed                            without difficulty. The patient tolerated the                            procedure well. Scope In: 8:39:48 AM Scope Out: 8:54:07 AM Scope Withdrawal Time: 0 hours 11 minutes 49 seconds  Total Procedure Duration: 0 hours 14 minutes 19 seconds  Findings:                 The perianal and digital rectal examinations were                            normal.                           A 4 mm polyp was found in the ascending colon. The                            polyp was sessile. The polyp was removed with a                            cold biopsy forceps. Resection and retrieval were                            complete.  Two sessile polyps were found in the transverse                            colon. The polyps were 7 mm in size. These polyps                            were removed with a cold snare. Resection and                            retrieval were complete.                           The exam was otherwise without abnormality on                            direct and retroflexion views. Complications:            No immediate complications. Estimated blood loss:                            None. Estimated Blood Loss:     Estimated blood loss: none. Impression:               - One 4 mm polyp in the ascending colon, removed                            with a cold biopsy forceps. Resected and retrieved.                           - Two 7 mm polyps in the transverse colon, removed                            with a cold snare. Resected and retrieved.                           - The examination was otherwise normal on direct                             and retroflexion views. Recommendation:           - Repeat colonoscopy in 3-5 years for surveillance                            if polyp(s) are precancerous, otherwise 10 years.                           - Patient has a contact number available for                            emergencies. The signs and symptoms of potential                            delayed complications were discussed with the  patient. Return to normal activities tomorrow.                            Written discharge instructions were provided to the                            patient.                           - Resume previous diet.                           - Continue present medications.                           - Await pathology results.                           - Resume anticoagulant medication (Brilinta) at                            prior dose tomorrow. Refer to managing physician                            for further adjustment of therapy. Ladene Artist, MD 01/20/2018 8:57:52 AM This report has been signed electronically.

## 2018-01-20 NOTE — Progress Notes (Signed)
Report given to PACU, vss 

## 2018-01-20 NOTE — Progress Notes (Signed)
Called to room to assist during endoscopic procedure.  Patient ID and intended procedure confirmed with present staff. Received instructions for my participation in the procedure from the performing physician.  

## 2018-01-21 ENCOUNTER — Telehealth: Payer: Self-pay | Admitting: *Deleted

## 2018-01-21 NOTE — Telephone Encounter (Signed)
  Follow up Call-  Call back number 01/20/2018  Post procedure Call Back phone  # [phone]  Permission to leave phone message Yes  Some recent data might be hidden     Patient questions:  Do you have a fever, pain , or abdominal swelling? No. Pain Score  0 *  Have you tolerated food without any problems? Yes.    Have you been able to return to your normal activities? Yes.    Do you have any questions about your discharge instructions: Diet   No. Medications  No. Follow up visit  No.  Do you have questions or concerns about your Care? No.  Actions: * If pain score is 4 or above: No action needed, pain <4.

## 2018-02-01 ENCOUNTER — Ambulatory Visit: Admitting: Family Medicine

## 2018-02-02 ENCOUNTER — Encounter: Payer: Self-pay | Admitting: Gastroenterology

## 2018-02-04 ENCOUNTER — Other Ambulatory Visit: Payer: Self-pay | Admitting: Family Medicine

## 2018-03-08 ENCOUNTER — Other Ambulatory Visit: Payer: Self-pay | Admitting: Internal Medicine

## 2018-03-18 ENCOUNTER — Other Ambulatory Visit: Payer: Self-pay | Admitting: Cardiology

## 2018-03-18 NOTE — Telephone Encounter (Signed)
°*  STAT* If patient is at the pharmacy, call can be transferred to refill team.   1. Which medications need to be refilled? (please list name of each medication and dose if known)  Needs a new prescription called in,until his Mail Order Refill comes- Isosorbide  2. Which pharmacy/location (including street and city if local pharmacy) is medication to be sent to?[facility]-[address], [place]  3. Do they need a 30 day or 90 day supply? 15

## 2018-03-19 MED ORDER — ISOSORBIDE MONONITRATE ER 60 MG PO TB24: 60.0000 mg | ORAL_TABLET | Freq: Every day | ORAL | 0 refills | Status: DC

## 2018-04-08 ENCOUNTER — Ambulatory Visit (INDEPENDENT_AMBULATORY_CARE_PROVIDER_SITE_OTHER): Admitting: Family Medicine

## 2018-04-08 ENCOUNTER — Encounter: Payer: Self-pay | Admitting: Family Medicine

## 2018-04-08 VITALS — BP 130/76 | HR 76 | Ht 67.0 in | Wt 195.0 lb

## 2018-04-08 DIAGNOSIS — M7918 Myalgia, other site: Secondary | ICD-10-CM | POA: Diagnosis not present

## 2018-04-08 MED ORDER — PREDNISONE 5 MG PO TABS: ORAL_TABLET | ORAL | 0 refills | Status: DC

## 2018-04-08 NOTE — Progress Notes (Signed)
Michael [name] - 62 y.o. male MRN [id number]  Date of birth: 12/11/1955  SUBJECTIVE:  Including CC & ROS.  Chief Complaint  Patient presents with  . Right Hip pain    [name] is a 62 y.o. male that is presenting with right buttock pain. Ongoing for four days. Pain is located in his right buttock. Denies tingling and numbness. He has been increasing his swimming and cycling. Pain is mild to severe when he first gets up and sitting. He has been taking tylenol for the pain. Denies injury or surgeries. He feels the pain is different than the normal pain he experiences in his lower back/buttock region. No inciting event. No radicular symptoms.    Review of Systems  Constitutional: Negative for fever.  HENT: Negative for congestion.   Respiratory: Negative for cough.   Cardiovascular: Negative for chest pain.  Gastrointestinal: Negative for abdominal pain.  Musculoskeletal: Positive for back pain.  Skin: Negative for color change.  Neurological: Negative for weakness.  Hematological: Negative for adenopathy.  Psychiatric/Behavioral: Negative for agitation.    HISTORY: Past Medical, Surgical, Social, and Family History Reviewed & Updated per EMR.   Pertinent Historical Findings include:  Past Medical History:  Diagnosis Date  . Anginal pain ([place])   . CAD S/P PCI to Cx & RCA    a. s/p PCI of LCx (2006) and ostium of RCA (2007) as well as DES to RCA in [place] (05/2014)  . HTN (hypertension)   . Hyperlipidemia   . Myocardial infarction ([place])   . Obesity     Past Surgical History:  Procedure Laterality Date  . CARDIAC CATHETERIZATION N/A 05/06/2016   Procedure: Left Heart Cath and Coronary Angiography;  Surgeon: Nelva Bush, MD;  Location: [facility];  Service: Cardiovascular;  Laterality: N/A;  . CARDIAC CATHETERIZATION N/A 05/06/2016   Procedure: Coronary Stent Intervention;  Surgeon: Nelva Bush, MD;  Location: [facility];  Service:  Cardiovascular;  Laterality: N/A;  . CARDIAC CATHETERIZATION N/A 05/13/2016   Procedure: Coronary/Graft Angiography;  Surgeon: Peter M Martinique, MD;  Location: [facility];  Service: Cardiovascular;  Laterality: N/A;  . CORONARY ANGIOPLASTY WITH STENT PLACEMENT  2006   in [place], occluded CFX, s/p Taxus stent  . CORONARY ANGIOPLASTY WITH STENT PLACEMENT  2007   in [place], ostial RCA stent  . CORONARY ANGIOPLASTY WITH STENT PLACEMENT  2015   in [place], RCA stent  . TONSILLECTOMY AND ADENOIDECTOMY       No Known Allergies  Family History  Problem Relation Age of Onset  . Hypertension Father   . Dementia Father        Vascular  . Cancer Brother        type unknown  . Cancer Brother        type unknown  . Heart disease Maternal Grandfather 23       Died probably of heart diseasse  . Heart disease Maternal Uncle 68       Died of "heart exploding"     Social History   Socioeconomic History  . Marital status: Married    Spouse name: Not on file  . Number of children: 4  . Years of education: Not on file  . Highest education level: Not on file  Occupational History  . Occupation: retired  Scientific laboratory technician  . Financial resource strain: Not on file  . Food insecurity:    Worry: Not on file    Inability: Not on file  .  Transportation needs:    Medical: Not on file    Non-medical: Not on file  Tobacco Use  . Smoking status: Never Smoker  . Smokeless tobacco: Never Used  Substance and Sexual Activity  . Alcohol use: No    Alcohol/week: 0.0 oz  . Drug use: No  . Sexual activity: Yes  Lifestyle  . Physical activity:    Days per week: Not on file    Minutes per session: Not on file  . Stress: Not on file  Relationships  . Social connections:    Talks on phone: Not on file    Gets together: Not on file    Attends religious service: Not on file    Active member of club or organization: Not on file    Attends meetings of clubs or organizations: Not on  file    Relationship status: Not on file  . Intimate partner violence:    Fear of current or ex partner: Not on file    Emotionally abused: Not on file    Physically abused: Not on file    Forced sexual activity: Not on file  Other Topics Concern  . 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     PHYSICAL EXAM:  VS: BP 130/76 (BP Location: Left Arm, Patient Position: Sitting, Cuff Size: Normal)   Pulse 76   Ht 5\' 7"  (1.702 m)   Wt 195 lb (88.5 kg)   SpO2 98%   BMI 30.54 kg/m  Physical Exam Gen: NAD, alert, cooperative with exam, well-appearing ENT: normal lips, normal nasal mucosa,  Eye: normal EOM, normal conjunctiva and lids CV:  no edema, +2 pedal pulses   Resp: no accessory muscle use, non-labored,  Skin: no rashes, no areas of induration  Neuro: normal tone, normal sensation to touch Psych:  normal insight, alert and oriented MSK:  Back/Right hip:  TTP in the gluteal region. Occurring over glute med/min  Normal IR and ER  No TTP of the GT  Normal strength to resistance with hip flexion, knee flexion and extension  Mild pain with hip abduction testing and mild weakness.  Normal gait  Negative SLR b/l  Neurovascularly intact       ASSESSMENT & PLAN:   Gluteal pain Doesn't appear to be his SI joint today. This does seem muscular and likely spasm. History of multiple stents so avoiding NSAIDS.  - prednisone  - counseled on HEP  - if no improvement consider injection vs PT and imaging.

## 2018-04-08 NOTE — Patient Instructions (Signed)
Nice to meet you  Please try the exercises  Please heat or ice on the area  Please avoid long periods of sitting  Please follow up in 3-4 weeks if the pain hasn't improved.

## 2018-04-10 DIAGNOSIS — M7918 Myalgia, other site: Secondary | ICD-10-CM | POA: Insufficient documentation

## 2018-04-10 NOTE — Assessment & Plan Note (Signed)
Doesn't appear to be his SI joint today. This does seem muscular and likely spasm. History of multiple stents so avoiding NSAIDS.  - prednisone  - counseled on HEP  - if no improvement consider injection vs PT and imaging.

## 2018-04-26 ENCOUNTER — Other Ambulatory Visit: Payer: Self-pay | Admitting: Family Medicine

## 2018-05-18 ENCOUNTER — Other Ambulatory Visit: Payer: Self-pay | Admitting: Internal Medicine

## 2018-05-18 ENCOUNTER — Other Ambulatory Visit: Payer: Self-pay | Admitting: Physician Assistant

## 2018-05-19 NOTE — Telephone Encounter (Signed)
This is Dr. Hochrein's pt. °

## 2018-05-31 ENCOUNTER — Telehealth: Payer: Self-pay | Admitting: Cardiology

## 2018-05-31 MED ORDER — ATORVASTATIN CALCIUM 80 MG PO TABS: ORAL_TABLET | ORAL | 1 refills | Status: DC

## 2018-05-31 NOTE — Telephone Encounter (Signed)
Returned call to patient, he reports his PCP is retiring and he usually prescribes his atorvastatin and they will not send in new rx due to his retirement.     He is requesting a new prescription.    [facility].   Patient states he will be establishing with a new PCP.

## 2018-05-31 NOTE — Telephone Encounter (Signed)
New Message:   Patient calling having some concerns about a medication Atvarstatin 850 mg.

## 2018-06-07 ENCOUNTER — Encounter: Payer: Self-pay | Admitting: Family Medicine

## 2018-06-07 ENCOUNTER — Ambulatory Visit (INDEPENDENT_AMBULATORY_CARE_PROVIDER_SITE_OTHER): Admitting: Family Medicine

## 2018-06-07 VITALS — BP 108/68 | HR 71 | Temp 97.6°F | Ht 67.0 in | Wt 198.8 lb

## 2018-06-07 DIAGNOSIS — R7302 Impaired glucose tolerance (oral): Secondary | ICD-10-CM

## 2018-06-07 DIAGNOSIS — E785 Hyperlipidemia, unspecified: Secondary | ICD-10-CM | POA: Diagnosis not present

## 2018-06-07 DIAGNOSIS — Z23 Encounter for immunization: Secondary | ICD-10-CM | POA: Diagnosis not present

## 2018-06-07 DIAGNOSIS — I2510 Atherosclerotic heart disease of native coronary artery without angina pectoris: Secondary | ICD-10-CM | POA: Diagnosis not present

## 2018-06-07 DIAGNOSIS — E6609 Other obesity due to excess calories: Secondary | ICD-10-CM

## 2018-06-07 DIAGNOSIS — R351 Nocturia: Secondary | ICD-10-CM

## 2018-06-07 DIAGNOSIS — I10 Essential (primary) hypertension: Secondary | ICD-10-CM

## 2018-06-07 DIAGNOSIS — L80 Vitiligo: Secondary | ICD-10-CM | POA: Insufficient documentation

## 2018-06-07 DIAGNOSIS — N400 Enlarged prostate without lower urinary tract symptoms: Secondary | ICD-10-CM | POA: Insufficient documentation

## 2018-06-07 DIAGNOSIS — N401 Enlarged prostate with lower urinary tract symptoms: Secondary | ICD-10-CM | POA: Diagnosis not present

## 2018-06-07 DIAGNOSIS — Z86010 Personal history of colonic polyps: Secondary | ICD-10-CM | POA: Insufficient documentation

## 2018-06-07 DIAGNOSIS — Z9861 Coronary angioplasty status: Secondary | ICD-10-CM

## 2018-06-07 NOTE — Assessment & Plan Note (Addendum)
S: history of multiple stents. Sees. Dr. Percival Spanish each February. On aspirin and brilinta, high dose statin. Imdur prevents chest pain effectively. No shortness of breath. Exercises regularly and pushes his lawnmower. Uses nitroglycerin extremely sparingly A/P:  Continue current rx

## 2018-06-07 NOTE — Assessment & Plan Note (Signed)
Last summer able to get weight down by watching sugars and portions- he wants to work on this again though is at least still exercising regularly

## 2018-06-07 NOTE — Assessment & Plan Note (Addendum)
S: Nocturia twice a night, incomplete voiding. Still feels rested in Am when gets up A/P: discussed possible flomax- hold off for now. He is considering trialing saw palmetto

## 2018-06-07 NOTE — Progress Notes (Signed)
Phone: [phone]  Subjective:  Patient presents today to establish care with me as their new primary care provider. Patient was formerly a patient of Dr. Burnice Logan. Chief complaint-noted.   See problem oriented charting ROS- sparing chest pain- resolves with nitroglycerin. No edema. No shortness of breath. Skin changes related to vitiligo  The following were reviewed and entered/updated in epic: Past Medical History:  Diagnosis Date  . Anginal pain ([place])   . CAD S/P PCI to Cx & RCA    a. s/p PCI of LCx (2006) and ostium of RCA (2007) as well as DES to RCA in [place] (05/2014)  . HTN (hypertension)   . Hyperlipidemia   . Myocardial infarction ([place])   . Obesity    Patient Active Problem List   Diagnosis Date Noted  . CAD S/P multiple PCIs 09/08/2014    Priority: High  . Impaired glucose tolerance 11/03/2016    Priority: Medium  . HTN (hypertension)     Priority: Medium  . Dyslipidemia 09/08/2014    Priority: Medium  . Gluteal pain 04/10/2018    Priority: Low  . Plantar fasciitis 10/12/2017    Priority: Low  . Back pain 04/13/2017    Priority: Low  . Nonallopathic lesion of thoracic region 04/13/2017    Priority: Low  . Nonallopathic lesion of sacral region 04/13/2017    Priority: Low  . Nonallopathic lesion of lumbosacral region 04/13/2017    Priority: Low  . Obesity     Priority: Low   Past Surgical History:  Procedure Laterality Date  . CARDIAC CATHETERIZATION N/A 05/06/2016   Procedure: Left Heart Cath and Coronary Angiography;  Surgeon: Nelva Bush, MD;  Location: [facility];  Service: Cardiovascular;  Laterality: N/A;  . CARDIAC CATHETERIZATION N/A 05/06/2016   Procedure: Coronary Stent Intervention;  Surgeon: Nelva Bush, MD;  Location: [facility];  Service: Cardiovascular;  Laterality: N/A;  . CARDIAC CATHETERIZATION N/A 05/13/2016   Procedure: Coronary/Graft Angiography;  Surgeon: Peter M Martinique, MD;  Location: [facility];  Service: Cardiovascular;  Laterality: N/A;  . CORONARY ANGIOPLASTY WITH STENT PLACEMENT  2006   in [place], occluded CFX, s/p Taxus stent  . CORONARY ANGIOPLASTY WITH STENT PLACEMENT  2007   in [place], ostial RCA stent  . CORONARY ANGIOPLASTY WITH STENT PLACEMENT  2015   in [place], RCA stent  . TONSILLECTOMY AND ADENOIDECTOMY       Family History  Problem Relation Age of Onset  . Hypertension Father   . Dementia Father        Vascular. patient states dementia/possible alzheimers as well  . Heart disease Mother        died in 63s  . Hypertension Sister   . Cancer Brother        type unknown  . Cancer Brother        type unknown  . Heart disease Maternal Grandfather 49       Died probably of heart diseasse  . Heart disease Maternal Uncle 60       Died of "heart exploding"  . Cerebral palsy Daughter   . Stroke Son        severe problems with blood clotting    Medications- reviewed and updated Current Outpatient Medications  Medication Sig Dispense Refill  . aspirin 81 MG tablet Take 81 mg by mouth daily.    [name] atorvastatin (LIPITOR) 80 MG tablet TAKE 1 TABLET DAILY 90 tablet 1  . CHERRY PO Take 50  mg by mouth daily. [place] extract    . Cinnamon 500 MG capsule Take 500 mg by mouth 2 (two) times daily.     . isosorbide mononitrate (IMDUR) 60 MG 24 hr tablet Take 1 tablet (60 mg total) by mouth daily. 15 tablet 0  . nitroGLYCERIN (NITROSTAT) 0.4 MG SL tablet DISSOLVE 1 TABLET UNDER THE TONGUE EVERY 5 MINUTES AS NEEDED FOR CHEST PAIN, UP TO 3 DOSES 25 tablet 3  . Prasterone, DHEA, (DHEA 50 PO) Take by mouth.    . ticagrelor (BRILINTA) 90 MG TABS tablet Take 1 tablet (90 mg total) by mouth 2 (two) times daily. 180 tablet 1  . TURMERIC PO Take 50 mg by mouth 2 (two) times daily.     . Vitamin D, Ergocalciferol, (DRISDOL) 50000 units CAPS capsule TAKE 1 CAPSULE EVERY 7 DAYS 12 capsule 0   No current facility-administered medications for this visit.      Allergies-reviewed and updated No Known Allergies  Social History   Social History Narrative   Lives with wife.  4 kids total (2 step, 2 biological). 1 that has CP. Daughter [name]- comes to [facility]).        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 Oropharynx normal Neck: no thyromegaly CV: RRR no murmurs rubs or gallops Lungs: CTAB no crackles, wheeze, rhonchi Abdomen: soft/nontender/nondistended/normal bowel sounds. No rebound or guarding.  Ext: no edema Skin: warm, dry Neuro: grossly normal, moves all extremities, PERRLA   Assessment/Plan:  CAD S/P multiple PCIs S: history of multiple stents. Sees. Dr. Percival Spanish each February. On aspirin and brilinta, high dose statin. Imdur prevents chest pain effectively. No shortness of breath. Exercises regularly and pushes his lawnmower. Uses nitroglycerin extremely sparingly A/P:  Continue current rx    Dyslipidemia S: aggresively controlled on atorvastatin 80mg  Lab Results  Component Value Date   CHOL 101 12/08/2017   HDL 48.90 12/08/2017   LDLCALC 34 12/08/2017   TRIG 89.0 12/08/2017   CHOLHDL 2 12/08/2017   A/P: continue current rx    HTN (hypertension) S: controlled on imdur 60mg  once daily BP Readings from Last 3 Encounters:  06/07/18 108/68  04/08/18 130/76  01/20/18 120/61  A/P: We discussed blood pressure goal of <140/90. Continue current meds    Obesity Last summer able to get weight down by watching sugars and portions- he wants to work on this again though is at least still exercising regularly   Impaired glucose tolerance Lab Results  Component  Value Date   HGBA1C 6.0 12/08/2017  at risk for diabetes. Should continue to work on weight loss, healthy eating, regular exercise. Can recheck at least yearly.   Gluteal pain Resolved without prednisone thankfully    No future appointments. No follow-ups on file.  Lab/Order associations: Need for prophylactic vaccination and inoculation against influenza - Plan: Flu Vaccine QUAD 36+ mos IM  CAD S/P multiple PCIs  Dyslipidemia  Essential hypertension  Class 1 obesity due to excess calories with serious comorbidity in adult, unspecified BMI  Impaired glucose tolerance  Gluteal pain  No orders of the defined types were placed in this encounter.   Return precautions advised.  Garret Reddish, MD

## 2018-06-07 NOTE — Assessment & Plan Note (Signed)
S: controlled on imdur 60mg  once daily BP Readings from Last 3 Encounters:  06/07/18 108/68  04/08/18 130/76  01/20/18 120/61  A/P: We discussed blood pressure goal of <140/90. Continue current meds

## 2018-06-07 NOTE — Assessment & Plan Note (Signed)
Lab Results  Component Value Date   HGBA1C 6.0 12/08/2017  at risk for diabetes. Should continue to work on weight loss, healthy eating, regular exercise. Can recheck at least yearly.

## 2018-06-07 NOTE — Patient Instructions (Addendum)
No changes today  Can trial saw palmetto if you want- dont see any obvious interactions

## 2018-06-07 NOTE — Assessment & Plan Note (Signed)
Resolved without prednisone thankfully

## 2018-06-07 NOTE — Assessment & Plan Note (Signed)
S: aggresively controlled on atorvastatin 80mg  Lab Results  Component Value Date   CHOL 101 12/08/2017   HDL 48.90 12/08/2017   LDLCALC 34 12/08/2017   TRIG 89.0 12/08/2017   CHOLHDL 2 12/08/2017   A/P: continue current rx

## 2018-06-28 ENCOUNTER — Other Ambulatory Visit: Payer: Self-pay | Admitting: Cardiology

## 2018-06-28 NOTE — Telephone Encounter (Signed)
Rx request sent to pharmacy.  

## 2018-07-27 ENCOUNTER — Other Ambulatory Visit: Payer: Self-pay | Admitting: Family Medicine

## 2018-09-29 ENCOUNTER — Encounter: Payer: Self-pay | Admitting: Family Medicine

## 2018-09-29 ENCOUNTER — Ambulatory Visit (INDEPENDENT_AMBULATORY_CARE_PROVIDER_SITE_OTHER): Admitting: Family Medicine

## 2018-09-29 VITALS — BP 110/72 | HR 48 | Temp 97.4°F | Ht 67.0 in | Wt 199.6 lb

## 2018-09-29 DIAGNOSIS — R7302 Impaired glucose tolerance (oral): Secondary | ICD-10-CM

## 2018-09-29 DIAGNOSIS — Z6831 Body mass index (BMI) 31.0-31.9, adult: Secondary | ICD-10-CM | POA: Diagnosis not present

## 2018-09-29 DIAGNOSIS — R682 Dry mouth, unspecified: Secondary | ICD-10-CM | POA: Diagnosis not present

## 2018-09-29 LAB — COMPREHENSIVE METABOLIC PANEL
ALT: 20 U/L (ref 0–53)
AST: 22 U/L (ref 0–37)
Albumin: 4.1 g/dL (ref 3.5–5.2)
Alkaline Phosphatase: 68 U/L (ref 39–117)
BUN: 20 mg/dL (ref 6–23)
CO2: 29 mEq/L (ref 19–32)
Calcium: 9.4 mg/dL (ref 8.4–10.5)
Chloride: 105 mEq/L (ref 96–112)
Creatinine, Ser: 0.92 mg/dL (ref 0.40–1.50)
GFR: 88.16 mL/min (ref >60.00)
Glucose, Bld: 94 mg/dL (ref 70–99)
Potassium: 4.4 mEq/L (ref 3.5–5.1)
Sodium: 141 mEq/L (ref 135–145)
Total Bilirubin: 1.4 mg/dL — ABNORMAL HIGH (ref 0.2–1.2)
Total Protein: 6.4 g/dL (ref 6.0–8.3)

## 2018-09-29 LAB — HEMOGLOBIN A1C: Hgb A1c MFr Bld: 6.1 % (ref 4.6–6.5)

## 2018-09-29 NOTE — Progress Notes (Signed)
Subjective:  Michael [name] is a 63 y.o. year old very pleasant male patient who presents for/with See problem oriented charting ROS- baseline nocturia and polyuria. No chest pain (other than vague chest pain at times with rest but no exertional pain- follows with cardiology)  or shortness of breath. No headache or blurry vision.    Past Medical History-  Patient Active Problem List   Diagnosis Date Noted  . CAD S/P multiple PCIs 09/08/2014    Priority: High  . History of adenomatous polyp of colon 06/07/2018    Priority: Medium  . BPH (benign prostatic hyperplasia) 06/07/2018    Priority: Medium  . Impaired glucose tolerance 11/03/2016    Priority: Medium  . HTN (hypertension)     Priority: Medium  . Dyslipidemia 09/08/2014    Priority: Medium  . Vitiligo 06/07/2018    Priority: Low  . Gluteal pain 04/10/2018    Priority: Low  . Plantar fasciitis 10/12/2017    Priority: Low  . Back pain 04/13/2017    Priority: Low  . Nonallopathic lesion of thoracic region 04/13/2017    Priority: Low  . Nonallopathic lesion of sacral region 04/13/2017    Priority: Low  . Nonallopathic lesion of lumbosacral region 04/13/2017    Priority: Low  . Obesity     Priority: Low    Medications- reviewed and updated Current Outpatient Medications  Medication Sig Dispense Refill  . aspirin 81 MG tablet Take 81 mg by mouth daily.    [name] atorvastatin (LIPITOR) 80 MG tablet TAKE 1 TABLET DAILY 90 tablet 1  . CHERRY PO Take 50 mg by mouth daily. [place] extract    . Cinnamon 500 MG capsule Take 500 mg by mouth 2 (two) times daily.     . isosorbide mononitrate (IMDUR) 60 MG 24 hr tablet Take 1 tablet (60 mg total) by mouth daily. 15 tablet 0  . nitroGLYCERIN (NITROSTAT) 0.4 MG SL tablet DISSOLVE 1 TABLET UNDER THE TONGUE EVERY 5 MINUTES AS NEEDED FOR CHEST PAIN UP TO 3 DOSES 1 tablet 12  . Prasterone, DHEA, (DHEA 50 PO) Take by mouth.    . ticagrelor (BRILINTA) 90 MG TABS tablet Take 1 tablet (90 mg  total) by mouth 2 (two) times daily. 180 tablet 1  . TURMERIC PO Take 50 mg by mouth daily.     . Vitamin D, Ergocalciferol, (DRISDOL) 1.25 MG (50000 UT) CAPS capsule TAKE 1 CAPSULE EVERY 7 DAYS 12 capsule 4   No current facility-administered medications for this visit.     Objective: BP 110/72 (BP Location: Left Arm, Patient Position: Sitting, Cuff Size: Large)   Pulse (!) 48   Temp (!) 97.4 F (36.3 C) (Oral)   Ht 5\' 7"  (1.702 m)   Wt 199 lb 9.6 oz (90.5 kg)   SpO2 98%   BMI 31.26 kg/m  Gen: NAD, resting comfortably Nasal mucosa normal, dry lips, dry inside of mouth CV: RRR no murmurs rubs or gallops Lungs: CTAB no crackles, wheeze, rhonchi Abdomen: soft/nontender/nondistended/normal bowel sounds. No rebound or guarding.  Ext: no edema Skin: warm, dry  Assessment/Plan:  Dry mouth  s: Patient complains of dry mouth for around a month- has had dry lips since at least November. He was nervous about development of diabetes. Polyuria same as baseline- he has prostate issues. Nocturia 2x a night.   Wondered if saw palmetto was causing it so stopped that but no improvement. Up todate does not list dry mouth with brilinta or imdur or atorvastatin  A/P: Unclear cause of dry mouth-I agree with patient it is reasonable to evaluate for diabetes-we will get hemoglobin A1c.Possibly mild dehydration related-seems to drink at least 32 ounces a day if not more but thinks he could increase this slightly if no cause of his symptoms on labs  Impaired glucose tolerance Obesity  s: Has had elevated A1c in the past Lab Results  Component Value Date   HGBA1C 6.0 12/08/2017    Patient is overweight with BMI of 31-205-210 but got down to 180- trended back up over the holiday.  A/P: Hopefully this has not worsened in regards to his A1c and hope this has not progressed to diabetes In relation to increased diabetes risk we discussed- Encouraged need for healthy eating, regular exercise, weight loss.     Future Appointments  Date Time Provider [place]  11/18/2018  9:00 AM Minus Breeding, MD [facility]  12/13/2018  8:20 AM [name], MD [facility]   Lab/Order associations: FASTING Impaired glucose tolerance - Plan: Hemoglobin A1c  Dry mouth - Plan: Comprehensive metabolic panel, Hemoglobin A1c  BMI 31.0-31.9,adult  Return precautions advised.  Garret Reddish, MD

## 2018-09-29 NOTE — Assessment & Plan Note (Signed)
Obesity  s: Has had elevated A1c in the past Lab Results  Component Value Date   HGBA1C 6.0 12/08/2017    Patient is overweight with BMI of 31-205-210 but got down to 180- trended back up over the holiday.  A/P: Hopefully this has not worsened in regards to his A1c and hope this has not progressed to diabetes In relation to increased diabetes risk we discussed- Encouraged need for healthy eating, regular exercise, weight loss.

## 2018-09-29 NOTE — Patient Instructions (Addendum)
Please stop by lab before you go  I don't see an obvious medication cause for your dry mouth.

## 2018-11-16 NOTE — Progress Notes (Signed)
HPI The patient presents for evaluation of coronary disease. He moved from [place]. A cath done in [place] before he moved demonstrated a right coronary lesion that was treated with a drug-eluting stent.  In Sept 2016 he was admitted with unstable angina.  A myoview was low risk. He then presented 05/05/16 again with angina. Cath revealed patent stents in the RCA and CFX with disease in his mid LAD. He received two DES. He was discharged then re admitted with similar chest pain and Troponin elevation 05/13/16. Re look cath showed patent LAD stents, CFX stent, and RCA stent. There was a 70% small Dx2 branch narrowing. There was no loss of side branches noted. It was suspected he had spasm. Nitrates were added.  He was in the ED in August 2018 with chest pain and he was managed medically.   He presents for follow up.    Since I last saw him he has done well.  The patient denies any new symptoms such as chest discomfort, neck or arm discomfort. There has been no new shortness of breath, PND or orthopnea. There have been no reported palpitations, presyncope or syncope.  He walks a lot.  He does yardwork.  He is retired.   Current Outpatient Medications  Medication Sig Dispense Refill  . aspirin 81 MG tablet Take 81 mg by mouth daily.    [name] atorvastatin (LIPITOR) 80 MG tablet TAKE 1 TABLET DAILY 90 tablet 1  . CHERRY PO Take 50 mg by mouth daily. [place] extract    . Cinnamon 500 MG capsule Take 500 mg by mouth 2 (two) times daily.     . isosorbide mononitrate (IMDUR) 60 MG 24 hr tablet Take 1 tablet (60 mg total) by mouth daily. 15 tablet 0  . nitroGLYCERIN (NITROSTAT) 0.4 MG SL tablet DISSOLVE 1 TABLET UNDER THE TONGUE EVERY 5 MINUTES AS NEEDED FOR CHEST PAIN UP TO 3 DOSES 1 tablet 12  . Prasterone, DHEA, (DHEA 50 PO) Take by mouth.    . ticagrelor (BRILINTA) 90 MG TABS tablet Take 1 tablet (90 mg total) by mouth 2 (two) times daily. 180 tablet 1  . TURMERIC PO Take 50 mg by mouth daily.     . Vitamin  D, Ergocalciferol, (DRISDOL) 1.25 MG (50000 UT) CAPS capsule TAKE 1 CAPSULE EVERY 7 DAYS 12 capsule 4   No current facility-administered medications for this visit.     Past Medical History:  Diagnosis Date  . CAD S/P PCI to Cx & RCA    a. s/p PCI of LCx (2006) and ostium of RCA (2007) as well as DES to RCA in [place] (05/2014)  . HTN (hypertension)   . Hyperlipidemia     Past Surgical History:  Procedure Laterality Date  . CARDIAC CATHETERIZATION N/A 05/06/2016   Procedure: Left Heart Cath and Coronary Angiography;  Surgeon: Nelva Bush, MD;  Location: [facility];  Service: Cardiovascular;  Laterality: N/A;  . CARDIAC CATHETERIZATION N/A 05/06/2016   Procedure: Coronary Stent Intervention;  Surgeon: Nelva Bush, MD;  Location: [facility];  Service: Cardiovascular;  Laterality: N/A;  . CARDIAC CATHETERIZATION N/A 05/13/2016   Procedure: Coronary/Graft Angiography;  Surgeon: Peter M Martinique, MD;  Location: [facility];  Service: Cardiovascular;  Laterality: N/A;  . CORONARY ANGIOPLASTY WITH STENT PLACEMENT  2006   in [place], occluded CFX, s/p Taxus stent  . CORONARY ANGIOPLASTY WITH STENT PLACEMENT  2007   in [place], ostial RCA stent  . CORONARY  ANGIOPLASTY WITH STENT PLACEMENT  2015   in [place], RCA stent  . TONSILLECTOMY AND ADENOIDECTOMY       ROS:    As stated in the HPI and negative for all other systems.  PHYSICAL EXAM BP 124/62   Pulse 76   Ht 5\' 7"  (1.702 m)   Wt 196 lb (88.9 kg)   BMI 30.70 kg/m   GENERAL:  Well appearing NECK:  No jugular venous distention, waveform within normal limits, carotid upstroke brisk and symmetric, no bruits, no thyromegaly LUNGS:  Clear to auscultation bilaterally CHEST:  Unremarkable HEART:  PMI not displaced or sustained,S1 and S2 within normal limits, no S3, no S4, no clicks, no rubs, no murmurs ABD:  Flat, positive bowel sounds normal in frequency in pitch, no bruits, no rebound, no  guarding, no midline pulsatile mass, no hepatomegaly, no splenomegaly EXT:  2 plus pulses throughout, no edema, no cyanosis no clubbing SKIN:  Vitiligo.       EKG: Normal sinus rhythm, rate 76, axis within normal limits, intervals within normal limits, probable old inferior infarct, T wave inversions in the inferior leads unchanged from previous.  Lab Results  Component Value Date   CHOL 101 12/08/2017   TRIG 89.0 12/08/2017   HDL 48.90 12/08/2017   LDLCALC 34 12/08/2017     ASSESSMENT AND PLAN   CAD:   The patient has no new sypmtoms.  No further cardiovascular testing is indicated.  We will continue with aggressive risk reduction and meds as listed.  DYSLIPIDEMIA:   LDL was excellent last year as above.  No change in therapy.  HTN: Blood pressure is at target.  No change in therapy.

## 2018-11-18 ENCOUNTER — Ambulatory Visit (INDEPENDENT_AMBULATORY_CARE_PROVIDER_SITE_OTHER): Admitting: Cardiology

## 2018-11-18 ENCOUNTER — Encounter: Payer: Self-pay | Admitting: Cardiology

## 2018-11-18 VITALS — BP 124/62 | HR 76 | Ht 67.0 in | Wt 196.0 lb

## 2018-11-18 DIAGNOSIS — E785 Hyperlipidemia, unspecified: Secondary | ICD-10-CM

## 2018-11-18 DIAGNOSIS — I2510 Atherosclerotic heart disease of native coronary artery without angina pectoris: Secondary | ICD-10-CM

## 2018-11-18 DIAGNOSIS — I10 Essential (primary) hypertension: Secondary | ICD-10-CM | POA: Diagnosis not present

## 2018-11-18 DIAGNOSIS — Z9861 Coronary angioplasty status: Secondary | ICD-10-CM

## 2018-11-18 NOTE — Patient Instructions (Signed)
Medication Instructions:  Continue current medications  If you need a refill on your cardiac medications before your next appointment, please call your pharmacy.  Labwork: None Ordered   Testing/Procedures: None Ordered  Follow-Up: You will need a follow up appointment in 1 Year.  Please call our office 2 months in advance to schedule this appointment.  You may see Dr Hochrein or one of the following Advanced Practice Providers on your designated Care Team:   Rhonda Barrett, PA-C . Kathryn Lawrence, DNP, ANP   At [facility], you and your health needs are our priority.  As part of our continuing mission to provide you with exceptional heart care, we have created designated Provider Care Teams.  These Care Teams include your primary Cardiologist (physician) and Advanced Practice Providers (APPs -  Physician Assistants and Nurse Practitioners) who all work together to provide you with the care you need, when you need it.  Thank you for choosing [facility]!!     

## 2018-12-09 ENCOUNTER — Other Ambulatory Visit: Payer: Self-pay | Admitting: Cardiology

## 2018-12-10 ENCOUNTER — Other Ambulatory Visit: Payer: Self-pay

## 2018-12-13 ENCOUNTER — Encounter: Admitting: Family Medicine

## 2018-12-16 ENCOUNTER — Encounter (HOSPITAL_COMMUNITY): Payer: Self-pay | Admitting: Emergency Medicine

## 2018-12-16 ENCOUNTER — Emergency Department (HOSPITAL_COMMUNITY)

## 2018-12-16 ENCOUNTER — Telehealth: Payer: Self-pay | Admitting: Cardiology

## 2018-12-16 ENCOUNTER — Observation Stay (HOSPITAL_COMMUNITY)
Admission: EM | Admit: 2018-12-16 | Discharge: 2018-12-17 | Disposition: A | Discharge status: Home / Self Care | Attending: Cardiology | Admitting: Cardiology

## 2018-12-16 DIAGNOSIS — I10 Essential (primary) hypertension: Secondary | ICD-10-CM | POA: Diagnosis not present

## 2018-12-16 DIAGNOSIS — Z7982 Long term (current) use of aspirin: Secondary | ICD-10-CM | POA: Diagnosis not present

## 2018-12-16 DIAGNOSIS — R0789 Other chest pain: Secondary | ICD-10-CM | POA: Diagnosis present

## 2018-12-16 DIAGNOSIS — R072 Precordial pain: Secondary | ICD-10-CM

## 2018-12-16 DIAGNOSIS — Z9861 Coronary angioplasty status: Secondary | ICD-10-CM

## 2018-12-16 DIAGNOSIS — Z79899 Other long term (current) drug therapy: Secondary | ICD-10-CM | POA: Diagnosis not present

## 2018-12-16 DIAGNOSIS — E785 Hyperlipidemia, unspecified: Secondary | ICD-10-CM | POA: Diagnosis not present

## 2018-12-16 DIAGNOSIS — I2510 Atherosclerotic heart disease of native coronary artery without angina pectoris: Secondary | ICD-10-CM | POA: Diagnosis not present

## 2018-12-16 DIAGNOSIS — E669 Obesity, unspecified: Secondary | ICD-10-CM | POA: Diagnosis not present

## 2018-12-16 DIAGNOSIS — R079 Chest pain, unspecified: Secondary | ICD-10-CM

## 2018-12-16 DIAGNOSIS — I208 Other forms of angina pectoris: Secondary | ICD-10-CM | POA: Diagnosis present

## 2018-12-16 DIAGNOSIS — I209 Angina pectoris, unspecified: Secondary | ICD-10-CM | POA: Diagnosis not present

## 2018-12-16 DIAGNOSIS — I2089 Other forms of angina pectoris: Secondary | ICD-10-CM | POA: Diagnosis present

## 2018-12-16 LAB — COMPREHENSIVE METABOLIC PANEL
ALT: 28 U/L (ref 0–44)
AST: 29 U/L (ref 15–41)
Albumin: 3.7 g/dL (ref 3.5–5.0)
Alkaline Phosphatase: 75 U/L (ref 38–126)
Anion gap: 7 (ref 5–15)
BUN: 23 mg/dL (ref 8–23)
CO2: 27 mmol/L (ref 22–32)
Calcium: 9.3 mg/dL (ref 8.9–10.3)
Chloride: 103 mmol/L (ref 98–111)
Creatinine, Ser: 1.06 mg/dL (ref 0.61–1.24)
GFR calc Af Amer: >60 mL/min (ref >60)
GFR calc non Af Amer: >60 mL/min (ref >60)
Glucose, Bld: 117 mg/dL — ABNORMAL HIGH (ref 70–99)
Potassium: 3.8 mmol/L (ref 3.5–5.1)
Sodium: 137 mmol/L (ref 135–145)
Total Bilirubin: 1.7 mg/dL — ABNORMAL HIGH (ref 0.3–1.2)
Total Protein: 6.5 g/dL (ref 6.5–8.1)

## 2018-12-16 LAB — CBC
HCT: 40.9 % (ref 39.0–52.0)
HCT: 41.4 % (ref 39.0–52.0)
Hemoglobin: 14.3 g/dL (ref 13.0–17.0)
Hemoglobin: 14 g/dL (ref 13.0–17.0)
MCH: 32.6 pg (ref 26.0–34.0)
MCH: 31.6 pg (ref 26.0–34.0)
MCHC: 35 g/dL (ref 30.0–36.0)
MCHC: 33.8 g/dL (ref 30.0–36.0)
MCV: 93.2 fL (ref 80.0–100.0)
MCV: 93.5 fL (ref 80.0–100.0)
Platelets: 200 10*3/uL (ref 150–400)
Platelets: 194 10*3/uL (ref 150–400)
RBC: 4.39 MIL/uL (ref 4.22–5.81)
RBC: 4.43 MIL/uL (ref 4.22–5.81)
RDW: 12.7 % (ref 11.5–15.5)
RDW: 12.7 % (ref 11.5–15.5)
WBC: 7 10*3/uL (ref 4.0–10.5)
WBC: 7.3 10*3/uL (ref 4.0–10.5)
nRBC: 0 % (ref 0.0–0.2)
nRBC: 0 % (ref 0.0–0.2)

## 2018-12-16 LAB — HEPATIC FUNCTION PANEL
ALT: 28 U/L (ref 0–44)
AST: 27 U/L (ref 15–41)
Albumin: 3.6 g/dL (ref 3.5–5.0)
Alkaline Phosphatase: 70 U/L (ref 38–126)
Bilirubin, Direct: 0.3 mg/dL — ABNORMAL HIGH (ref 0.0–0.2)
Indirect Bilirubin: 1.5 mg/dL — ABNORMAL HIGH (ref 0.3–0.9)
Total Bilirubin: 1.8 mg/dL — ABNORMAL HIGH (ref 0.3–1.2)
Total Protein: 6.5 g/dL (ref 6.5–8.1)

## 2018-12-16 LAB — MAGNESIUM: Magnesium: 1.8 mg/dL (ref 1.7–2.4)

## 2018-12-16 LAB — T4, FREE: Free T4: 0.87 ng/dL (ref 0.82–1.77)

## 2018-12-16 LAB — I-STAT TROPONIN, ED: Troponin i, poc: 0 ng/mL (ref 0.00–0.08)

## 2018-12-16 LAB — CREATININE, SERUM
Creatinine, Ser: 0.98 mg/dL (ref 0.61–1.24)
GFR calc Af Amer: >60 mL/min (ref >60)
GFR calc non Af Amer: >60 mL/min (ref >60)

## 2018-12-16 LAB — PROTIME-INR
INR: 1.1 (ref 0.8–1.2)
Prothrombin Time: 13.6 seconds (ref 11.4–15.2)

## 2018-12-16 LAB — HEMOGLOBIN A1C
Hgb A1c MFr Bld: 5.8 % — ABNORMAL HIGH (ref 4.8–5.6)
Mean Plasma Glucose: 119.76 mg/dL

## 2018-12-16 LAB — TSH: TSH: 2.646 u[IU]/mL (ref 0.350–4.500)

## 2018-12-16 LAB — TROPONIN I
Troponin I: <0.03 ng/mL (ref <0.03)
Troponin I: <0.03 ng/mL (ref <0.03)

## 2018-12-16 MED ORDER — ASPIRIN 81 MG PO CHEW
324.0000 mg | CHEWABLE_TABLET | Freq: Once | ORAL | Status: AC
  Administered 2018-12-16: 324 mg via ORAL
  Filled 2018-12-16: qty 4

## 2018-12-16 MED ORDER — ASPIRIN EC 81 MG PO TBEC: 81.0000 mg | DELAYED_RELEASE_TABLET | Freq: Every day | ORAL | Status: DC

## 2018-12-16 MED ORDER — SODIUM CHLORIDE 0.9% FLUSH
3.0000 mL | Freq: Two times a day (BID) | INTRAVENOUS | Status: DC
  Administered 2018-12-16: 3 mL via INTRAVENOUS

## 2018-12-16 MED ORDER — TICAGRELOR 90 MG PO TABS
90.0000 mg | ORAL_TABLET | Freq: Two times a day (BID) | ORAL | Status: DC
  Administered 2018-12-16: 90 mg via ORAL
  Filled 2018-12-16: qty 1

## 2018-12-16 MED ORDER — ASPIRIN 300 MG RE SUPP: 300.0000 mg | RECTAL | Status: DC

## 2018-12-16 MED ORDER — ACETAMINOPHEN 325 MG PO TABS: 650.0000 mg | ORAL_TABLET | ORAL | Status: DC | PRN

## 2018-12-16 MED ORDER — ASPIRIN 81 MG PO CHEW: 324.0000 mg | CHEWABLE_TABLET | ORAL | Status: DC

## 2018-12-16 MED ORDER — NITROGLYCERIN 0.4 MG SL SUBL: 0.4000 mg | SUBLINGUAL_TABLET | SUBLINGUAL | Status: DC | PRN

## 2018-12-16 MED ORDER — ONDANSETRON HCL 4 MG/2ML IJ SOLN: 4.0000 mg | Freq: Four times a day (QID) | INTRAMUSCULAR | Status: DC | PRN

## 2018-12-16 MED ORDER — SODIUM CHLORIDE 0.9 % IV SOLN: 250.0000 mL | INTRAVENOUS | Status: DC | PRN

## 2018-12-16 MED ORDER — ZOLPIDEM TARTRATE 5 MG PO TABS: 5.0000 mg | ORAL_TABLET | Freq: Every evening | ORAL | Status: DC | PRN

## 2018-12-16 MED ORDER — ATORVASTATIN CALCIUM 80 MG PO TABS
80.0000 mg | ORAL_TABLET | Freq: Every evening | ORAL | Status: DC
  Administered 2018-12-16: 80 mg via ORAL
  Filled 2018-12-16: qty 1

## 2018-12-16 MED ORDER — ASPIRIN EC 81 MG PO TBEC
81.0000 mg | DELAYED_RELEASE_TABLET | Freq: Every day | ORAL | Status: DC
  Administered 2018-12-16 – 2018-12-17 (×2): 81 mg via ORAL
  Filled 2018-12-16 (×2): qty 1

## 2018-12-16 MED ORDER — SODIUM CHLORIDE 0.9% FLUSH: 3.0000 mL | INTRAVENOUS | Status: DC | PRN

## 2018-12-16 MED ORDER — ISOSORBIDE MONONITRATE ER 60 MG PO TB24
60.0000 mg | ORAL_TABLET | Freq: Every day | ORAL | Status: DC
  Administered 2018-12-17: 60 mg via ORAL
  Filled 2018-12-16: qty 1

## 2018-12-16 MED ORDER — HEPARIN SODIUM (PORCINE) 5000 UNIT/ML IJ SOLN
5000.0000 [IU] | Freq: Three times a day (TID) | INTRAMUSCULAR | Status: DC
  Administered 2018-12-16 – 2018-12-17 (×2): 5000 [IU] via SUBCUTANEOUS
  Filled 2018-12-16 (×2): qty 1

## 2018-12-16 MED ORDER — IOHEXOL 350 MG/ML SOLN
100.0000 mL | Freq: Once | INTRAVENOUS | Status: AC | PRN
  Administered 2018-12-16: 88 mL via INTRAVENOUS

## 2018-12-16 NOTE — ED Notes (Signed)
1st attempt to call report to 6e, RN to call back.

## 2018-12-16 NOTE — ED Notes (Addendum)
ED TO INPATIENT HANDOFF REPORT  ED Nurse Name and Phone #: Eddie Candle [phone]  S Name/Age/Gender Michael [name] 63 y.o. male Room/Bed: 025C/025C  Code Status   Code Status: Full Code  Home/SNF/Other Home Patient oriented to: self, place, time and situation Is this baseline? Yes   Triage Complete: Triage complete  Chief Complaint Chest Pain  Triage Note Pt had heart attack when 63. Reports numbness to right hand starting yesterday. Chest pain starting today with numbness in right hand continuing. Pain is central but was in his back earlier. Took nitro and improved. Reports some lightheadedness. Currently pain is 2/10. Pt reports tightness with deep inspiration    Allergies No Known Allergies  Level of Care/Admitting Diagnosis ED Disposition    ED Disposition Condition Comment   Admit  Hospital Area: [facility] [[id number]]  Level of Care: Telemetry Cardiac [103]  Diagnosis: Angina at rest [facility]) [[id number]]  Admitting Physician: [place]  Attending Physician: Lelon Perla [[id number]]  PT Class (Do Not Modify): Observation [104]  PT Acc Code (Do Not Modify): Observation [10022]       B Medical/Surgery History Past Medical History:  Diagnosis Date  . CAD S/P PCI to Cx & RCA    a. s/p PCI of LCx (2006) and ostium of RCA (2007) as well as DES to RCA in [place] (05/2014)  . HTN (hypertension)   . Hyperlipidemia    Past Surgical History:  Procedure Laterality Date  . CARDIAC CATHETERIZATION N/A 05/06/2016   Procedure: Left Heart Cath and Coronary Angiography;  Surgeon: Nelva Bush, MD;  Location: [facility];  Service: Cardiovascular;  Laterality: N/A;  . CARDIAC CATHETERIZATION N/A 05/06/2016   Procedure: Coronary Stent Intervention;  Surgeon: Nelva Bush, MD;  Location: [facility];  Service: Cardiovascular;  Laterality: N/A;  . CARDIAC CATHETERIZATION N/A 05/13/2016   Procedure: Coronary/Graft  Angiography;  Surgeon: Peter M Martinique, MD;  Location: [facility];  Service: Cardiovascular;  Laterality: N/A;  . CORONARY ANGIOPLASTY WITH STENT PLACEMENT  2006   in [place], occluded CFX, s/p Taxus stent  . CORONARY ANGIOPLASTY WITH STENT PLACEMENT  2007   in [place], ostial RCA stent  . CORONARY ANGIOPLASTY WITH STENT PLACEMENT  2015   in [place], RCA stent  . TONSILLECTOMY AND ADENOIDECTOMY        A IV Location/Drains/Wounds Patient Lines/Drains/Airways Status   Active Line/Drains/Airways    Name:   Placement date:   Placement time:   Site:   Days:   Peripheral IV 05/10/16 Left Forearm   05/10/16    0650    Forearm   950   Post Cath / Sheath 05/13/16 Right Arterial;Radial   05/13/16    1136    Arterial;Radial   947          Intake/Output Last 24 hours No intake or output data in the 24 hours ending 12/16/18 1755  Labs/Imaging Results for orders placed or performed during the hospital encounter of 12/16/18 (from the past 48 hour(s))  CBC     Status: None   Collection Time: 12/16/18 12:36 PM  Result Value Ref Range   WBC 7.0 4.0 - 10.5 K/uL   RBC 4.39 4.22 - 5.81 MIL/uL   Hemoglobin 14.3 13.0 - 17.0 g/dL   HCT 40.9 39.0 - 52.0 %   MCV 93.2 80.0 - 100.0 fL   MCH 32.6 26.0 - 34.0 pg   MCHC 35.0 30.0 - 36.0 g/dL  RDW 12.7 11.5 - 15.5 %   Platelets 200 150 - 400 K/uL   nRBC 0.0 0.0 - 0.2 %    Comment: Performed at [facility], [place] [address], [place], [place] [address]  Comprehensive metabolic panel     Status: Abnormal   Collection Time: 12/16/18 12:36 PM  Result Value Ref Range   Sodium 137 135 - 145 mmol/L   Potassium 3.8 3.5 - 5.1 mmol/L   Chloride 103 98 - 111 mmol/L   CO2 27 22 - 32 mmol/L   Glucose, Bld 117 (H) 70 - 99 mg/dL   BUN 23 8 - 23 mg/dL   Creatinine, Ser 1.06 0.61 - 1.24 mg/dL   Calcium 9.3 8.9 - 10.3 mg/dL   Total Protein 6.5 6.5 - 8.1 g/dL   Albumin 3.7 3.5 - 5.0 g/dL   AST 29 15 - 41 U/L   ALT 28 0 - 44 U/L    Alkaline Phosphatase 75 38 - 126 U/L   Total Bilirubin 1.7 (H) 0.3 - 1.2 mg/dL   GFR calc non Af Amer >60 >60 mL/min   GFR calc Af Amer >60 >60 mL/min   Anion gap 7 5 - 15    Comment: Performed at [facility] [address] [place], [place] [address]  I-Stat Troponin, ED (not at [facility])     Status: None   Collection Time: 12/16/18 12:50 PM  Result Value Ref Range   Troponin i, poc 0.00 0.00 - 0.08 ng/mL   Comment 3            Comment: Due to the release kinetics of cTnI, a negative result within the first hours of the onset of symptoms does not rule out myocardial infarction with certainty. If myocardial infarction is still suspected, repeat the test at appropriate intervals.   Troponin I - ONCE - STAT     Status: None   Collection Time: 12/16/18  3:48 PM  Result Value Ref Range   Troponin I <0.03 <0.03 ng/mL    Comment: Performed at [facility], [place] [address], [place], [place] [address]   Dg Chest 2 View  Result Date: 12/16/2018 CLINICAL DATA:  Onset chest pain at 8 a.m. this morning. EXAM: CHEST - 2 VIEW COMPARISON:  PA and lateral chest 04/23/2017 and 05/10/2016. FINDINGS: The lungs are clear. Heart size is normal. No pneumothorax or pleural fluid. No acute or focal bony abnormality. IMPRESSION: Negative chest. Electronically Signed   By: Inge Rise M.D.   On: 12/16/2018 13:01   Ct Angio Chest Pe W/cm &/or Wo Cm  Result Date: 12/16/2018 CLINICAL DATA:  Chest pain. EXAM: CT ANGIOGRAPHY CHEST WITH CONTRAST TECHNIQUE: Multidetector CT imaging of the chest was performed using the standard protocol during bolus administration of intravenous contrast. Multiplanar CT image reconstructions and MIPs were obtained to evaluate the vascular anatomy. CONTRAST:  38mL OMNIPAQUE IOHEXOL 350 MG/ML SOLN COMPARISON:  Radiographs of same day. FINDINGS: Cardiovascular: Satisfactory opacification of the pulmonary arteries to the segmental level. No evidence of pulmonary embolism.  Normal heart size. No pericardial effusion. No evidence of thoracic aortic dissection or aneurysm. Mediastinum/Nodes: No enlarged mediastinal, hilar, or axillary lymph nodes. Thyroid gland, trachea, and esophagus demonstrate no significant findings. Lungs/Pleura: Lungs are clear. No pleural effusion or pneumothorax. Upper Abdomen: No acute abnormality. Musculoskeletal: No chest wall abnormality. No acute or significant osseous findings. Review of the MIP images confirms the above findings. IMPRESSION: No definite evidence of pulmonary embolus.  No acute abnormality seen in the chest. Electronically Signed   By: Marijo Conception, M.D.   On: 12/16/2018 15:20    Pending Labs Unresulted Labs (From admission, onward)    Start     Ordered   12/17/18 2725  Basic metabolic panel  Tomorrow morning,   R     12/16/18 1724   12/17/18 0500  Lipid panel  Tomorrow morning,   R     12/16/18 1724   12/17/18 0500  CBC  Tomorrow morning,   R     12/16/18 1724   12/16/18 1724  Magnesium  Once,   R     12/16/18 1724   12/16/18 1724  Hepatic function panel  Once,   R     12/16/18 1724   12/16/18 1724  TSH  Once,   R     12/16/18 1724   12/16/18 1724  T4, free  Once,   R     12/16/18 1724   12/16/18 1724  Troponin I - Now Then Q6H  Now then every 6 hours,   STAT     12/16/18 1724   12/16/18 1724  Hemoglobin A1c  Once,   R     12/16/18 1724   12/16/18 1724  Protime-INR  ONCE - STAT,   STAT     12/16/18 1724   12/16/18 1722  HIV antibody (Routine Testing)  Once,   R     12/16/18 1724   12/16/18 1722  CBC  (heparin)  Once,   R    Comments:  Baseline for heparin therapy IF NOT ALREADY DRAWN.  Notify MD if PLT < 100 K.    12/16/18 1724   12/16/18 1722  Creatinine, serum  (heparin)  Once,   R    Comments:  Baseline for heparin therapy IF NOT ALREADY DRAWN.    12/16/18 1724          Vitals/Pain Today's Vitals   12/16/18 1543 12/16/18 1545 12/16/18 1600 12/16/18 1752  BP:  122/68 113/64   Pulse:  64 63    Resp:  12 10   Temp:      TempSrc:      SpO2:  99% 96%   Weight:      Height:      PainSc: 2    2     Isolation Precautions No active isolations  Medications Medications  nitroGLYCERIN (NITROSTAT) SL tablet 0.4 mg (has no administration in time range)  aspirin EC tablet 81 mg (has no administration in time range)  acetaminophen (TYLENOL) tablet 650 mg (has no administration in time range)  ondansetron (ZOFRAN) injection 4 mg (has no administration in time range)  heparin injection 5,000 Units (has no administration in time range)  sodium chloride flush (NS) 0.9 % injection 3 mL (has no administration in time range)  sodium chloride flush (NS) 0.9 % injection 3 mL (has no administration in time range)  0.9 %  sodium chloride infusion (has no administration in time range)  zolpidem (AMBIEN) tablet 5 mg (has no administration in time range)  aspirin chewable tablet 324 mg (324 mg Oral Given 12/16/18 1314)  iohexol (OMNIPAQUE) 350 MG/ML injection 100 mL (88 mLs Intravenous Contrast Given 12/16/18 1454)    Mobility walks Low fall risk   Focused Assessments Cardiac Assessment Handoff:  Cardiac Rhythm: Normal sinus rhythm Lab Results  Component Value Date   TROPONINI <0.03 12/16/2018   No results found for: DDIMER Does the Patient currently  have chest pain? Yes     R Recommendations: See Admitting Provider Note  Report given to:   Additional Notes: Pt with 2/10 CP right sided radiating to back and right arm. Hx 5 stents. No N/V, SOB, diaphoresis. Not in distress. Given 1 SL NTG en route to ER.  Given 324 chew asa in ER. A/O x 4, amb independently. 20 g left ac. Trops negative x 2. [place] consulted in er: Plan to monitor overnight, nuc ST in am.

## 2018-12-16 NOTE — ED Provider Notes (Signed)
Care assumed from A. Jerilee Hoh PA.  Please see her full H&P.  Currently second troponin is pending and waiting for cardiology to evaluate the patient. Pt will need possible admission for chest pain rule out.  In short,  Michael [name] is a 63 y.o. male presents for middle and right sided chest pain that radiated to his back and right arm x 1 day. Pt has history of NSTEMI and 5 stents.  Pt's chest pain was relieved today He has a HEART score of 5.    Physical Exam  BP 122/68   Pulse 64   Temp 97.6 F (36.4 C) (Oral)   Resp 12   Ht 5\' 7"  (1.702 m)   Wt 87.5 kg   SpO2 99%   BMI 30.23 kg/m   Physical Exam   PE: Constitutional: well-developed, well-nourished, no apparent distress HENT: normocephalic, atraumatic. no cervical adenopathy Cardiovascular: normal rate and rhythm, distal pulses intact Pulmonary/Chest: effort normal; breath sounds clear and equal bilaterally; no wheezes or rales Abdominal: soft and nontender Musculoskeletal: full ROM, no edema Neurological: alert with goal directed thinking Skin: warm and dry, no rash, no diaphoresis Psychiatric: normal mood and affect, normal behavior    ED Course/Procedures   Clinical Course as of Dec 16 1627  Thu Dec 16, 2018  1313 No cardiopulmonary disease noted on CXR. No pneumothorax or pleural fluid. No acute or focal bony abnormality.    DG Chest 2 View [AH]  4742 Patient reports chest pain has resolved while in the ER.   [AH]  1526 No definite evidence of pulmonary embolus. No acute abnormality seen in the chest.    CT Angio Chest PE W/Cm &/Or Wo Cm [AH]    Clinical Course User Index [AH] Arville Lime, PA-C      MDM  Delta troponin negative.  He had a negative chest x-ray for cardiopulmonary disease and negative CTA chest for PE and dissection.  Cardiology evaluated pt and recommend admission for further evaluation.  Pt continues to be chest pain free.    Cherre Robins, PA-C 12/17/18 0144     [name], MD 12/20/18 [phone]

## 2018-12-16 NOTE — Telephone Encounter (Signed)
New Message   Patients daughter states he's having back pain and needs to bring father in to have EKG.  Would like a triage nurse to call to see if they could come in today.  Advised we're not scheduling patients for after 4/13 but she still insists on coming in today for an EKG.

## 2018-12-16 NOTE — ED Notes (Signed)
ED Provider at bedside. 

## 2018-12-16 NOTE — ED Notes (Signed)
Pt's wife, Oshua Mcconaha, would like to be contacted with information regarding her husband. Her number is [phone].

## 2018-12-16 NOTE — ED Triage Notes (Addendum)
Pt had heart attack when 49. Reports numbness to right hand starting yesterday. Chest pain starting today with numbness in right hand continuing. Pain is central but was in his back earlier. Took nitro and improved. Reports some lightheadedness. Currently pain is 2/10. Pt reports tightness with deep inspiration

## 2018-12-16 NOTE — ED Provider Notes (Signed)
[facility] Provider Note   CSN: [id number] Arrival date & time: 12/16/18  1218    History   Chief Complaint Chief Complaint  Patient presents with  . Chest Pain    HPI Michael [name] is a 63 y.o. male with a PMH of HTN, HLD, and CAD s/p PCI to Cx and RCA presenting with constant middle/right sided chest pain radiates to his back and right arm onset 10am today. Patient states he had a similar episode last night and it was relieved with nitroglycerin. Patient describes pain as pressure and tightness. Patient states it is relieved with nitroglycerin and nothing makes it worse. Patient states he has had five stents in the past. Patient reports stent placement in 2006, 2007, 2015, and two stents in 2017. Patient reports associated shortness of breath and intermittent lightheadedness, but denies nausea, diaphoresis, or DOE. Patient reports he has been taking his medications as prescribed. Patient reports he takes Brillinta. Patient denies tobacco, alcohol, or drug use. Patient reports a family history of heart attacks including his maternal grandfather in his 30s and maternal uncle at age 30. Patient reports last cath was in 2017 and it revealed nonobstructive CAD except in the small diagonal branch with patent stents in the mid LAD.    HPI  Past Medical History:  Diagnosis Date  . CAD S/P PCI to Cx & RCA    a. s/p PCI of LCx (2006) and ostium of RCA (2007) as well as DES to RCA in [place] (05/2014)  . HTN (hypertension)   . Hyperlipidemia     Patient Active Problem List   Diagnosis Date Noted  . History of adenomatous polyp of colon 06/07/2018  . BPH (benign prostatic hyperplasia) 06/07/2018  . Vitiligo 06/07/2018  . Gluteal pain 04/10/2018  . Plantar fasciitis 10/12/2017  . Back pain 04/13/2017  . Nonallopathic lesion of thoracic region 04/13/2017  . Nonallopathic lesion of sacral region 04/13/2017  . Nonallopathic lesion of lumbosacral  region 04/13/2017  . Impaired glucose tolerance 11/03/2016  . Obesity   . HTN (hypertension)   . CAD S/P multiple PCIs 09/08/2014  . Dyslipidemia 09/08/2014    Past Surgical History:  Procedure Laterality Date  . CARDIAC CATHETERIZATION N/A 05/06/2016   Procedure: Left Heart Cath and Coronary Angiography;  Surgeon: Nelva Bush, MD;  Location: [facility];  Service: Cardiovascular;  Laterality: N/A;  . CARDIAC CATHETERIZATION N/A 05/06/2016   Procedure: Coronary Stent Intervention;  Surgeon: Nelva Bush, MD;  Location: [facility];  Service: Cardiovascular;  Laterality: N/A;  . CARDIAC CATHETERIZATION N/A 05/13/2016   Procedure: Coronary/Graft Angiography;  Surgeon: Peter M Martinique, MD;  Location: [facility];  Service: Cardiovascular;  Laterality: N/A;  . CORONARY ANGIOPLASTY WITH STENT PLACEMENT  2006   in [place], occluded CFX, s/p Taxus stent  . CORONARY ANGIOPLASTY WITH STENT PLACEMENT  2007   in [place], ostial RCA stent  . CORONARY ANGIOPLASTY WITH STENT PLACEMENT  2015   in [place], RCA stent  . TONSILLECTOMY AND ADENOIDECTOMY           Home Medications    Prior to Admission medications   Medication Sig Start Date End Date Taking? Authorizing Provider  aspirin 81 MG tablet Take 81 mg by mouth daily.   Yes [provider]  atorvastatin (LIPITOR) 80 MG tablet TAKE 1 TABLET DAILY Patient taking differently: Take 80 mg by mouth every evening.  05/31/18  Yes Minus Breeding, MD  Kary Kos  90 MG TABS tablet TAKE 1 TABLET TWICE A DAY Patient taking differently: Take 90 mg by mouth 2 (two) times daily.  12/09/18  Yes Minus Breeding, MD  CHERRY PO Take 50 mg by mouth daily. Cherry extract   Yes [provider]  Cinnamon 500 MG capsule Take 500 mg by mouth 2 (two) times daily.    Yes [provider]  isosorbide mononitrate (IMDUR) 60 MG 24 hr tablet Take 1 tablet (60 mg total) by mouth daily. 03/19/18  Yes Minus Breeding, MD  nitroGLYCERIN (NITROSTAT) 0.4 MG SL tablet DISSOLVE 1 TABLET UNDER THE TONGUE EVERY 5 MINUTES AS NEEDED FOR CHEST PAIN UP TO 3 DOSES Patient taking differently: Place 0.4 mg under the tongue every 5 (five) minutes as needed for chest pain.  06/28/18  Yes Hochrein, Jeneen Rinks, MD  Prasterone, DHEA, (DHEA 50 PO) Take 1 capsule by mouth daily. Take for 4 weeks, then off for 2 weeks   Yes [provider]  Turmeric 500 MG CAPS Take 500 mg by mouth daily.    Yes [provider]  Vitamin D, Ergocalciferol, (DRISDOL) 1.25 MG (50000 UT) CAPS capsule TAKE 1 CAPSULE EVERY 7 DAYS Patient taking differently: Take 50,000 Units by mouth every Sunday.  07/28/18  Yes [name], DO    Family History Family History  Problem Relation Age of Onset  . Hypertension Father   . Dementia Father        Vascular. patient states dementia/possible alzheimers as well  . Heart disease Mother        died in 52s  . Hypertension Sister   . Cancer Brother        type unknown  . Cancer Brother        type unknown  . Heart disease Maternal Grandfather 57       Died probably of heart diseasse  . Heart disease Maternal Uncle 63       Died of "heart exploding"  . Cerebral palsy Daughter   . Stroke Son        severe problems with blood clotting    Social History Social History   Tobacco Use  . Smoking status: Never Smoker  . Smokeless tobacco: Never Used  Substance Use Topics  . Alcohol use: No    Alcohol/week: 0.0 standard drinks  . Drug use: No     Allergies   Patient has no known allergies.   Review of Systems Review of Systems  Constitutional: Negative for activity change, appetite change, chills, diaphoresis, fatigue, fever and unexpected weight change.  HENT: Negative for congestion and rhinorrhea.   Eyes: Negative for visual disturbance.  Respiratory: Positive for chest tightness and shortness of breath. Negative for cough and wheezing.   Cardiovascular: Positive for  chest pain. Negative for palpitations and leg swelling.  Gastrointestinal: Negative for abdominal pain, nausea and vomiting.  Endocrine: Negative for cold intolerance and heat intolerance.  Musculoskeletal: Negative for back pain.  Skin: Negative for rash.  Neurological: Positive for light-headedness. Negative for dizziness, syncope, weakness and headaches.  Psychiatric/Behavioral: Negative for agitation and behavioral problems. The patient is not nervous/anxious.     Physical Exam Updated Vital Signs BP 115/63   Pulse 62   Temp 97.6 F (36.4 C) (Oral)   Resp 15   Ht 5\' 7"  (1.702 m)   Wt 87.5 kg   SpO2 97%   BMI 30.23 kg/m   Physical Exam Vitals signs and nursing note reviewed.  Constitutional:  General: He is not in acute distress.    Appearance: He is well-developed. He is not diaphoretic.  HENT:     Head: Normocephalic and atraumatic.  Neck:     Musculoskeletal: Normal range of motion and neck supple.     Vascular: No JVD.  Cardiovascular:     Rate and Rhythm: Normal rate and regular rhythm.     Pulses: Normal pulses.          Radial pulses are 2+ on the right side and 2+ on the left side.       Dorsalis pedis pulses are 2+ on the right side and 2+ on the left side.     Heart sounds: Normal heart sounds. No murmur. No friction rub. No gallop.   Pulmonary:     Effort: Pulmonary effort is normal. No respiratory distress.     Breath sounds: Normal breath sounds. No wheezing or rales.  Chest:     Chest wall: No tenderness.  Abdominal:     Palpations: Abdomen is soft.     Tenderness: There is no abdominal tenderness.  Musculoskeletal: Normal range of motion.     Right lower leg: He exhibits no tenderness. No edema.     Left lower leg: He exhibits no tenderness. No edema.  Skin:    General: Skin is warm.     Capillary Refill: Capillary refill takes less than 2 seconds.     Coloration: Skin is not pale.     Findings: No rash.  Neurological:     Mental Status: He  is alert and oriented to person, place, and time.      ED Treatments / Results  Labs (all labs ordered are listed, but only abnormal results are displayed) Labs Reviewed  COMPREHENSIVE METABOLIC PANEL - Abnormal; Notable for the following components:      Result Value   Glucose, Bld 117 (*)    Total Bilirubin 1.7 (*)    All other components within normal limits  CBC  I-STAT TROPONIN, ED    EKG EKG Interpretation  Date/Time:  Thursday December 16 2018 12:24:45 EDT Ventricular Rate:  70 PR Interval:    QRS Duration: 113 QT Interval:  412 QTC Calculation: 445 R Axis:   78 Text Interpretation:  Sinus rhythm Borderline intraventricular conduction delay Low voltage, precordial leads Confirmed by Quintella Reichert [phone]) on 12/16/2018 12:27:32 PM   Radiology Dg Chest 2 View  Result Date: 12/16/2018 CLINICAL DATA:  Onset chest pain at 8 a.m. this morning. EXAM: CHEST - 2 VIEW COMPARISON:  PA and lateral chest 04/23/2017 and 05/10/2016. FINDINGS: The lungs are clear. Heart size is normal. No pneumothorax or pleural fluid. No acute or focal bony abnormality. IMPRESSION: Negative chest. Electronically Signed   By: Inge Rise M.D.   On: 12/16/2018 13:01   Ct Angio Chest Pe W/cm &/or Wo Cm  Result Date: 12/16/2018 CLINICAL DATA:  Chest pain. EXAM: CT ANGIOGRAPHY CHEST WITH CONTRAST TECHNIQUE: Multidetector CT imaging of the chest was performed using the standard protocol during bolus administration of intravenous contrast. Multiplanar CT image reconstructions and MIPs were obtained to evaluate the vascular anatomy. CONTRAST:  24mL OMNIPAQUE IOHEXOL 350 MG/ML SOLN COMPARISON:  Radiographs of same day. FINDINGS: Cardiovascular: Satisfactory opacification of the pulmonary arteries to the segmental level. No evidence of pulmonary embolism. Normal heart size. No pericardial effusion. No evidence of thoracic aortic dissection or aneurysm. Mediastinum/Nodes: No enlarged mediastinal, hilar, or  axillary lymph nodes. Thyroid gland, trachea, and esophagus demonstrate no  significant findings. Lungs/Pleura: Lungs are clear. No pleural effusion or pneumothorax. Upper Abdomen: No acute abnormality. Musculoskeletal: No chest wall abnormality. No acute or significant osseous findings. Review of the MIP images confirms the above findings. IMPRESSION: No definite evidence of pulmonary embolus. No acute abnormality seen in the chest. Electronically Signed   By: Marijo Conception, M.D.   On: 12/16/2018 15:20    Procedures Procedures (including critical care time)  Medications Ordered in ED Medications  nitroGLYCERIN (NITROSTAT) SL tablet 0.4 mg (has no administration in time range)  aspirin chewable tablet 324 mg (324 mg Oral Given 12/16/18 1314)  iohexol (OMNIPAQUE) 350 MG/ML injection 100 mL (88 mLs Intravenous Contrast Given 12/16/18 1454)     Initial Impression / Assessment and Plan / ED Course  I have reviewed the triage vital signs and the nursing notes.  Pertinent labs & imaging results that were available during my care of the patient were reviewed by me and considered in my medical decision making (see chart for details).  Clinical Course as of Dec 16 1543  Thu Dec 16, 2018  1313 No cardiopulmonary disease noted on CXR. No pneumothorax or pleural fluid. No acute or focal bony abnormality.    DG Chest 2 View [AH]  9935 Patient reports chest pain has resolved while in the ER.   [AH]  1526 No definite evidence of pulmonary embolus. No acute abnormality seen in the chest.    CT Angio Chest PE W/Cm &/Or Wo Cm [AH]    Clinical Course User Index [AH] Arville Lime, PA-C      Patient presents with chest pain. Concern for cardiac etiology of Chest Pain. Patient has multiple risk factors. Symptoms improved with ASA while in the ER. Cardiology has been consulted. Pt has been re-evaluated prior to consult and VSS, NAD, heart RRR, pain 0/10, lungs CTAB. No acute abnormalities found on  EKG and first round of cardiac enzymes negative. CTA is negative. Heart score is 5. Cardiology reports they will evaluate patient. This case was discussed with Dr. Ralene Bathe who has seen the patient and agrees with plan. Cardiology recommendations are pending.   At shift change care was transferred to [facility], PA-C who will follow pending studies, re-evaluate and determine disposition.     Final Clinical Impressions(s) / ED Diagnoses   Final diagnoses:  Chest pain in adult    ED Discharge Orders    None       Arville Lime, [place] 12/16/18 1602    Quintella Reichert, MD 12/19/18 [phone]

## 2018-12-16 NOTE — H&P (Addendum)
Cardiology History and Physical   Patient ID: Michael [name] MRN: [id number]; DOB: [birth year]  Admit date: 12/16/2018 Date of Consult: 12/16/2018  Primary Care Provider: [name], MD Primary Cardiologist: Minus Breeding, MD  Primary Electrophysiologist:  None   Chief Complaint:  Chest pain   Patient Profile:   Michael [name] is a 63 y.o. male with a hx of CAD with DES stent 05/2005 to Lcx and 2007 stent to RCA, 2017 2 DES to LAD overlapping and re-look 2 weeks later for angina with stable CADwho is being seen today for the evaluation of Chest/back pain at the request of Dr. Ralene Bathe.  History of Present Illness:   Mr. Loughry with above hx and last cath 2017 developed back pain/chest pain and Rt shoulder pain with numbness in both hands. Took NTG and improved.  Discomfort began yesterday - and he has been having tenderness on Lt side which occurs before he ends up with new stent.  Mild SOB like he cannot pull in deep breath.  This again improves after cath.  NTG with relief both times.  No Nausea or vomiting.   EKG:  The EKG was personally reviewed and demonstrates:  SR without acute changes Telemetry:  Telemetry was personally reviewed and demonstrates:  SR Troponin 0.00, Na 137, K+ 3.8 Cr 1.06 LFTS normal.  T. Bili 1.7 Hgb 14.3, WBC 7.0 plts 200 CTA of chest No definite evidence of pulmonary embolus. No acute abnormality seen in the chest.  CXR neg  Past Medical History:  Diagnosis Date  . CAD S/P PCI to Cx & RCA    a. s/p PCI of LCx (2006) and ostium of RCA (2007) as well as DES to RCA in [place] (05/2014)  . HTN (hypertension)   . Hyperlipidemia     Past Surgical History:  Procedure Laterality Date  . CARDIAC CATHETERIZATION N/A 05/06/2016   Procedure: Left Heart Cath and Coronary Angiography;  Surgeon: Nelva Bush, MD;  Location: [place] [facility];  Service: Cardiovascular;  Laterality: N/A;  . CARDIAC CATHETERIZATION N/A 05/06/2016   Procedure:  Coronary Stent Intervention;  Surgeon: Nelva Bush, MD;  Location: [place] [facility];  Service: Cardiovascular;  Laterality: N/A;  . CARDIAC CATHETERIZATION N/A 05/13/2016   Procedure: Coronary/Graft Angiography;  Surgeon: Peter M Martinique, MD;  Location: [place] [facility];  Service: Cardiovascular;  Laterality: N/A;  . CORONARY ANGIOPLASTY WITH STENT PLACEMENT  2006   in [place], occluded CFX, s/p Taxus stent  . CORONARY ANGIOPLASTY WITH STENT PLACEMENT  2007   in [place], ostial RCA stent  . CORONARY ANGIOPLASTY WITH STENT PLACEMENT  2015   in [place], RCA stent  . TONSILLECTOMY AND ADENOIDECTOMY        Home Medications:  Prior to Admission medications   Medication Sig Start Date End Date Taking? Authorizing Provider  aspirin 81 MG tablet Take 81 mg by mouth daily.   Yes [provider]  atorvastatin (LIPITOR) 80 MG tablet TAKE 1 TABLET DAILY Patient taking differently: Take 80 mg by mouth every evening.  05/31/18  Yes Hochrein, Jeneen Rinks, MD  BRILINTA 90 MG TABS tablet TAKE 1 TABLET TWICE A DAY Patient taking differently: Take 90 mg by mouth 2 (two) times daily.  12/09/18  Yes Minus Breeding, MD  CHERRY PO Take 50 mg by mouth daily. Cherry extract   Yes [provider]  Cinnamon 500 MG capsule Take 500 mg by mouth 2 (two) times daily.    Yes [provider]  isosorbide  mononitrate (IMDUR) 60 MG 24 hr tablet Take 1 tablet (60 mg total) by mouth daily. 03/19/18  Yes Minus Breeding, MD  nitroGLYCERIN (NITROSTAT) 0.4 MG SL tablet DISSOLVE 1 TABLET UNDER THE TONGUE EVERY 5 MINUTES AS NEEDED FOR CHEST PAIN UP TO 3 DOSES Patient taking differently: Place 0.4 mg under the tongue every 5 (five) minutes as needed for chest pain.  06/28/18  Yes Hochrein, Jeneen Rinks, MD  Prasterone, DHEA, (DHEA 50 PO) Take 1 capsule by mouth daily. Take for 4 weeks, then off for 2 weeks   Yes [provider]  Turmeric 500 MG CAPS Take 500 mg by mouth daily.    Yes [provider]  Vitamin D, Ergocalciferol, (DRISDOL) 1.25 MG (50000 UT) CAPS capsule TAKE 1 CAPSULE EVERY 7 DAYS Patient taking differently: Take 50,000 Units by mouth every Sunday.  07/28/18  Yes [name], DO    Inpatient Medications: Scheduled Meds:  Continuous Infusions:  PRN Meds: nitroGLYCERIN  Allergies:   No Known Allergies  Social History:   Social History   Socioeconomic History  . Marital status: Married    Spouse name: Not on file  . Number of children: 4  . Years of education: Not on file  . Highest education level: Not on file  Occupational History  . Occupation: retired  Scientific laboratory technician  . Financial resource strain: Not on file  . Food insecurity:    Worry: Not on file    Inability: Not on file  . Transportation needs:    Medical: Not on file    Non-medical: Not on file  Tobacco Use  . Smoking status: Never Smoker  . Smokeless tobacco: Never Used  Substance and Sexual Activity  . Alcohol use: No    Alcohol/week: 0.0 standard drinks  . Drug use: No  . Sexual activity: Yes  Lifestyle  . Physical activity:    Days per week: Not on file    Minutes per session: Not on file  . Stress: Not on file  Relationships  . Social connections:    Talks on phone: Not on file    Gets together: Not on file    Attends religious service: Not on file    Active member of club or organization: Not on file    Attends meetings of clubs or organizations: Not on file    Relationship status: Not on file  . Intimate partner violence:    Fear of current or ex partner: Not on file    Emotionally abused: Not on file    Physically abused: Not on file    Forced sexual activity: Not on file  Other Topics Concern  . Not on file  Social History Narrative   Lives with wife.  4 kids total (2 step, 2 biological). 1 that has CP. Daughter Michael [name]- comes to [facility]        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      Hobbies: gardening, lawn care, care for  grandkids    Family History:    Family History  Problem Relation Age of Onset  . Hypertension Father   . Dementia Father        Vascular. patient states dementia/possible alzheimers as well  . Heart disease Mother        died in 49s  . Hypertension Sister   . Cancer Brother        type unknown  . Cancer Brother  type unknown  . Heart disease Maternal Grandfather 49       Died probably of heart diseasse  . Heart disease Maternal Uncle 63       Died of "heart exploding"  . Cerebral palsy Daughter   . Stroke Son        severe problems with blood clotting     ROS:  Please see the history of present illness.  General:no colds or fevers, no weight changes Skin:no rashes or ulcers HEENT:no blurred vision, no congestion CV:see HPI PUL:see HPI GI:no diarrhea constipation or melena, no indigestion GU:no hematuria, no dysuria MS:no joint pain, no claudication Neuro:no syncope, no lightheadedness Endo:no diabetes, no thyroid disease  All other ROS reviewed and negative.     Physical Exam/Data:   Vitals:   12/16/18 1230 12/16/18 1445 12/16/18 1515 12/16/18 1530  BP: 138/81 119/66 112/61 115/63  Pulse: 72 (!) 59 64 62  Resp: (!) 29 20 12 15   Temp:      TempSrc:      SpO2: 99% 98% 97% 97%  Weight:      Height:       No intake or output data in the 24 hours ending 12/16/18 1551 Last 3 Weights 12/16/2018 11/18/2018 09/29/2018  Weight (lbs) 193 lb 196 lb 199 lb 9.6 oz  Weight (kg) 87.544 kg 88.905 kg 90.538 kg     Body mass index is 30.23 kg/m.  General:  Well nourished, well developed, in no acute distress HEENT: normal Lymph: no adenopathy Neck: no JVD Endocrine:  No thryomegaly Vascular: No carotid bruits; pedal pulses 2+ -3+ bilaterally  Cardiac:  normal S1, S2; RRR; no murmur, gallup rub or click  Lungs:  clear to auscultation bilaterally, no wheezing, rhonchi or rales  Abd: soft, nontender, no hepatomegaly  Ext: no edema Musculoskeletal:  No deformities,  BUE and BLE strength normal and equal Skin: warm and dry  Neuro:  Alert and oriented X 3 MAE follows commands, no focal abnormalities noted Psych:  Normal affect    Relevant CV Studies: 05/13/16 cardiac cath  Ost RPDA lesion, 50 %stenosed.  Ost RCA to Prox RCA lesion, 0 %stenosed.  Mid RCA to Dist RCA lesion, 0 %stenosed.  Prox Cx to Mid Cx lesion, 0 %stenosed.  Mid Cx lesion, 40 %stenosed.  2nd Diag lesion, 70 %stenosed.  Dist LAD lesion, 0 %stenosed.   1. Nonobstructive CAD except for a small second diagonal branch. Stents in the mid LAD are widely patent. No loss of side branches.  Plan: continue medical management. Diagnostic  Dominance: Right    Intervention     Laboratory Data:  Chemistry Recent Labs  Lab 12/16/18 1236  NA 137  K 3.8  CL 103  CO2 27  GLUCOSE 117*  BUN 23  CREATININE 1.06  CALCIUM 9.3  GFRNONAA >60  GFRAA >60  ANIONGAP 7    Recent Labs  Lab 12/16/18 1236  PROT 6.5  ALBUMIN 3.7  AST 29  ALT 28  ALKPHOS 75  BILITOT 1.7*   Hematology Recent Labs  Lab 12/16/18 1236  WBC 7.0  RBC 4.39  HGB 14.3  HCT 40.9  MCV 93.2  MCH 32.6  MCHC 35.0  RDW 12.7  PLT 200   Cardiac EnzymesNo results for input(s): TROPONINI in the last 168 hours.  Recent Labs  Lab 12/16/18 1250  TROPIPOC 0.00    BNPNo results for input(s): BNP, PROBNP in the last 168 hours.  DDimer No results for input(s): DDIMER in the  last 168 hours.  Radiology/Studies:  Dg Chest 2 View  Result Date: 12/16/2018 CLINICAL DATA:  Onset chest pain at 8 a.m. this morning. EXAM: CHEST - 2 VIEW COMPARISON:  PA and lateral chest 04/23/2017 and 05/10/2016. FINDINGS: The lungs are clear. Heart size is normal. No pneumothorax or pleural fluid. No acute or focal bony abnormality. IMPRESSION: Negative chest. Electronically Signed   By: Inge Rise M.D.   On: 12/16/2018 13:01   Ct Angio Chest Pe W/cm &/or Wo Cm  Result Date: 12/16/2018 CLINICAL DATA:  Chest pain. EXAM:  CT ANGIOGRAPHY CHEST WITH CONTRAST TECHNIQUE: Multidetector CT imaging of the chest was performed using the standard protocol during bolus administration of intravenous contrast. Multiplanar CT image reconstructions and MIPs were obtained to evaluate the vascular anatomy. CONTRAST:  24mL OMNIPAQUE IOHEXOL 350 MG/ML SOLN COMPARISON:  Radiographs of same day. FINDINGS: Cardiovascular: Satisfactory opacification of the pulmonary arteries to the segmental level. No evidence of pulmonary embolism. Normal heart size. No pericardial effusion. No evidence of thoracic aortic dissection or aneurysm. Mediastinum/Nodes: No enlarged mediastinal, hilar, or axillary lymph nodes. Thyroid gland, trachea, and esophagus demonstrate no significant findings. Lungs/Pleura: Lungs are clear. No pleural effusion or pneumothorax. Upper Abdomen: No acute abnormality. Musculoskeletal: No chest wall abnormality. No acute or significant osseous findings. Review of the MIP images confirms the above findings. IMPRESSION: No definite evidence of pulmonary embolus. No acute abnormality seen in the chest. Electronically Signed   By: Marijo Conception, M.D.   On: 12/16/2018 15:20    Assessment and Plan:   1. Angina, resolves with NTG, no acute EKG changes and neg troponin, currently pain free.  Is on imdur at home.  DR. Stanford Breed to see, keep overnight and nuc in AM unless troponins +   2. CAD with hx of stents to RCA, LCX and LAD. On ASA and Brilinta and last cath 2017.   3. HLD treated on statin lipitor 80 4. HTN controlled       For questions or updates, please contact [place] Please consult [email or web address] for contact info under     Signed, Cecilie Kicks, NP  12/16/2018 3:51 PM As above, patient seen and examined.  Briefly he is a 63 year old male with past medical history of coronary artery disease with prior PCI, hypertension, hyperlipidemia with chest pain.  Patient states he awoke with left arm numbness yesterday followed  by a tightness in his chest for 2 hours.  Not pleuritic, positional or exertional.  No nausea, diaphoresis or dyspnea.  This morning he developed pain in his mid back radiating to his right shoulder and chest.  He describes the pain as tight.  He typically does not have exertional chest pain, dyspnea on exertion, orthopnea or PND.  Presently pain-free. Electrocardiogram shows sinus rhythm with no ST changes.  CTA shows no pulmonary embolus.  Initial troponin negative.  1 chest pain-symptoms are atypical.  He is very concerned.  We will admit and rule out myocardial infarction with serial enzymes.  If negative we will arrange a stress nuclear study tomorrow morning for risk stratification.  2 hypertension-blood pressure is controlled.  Continue present medications and follow.  3 hyperlipidemia-continue statin.  4 coronary artery disease-patient has not had PCI in the past year.  Would continue aspirin but discontinue Brilinta.  Continue statin.  Kirk Ruths, MD

## 2018-12-16 NOTE — Telephone Encounter (Signed)
Returned call to patient he stated he is presently in car going to [facility] ED.Stated he has been having chest pain,pain right shoulder blade.Numbness in both hands.Stated given his history he wanted to be checked.Trish notified.

## 2018-12-17 ENCOUNTER — Other Ambulatory Visit: Payer: Self-pay

## 2018-12-17 ENCOUNTER — Encounter (HOSPITAL_COMMUNITY): Payer: Self-pay | Admitting: General Practice

## 2018-12-17 ENCOUNTER — Observation Stay (HOSPITAL_BASED_OUTPATIENT_CLINIC_OR_DEPARTMENT_OTHER)

## 2018-12-17 DIAGNOSIS — I2510 Atherosclerotic heart disease of native coronary artery without angina pectoris: Secondary | ICD-10-CM | POA: Diagnosis not present

## 2018-12-17 DIAGNOSIS — I209 Angina pectoris, unspecified: Secondary | ICD-10-CM | POA: Diagnosis not present

## 2018-12-17 DIAGNOSIS — R079 Chest pain, unspecified: Secondary | ICD-10-CM

## 2018-12-17 DIAGNOSIS — I10 Essential (primary) hypertension: Secondary | ICD-10-CM | POA: Diagnosis not present

## 2018-12-17 DIAGNOSIS — E785 Hyperlipidemia, unspecified: Secondary | ICD-10-CM | POA: Diagnosis not present

## 2018-12-17 DIAGNOSIS — R072 Precordial pain: Secondary | ICD-10-CM | POA: Diagnosis not present

## 2018-12-17 LAB — CBC
HCT: 39.8 % (ref 39.0–52.0)
Hemoglobin: 14.2 g/dL (ref 13.0–17.0)
MCH: 32.5 pg (ref 26.0–34.0)
MCHC: 35.7 g/dL (ref 30.0–36.0)
MCV: 91.1 fL (ref 80.0–100.0)
Platelets: 201 10*3/uL (ref 150–400)
RBC: 4.37 MIL/uL (ref 4.22–5.81)
RDW: 12.8 % (ref 11.5–15.5)
WBC: 6.6 10*3/uL (ref 4.0–10.5)
nRBC: 0 % (ref 0.0–0.2)

## 2018-12-17 LAB — BASIC METABOLIC PANEL
Anion gap: 10 (ref 5–15)
BUN: 18 mg/dL (ref 8–23)
CO2: 26 mmol/L (ref 22–32)
Calcium: 9.3 mg/dL (ref 8.9–10.3)
Chloride: 104 mmol/L (ref 98–111)
Creatinine, Ser: 1.07 mg/dL (ref 0.61–1.24)
GFR calc Af Amer: >60 mL/min (ref >60)
GFR calc non Af Amer: >60 mL/min (ref >60)
Glucose, Bld: 98 mg/dL (ref 70–99)
Potassium: 3.8 mmol/L (ref 3.5–5.1)
Sodium: 140 mmol/L (ref 135–145)

## 2018-12-17 LAB — ECHOCARDIOGRAM LIMITED
Height: 67 in
Weight: 3075.2 oz

## 2018-12-17 LAB — HIV ANTIBODY (ROUTINE TESTING W REFLEX): HIV Screen 4th Generation wRfx: NONREACTIVE

## 2018-12-17 LAB — LIPID PANEL
Cholesterol: 108 mg/dL (ref 0–200)
HDL: 43 mg/dL (ref >40)
LDL Cholesterol: 48 mg/dL (ref 0–99)
Total CHOL/HDL Ratio: 2.5 RATIO
Triglycerides: 85 mg/dL (ref <150)
VLDL: 17 mg/dL (ref 0–40)

## 2018-12-17 LAB — TROPONIN I: Troponin I: <0.03 ng/mL (ref <0.03)

## 2018-12-17 MED ORDER — PERFLUTREN LIPID MICROSPHERE
4.0000 mL | Freq: Once | INTRAVENOUS | Status: AC
  Administered 2018-12-17: 4 mL via INTRAVENOUS

## 2018-12-17 MED ORDER — TECHNETIUM TC 99M TETROFOSMIN IV KIT
30.0000 | PACK | Freq: Once | INTRAVENOUS | Status: AC | PRN
  Administered 2018-12-17: 30 via INTRAVENOUS

## 2018-12-17 MED ORDER — TECHNETIUM TC 99M TETROFOSMIN IV KIT
10.0000 | PACK | Freq: Once | INTRAVENOUS | Status: AC | PRN
  Administered 2018-12-17: 10 via INTRAVENOUS

## 2018-12-17 MED ORDER — REGADENOSON 0.4 MG/5ML IV SOLN
INTRAVENOUS | Status: AC
  Filled 2018-12-17: qty 5

## 2018-12-17 MED ORDER — REGADENOSON 0.4 MG/5ML IV SOLN
0.4000 mg | Freq: Once | INTRAVENOUS | Status: AC
  Administered 2018-12-17: 0.4 mg via INTRAVENOUS
  Filled 2018-12-17: qty 5

## 2018-12-17 NOTE — Discharge Summary (Signed)
Discharge Summary    Patient ID: Antione Obar MRN: [id number]; DOB: 1956/06/14  Admit date: 12/16/2018 Discharge date: 12/17/2018  Primary Care Provider: [name], MD  Primary Cardiologist: Minus Breeding, MD   Discharge Diagnoses    Principal Problem:   Angina at rest [facility]) Active Problems:   CAD S/P multiple PCIs   Dyslipidemia   HTN (hypertension)   Obesity  Allergies No Known Allergies  Diagnostic Studies/Procedures    Lexiscan myoview 12/17/18:  No T wave inversion was noted during stress.  There was no ST segment deviation noted during stress.  Defect 1: There is a large defect of severe severity.  Findings consistent with prior myocardial infarction.  This is an intermediate risk study.   Large size, severe severity fixed (SDS 1) inferior and inferolateral perfusion defect suggestive of scar. LVEF 38% with inferior and lateral akinesis. This is an intermediate risk study.   Echo limited 12/17/18: Final read pending     History of Present Illness     Michael [name] is a 63 y.o. male with a hx of CAD with DES stent 05/2005 to LCX and 2007 stent to RCA, 2017 2 DES to LAD overlapping and re-look 2 weeks later for angina with stable CAD who was seen by cardiology for the evaluation of chest/back pain at the request of Dr. Ralene Bathe.  Mr. Rash has the above history. Patient stated that he awoke with left arm numbness one day prior to admission followed by a tightness in his chest for 2 hours. Not pleuritic, positional or exertional.  No nausea, diaphoresis or dyspnea. On day of admission, he developed pain in his mid back radiating to his right shoulder and chest. He described the pain as tight. He typically does not have exertional chest pain, dyspnea on exertion, orthopnea or PND.   Electrocardiogram showed sinus rhythm with no ST changes.  CTA shows no pulmonary embolus. Initial troponin negative.  In the ED, EKG with SR without acute changes.  Troponin 0.00, Na 137, K+ 3.8, Cr 1.06, LFTS normal, T. Bili 1.7, Hgb 14.3, WBC 7.0 plts 200 CTA of chest with no definite evidence of pulmonary embolus. No acute abnormality seen on CXR.   Hospital Course     Given the above symptoms, he was admitted to Cardiology service to rule out MI. His troponin levels remained negative and he was therefore scheduled for a Lexiscan stress test for risk stratification that was performed today, 12/17/2018. This showed inferior and inferolateral perfusion defect suggestive of scar. LVEF 38% with inferior and lateral akinesis. Discussed with Dr. Stanford Breed and obtained a limited echo to evaluate wall motion abnormality. This was reviewed with Dr. Marlou Porch and Dr. Stanford Breed and felt to be similar to previous studies. No further ischemic evaluation was warranted.  Continue ASA and statin. Continue Imdur 60mg  daily.    Other hospital concerns:  -Hypertension: Stable, 98/64>115/57>114/67>108/66 -Not currently on antihypertensive therapy -Continue Imdur 60mg  daily   -Hyperlipidemia: LDL, 48 today, 12/17/2018 -Continue statin therapy   Consultants: None     The patient was seen and examined by Dr. Stanford Breed who feel that the patient is stable and ready for discharge after stress test performed if stable and low risk study.   _____________  Discharge Vitals Blood pressure 119/65, pulse 66, temperature (!) 97.5 F (36.4 C), temperature source Oral, resp. rate 10, height 5\' 7"  (1.702 m), weight 87.2 kg, SpO2 100 %.  Filed Weights   12/16/18 1227 12/16/18 1833 12/17/18 0501  Weight: 87.5 kg  87.6 kg 87.2 kg   Labs & Radiologic Studies    CBC Recent Labs    12/16/18 1825 12/17/18 0520  WBC 7.3 6.6  HGB 14.0 14.2  HCT 41.4 39.8  MCV 93.5 91.1  PLT 194 166   Basic Metabolic Panel Recent Labs    12/16/18 1236 12/16/18 1825 12/17/18 0520  NA 137  --  140  K 3.8  --  3.8  CL 103  --  104  CO2 27  --  26  GLUCOSE 117*  --  98  BUN 23  --  18   CREATININE 1.06 0.98 1.07  CALCIUM 9.3  --  9.3  MG  --  1.8  --    Liver Function Tests Recent Labs    12/16/18 1236 12/16/18 1825  AST 29 27  ALT 28 28  ALKPHOS 75 70  BILITOT 1.7* 1.8*  PROT 6.5 6.5  ALBUMIN 3.7 3.6   No results for input(s): LIPASE, AMYLASE in the last 72 hours. Cardiac Enzymes Recent Labs    12/16/18 1548 12/16/18 1825 12/17/18 0520  TROPONINI <0.03 <0.03 <0.03   Hemoglobin A1C Recent Labs    12/16/18 1825  HGBA1C 5.8*   Fasting Lipid Panel Recent Labs    12/17/18 0520  CHOL 108  HDL 43  LDLCALC 48  TRIG 85  CHOLHDL 2.5   Thyroid Function Tests Recent Labs    12/16/18 1825  TSH 2.646   _____________  Dg Chest 2 View  Result Date: 12/16/2018 CLINICAL DATA:  Onset chest pain at 8 a.m. this morning. EXAM: CHEST - 2 VIEW COMPARISON:  PA and lateral chest 04/23/2017 and 05/10/2016. FINDINGS: The lungs are clear. Heart size is normal. No pneumothorax or pleural fluid. No acute or focal bony abnormality. IMPRESSION: Negative chest. Electronically Signed   By: Inge Rise M.D.   On: 12/16/2018 13:01   Ct Angio Chest Pe W/cm &/or Wo Cm  Result Date: 12/16/2018 CLINICAL DATA:  Chest pain. EXAM: CT ANGIOGRAPHY CHEST WITH CONTRAST TECHNIQUE: Multidetector CT imaging of the chest was performed using the standard protocol during bolus administration of intravenous contrast. Multiplanar CT image reconstructions and MIPs were obtained to evaluate the vascular anatomy. CONTRAST:  81mL OMNIPAQUE IOHEXOL 350 MG/ML SOLN COMPARISON:  Radiographs of same day. FINDINGS: Cardiovascular: Satisfactory opacification of the pulmonary arteries to the segmental level. No evidence of pulmonary embolism. Normal heart size. No pericardial effusion. No evidence of thoracic aortic dissection or aneurysm. Mediastinum/Nodes: No enlarged mediastinal, hilar, or axillary lymph nodes. Thyroid gland, trachea, and esophagus demonstrate no significant findings. Lungs/Pleura:  Lungs are clear. No pleural effusion or pneumothorax. Upper Abdomen: No acute abnormality. Musculoskeletal: No chest wall abnormality. No acute or significant osseous findings. Review of the MIP images confirms the above findings. IMPRESSION: No definite evidence of pulmonary embolus. No acute abnormality seen in the chest. Electronically Signed   By: Marijo Conception, M.D.   On: 12/16/2018 15:20   Nm Myocar Multi W/spect W/wall Motion / Ef  Result Date: 12/17/2018  No T wave inversion was noted during stress.  There was no ST segment deviation noted during stress.  Defect 1: There is a large defect of severe severity.  Findings consistent with prior myocardial infarction.  This is an intermediate risk study.  Large size, severe severity fixed (SDS 1) inferior and inferolateral perfusion defect suggestive of scar. LVEF 38% with inferior and lateral akinesis. This is an intermediate risk study.   Disposition   Pt is  being discharged home today in good condition.  Follow-up Plans & Appointments    Follow-up Information    Almyra Deforest, [place] Follow up on 01/11/2019.   Specialties:  Cardiology, Radiology Why:  10:30 am - may be telehealth visit Contact information: [address] [place] [address] [phone]          Discharge Instructions    Diet - low sodium heart healthy   Complete by:  As directed    Increase activity slowly   Complete by:  As directed       Discharge Medications   Allergies as of 12/17/2018   No Known Allergies     Medication List    TAKE these medications   aspirin 81 MG tablet Take 81 mg by mouth daily.   atorvastatin 80 MG tablet Commonly known as:  LIPITOR TAKE 1 TABLET DAILY What changed:    how much to take  how to take this  when to take this  additional instructions   Brilinta 90 MG Tabs tablet Generic drug:  ticagrelor TAKE 1 TABLET TWICE A DAY What changed:  how much to take   CHERRY PO Take 50 mg by mouth  daily. Cherry extract   Cinnamon 500 MG capsule Take 500 mg by mouth 2 (two) times daily.   DHEA 50 PO Take 1 capsule by mouth daily. Take for 4 weeks, then off for 2 weeks   isosorbide mononitrate 60 MG 24 hr tablet Commonly known as:  IMDUR Take 1 tablet (60 mg total) by mouth daily.   nitroGLYCERIN 0.4 MG SL tablet Commonly known as:  NITROSTAT DISSOLVE 1 TABLET UNDER THE TONGUE EVERY 5 MINUTES AS NEEDED FOR CHEST PAIN UP TO 3 DOSES What changed:  See the new instructions.   Turmeric 500 MG Caps Take 500 mg by mouth daily.   Vitamin D (Ergocalciferol) 1.25 MG (50000 UT) Caps capsule Commonly known as:  DRISDOL TAKE 1 CAPSULE EVERY 7 DAYS What changed:  See the new instructions.        Acute coronary syndrome (MI, NSTEMI, STEMI, etc) this admission?: No.    Outstanding Labs/Studies     Duration of Discharge Encounter   Greater than 30 minutes including physician time.  Signed, Tami Lin Logen Fowle, PA 12/17/2018, 3:15 PM

## 2018-12-17 NOTE — Progress Notes (Signed)
Progress Note  Patient Name: Michael [name] Date of Encounter: 12/17/2018  Primary Cardiologist: Minus Breeding, MD   Subjective   No CP or dyspnea  Inpatient Medications    Scheduled Meds: . aspirin EC  81 mg Oral Daily  . atorvastatin  80 mg Oral QPM  . heparin  5,000 Units Subcutaneous Q8H  . isosorbide mononitrate  60 mg Oral Daily  . sodium chloride flush  3 mL Intravenous Q12H  . ticagrelor  90 mg Oral BID   Continuous Infusions: . sodium chloride     PRN Meds: sodium chloride, acetaminophen, nitroGLYCERIN, ondansetron (ZOFRAN) IV, sodium chloride flush, zolpidem   Vital Signs    Vitals:   12/16/18 1833 12/16/18 2201 12/17/18 0501 12/17/18 0502  BP: 131/69 (!) 127/58  115/72  Pulse: 67 66  (!) 58  Resp: 10     Temp: 97.6 F (36.4 C) 98 F (36.7 C)  98.1 F (36.7 C)  TempSrc: Oral Oral  Oral  SpO2: 98% 94%  95%  Weight: 87.6 kg  87.2 kg   Height: 5\' 7"  (1.702 m)       Intake/Output Summary (Last 24 hours) at 12/17/2018 0745 Last data filed at 12/16/2018 2136 Gross per 24 hour  Intake 3 ml  Output -  Net 3 ml   Last 3 Weights 12/17/2018 12/16/2018 12/16/2018  Weight (lbs) 192 lb 3.2 oz 193 lb 3.2 oz 193 lb  Weight (kg) 87.181 kg 87.635 kg 87.544 kg      Telemetry    Sinus- Personally Reviewed   Physical Exam   GEN: No acute distress.   Neck: No JVD Cardiac: RRR, no murmurs, rubs, or gallops.  Respiratory: Clear to auscultation bilaterally. GI: Soft, nontender, non-distended  MS: No edema Neuro:  Nonfocal  Psych: Normal affect   Labs    Chemistry Recent Labs  Lab 12/16/18 1236 12/16/18 1825 12/17/18 0520  NA 137  --  140  K 3.8  --  3.8  CL 103  --  104  CO2 27  --  26  GLUCOSE 117*  --  98  BUN 23  --  18  CREATININE 1.06 0.98 1.07  CALCIUM 9.3  --  9.3  PROT 6.5 6.5  --   ALBUMIN 3.7 3.6  --   AST 29 27  --   ALT 28 28  --   ALKPHOS 75 70  --   BILITOT 1.7* 1.8*  --   GFRNONAA >60 >60 >60  GFRAA >60 >60 >60   ANIONGAP 7  --  10     Hematology Recent Labs  Lab 12/16/18 1236 12/16/18 1825 12/17/18 0520  WBC 7.0 7.3 6.6  RBC 4.39 4.43 4.37  HGB 14.3 14.0 14.2  HCT 40.9 41.4 39.8  MCV 93.2 93.5 91.1  MCH 32.6 31.6 32.5  MCHC 35.0 33.8 35.7  RDW 12.7 12.7 12.8  PLT 200 194 201    Cardiac Enzymes Recent Labs  Lab 12/16/18 1548 12/16/18 1825 12/17/18 0520  TROPONINI <0.03 <0.03 <0.03    Recent Labs  Lab 12/16/18 1250  TROPIPOC 0.00     Radiology    Dg Chest 2 View  Result Date: 12/16/2018 CLINICAL DATA:  Onset chest pain at 8 a.m. this morning. EXAM: CHEST - 2 VIEW COMPARISON:  PA and lateral chest 04/23/2017 and 05/10/2016. FINDINGS: The lungs are clear. Heart size is normal. No pneumothorax or pleural fluid. No acute or focal bony abnormality. IMPRESSION: Negative chest. Electronically Signed  By: Inge Rise M.D.   On: 12/16/2018 13:01   Ct Angio Chest Pe W/cm &/or Wo Cm  Result Date: 12/16/2018 CLINICAL DATA:  Chest pain. EXAM: CT ANGIOGRAPHY CHEST WITH CONTRAST TECHNIQUE: Multidetector CT imaging of the chest was performed using the standard protocol during bolus administration of intravenous contrast. Multiplanar CT image reconstructions and MIPs were obtained to evaluate the vascular anatomy. CONTRAST:  42mL OMNIPAQUE IOHEXOL 350 MG/ML SOLN COMPARISON:  Radiographs of same day. FINDINGS: Cardiovascular: Satisfactory opacification of the pulmonary arteries to the segmental level. No evidence of pulmonary embolism. Normal heart size. No pericardial effusion. No evidence of thoracic aortic dissection or aneurysm. Mediastinum/Nodes: No enlarged mediastinal, hilar, or axillary lymph nodes. Thyroid gland, trachea, and esophagus demonstrate no significant findings. Lungs/Pleura: Lungs are clear. No pleural effusion or pneumothorax. Upper Abdomen: No acute abnormality. Musculoskeletal: No chest wall abnormality. No acute or significant osseous findings. Review of the MIP images  confirms the above findings. IMPRESSION: No definite evidence of pulmonary embolus. No acute abnormality seen in the chest. Electronically Signed   By: Marijo Conception, M.D.   On: 12/16/2018 15:20    Patient Profile     63 year old male with past medical history of coronary artery disease with prior PCI, hypertension, hyperlipidemia with chest pain.    Assessment & Plan    1 chest pain-symptoms are atypical.  Enzymes negative.  For stress nuclear study today.  If negative will discharge home.  2 coronary artery disease-continue aspirin and statin.  Discontinue Brilinta.  3 hypertension-patient's blood pressure is controlled.  4 hyperlipidemia-continue statin.  For questions or updates, please contact [place] Please consult [email or web address] for contact info under    Signed, Kirk Ruths, MD  12/17/2018, 7:45 AM

## 2018-12-17 NOTE — Progress Notes (Signed)
Lexiscan portion of test completed. Patient in no distress.  

## 2018-12-17 NOTE — Discharge Instructions (Signed)
YOUR CARDIOLOGY TEAM HAS ARRANGED FOR AN E-VISIT FOR YOUR APPOINTMENT - PLEASE REVIEW IMPORTANT INFORMATION BELOW SEVERAL DAYS PRIOR TO YOUR APPOINTMENT ° °Due to the recent COVID-19 pandemic, we are transitioning in-person office visits to tele-medicine visits in an effort to decrease unnecessary exposure to our patients and staff. Medicare and most insurances are covering these visits without a copay needed. You will need a smartphone if possible. For patients that do not have these items, we can still complete the visit using a telephone but do prefer a smartphone to enable video when possible. You may have a close family member that can help. If possible, we also ask that you have a blood pressure cuff and scale at home to measure your blood pressure, heart rate and weight prior to your scheduled appointment. Patients with clinical needs that need an in-person evaluation and testing will still be able to come to the office if absolutely necessary. If you have any questions, feel free to call our office.  ° ° ° °DOWNLOADING THE WEBEX SOFTWARE TO SMARTPHONE ° °- If Apple, go to App Store and type in WebEx in the search bar. Download [employer] Webex Meetings, the blue/green circle. The app is free but as with any other app download, your phone may require you to verify saved payment information or Apple password. You do NOT have to create a WebEx account. ° °- If Android, go to Google Play Store and type in WebEx in the search bar. Download [employer] Webex Meetings, the blue/green circle. The app is free but as with any other app download, your phone may require you to verify saved payment information or Android password. You do NOT have to create a WebEx account. ° °It is very helpful to have this downloaded before your visit. ° ° ° °2-3 DAYS BEFORE YOUR APPOINTMENT ° °You will receive a telephone call from one of our [facility] team members - your caller ID may say "Unknown caller." If this is a video visit, we will  confirm that you have been able to download the WebEx app. We will remind you check your blood pressure, heart rate and weight prior to your scheduled appointment. If you have an Apple Watch or Kardia, please upload any pertinent ECG strips the day before or morning of your appointment to MyChart. Our staff will also make sure you have reviewed the consent and agree to move forward with your scheduled tele-health visit.  ° ° ° °THE DAY OF YOUR APPOINTMENT ° °Approximately 15 minutes prior to your scheduled appointment, you will receive a telephone call from one of Heart[facility] team - your caller ID may say "Unknown caller."  Our staff will confirm medications, vital signs for the day and any symptoms you may be experiencing. Please have this information available prior to the time of visit start. It may also be helpful for you to have a pad of paper and pen handy for any instructions given during your visit. They will also walk you through joining the WebEx smartphone meeting if this is a video visit. ° ° ° °CONSENT FOR TELE-HEALTH VISIT - PLEASE RVIEW ° °I hereby voluntarily request, consent and authorize [facility] and its employed or contracted physicians, physician assistants, nurse practitioners or other licensed health care professionals (the Practitioner), to provide me with telemedicine health care services (the “Services") as deemed necessary by the treating Practitioner. I acknowledge and consent to receive the Services by the Practitioner via telemedicine. I understand that the telemedicine visit will   involve communicating with the Practitioner through live audiovisual communication technology and the disclosure of certain medical information by electronic transmission. I acknowledge that I have been given the opportunity to request an in-person assessment or other available alternative prior to the telemedicine visit and am voluntarily participating in the telemedicine visit. ° °I understand that I have  the right to withhold or withdraw my consent to the use of telemedicine in the course of my care at any time, without affecting my right to future care or treatment, and that the Practitioner or I may terminate the telemedicine visit at any time. I understand that I have the right to inspect all information obtained and/or recorded in the course of the telemedicine visit and may receive copies of available information for a reasonable fee.  I understand that some of the potential risks of receiving the Services via telemedicine include:  °• Delay or interruption in medical evaluation due to technological equipment failure or disruption; °• Information transmitted may not be sufficient (e.g. poor resolution of images) to allow for appropriate medical decision making by the Practitioner; and/or  °• In rare instances, security protocols could fail, causing a breach of personal health information. ° °Furthermore, I acknowledge that it is my responsibility to provide information about my medical history, conditions and care that is complete and accurate to the best of my ability. I acknowledge that Practitioner's advice, recommendations, and/or decision may be based on factors not within their control, such as incomplete or inaccurate data provided by me or distortions of diagnostic images or specimens that may result from electronic transmissions. I understand that the practice of medicine is not an exact science and that Practitioner makes no warranties or guarantees regarding treatment outcomes. I acknowledge that I will receive a copy of this consent concurrently upon execution via email to the email address I last provided but may also request a printed copy by calling the office of [facility].   ° °I understand that my insurance will be billed for this visit.  ° °I have read or had this consent read to me. °• I understand the contents of this consent, which adequately explains the benefits and risks of the  Services being provided via telemedicine.  °• I have been provided ample opportunity to ask questions regarding this consent and the Services and have had my questions answered to my satisfaction. °• I give my informed consent for the services to be provided through the use of telemedicine in my medical care ° °By participating in this telemedicine visit I agree to the above. ° °

## 2018-12-17 NOTE — Progress Notes (Signed)
  Echocardiogram 2D Echocardiogram has been performed.  Michael [name] 12/17/2018, 1:58 PM

## 2018-12-17 NOTE — Telephone Encounter (Signed)
Pt is currently in the hospital. 

## 2018-12-23 ENCOUNTER — Telehealth: Payer: Self-pay

## 2018-12-23 NOTE — Telephone Encounter (Signed)
Spoke with patient. He would like to get OMT from Dr. Tamala Julian. Scheduled in May as Dr. Tamala Julian is not performing OMT at this time.

## 2018-12-23 NOTE — Telephone Encounter (Signed)
Copied from [place] [id number]. Topic: Appointment Scheduling - Scheduling Inquiry for Clinic >> Dec 23, 2018  2:19 PM Rayann Heman wrote: Reason for CRM: pt called to schedule an appointment for back pain with dr Tamala Julian. Please advise

## 2019-01-03 ENCOUNTER — Telehealth: Payer: Self-pay | Admitting: Physician Assistant

## 2019-01-03 NOTE — Telephone Encounter (Signed)
Mychart, smartphone, pre reg complete 01/03/19 AF

## 2019-01-08 ENCOUNTER — Other Ambulatory Visit: Payer: Self-pay | Admitting: Cardiology

## 2019-01-10 NOTE — Telephone Encounter (Signed)
Atorvastatin 80 mg and isosorbide 60 mg refilled.

## 2019-01-11 ENCOUNTER — Encounter: Payer: Self-pay | Admitting: Physician Assistant

## 2019-01-11 ENCOUNTER — Telehealth (INDEPENDENT_AMBULATORY_CARE_PROVIDER_SITE_OTHER): Admitting: Physician Assistant

## 2019-01-11 ENCOUNTER — Telehealth: Payer: Self-pay

## 2019-01-11 DIAGNOSIS — I10 Essential (primary) hypertension: Secondary | ICD-10-CM

## 2019-01-11 DIAGNOSIS — E785 Hyperlipidemia, unspecified: Secondary | ICD-10-CM

## 2019-01-11 DIAGNOSIS — I2510 Atherosclerotic heart disease of native coronary artery without angina pectoris: Secondary | ICD-10-CM

## 2019-01-11 DIAGNOSIS — R079 Chest pain, unspecified: Secondary | ICD-10-CM

## 2019-01-11 NOTE — Patient Instructions (Signed)
Medication Instructions:   Your physician recommends that you continue on your current medications as directed. Please refer to the Current Medication list given to you today.  If you need a refill on your cardiac medications before your next appointment, please call your pharmacy.   Lab work:  None ORDERED AT THIS TIME OF APPOINTMENT   If you have labs (blood work) drawn today and your tests are completely normal, you will receive your results only by: [name] MyChart Message (if you have MyChart) OR . A paper copy in the mail If you have any lab test that is abnormal or we need to change your treatment, we will call you to review the results.  Testing/Procedures:  NONE ORDERED AT THIS TIME OF APPOINTMENT   Follow-Up: At [facility], you and your health needs are our priority.  As part of our continuing mission to provide you with exceptional heart care, we have created designated Provider Care Teams.  These Care Teams include your primary Cardiologist (physician) and Advanced Practice Providers (APPs -  Physician Assistants and Nurse Practitioners) who all work together to provide you with the care you need, when you need it. You will need a follow up appointment in 4-5 months.  Please call our office 2 months in advance to schedule this appointment.  You may see Minus Breeding, MD or one of the following Advanced Practice Providers on your designated Care Team:   Rosaria Ferries, PA-C . Jory Sims, DNP, ANP  Any Other Special Instructions Will Be Listed Below (If Applicable).

## 2019-01-11 NOTE — Progress Notes (Signed)
Virtual Visit via Telephone Note   This visit type was conducted due to national recommendations for restrictions regarding the COVID-19 Pandemic (e.g. social distancing) in an effort to limit this patient's exposure and mitigate transmission in our community.  Due to his co-morbid illnesses, this patient is at least at moderate risk for complications without adequate follow up.  This format is felt to be most appropriate for this patient at this time.  The patient did not have access to video technology/had technical difficulties with video requiring transitioning to audio format only (telephone).  All issues noted in this document were discussed and addressed.  No physical exam could be performed with this format.  Please refer to the patient's chart for his  consent to telehealth for [facility].   Evaluation Performed:  Follow-up visit  Date:  01/12/2019   ID:  Michael [name], DOB 1955/10/30, MRN [id number]  Patient Location: Home Provider Location: Home  PCP:  [name], MD  Cardiologist:  Minus Breeding, MD  Electrophysiologist:  None   Chief Complaint:  followup  History of Present Illness:    Michael [name] is a 63 y.o. male with CAD, HTN and HLD.  He moved from [place].  Cardiac catheterization done prior to moving demonstrated a right coronary artery lesion that was treated with DES.  In September 2016, he was admitted for unstable angina.  Myoview was low risk.  He had angina again in August 2017 and underwent cardiac catheterization that showed patent stent in the RCA and left circumflex with disease in mid LAD.  He received 2 additional drug-eluting stents.  He was discharged but admitted later with similar chest pain and a troponin elevation a week after discharge.  Relook catheter showed patent LAD stents, left circumflex stent and RCA stent.  There was a 70% small diagonal lesion.  It was suspected he likely had coronary spasm.  Nitrate was added.  He was  last seen by Dr. Percival Spanish in February 2020 at which time he was doing well.  He was readmitted to the hospital on 12/16/2018 with chest pain.  CT angiogram of the chest was negative for PE.  Myoview obtained on 12/17/2018 was intermediate risk study, finding showed large defect of severe severity present in the inferolateral wall consistent with scar, EF 38% with inferior anterolateral akinesis.  This was followed up using an echocardiogram on the same day which showed EF 40 to 45%, severe akinesis of the entire inferior wall.  No further study was recommended.  He was continued on aspirin, statin and Imdur at 60 mg daily.  Since discharge, he has not had any further chest discomfort.  He attributed the recent chest discomfort to the back pain radiating to the front.  He is able to ambulate at home without any exertional symptoms.  Overall he is quite stable from cardiology perspective.  He is aware that if chest pain returns and become more correlated with exertion, he will need to let us know.  Otherwise he can follow-up with Dr. Percival Spanish in 4 to 5 months.  The patient does not have symptoms concerning for COVID-19 infection (fever, chills, cough, or new shortness of breath).    Past Medical History:  Diagnosis Date  . CAD S/P PCI to Cx & RCA    a. s/p PCI of LCx (2006) and ostium of RCA (2007) as well as DES to RCA in [place] (05/2014)  . HTN (hypertension)   . Hyperlipidemia    Past Surgical  History:  Procedure Laterality Date  . CARDIAC CATHETERIZATION N/A 05/06/2016   Procedure: Left Heart Cath and Coronary Angiography;  Surgeon: Nelva Bush, MD;  Location: [facility];  Service: Cardiovascular;  Laterality: N/A;  . CARDIAC CATHETERIZATION N/A 05/06/2016   Procedure: Coronary Stent Intervention;  Surgeon: Nelva Bush, MD;  Location: [facility];  Service: Cardiovascular;  Laterality: N/A;  . CARDIAC CATHETERIZATION N/A 05/13/2016   Procedure: Coronary/Graft  Angiography;  Surgeon: Peter M Martinique, MD;  Location: [facility];  Service: Cardiovascular;  Laterality: N/A;  . CORONARY ANGIOPLASTY WITH STENT PLACEMENT  2006   in [place], occluded CFX, s/p Taxus stent  . CORONARY ANGIOPLASTY WITH STENT PLACEMENT  2007   in [place], ostial RCA stent  . CORONARY ANGIOPLASTY WITH STENT PLACEMENT  2015   in [place], RCA stent  . TONSILLECTOMY AND ADENOIDECTOMY        Current Meds  Medication Sig  . aspirin 81 MG tablet Take 81 mg by mouth daily.  [name] atorvastatin (LIPITOR) 80 MG tablet TAKE 1 TABLET DAILY  . BRILINTA 90 MG TABS tablet TAKE 1 TABLET TWICE A DAY (Patient taking differently: Take 90 mg by mouth 2 (two) times daily. )  . CHERRY PO Take 50 mg by mouth daily. [place] extract  . Cinnamon 500 MG capsule Take 500 mg by mouth 2 (two) times daily.   . isosorbide mononitrate (IMDUR) 60 MG 24 hr tablet TAKE 1 TABLET DAILY  . Prasterone, DHEA, (DHEA 50 PO) Take 1 capsule by mouth daily. Take for 4 weeks, then off for 2 weeks  . Turmeric 500 MG CAPS Take 500 mg by mouth daily.   . Vitamin D, Ergocalciferol, (DRISDOL) 1.25 MG (50000 UT) CAPS capsule TAKE 1 CAPSULE EVERY 7 DAYS (Patient taking differently: Take 50,000 Units by mouth every Sunday. )     Allergies:   Patient has no known allergies.   Social History   Tobacco Use  . Smoking status: Never Smoker  . Smokeless tobacco: Never Used  Substance Use Topics  . Alcohol use: No    Alcohol/week: 0.0 standard drinks  . Drug use: No     Family Hx: The patient's family history includes Cancer in his brother and brother; Cerebral palsy in his daughter; Dementia in his father; Heart disease in his mother; Heart disease (age of onset: 58) in his maternal uncle; Heart disease (age of onset: 80) in his maternal grandfather; Hypertension in his father and sister; Stroke in his son.  ROS:   Please see the history of present illness.     All other systems reviewed and are  negative.   Prior CV studies:   The following studies were reviewed today:  Myoview 12/17/2018  No T wave inversion was noted during stress.  There was no ST segment deviation noted during stress.  Defect 1: There is a large defect of severe severity.  Findings consistent with prior myocardial infarction.  This is an intermediate risk study.   Large size, severe severity fixed (SDS 1) inferior and inferolateral perfusion defect suggestive of scar. LVEF 38% with inferior and lateral akinesis. This is an intermediate risk study.  Echo 12/17/2018 1. Severe akinesis of the left ventricular, entire inferior wall.  2. The left ventricle has mild-moderately reduced systolic function, with an ejection fraction of 40-45%. Left ventricular diastolic function could not be evaluated.  3. The right ventricle has normal systolc function. The cavity was normal. There is no increase in right  ventricular wall thickness.  4. The aortic root is normal in size and structure.  5. No intracardiac thrombi or masses were visualized.  Labs/Other Tests and Data Reviewed:    EKG:  An ECG dated 12/17/2018 was personally reviewed today and demonstrated:  Normal sinus rhythm without significant ST-T wave changes.  Recent Labs: 12/16/2018: ALT 28; Magnesium 1.8; TSH 2.646 12/17/2018: BUN 18; Creatinine, Ser 1.07; Hemoglobin 14.2; Platelets 201; Potassium 3.8; Sodium 140   Recent Lipid Panel Lab Results  Component Value Date/Time   CHOL 108 12/17/2018 05:20 AM   TRIG 85 12/17/2018 05:20 AM   HDL 43 12/17/2018 05:20 AM   CHOLHDL 2.5 12/17/2018 05:20 AM   LDLCALC 48 12/17/2018 05:20 AM    Wt Readings from Last 3 Encounters:  12/17/18 192 lb 3.2 oz (87.2 kg)  11/18/18 196 lb (88.9 kg)  09/29/18 199 lb 9.6 oz (90.5 kg)     Objective:    Vital Signs:  There were no vitals taken for this visit.   VITAL SIGNS:  reviewed  ASSESSMENT & PLAN:    1. Chest pain: No further chest discomfort.  Recent Myoview  was reassuring.  2. CAD: On aspirin, Brilinta and Lipitor.  3. Hypertension: Continue on current therapy.  He was unable to provide any vitals today.  4. Hyperlipidemia: Continue Lipitor.   COVID-19 Education: The signs and symptoms of COVID-19 were discussed with the patient and how to seek care for testing (follow up with PCP or arrange E-visit).  The importance of social distancing was discussed today.  Time:   Today, I have spent 5 minutes with the patient with telehealth technology discussing the above problems.     Medication Adjustments/Labs and Tests Ordered: Current medicines are reviewed at length with the patient today.  Concerns regarding medicines are outlined above.   Tests Ordered: No orders of the defined types were placed in this encounter.   Medication Changes: No orders of the defined types were placed in this encounter.   Disposition:  Follow up in 5 month(s)  Signed, Almyra Deforest, [place]  01/12/2019 11:40 PM    [facility]

## 2019-01-11 NOTE — Telephone Encounter (Addendum)
Virtual Visit Pre-Appointment Phone Call  Steps For Call:  1. Confirm consent - "In the setting of the current Covid19 crisis, you are scheduled for a Telephone visit with Almyra Deforest, PA-C on 01/11/2019 at 10:00AM.  Just as we do with many in-office visits, in order for you to participate in this visit, we must obtain consent.  If you'd like, I can send this to your mychart (if signed up) or email for you to review.  Otherwise, I can obtain your verbal consent now.  All virtual visits are billed to your insurance company just like a normal visit would be.  By agreeing to a virtual visit, we'd like you to understand that the technology does not allow for your provider to perform an examination, and thus may limit your provider's ability to fully assess your condition. If your provider identifies any concerns that need to be evaluated in person, we will make arrangements to do so.  Finally, though the technology is pretty good, we cannot assure that it will always work on either your or our end, and in the setting of a video visit, we may have to convert it to a phone-only visit.  In either situation, we cannot ensure that we have a secure connection.  Are you willing to proceed?" STAFF: Did the patient verbally acknowledge consent to telehealth visit? Document YES/NO here: YES  2. Confirm the BEST phone number to call the day of the visit by including in appointment notes  3. Give patient instructions for MyChart download to smartphone OR [email or web address] as below if video visit (depending on what platform provider is using)  4. Confirm that appointment type is correct in Epic appointment notes (VIDEO vs PHONE)  5. Advise patient to be prepared with their blood pressure, heart rate, weight, any heart rhythm information, their current medicines, and a piece of paper and pen handy for any instructions they may receive the day of their visit  6. Inform patient they will receive a phone call 15 minutes  prior to their appointment time (may be from unknown caller ID) so they should be prepared to answer    TELEPHONE CALL NOTE  Michael [name] has been deemed a candidate for a follow-up tele-health visit to limit community exposure during the Covid-19 pandemic. I spoke with the patient via phone to ensure availability of phone/video source, confirm preferred email & phone number, and discuss instructions and expectations.  I reminded Michael [name] to be prepared with any vital sign and/or heart rhythm information that could potentially be obtained via home monitoring, at the time of his visit. I reminded Michael [name] to expect a phone call prior to his visit.  Jacqulynn Cadet, CMA 01/11/2019 9:59 AM   INSTRUCTIONS FOR DOWNLOADING THE MYCHART APP TO SMARTPHONE  - The patient must first make sure to have activated MyChart and know their login information - If Apple, go to [employer] and type in MyChart in the search bar and download the app. If Android, ask patient to go to [employer] and type in [facility] in the search bar and download the app. The app is free but as with any other app downloads, their phone may require them to verify saved payment information or Apple/Android password.  - The patient will need to then log into the app with their MyChart username and password, and select [facility] as their healthcare provider to link the account. When it is time for your visit, go to the [facility]  app, find appointments, and click Begin Video Visit. Be sure to Select Allow for your device to access the Microphone and Camera for your visit. You will then be connected, and your provider will be with you shortly.  **If they have any issues connecting, or need assistance please contact MyChart service desk [phone])**  **If using a computer, in order to ensure the best quality for their visit they will need to use either of the following Internet Browsers: [employer], or Google Chrome**  IF USING DOXIMITY or DOXY.ME - The patient will receive a link just prior to their visit by text.     FULL LENGTH CONSENT FOR TELE-HEALTH VISIT   I hereby voluntarily request, consent and authorize [facility] and its employed or contracted physicians, physician assistants, nurse practitioners or other licensed health care professionals (the Practitioner), to provide me with telemedicine health care services (the "Services") as deemed necessary by the treating Practitioner. I acknowledge and consent to receive the Services by the Practitioner via telemedicine. I understand that the telemedicine visit will involve communicating with the Practitioner through live audiovisual communication technology and the disclosure of certain medical information by electronic transmission. I acknowledge that I have been given the opportunity to request an in-person assessment or other available alternative prior to the telemedicine visit and am voluntarily participating in the telemedicine visit.  I understand that I have the right to withhold or withdraw my consent to the use of telemedicine in the course of my care at any time, without affecting my right to future care or treatment, and that the Practitioner or I may terminate the telemedicine visit at any time. I understand that I have the right to inspect all information obtained and/or recorded in the course of the telemedicine visit and may receive copies of available information for a reasonable fee.  I understand that some of the potential risks of receiving the Services via telemedicine include:  [name] Delay or interruption in medical evaluation due to technological equipment failure or disruption; . Information transmitted may not be sufficient (e.g. poor resolution of images) to allow for appropriate medical decision making by the Practitioner; and/or  . In rare instances, security protocols could fail, causing a breach of personal  health information.  Furthermore, I acknowledge that it is my responsibility to provide information about my medical history, conditions and care that is complete and accurate to the best of my ability. I acknowledge that Practitioner's advice, recommendations, and/or decision may be based on factors not within their control, such as incomplete or inaccurate data provided by me or distortions of diagnostic images or specimens that may result from electronic transmissions. I understand that the practice of medicine is not an exact science and that Practitioner makes no warranties or guarantees regarding treatment outcomes. I acknowledge that I will receive a copy of this consent concurrently upon execution via email to the email address I last provided but may also request a printed copy by calling the office of [facility].    I understand that my insurance will be billed for this visit.   I have read or had this consent read to me. . I understand the contents of this consent, which adequately explains the benefits and risks of the Services being provided via telemedicine.  . I have been provided ample opportunity to ask questions regarding this consent and the Services and have had my questions answered to my satisfaction. . I give my informed consent for the services  to be provided through the use of telemedicine in my medical care  By participating in this telemedicine visit I agree to the above.

## 2019-01-22 NOTE — Progress Notes (Signed)
[name] [address] [place], [place] [address] Phone: [phone] Subjective:   [name], am serving as a scribe for Dr. Hulan Saas.  I'm seeing this patient by the request  of:    CC: Back and neck pain follow-up  [id number]  Devansh [name] is a 63 y.o. male coming in with complaint of back pain. Patient states that he has been having left side thoracic spine tightness. Has felt improvement with OMT previously. Last seen 11/2017.  Also states that since the beginning of the year that he has been having lower back pain and popping while performing a straight leg raise. Has been using ice, Tylenol and stretching to help his pain.    Has heart pathology please be careful Past Medical History:  Diagnosis Date  . CAD S/P PCI to Cx & RCA    a. s/p PCI of LCx (2006) and ostium of RCA (2007) as well as DES to RCA in [place] (05/2014)  . HTN (hypertension)   . Hyperlipidemia    Past Surgical History:  Procedure Laterality Date  . CARDIAC CATHETERIZATION N/A 05/06/2016   Procedure: Left Heart Cath and Coronary Angiography;  Surgeon: Nelva Bush, MD;  Location: [facility];  Service: Cardiovascular;  Laterality: N/A;  . CARDIAC CATHETERIZATION N/A 05/06/2016   Procedure: Coronary Stent Intervention;  Surgeon: Nelva Bush, MD;  Location: [facility];  Service: Cardiovascular;  Laterality: N/A;  . CARDIAC CATHETERIZATION N/A 05/13/2016   Procedure: Coronary/Graft Angiography;  Surgeon: Peter M Martinique, MD;  Location: [facility];  Service: Cardiovascular;  Laterality: N/A;  . CORONARY ANGIOPLASTY WITH STENT PLACEMENT  2006   in [place], occluded CFX, s/p Taxus stent  . CORONARY ANGIOPLASTY WITH STENT PLACEMENT  2007   in [place], ostial RCA stent  . CORONARY ANGIOPLASTY WITH STENT PLACEMENT  2015   in [place], RCA stent  . TONSILLECTOMY AND ADENOIDECTOMY      Social History   Socioeconomic  History  . Marital status: Married    Spouse name: Not on file  . Number of children: 4  . Years of education: Not on file  . Highest education level: Not on file  Occupational History  . Occupation: retired  Scientific laboratory technician  . Financial resource strain: Not on file  . Food insecurity:    Worry: Not on file    Inability: Not on file  . Transportation needs:    Medical: Not on file    Non-medical: Not on file  Tobacco Use  . Smoking status: Never Smoker  . Smokeless tobacco: Never Used  Substance and Sexual Activity  . Alcohol use: No    Alcohol/week: 0.0 standard drinks  . Drug use: No  . Sexual activity: Yes  Lifestyle  . Physical activity:    Days per week: Not on file    Minutes per session: Not on file  . Stress: Not on file  Relationships  . Social connections:    Talks on phone: Not on file    Gets together: Not on file    Attends religious service: Not on file    Active member of club or organization: Not on file    Attends meetings of clubs or organizations: Not on file    Relationship status: Not on file  Other Topics Concern  . Not on file  Social History Narrative   Lives with wife.  4 kids total (2 step, 2 biological). 1 that  has CP. Daughter Michael [name]- comes to [facility]).        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 Hypertension Father   . Dementia Father        Vascular. patient states dementia/possible alzheimers as well  . Heart disease Mother        died in 16s  . Hypertension Sister   . Cancer Brother        type unknown  . Cancer Brother        type unknown  . Heart disease Maternal Grandfather 56       Died probably of heart diseasse  . Heart disease Maternal Uncle 90       Died of "heart exploding"  . Cerebral palsy Daughter   . Stroke Son        severe problems with blood clotting      Current Outpatient Medications (Cardiovascular):  .  atorvastatin (LIPITOR) 80 MG tablet, TAKE 1 TABLET DAILY .  isosorbide mononitrate (IMDUR) 60 MG 24 hr tablet, TAKE 1 TABLET DAILY .  nitroGLYCERIN (NITROSTAT) 0.4 MG SL tablet, DISSOLVE 1 TABLET UNDER THE TONGUE EVERY 5 MINUTES AS NEEDED FOR CHEST PAIN UP TO 3 DOSES (Patient taking differently: Place 0.4 mg under the tongue every 5 (five) minutes as needed for chest pain. )   Current Outpatient Medications (Analgesics):  .  aspirin 81 MG tablet, Take 81 mg by mouth daily.  Current Outpatient Medications (Hematological):  [name]  BRILINTA 90 MG TABS tablet, TAKE 1 TABLET TWICE A DAY (Patient taking differently: Take 90 mg by mouth 2 (two) times daily. )  Current Outpatient Medications (Other):  [name]  CHERRY PO, Take 50 mg by mouth daily. [place] extract .  Cinnamon 500 MG capsule, Take 500 mg by mouth 2 (two) times daily.  .  Prasterone, DHEA, (DHEA 50 PO), Take 1 capsule by mouth daily. Take for 4 weeks, then off for 2 weeks .  Turmeric 500 MG CAPS, Take 500 mg by mouth daily.  .  Vitamin D, Ergocalciferol, (DRISDOL) 1.25 MG (50000 UT) CAPS capsule, TAKE 1 CAPSULE EVERY 7 DAYS (Patient taking differently: Take 50,000 Units by mouth every Sunday. )    Past medical history, social, surgical and family history all reviewed in electronic medical record.  No pertanent information unless stated regarding to the chief complaint.   Review of Systems:  No headache, visual changes, nausea, vomiting, diarrhea, constipation, dizziness, abdominal pain, skin rash, fevers, chills, night sweats, weight loss, swollen lymph nodes, body aches, joint swelling,  chest pain, shortness of breath, mood changes.  Positive muscle aches  Objective  Blood pressure (!) 106/56, pulse 86, height 5\' 7"  (1.702 m), weight 193 lb (87.5 kg), SpO2 96 %.    General: No apparent distress alert and oriented x3 mood and affect normal, dressed appropriately.  HEENT: Pupils equal,  extraocular movements intact  Respiratory: Patient's speak in full sentences and does not appear short of breath  Cardiovascular: No lower extremity edema, non tender, no erythema  Skin: Warm dry intact with no signs of infection or rash on extremities or on axial skeleton.  Abdomen: Soft nontender  Neuro: Cranial nerves II through XII are intact, neurovascularly intact in all extremities with 2+ DTRs and 2+ pulses.  Lymph: No lymphadenopathy of posterior or anterior cervical chain or  axillae bilaterally.  Gait normal with good balance and coordination.  MSK:  Non tender with full range of motion and good stability and symmetric strength and tone of shoulders, elbows, wrist, hip, knee and ankles bilaterally.  Neck: Inspection loss of lordosis. No palpable stepoffs. Negative Spurling's maneuver. Full neck range of motion Grip strength and sensation normal in bilateral hands Strength good C4 to T1 distribution No sensory change to C4 to T1 Negative Hoffman sign bilaterally Reflexes normal  Back Exam:  Inspection: Unremarkable  Motion: Flexion 45 deg, Extension 25 deg, Side Bending to 35 deg bilaterally,  Rotation to 45 deg bilaterally  SLR laying: Negative  XSLR laying: Negative  Palpable tenderness: Tender to palpation the paraspinal musculature lumbar spine right greater than left. FABER: negative. Sensory change: Gross sensation intact to all lumbar and sacral dermatomes.  Reflexes: 2+ at both patellar tendons, 2+ at achilles tendons, Babinski's downgoing.  Strength at foot  Plantar-flexion: 5/5 Dorsi-flexion: 5/5 Eversion: 5/5 Inversion: 5/5  Leg strength  Quad: 5/5 Hamstring: 5/5 Hip flexor: 5/5 Hip abductors: 5/5    Osteopathic findings  T9 extended rotated and side bent left L4 flexed rotated and side bent right Sacrum right on right     Impression and Recommendations:     This case required medical decision making of moderate complexity. The above documentation  has been reviewed and is accurate and complete [name], DO       Note: This dictation was prepared with Dragon dictation along with smaller phrase technology. Any transcriptional errors that result from this process are unintentional.

## 2019-01-24 ENCOUNTER — Other Ambulatory Visit: Payer: Self-pay

## 2019-01-24 ENCOUNTER — Ambulatory Visit (INDEPENDENT_AMBULATORY_CARE_PROVIDER_SITE_OTHER): Admitting: Family Medicine

## 2019-01-24 ENCOUNTER — Encounter: Payer: Self-pay | Admitting: Family Medicine

## 2019-01-24 VITALS — BP 106/56 | HR 86 | Ht 67.0 in | Wt 193.0 lb

## 2019-01-24 DIAGNOSIS — M999 Biomechanical lesion, unspecified: Secondary | ICD-10-CM

## 2019-01-24 DIAGNOSIS — G8929 Other chronic pain: Secondary | ICD-10-CM

## 2019-01-24 DIAGNOSIS — M549 Dorsalgia, unspecified: Secondary | ICD-10-CM | POA: Diagnosis not present

## 2019-01-24 NOTE — Patient Instructions (Signed)
Good to see you  Ice 20 minutes 2 times daily. Usually after activity and before bed. Stretch after walking for sure Work on the core but focus on stability and isometric like planks.  See me again in 4 weeks

## 2019-01-24 NOTE — Assessment & Plan Note (Signed)
Patient did have more of an tight hip flexor today.  Little different than previous exam.  We discussed stretches after.  Discussed which activities to do which wants to avoid.  Patient is to increase activity as tolerated.  Discussed with patient in great length about this.  Discussed the importance of core strengthening.  Follow-up again 4 to 8 weeks

## 2019-01-24 NOTE — Assessment & Plan Note (Signed)
Decision today to treat with OMT was based on Physical Exam  After verbal consent patient was treated with HVLA, ME, FPR techniques in  thoracic, lumbar and sacral areas  Patient tolerated the procedure well with improvement in symptoms  Patient given exercises, stretches and lifestyle modifications  See medications in patient instructions if given  Patient will follow up in 4-8 weeks 

## 2019-02-20 NOTE — Progress Notes (Signed)
[name] [name], [name] [address] Phone: [phone] Subjective:   I Michael [name] am serving as a Education administrator for Dr. Hulan Saas.   CC: Back pain follow-up  CNO:[id number]  Michael [name] is a 63 y.o. male coming in with complaint of back pain. States that since his last visit his pain has moved to his lower back. Still taking tylenol for pain. Still stretches and exercises. Mornings are better than the rest of the day. Ice helps sometimes.        Past Medical History:  Diagnosis Date  . CAD S/P PCI to Cx & RCA    a. s/p PCI of LCx (2006) and ostium of RCA (2007) as well as DES to RCA in [place] (05/2014)  . HTN (hypertension)   . Hyperlipidemia    Past Surgical History:  Procedure Laterality Date  . CARDIAC CATHETERIZATION N/A 05/06/2016   Procedure: Left Heart Cath and Coronary Angiography;  Surgeon: Nelva Bush, MD;  Location: [facility];  Service: Cardiovascular;  Laterality: N/A;  . CARDIAC CATHETERIZATION N/A 05/06/2016   Procedure: Coronary Stent Intervention;  Surgeon: Nelva Bush, MD;  Location: [facility];  Service: Cardiovascular;  Laterality: N/A;  . CARDIAC CATHETERIZATION N/A 05/13/2016   Procedure: Coronary/Graft Angiography;  Surgeon: Peter M Martinique, MD;  Location: [facility];  Service: Cardiovascular;  Laterality: N/A;  . CORONARY ANGIOPLASTY WITH STENT PLACEMENT  2006   in [place], occluded CFX, s/p Taxus stent  . CORONARY ANGIOPLASTY WITH STENT PLACEMENT  2007   in [place], ostial RCA stent  . CORONARY ANGIOPLASTY WITH STENT PLACEMENT  2015   in [place], RCA stent  . TONSILLECTOMY AND ADENOIDECTOMY      Social History   Socioeconomic History  . Marital status: Married    Spouse name: Not on file  . Number of children: 4  . Years of education: Not on file  . Highest education level: Not on file  Occupational History  . Occupation: retired  Scientific laboratory technician   . Financial resource strain: Not on file  . Food insecurity:    Worry: Not on file    Inability: Not on file  . Transportation needs:    Medical: Not on file    Non-medical: Not on file  Tobacco Use  . Smoking status: Never Smoker  . Smokeless tobacco: Never Used  Substance and Sexual Activity  . Alcohol use: No    Alcohol/week: 0.0 standard drinks  . Drug use: No  . Sexual activity: Yes  Lifestyle  . Physical activity:    Days per week: Not on file    Minutes per session: Not on file  . Stress: Not on file  Relationships  . Social connections:    Talks on phone: Not on file    Gets together: Not on file    Attends religious service: Not on file    Active member of club or organization: Not on file    Attends meetings of clubs or organizations: Not on file    Relationship status: Not on file  Other Topics Concern  . Not on file  Social History Narrative   Lives with wife.  4 kids total (2 step, 2 biological). 1 that has CP. Daughter [name]- comes to [facility]).        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 Hypertension Father   . Dementia Father        Vascular. patient states dementia/possible alzheimers as well  . Heart disease Mother        died in 8s  . Hypertension Sister   . Cancer Brother        type unknown  . Cancer Brother        type unknown  . Heart disease Maternal Grandfather 11       Died probably of heart diseasse  . Heart disease Maternal Uncle 2       Died of "heart exploding"  . Cerebral palsy Daughter   . Stroke Son        severe problems with blood clotting     Current Outpatient Medications (Cardiovascular):  .  atorvastatin (LIPITOR) 80 MG tablet, TAKE 1 TABLET DAILY .  isosorbide mononitrate (IMDUR) 60 MG 24 hr tablet, TAKE 1 TABLET DAILY .  nitroGLYCERIN (NITROSTAT) 0.4 MG SL tablet,  DISSOLVE 1 TABLET UNDER THE TONGUE EVERY 5 MINUTES AS NEEDED FOR CHEST PAIN UP TO 3 DOSES (Patient taking differently: Place 0.4 mg under the tongue every 5 (five) minutes as needed for chest pain. )   Current Outpatient Medications (Analgesics):  .  aspirin 81 MG tablet, Take 81 mg by mouth daily.  Current Outpatient Medications (Hematological):  [name]  BRILINTA 90 MG TABS tablet, TAKE 1 TABLET TWICE A DAY (Patient taking differently: Take 90 mg by mouth 2 (two) times daily. )  Current Outpatient Medications (Other):  [name]  CHERRY PO, Take 50 mg by mouth daily. [place] extract .  Cinnamon 500 MG capsule, Take 500 mg by mouth 2 (two) times daily.  .  Prasterone, DHEA, (DHEA 50 PO), Take 1 capsule by mouth daily. Take for 4 weeks, then off for 2 weeks .  Turmeric 500 MG CAPS, Take 500 mg by mouth daily.  .  Vitamin D, Ergocalciferol, (DRISDOL) 1.25 MG (50000 UT) CAPS capsule, TAKE 1 CAPSULE EVERY 7 DAYS (Patient taking differently: Take 50,000 Units by mouth every Sunday. ) .  tiZANidine (ZANAFLEX) 4 MG tablet, Take 1 tablet (4 mg total) by mouth Nightly for 10 days.    Past medical history, social, surgical and family history all reviewed in electronic medical record.  No pertanent information unless stated regarding to the chief complaint.   Review of Systems:  No headache, visual changes, nausea, vomiting, diarrhea, constipation, dizziness, abdominal pain, skin rash, fevers, chills, night sweats, weight loss, swollen lymph nodes, body aches, joint swelling,chest pain, shortness of breath, mood changes.  Positive muscle aches  Objective  Blood pressure 128/70, pulse 63, height 5\' 7"  (1.702 m), weight 196 lb (88.9 kg), SpO2 98 %.    General: No apparent distress alert and oriented x3 mood and affect normal, dressed appropriately.  HEENT: Pupils equal, extraocular movements intact  Respiratory: Patient's speak in full sentences and does not appear short of breath  Cardiovascular: No lower  extremity edema, non tender, no erythema  Skin: Warm dry intact with no signs of infection or rash on extremities or on axial skeleton.  Abdomen: Soft nontender  Neuro: Cranial nerves II through XII are intact, neurovascularly intact in all extremities with 2+ DTRs and 2+ pulses.  Lymph: No lymphadenopathy of posterior or anterior cervical chain or axillae bilaterally.  Gait normal with good balance and coordination.  MSK:  Non tender with full range of motion and good  stability and symmetric strength and tone of shoulders, elbows, wrist, hip, knee and ankles bilaterally.  Back Exam:  Inspection: Unremarkable  Motion: Flexion 35 deg, Extension 25 deg, Side Bending to 35 deg bilaterally,  Rotation to 35 deg bilaterally  SLR laying: Negative  XSLR laying: Negative  Palpable tenderness: More tightness in the paraspinal musculature lumbar spine more at the lumbosacral junction than usual.  More right greater than left. FABER: Positive right. Sensory change: Gross sensation intact to all lumbar and sacral dermatomes.  Reflexes: 2+ at both patellar tendons, 2+ at achilles tendons, Babinski's downgoing.  Strength at foot  Plantar-flexion: 5/5 Dorsi-flexion: 5/5 Eversion: 5/5 Inversion: 5/5  Leg strength  Quad: 5/5 Hamstring: 5/5 Hip flexor: 5/5 Hip abductors: 5/5  Gait unremarkable.  Osteopathic findings   T7 extended rotated and side bent left L2 flexed rotated and side bent right Sacrum right on right     Impression and Recommendations:     This case required medical decision making of moderate complexity. The above documentation has been reviewed and is accurate and complete [name], DO       Note: This dictation was prepared with Dragon dictation along with smaller phrase technology. Any transcriptional errors that result from this process are unintentional.

## 2019-02-21 ENCOUNTER — Other Ambulatory Visit: Payer: Self-pay

## 2019-02-21 ENCOUNTER — Encounter: Payer: Self-pay | Admitting: Family Medicine

## 2019-02-21 ENCOUNTER — Ambulatory Visit (INDEPENDENT_AMBULATORY_CARE_PROVIDER_SITE_OTHER): Admitting: Family Medicine

## 2019-02-21 VITALS — BP 128/70 | HR 63 | Ht 67.0 in | Wt 196.0 lb

## 2019-02-21 DIAGNOSIS — G8929 Other chronic pain: Secondary | ICD-10-CM

## 2019-02-21 DIAGNOSIS — M549 Dorsalgia, unspecified: Secondary | ICD-10-CM

## 2019-02-21 DIAGNOSIS — M999 Biomechanical lesion, unspecified: Secondary | ICD-10-CM

## 2019-02-21 MED ORDER — TIZANIDINE HCL 4 MG PO TABS: 4.0000 mg | ORAL_TABLET | Freq: Every evening | ORAL | 2 refills | Status: AC

## 2019-02-21 NOTE — Patient Instructions (Signed)
zanaflex nightly as needed See me again in 4wks

## 2019-02-21 NOTE — Assessment & Plan Note (Signed)
Decision today to treat with OMT was based on Physical Exam  After verbal consent patient was treated with HVLA, ME, FPR techniques in  thoracic, lumbar and sacral areas  Patient tolerated the procedure well with improvement in symptoms  Patient given exercises, stretches and lifestyle modifications  See medications in patient instructions if given  Patient will follow up in 4-6 weeks 

## 2019-02-21 NOTE — Assessment & Plan Note (Signed)
Stable overall.  Discussed with patient in great length about potentially doing advanced imaging but patient has been doing okay and does respond well to manipulation.  We discussed which activities of doing which wants to avoid.  Patient is to increase activity slowly over the course of the next several weeks.  We discussed posture and ergonomics.  We discussed other things such as laboratory work-up and potentially advanced imaging if this continues.  Follow-up again in 4 to 6 weeks

## 2019-03-15 ENCOUNTER — Ambulatory Visit (INDEPENDENT_AMBULATORY_CARE_PROVIDER_SITE_OTHER): Admitting: Family Medicine

## 2019-03-15 ENCOUNTER — Encounter: Payer: Self-pay | Admitting: Family Medicine

## 2019-03-15 ENCOUNTER — Other Ambulatory Visit: Payer: Self-pay

## 2019-03-15 VITALS — BP 118/70 | HR 65 | Temp 97.8°F | Ht 67.0 in | Wt 196.5 lb

## 2019-03-15 DIAGNOSIS — E785 Hyperlipidemia, unspecified: Secondary | ICD-10-CM | POA: Diagnosis not present

## 2019-03-15 DIAGNOSIS — Z0000 Encounter for general adult medical examination without abnormal findings: Secondary | ICD-10-CM | POA: Diagnosis not present

## 2019-03-15 DIAGNOSIS — R351 Nocturia: Secondary | ICD-10-CM | POA: Diagnosis not present

## 2019-03-15 DIAGNOSIS — Z9861 Coronary angioplasty status: Secondary | ICD-10-CM

## 2019-03-15 DIAGNOSIS — E669 Obesity, unspecified: Secondary | ICD-10-CM

## 2019-03-15 DIAGNOSIS — I10 Essential (primary) hypertension: Secondary | ICD-10-CM

## 2019-03-15 DIAGNOSIS — I2510 Atherosclerotic heart disease of native coronary artery without angina pectoris: Secondary | ICD-10-CM | POA: Diagnosis not present

## 2019-03-15 DIAGNOSIS — N401 Enlarged prostate with lower urinary tract symptoms: Secondary | ICD-10-CM | POA: Diagnosis not present

## 2019-03-15 LAB — PSA: PSA: 1.25 ng/mL (ref 0.10–4.00)

## 2019-03-15 NOTE — Patient Instructions (Addendum)
Things look pretty good today- only lab to add on is the prostate test  Keep up great job getting up to those 60k steps per week! Love the activity level as well as fact that you are not having chest pain with it!   Will get a1c next visit  Please stop by lab before you go If you do not have mychart- we will call you about results within 5 business days of [place] receiving them.  If you have mychart- we will send your results within 3 business days of [place] receiving them.  If abnormal or we want to clarify a result, we will call or mychart you to make sure you receive the message.  If you have questions or concerns or don't hear within 5-7 days, please send [place] a message or call us.

## 2019-03-15 NOTE — Progress Notes (Signed)
Phone: [phone]   Subjective:  Patient presents today for their annual physical. Chief complaint-noted.   See problem oriented charting- ROS- full  review of systems was completed and negative except for: Back pain but follows with Dr. Tamala Julian  The following were reviewed and entered/updated in epic: Past Medical History:  Diagnosis Date  . CAD S/P PCI to Cx & RCA    a. s/p PCI of LCx (2006) and ostium of RCA (2007) as well as DES to RCA in [place] (05/2014)  . HTN (hypertension)   . Hyperlipidemia    Patient Active Problem List   Diagnosis Date Noted  . CAD S/P multiple PCIs 09/08/2014    Priority: High  . History of adenomatous polyp of colon 06/07/2018    Priority: Medium  . BPH (benign prostatic hyperplasia) 06/07/2018    Priority: Medium  . Impaired glucose tolerance 11/03/2016    Priority: Medium  . HTN (hypertension)     Priority: Medium  . Dyslipidemia 09/08/2014    Priority: Medium  . Vitiligo 06/07/2018    Priority: Low  . Gluteal pain 04/10/2018    Priority: Low  . Plantar fasciitis 10/12/2017    Priority: Low  . Back pain 04/13/2017    Priority: Low  . Nonallopathic lesion of thoracic region 04/13/2017    Priority: Low  . Nonallopathic lesion of sacral region 04/13/2017    Priority: Low  . Nonallopathic lesion of lumbosacral region 04/13/2017    Priority: Low  . Obesity     Priority: Low  . Angina at rest [facility]) 12/16/2018   Past Surgical History:  Procedure Laterality Date  . CARDIAC CATHETERIZATION N/A 05/06/2016   Procedure: Left Heart Cath and Coronary Angiography;  Surgeon: Nelva Bush, MD;  Location: [place] [facility];  Service: Cardiovascular;  Laterality: N/A;  . CARDIAC CATHETERIZATION N/A 05/06/2016   Procedure: Coronary Stent Intervention;  Surgeon: Nelva Bush, MD;  Location: [place] [facility];  Service: Cardiovascular;  Laterality: N/A;  . CARDIAC CATHETERIZATION N/A 05/13/2016   Procedure: Coronary/Graft Angiography;   Surgeon: Peter M Martinique, MD;  Location: [place] [facility];  Service: Cardiovascular;  Laterality: N/A;  . CORONARY ANGIOPLASTY WITH STENT PLACEMENT  2006   in [place], occluded CFX, s/p Taxus stent  . CORONARY ANGIOPLASTY WITH STENT PLACEMENT  2007   in [place], ostial RCA stent  . CORONARY ANGIOPLASTY WITH STENT PLACEMENT  2015   in [place], RCA stent  . TONSILLECTOMY AND ADENOIDECTOMY       Family History  Problem Relation Age of Onset  . Hypertension Father   . Dementia Father        Vascular. patient states dementia/possible alzheimers as well  . Heart disease Mother        died in 44s  . Hyperlipidemia Sister   . Cancer Brother        type unknown  . Cancer Brother        type unknown  . Heart disease Maternal Grandfather 59       Died probably of heart diseasse  . Heart disease Maternal Uncle 22       Died of "heart exploding"  . Cerebral palsy Daughter   . Stroke Son        severe problems with blood clotting    Medications- reviewed and updated Current Outpatient Medications  Medication Sig Dispense Refill  . aspirin 81 MG tablet Take 81 mg by mouth daily.    [name] atorvastatin (LIPITOR) 80 MG tablet  TAKE 1 TABLET DAILY 90 tablet 1  . BRILINTA 90 MG TABS tablet TAKE 1 TABLET TWICE A DAY (Patient taking differently: Take 90 mg by mouth 2 (two) times daily. ) 180 tablet 3  . CHERRY PO Take 50 mg by mouth daily. [place] extract    . Cinnamon 500 MG capsule Take 500 mg by mouth 2 (two) times daily.     . isosorbide mononitrate (IMDUR) 60 MG 24 hr tablet TAKE 1 TABLET DAILY 90 tablet 1  . nitroGLYCERIN (NITROSTAT) 0.4 MG SL tablet DISSOLVE 1 TABLET UNDER THE TONGUE EVERY 5 MINUTES AS NEEDED FOR CHEST PAIN UP TO 3 DOSES (Patient taking differently: Place 0.4 mg under the tongue every 5 (five) minutes as needed for chest pain. ) 1 tablet 12  . Prasterone, DHEA, (DHEA 50 PO) Take 1 capsule by mouth daily. Take for 4 weeks, then off for 2 weeks    . tiZANidine  (ZANAFLEX) 4 MG tablet Take 4 mg by mouth at bedtime.     . Turmeric 500 MG CAPS Take 500 mg by mouth daily.     . Vitamin D, Ergocalciferol, (DRISDOL) 1.25 MG (50000 UT) CAPS capsule TAKE 1 CAPSULE EVERY 7 DAYS (Patient taking differently: Take 50,000 Units by mouth every Sunday. ) 12 capsule 4   No current facility-administered medications for this visit.     Allergies-reviewed and updated No Known Allergies  Social History   Social History Narrative   Lives with wife.  4 kids total (2 step, 2 biological). 1 that has CP. Daughter [name]- comes to [facility]).        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      Hobbies: gardening, lawn care, care for grandkids   Objective  Objective:  BP 118/70 (BP Location: Left Arm, Patient Position: Sitting, Cuff Size: Normal)   Pulse 65   Temp 97.8 F (36.6 C) (Oral)   Ht 5\' 7"  (1.702 m)   Wt 196 lb 8 oz (89.1 kg)   SpO2 98%   BMI 30.78 kg/m  Gen: NAD, resting comfortably HEENT: Mucous membranes are moist. Oropharynx normal Neck: no thyromegaly or cervical lymphadenopathy CV: RRR no murmurs rubs or gallops Lungs: CTAB no crackles, wheeze, rhonchi Abdomen: soft/nontender/nondistended/normal bowel sounds. No rebound or guarding.  Ext: no edema Skin: warm, dry Neuro: grossly normal, moves all extremities, PERRLA   Assessment and Plan  63 y.o. male presenting for annual physical.  Health Maintenance counseling: 1. Anticipatory guidance: Patient counseled regarding regular dental exams -q6 months, eye exams -yearly or so- now due,  avoiding smoking and second hand smoke , limiting alcohol to 2 beverages per day - doesn't drink.   2. Risk factor reduction:  Advised patient of need for regular exercise and diet rich and fruits and vegetables to reduce risk of heart attack and stroke. Exercise- walking a lot- pushes mower or walks neighborhood- wanted to swim but back limiting him. Diet-has tried to cut down on carbs but has  been hard, feels like could cut purtion size down.  Encouraged need for healthy eating, regular exercise, weight loss.   Wt Readings from Last 3 Encounters:  03/15/19 196 lb 8 oz (89.1 kg)  02/21/19 196 lb (88.9 kg)  01/24/19 193 lb (87.5 kg)  3. Immunizations/screenings/ancillary studies- - defers shingrix for now- will discuss next year Immunization History  Administered Date(s) Administered  . Influenza,inj,Quad PF,6+ Mos 06/07/2018  . Influenza-Unspecified 06/26/2014  . Td 12/08/2017  4. Prostate cancer  screening- will trend psa today. Defer rectal exam for now unless psa trend concerning.   Lab Results  Component Value Date   PSA 1.53 12/08/2017   5. Colon cancer screening - 01/2018 with history of adenomatous polyp and advised 5 year repeat 6. Skin cancer screening- has seen dermatology in the past- no regular visits. advised regular sunscreen use. Denies worrisome, changing, or new skin lesions.  7. Never smoker  Status of chronic or acute concerns   #CAD-follows with cardiology Dr. Percival Spanish and compliant with Brilinta and aspirin.  No chest pain on Imdur. Had chest pain scare in march- had updated bloodwork and rule out MI- thought to be muscular. Not having to use nitroglycerin  #Hyperlipidemia-compliant with atorvastatin 80 mg.  LDL in March under 70-ideal  #Hypertension-controlled on Imdur 60 mg daily  #Obesity noted-see discussion above  #Back pain-followed by Dr. Maricela Curet several supplements through his office.  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   Return precautions advised.  Garret Reddish, MD

## 2019-03-21 NOTE — Progress Notes (Signed)
[name] [address], [place] [address] Phone: [phone] Subjective:   Michael [name], am serving as a scribe for Dr. Hulan Saas.    CC: Low back pain follow-up  CHY:[id number]  Michael [name] is a 63 y.o. male coming in with complaint of back pain. Is better but is unsure if stretching is helping or hurting. Has been stretching but does notice his back hurts when he lies supine so he discontinued the stretches. Using muscle relaxer at night. Does not have pain throughout the day.  Patient states it is only a tightness.  Seems to be worse in a flexed position   Patient has had x-rays previously showed only mild osteoarthritic changes.  Past Medical History:  Diagnosis Date  . CAD S/P PCI to Cx & RCA    a. s/p PCI of LCx (2006) and ostium of RCA (2007) as well as DES to RCA in [place] (05/2014)  . HTN (hypertension)   . Hyperlipidemia    Past Surgical History:  Procedure Laterality Date  . CARDIAC CATHETERIZATION N/A 05/06/2016   Procedure: Left Heart Cath and Coronary Angiography;  Surgeon: Nelva Bush, MD;  Location: [place] [facility];  Service: Cardiovascular;  Laterality: N/A;  . CARDIAC CATHETERIZATION N/A 05/06/2016   Procedure: Coronary Stent Intervention;  Surgeon: Nelva Bush, MD;  Location: [place] [facility];  Service: Cardiovascular;  Laterality: N/A;  . CARDIAC CATHETERIZATION N/A 05/13/2016   Procedure: Coronary/Graft Angiography;  Surgeon: Peter M Martinique, MD;  Location: [place] [facility];  Service: Cardiovascular;  Laterality: N/A;  . CORONARY ANGIOPLASTY WITH STENT PLACEMENT  2006   in [place], occluded CFX, s/p Taxus stent  . CORONARY ANGIOPLASTY WITH STENT PLACEMENT  2007   in [place], ostial RCA stent  . CORONARY ANGIOPLASTY WITH STENT PLACEMENT  2015   in [place], RCA stent  . TONSILLECTOMY AND ADENOIDECTOMY      Social History   Socioeconomic History  . Marital status: Married   Spouse name: Not on file  . Number of children: 4  . Years of education: Not on file  . Highest education level: Not on file  Occupational History  . Occupation: retired  Scientific laboratory technician  . Financial resource strain: Not on file  . Food insecurity    Worry: Not on file    Inability: Not on file  . Transportation needs    Medical: Not on file    Non-medical: Not on file  Tobacco Use  . Smoking status: Never Smoker  . Smokeless tobacco: Never Used  Substance and Sexual Activity  . Alcohol use: [name]    Alcohol/week: 0.0 standard drinks  . Drug use: [name]  . Sexual activity: Yes  Lifestyle  . Physical activity    Days per week: Not on file    Minutes per session: Not on file  . Stress: Not on file  Relationships  . Social Herbalist on phone: Not on file    Gets together: Not on file    Attends religious service: Not on file    Active member of club or organization: Not on file    Attends meetings of clubs or organizations: Not on file    Relationship status: Not on file  Other Topics Concern  . Not on file  Social History Narrative   Lives with wife.  4 kids total (2 step, 2 biological). 1 that has CP. Daughter [name]- comes to [facility]).  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 Hypertension Father   . Dementia Father        Vascular. patient states dementia/possible alzheimers as well  . Heart disease Mother        died in 72s  . Hyperlipidemia Sister   . Cancer Brother        type unknown  . Cancer Brother        type unknown  . Heart disease Maternal Grandfather 79       Died probably of heart diseasse  . Heart disease Maternal Uncle 20       Died of "heart exploding"  . Cerebral palsy Daughter   . Stroke Son        severe problems with blood clotting     Current Outpatient Medications  (Cardiovascular):  .  atorvastatin (LIPITOR) 80 MG tablet, TAKE 1 TABLET DAILY .  isosorbide mononitrate (IMDUR) 60 MG 24 hr tablet, TAKE 1 TABLET DAILY .  nitroGLYCERIN (NITROSTAT) 0.4 MG SL tablet, DISSOLVE 1 TABLET UNDER THE TONGUE EVERY 5 MINUTES AS NEEDED FOR CHEST PAIN UP TO 3 DOSES (Patient taking differently: Place 0.4 mg under the tongue every 5 (five) minutes as needed for chest pain. )   Current Outpatient Medications (Analgesics):  .  aspirin 81 MG tablet, Take 81 mg by mouth daily.  Current Outpatient Medications (Hematological):  [name]  BRILINTA 90 MG TABS tablet, TAKE 1 TABLET TWICE A DAY (Patient taking differently: Take 90 mg by mouth 2 (two) times daily. )  Current Outpatient Medications (Other):  [name]  CHERRY PO, Take 50 mg by mouth daily. [place] extract .  Cinnamon 500 MG capsule, Take 500 mg by mouth 2 (two) times daily.  .  Prasterone, DHEA, (DHEA 50 PO), Take 1 capsule by mouth daily. Take for 4 weeks, then off for 2 weeks .  tiZANidine (ZANAFLEX) 4 MG tablet, Take 4 mg by mouth at bedtime.  .  Turmeric 500 MG CAPS, Take 500 mg by mouth daily.  .  Vitamin D, Ergocalciferol, (DRISDOL) 1.25 MG (50000 UT) CAPS capsule, TAKE 1 CAPSULE EVERY 7 DAYS (Patient taking differently: Take 50,000 Units by mouth every Sunday. )    Past medical history, social, surgical and family history all reviewed in electronic medical record.  [name] pertanent information unless stated regarding to the chief complaint.   Review of Systems:  [name] headache, visual changes, nausea, vomiting, diarrhea, constipation, dizziness, abdominal pain, skin rash, fevers, chills, night sweats, weight loss, swollen lymph nodes, body aches, joint swelling,chest pain, shortness of breath, mood changes.  Positive muscle aches  Objective  Blood pressure 112/66, pulse 69, height 5\' 7"  (1.702 m), SpO2 97 %.    General: [name] apparent distress alert and oriented x3 mood and affect normal, dressed appropriately.  HEENT: Pupils  equal, extraocular movements intact  Respiratory: Patient's speak in full sentences and does not appear short of breath  Cardiovascular: [name] lower extremity edema, non tender, [name] erythema  Skin: Warm dry intact with [name] signs of infection or rash on extremities or on axial skeleton.  Abdomen: Soft nontender  Neuro: Cranial nerves II through XII are intact, neurovascularly intact in all extremities with 2+ DTRs and 2+ pulses.  Lymph: [name] lymphadenopathy of posterior or anterior cervical chain or axillae bilaterally.  Gait normal with good balance  and coordination.  MSK:  Non tender with full range of motion and good stability and symmetric strength and tone of shoulders, elbows, wrist, hip, knee and ankles bilaterally.  Back Exam:  Inspection: Loss of lordosis Motion: Flexion 45 deg, Extension 25 deg, Side Bending to 35 deg bilaterally,  Rotation to 20 deg bilaterally  SLR laying: Negative tightness of the hamstrings bilaterally XSLR laying: Negative  Palpable tenderness: Tender to palpation of paraspinal musculature lumbar spine right greater than left. FABER: Deferred. Sensory change: Gross sensation intact to all lumbar and sacral dermatomes.  Reflexes: 2+ at both patellar tendons, 2+ at achilles tendons, Babinski's downgoing.  Strength at foot  Plantar-flexion: 5/5 Dorsi-flexion: 5/5 Eversion: 5/5 Inversion: 5/5  Leg strength  Quad: 5/5 Hamstring: 5/5 Hip flexor: 5/5 Hip abductors: 5/5    Osteopathic findings  C4 flexed rotated and side bent left C6 flexed rotated and side bent left T3 extended rotated and side bent right inhaled third rib T7 extended rotated and side bent left L4 flexed rotated and side bent left  Sacrum right on right     Impression and Recommendations:     This case required medical decision making of moderate complexity. The above documentation has been reviewed and is accurate and complete [name], DO       Note: This dictation was prepared  with Dragon dictation along with smaller phrase technology. Any transcriptional errors that result from this process are unintentional.

## 2019-03-21 NOTE — Assessment & Plan Note (Signed)
Multifactorial.  Responded fairly well to osteopathic manipulation.  Patient declines declines formal physical therapy or advanced imaging at the moment.  Discussed home exercises and icing regimen.  Follow-up again 6 to 8 weeks

## 2019-03-21 NOTE — Assessment & Plan Note (Signed)
Decision today to treat with OMT was based on Physical Exam  After verbal consent patient was treated with HVLA, ME, FPR techniques in  thoracic, lumbar and sacral areas  Patient tolerated the procedure well with improvement in symptoms  Patient given exercises, stretches and lifestyle modifications  See medications in patient instructions if given  Patient will follow up in  6-8 weeks 

## 2019-03-22 ENCOUNTER — Other Ambulatory Visit: Payer: Self-pay

## 2019-03-22 ENCOUNTER — Ambulatory Visit (INDEPENDENT_AMBULATORY_CARE_PROVIDER_SITE_OTHER): Admitting: Family Medicine

## 2019-03-22 ENCOUNTER — Encounter: Payer: Self-pay | Admitting: Family Medicine

## 2019-03-22 VITALS — BP 112/66 | HR 69 | Ht 67.0 in

## 2019-03-22 DIAGNOSIS — M549 Dorsalgia, unspecified: Secondary | ICD-10-CM | POA: Diagnosis not present

## 2019-03-22 DIAGNOSIS — M999 Biomechanical lesion, unspecified: Secondary | ICD-10-CM

## 2019-03-22 DIAGNOSIS — G8929 Other chronic pain: Secondary | ICD-10-CM | POA: Diagnosis not present

## 2019-03-22 NOTE — Patient Instructions (Addendum)
See me again in 4-5 weeks

## 2019-03-31 ENCOUNTER — Ambulatory Visit: Payer: Self-pay | Admitting: *Deleted

## 2019-03-31 ENCOUNTER — Telehealth: Payer: Self-pay

## 2019-03-31 ENCOUNTER — Encounter: Payer: Self-pay | Admitting: Family Medicine

## 2019-03-31 ENCOUNTER — Other Ambulatory Visit: Payer: Self-pay

## 2019-03-31 ENCOUNTER — Ambulatory Visit (INDEPENDENT_AMBULATORY_CARE_PROVIDER_SITE_OTHER): Admitting: Family Medicine

## 2019-03-31 DIAGNOSIS — Z20828 Contact with and (suspected) exposure to other viral communicable diseases: Secondary | ICD-10-CM

## 2019-03-31 DIAGNOSIS — Z20822 Contact with and (suspected) exposure to covid-19: Secondary | ICD-10-CM

## 2019-03-31 DIAGNOSIS — J029 Acute pharyngitis, unspecified: Secondary | ICD-10-CM | POA: Diagnosis not present

## 2019-03-31 NOTE — Telephone Encounter (Signed)
Front Office: Please contact patient to schedule virtual appointment to discuss testing  Dr. Yong Channel Team please be advised.

## 2019-03-31 NOTE — Progress Notes (Signed)
Virtual Visit via Video Note  I connected with Michael [name]  on 03/31/19 at 11:40 AM EDT by a video enabled telemedicine application and verified that I am speaking with the correct person using two identifiers.  Location patient: home Location provider:work or home office Persons participating in the virtual visit: patient, provider   HPI:  Acute visit for COVID19 Exposure: -daughter has been sick last recently and was tested for Ina a few days ago and was positive -had a sore throat  On and off the last 4-5 days, was minor -Denies: UR symptoms, fever, cough, congestion, SOB, NVD, body aches, rashes, loss of taste -Pt has PMH heart dz, CAD, HTN, HLD -wants COVID19 testing  ROS: See pertinent positives and negatives per HPI.  Past Medical History:  Diagnosis Date  . CAD S/P PCI to Cx & RCA    a. s/p PCI of LCx (2006) and ostium of RCA (2007) as well as DES to RCA in [place] (05/2014)  . HTN (hypertension)   . Hyperlipidemia     Past Surgical History:  Procedure Laterality Date  . CARDIAC CATHETERIZATION N/A 05/06/2016   Procedure: Left Heart Cath and Coronary Angiography;  Surgeon: Nelva Bush, MD;  Location: [facility];  Service: Cardiovascular;  Laterality: N/A;  . CARDIAC CATHETERIZATION N/A 05/06/2016   Procedure: Coronary Stent Intervention;  Surgeon: Nelva Bush, MD;  Location: [facility];  Service: Cardiovascular;  Laterality: N/A;  . CARDIAC CATHETERIZATION N/A 05/13/2016   Procedure: Coronary/Graft Angiography;  Surgeon: Peter M Martinique, MD;  Location: [facility];  Service: Cardiovascular;  Laterality: N/A;  . CORONARY ANGIOPLASTY WITH STENT PLACEMENT  2006   in [place], occluded CFX, s/p Taxus stent  . CORONARY ANGIOPLASTY WITH STENT PLACEMENT  2007   in [place], ostial RCA stent  . CORONARY ANGIOPLASTY WITH STENT PLACEMENT  2015   in [place], RCA stent  . TONSILLECTOMY AND ADENOIDECTOMY       Family History  Problem  Relation Age of Onset  . Hypertension Father   . Dementia Father        Vascular. patient states dementia/possible alzheimers as well  . Heart disease Mother        died in 21s  . Hyperlipidemia Sister   . Cancer Brother        type unknown  . Cancer Brother        type unknown  . Heart disease Maternal Grandfather 21       Died probably of heart diseasse  . Heart disease Maternal Uncle 98       Died of "heart exploding"  . Cerebral palsy Daughter   . Stroke Son        severe problems with blood clotting    SOCIAL HX: see hpi   Current Outpatient Medications:  .  aspirin 81 MG tablet, Take 81 mg by mouth daily., Disp: , Rfl:  .  atorvastatin (LIPITOR) 80 MG tablet, TAKE 1 TABLET DAILY, Disp: 90 tablet, Rfl: 1 .  BRILINTA 90 MG TABS tablet, TAKE 1 TABLET TWICE A DAY (Patient taking differently: Take 90 mg by mouth 2 (two) times daily. ), Disp: 180 tablet, Rfl: 3 .  CHERRY PO, Take 50 mg by mouth daily. Cherry extract, Disp: , Rfl:  .  Cinnamon 500 MG capsule, Take 500 mg by mouth 2 (two) times daily. , Disp: , Rfl:  .  isosorbide mononitrate (IMDUR) 60 MG 24 hr tablet, TAKE 1 TABLET DAILY, Disp: 90  tablet, Rfl: 1 .  nitroGLYCERIN (NITROSTAT) 0.4 MG SL tablet, DISSOLVE 1 TABLET UNDER THE TONGUE EVERY 5 MINUTES AS NEEDED FOR CHEST PAIN UP TO 3 DOSES (Patient taking differently: Place 0.4 mg under the tongue every 5 (five) minutes as needed for chest pain. ), Disp: 1 tablet, Rfl: 12 .  Prasterone, DHEA, (DHEA 50 PO), Take 1 capsule by mouth daily. Take for 4 weeks, then off for 2 weeks, Disp: , Rfl:  .  tiZANidine (ZANAFLEX) 4 MG tablet, Take 4 mg by mouth at bedtime. , Disp: , Rfl:  .  Turmeric 500 MG CAPS, Take 500 mg by mouth daily. , Disp: , Rfl:  .  Vitamin D, Ergocalciferol, (DRISDOL) 1.25 MG (50000 UT) CAPS capsule, TAKE 1 CAPSULE EVERY 7 DAYS (Patient taking differently: Take 50,000 Units by mouth every Sunday. ), Disp: 12 capsule, Rfl: 4  EXAM:  VITALS per patient if  applicable:  GENERAL: alert, oriented, appears well and in no acute distress  HEENT: atraumatic, conjunttiva clear, no obvious abnormalities on inspection of external nose and ears  NECK: normal movements of the head and neck  LUNGS: on inspection no signs of respiratory distress, breathing rate appears normal, no obvious gross SOB, gasping or wheezing  CV: no obvious cyanosis  MS: moves all visible extremities without noticeable abnormality  PSYCH/NEURO: pleasant and cooperative, no obvious depression or anxiety, speech and thought processing grossly intact  ASSESSMENT AND PLAN:  Discussed the following assessment and plan:  Close Exposure to 2019 Novel Coronavirus  Sore throat   He feels the sore throat is his baseline and likely allergies. He does want testing for COVID19 which is warranted given the exposure and he is high risks for complications. Sent referral to [facility] for COVID testing. Advised self quarantine per CDC guidelines and precautions.   I discussed the assessment and treatment plan with the patient. The patient was provided an opportunity to ask questions and all were answered. The patient agreed with the plan and demonstrated an understanding of the instructions.   The patient was advised to call back or seek an in-person evaluation if the symptoms worsen or if the condition fails to improve as anticipated.  Patient Instructions  I sent a referral for Coronavirus (COVID19) testing for you. Please call our office if you have any concerns or questions or this testing has not been arranged in the next 24-48 hours.   Self Isolate: -see the CDC site for information:   RunningShows.co.za.html   -stay home except for to seek medical care -stay in your own room away from others in your house, wash hands frequently, wear a mask if you leave your room and interacted as little as possible with others -seek medical  care if worsening - call our office for a visit or call ahead if going elsewhere -seek emergency care if very sick or severe symptoms - call 911 -isolate for at least 10 days from the onset of symptoms PLUS 3 days of no fever PLUS 3 days of improving symptoms, unless instructed otherwise by a doctor.   Follow up if symptoms arise or your have a positive test or any questions or concerns.     Lucretia Kern, DO

## 2019-03-31 NOTE — Telephone Encounter (Signed)
Dr. Maudie Mercury requests COVID 19 test. Scheduled for tomorrow.

## 2019-03-31 NOTE — Patient Instructions (Signed)
I sent a referral for Coronavirus (COVID19) testing for you. Please call our office if you have any concerns or questions or this testing has not been arranged in the next 24-48 hours.   Self Isolate: -see the CDC site for information:   [email or web address].co.za.html   -stay home except for to seek medical care -stay in your own room away from others in your house, wash hands frequently, wear a mask if you leave your room and interacted as little as possible with others -seek medical care if worsening - call our office for a visit or call ahead if going elsewhere -seek emergency care if very sick or severe symptoms - call 911 -isolate for at least 10 days from the onset of symptoms PLUS 3 days of no fever PLUS 3 days of improving symptoms, unless instructed otherwise by a doctor.   Follow up if symptoms arise or your have a positive test or any questions or concerns.

## 2019-03-31 NOTE — Telephone Encounter (Signed)
Patient is scheduled for 03/31/2019 at 11:40am with Dr. Maudie Mercury for a Virtual Visit.

## 2019-03-31 NOTE — Telephone Encounter (Signed)
   Reason for Disposition . [1] COVID-19 EXPOSURE (Close Contact) AND [2] within last 14 days BUT [3] NO symptoms  Answer Assessment - Initial Assessment Questions 1. CLOSE CONTACT: "Who is the person with the confirmed or suspected COVID-19 infection that you were exposed to?"     Exposure to positive 2. PLACE of CONTACT: "Where were you when you were exposed to COVID-19?" (e.g., home, school, medical waiting room; which city?)     Daughters home/patient home 3. TYPE of CONTACT: "How much contact was there?" (e.g., sitting next to, live in same house, work in same office, same building)     [place] 4. DURATION of CONTACT: "How long were you in contact with the COVID-19 patient?" (e.g., a few seconds, passed by person, a few minutes, live with the patient)     Once daily- tries to stay at distance- but is around her multiple times a day 5. DATE of CONTACT: "When did you have contact with a COVID-19 patient?" (e.g., how many days ago)     yesterday 6. TRAVEL: "Have you traveled out of the country recently?" If so, "When and where?"     * Also ask about out-of-state travel, since the CDC has identified some high-risk cities for community spread in the [place].     * Note: Travel becomes less relevant if there is widespread community transmission where the patient lives.     no 7. COMMUNITY SPREAD: "Are there lots of cases of COVID-19 (community spread) where you live?" (See public health department website, if unsure)       minor 8. SYMPTOMS: "Do you have any symptoms?" (e.g., fever, cough, breathing difficulty)     no 9. PREGNANCY OR POSTPARTUM: "Is there any chance you are pregnant?" "When was your last menstrual period?" "Did you deliver in the last 2 weeks?"     n/a 10. HIGH RISK: "Do you have any heart or lung problems? Do you have a weak immune system?" (e.g., CHF, COPD, asthma, HIV positive, chemotherapy, renal failure, diabetes mellitus, sickle cell anemia)       Heart  condition  Protocols used: CORONAVIRUS (COVID-19) EXPOSURE-A-AH

## 2019-03-31 NOTE — Telephone Encounter (Signed)
Please call pt to schedule a virtual visit

## 2019-03-31 NOTE — Telephone Encounter (Signed)
-----   Message from Lucretia Kern, DO sent at 03/31/2019 11:52 AM EDT ----- Please test for COVID19. High risk for complications. Close exposure to known COVID + case. Mild sore throat.

## 2019-04-01 ENCOUNTER — Other Ambulatory Visit

## 2019-04-01 DIAGNOSIS — Z20822 Contact with and (suspected) exposure to covid-19: Secondary | ICD-10-CM

## 2019-04-05 LAB — NOVEL CORONAVIRUS, NAA: SARS-CoV-2, NAA: NOT DETECTED

## 2019-04-19 ENCOUNTER — Ambulatory Visit (INDEPENDENT_AMBULATORY_CARE_PROVIDER_SITE_OTHER): Admitting: Family Medicine

## 2019-04-19 ENCOUNTER — Other Ambulatory Visit: Payer: Self-pay

## 2019-04-19 ENCOUNTER — Encounter: Payer: Self-pay | Admitting: Family Medicine

## 2019-04-19 VITALS — BP 110/60 | HR 67 | Ht 67.0 in | Wt 199.0 lb

## 2019-04-19 DIAGNOSIS — M549 Dorsalgia, unspecified: Secondary | ICD-10-CM

## 2019-04-19 DIAGNOSIS — G8929 Other chronic pain: Secondary | ICD-10-CM

## 2019-04-19 DIAGNOSIS — M999 Biomechanical lesion, unspecified: Secondary | ICD-10-CM | POA: Diagnosis not present

## 2019-04-19 NOTE — Assessment & Plan Note (Signed)
Decision today to treat with OMT was based on Physical Exam  After verbal consent patient was treated with HVLA, ME, FPR techniques in  thoracic, lumbar and sacral areas  Patient tolerated the procedure well with improvement in symptoms  Patient given exercises, stretches and lifestyle modifications  See medications in patient instructions if given  Patient will follow up in 4-8 weeks 

## 2019-04-19 NOTE — Assessment & Plan Note (Signed)
Stable.  Discussed posture and ergonomics probably which activities of doing which was to avoid.  Follow-up again in 4 to 8 weeks

## 2019-04-19 NOTE — Progress Notes (Signed)
[name] [address] [place], [place] [address] Phone: [phone] Subjective:   I [name] am serving as a Education administrator for Dr. Hulan Saas.  I'm seeing this patient by the request  of:    CC: Low back pain follow-up  KCL:[id number]  Michael [name] is a 63 y.o. male coming in with complaint of back pain. States his back is doing better. Hasn't been doing as many exercises. Left sided lower back pain. Patient is limping quite as active but states that the pain has been nursing well tolerated at the moment.  Patient denies any radiation down the legs.  Feels well he needs a little bit more direction.     Past Medical History:  Diagnosis Date  . CAD S/P PCI to Cx & RCA    a. s/p PCI of LCx (2006) and ostium of RCA (2007) as well as DES to RCA in [place] (05/2014)  . HTN (hypertension)   . Hyperlipidemia    Past Surgical History:  Procedure Laterality Date  . CARDIAC CATHETERIZATION N/A 05/06/2016   Procedure: Left Heart Cath and Coronary Angiography;  Surgeon: Nelva Bush, MD;  Location: [facility];  Service: Cardiovascular;  Laterality: N/A;  . CARDIAC CATHETERIZATION N/A 05/06/2016   Procedure: Coronary Stent Intervention;  Surgeon: Nelva Bush, MD;  Location: [facility];  Service: Cardiovascular;  Laterality: N/A;  . CARDIAC CATHETERIZATION N/A 05/13/2016   Procedure: Coronary/Graft Angiography;  Surgeon: Peter M Martinique, MD;  Location: [facility];  Service: Cardiovascular;  Laterality: N/A;  . CORONARY ANGIOPLASTY WITH STENT PLACEMENT  2006   in [place], occluded CFX, s/p Taxus stent  . CORONARY ANGIOPLASTY WITH STENT PLACEMENT  2007   in [place], ostial RCA stent  . CORONARY ANGIOPLASTY WITH STENT PLACEMENT  2015   in [place], RCA stent  . TONSILLECTOMY AND ADENOIDECTOMY      Social History   Socioeconomic History  . Marital status: Married    Spouse name: Not on file  . Number of  children: 4  . Years of education: Not on file  . Highest education level: Not on file  Occupational History  . Occupation: retired  Scientific laboratory technician  . Financial resource strain: Not on file  . Food insecurity    Worry: Not on file    Inability: Not on file  . Transportation needs    Medical: Not on file    Non-medical: Not on file  Tobacco Use  . Smoking status: Never Smoker  . Smokeless tobacco: Never Used  Substance and Sexual Activity  . Alcohol use: No    Alcohol/week: 0.0 standard drinks  . Drug use: No  . Sexual activity: Yes  Lifestyle  . Physical activity    Days per week: Not on file    Minutes per session: Not on file  . Stress: Not on file  Relationships  . Social Herbalist on phone: Not on file    Gets together: Not on file    Attends religious service: Not on file    Active member of club or organization: Not on file    Attends meetings of clubs or organizations: Not on file    Relationship status: Not on file  Other Topics Concern  . Not on file  Social History Narrative   Lives with wife.  4 kids total (2 step, 2 biological). 1 that has CP. Daughter [name]- comes to [facility]).  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 Hypertension Father   . Dementia Father        Vascular. patient states dementia/possible alzheimers as well  . Heart disease Mother        died in 45s  . Hyperlipidemia Sister   . Cancer Brother        type unknown  . Cancer Brother        type unknown  . Heart disease Maternal Grandfather 14       Died probably of heart diseasse  . Heart disease Maternal Uncle 53       Died of "heart exploding"  . Cerebral palsy Daughter   . Stroke Son        severe problems with blood clotting     Current Outpatient Medications (Cardiovascular):  .  atorvastatin (LIPITOR) 80 MG  tablet, TAKE 1 TABLET DAILY .  isosorbide mononitrate (IMDUR) 60 MG 24 hr tablet, TAKE 1 TABLET DAILY .  nitroGLYCERIN (NITROSTAT) 0.4 MG SL tablet, DISSOLVE 1 TABLET UNDER THE TONGUE EVERY 5 MINUTES AS NEEDED FOR CHEST PAIN UP TO 3 DOSES (Patient taking differently: Place 0.4 mg under the tongue every 5 (five) minutes as needed for chest pain. )   Current Outpatient Medications (Analgesics):  .  aspirin 81 MG tablet, Take 81 mg by mouth daily.  Current Outpatient Medications (Hematological):  [name]  BRILINTA 90 MG TABS tablet, TAKE 1 TABLET TWICE A DAY (Patient taking differently: Take 90 mg by mouth 2 (two) times daily. )  Current Outpatient Medications (Other):  [name]  CHERRY PO, Take 50 mg by mouth daily. [place] extract .  Cinnamon 500 MG capsule, Take 500 mg by mouth 2 (two) times daily.  .  Prasterone, DHEA, (DHEA 50 PO), Take 1 capsule by mouth daily. Take for 4 weeks, then off for 2 weeks .  tiZANidine (ZANAFLEX) 4 MG tablet, Take 4 mg by mouth at bedtime.  .  Turmeric 500 MG CAPS, Take 500 mg by mouth daily.  .  Vitamin D, Ergocalciferol, (DRISDOL) 1.25 MG (50000 UT) CAPS capsule, TAKE 1 CAPSULE EVERY 7 DAYS (Patient taking differently: Take 50,000 Units by mouth every Sunday. )    Past medical history, social, surgical and family history all reviewed in electronic medical record.  No pertanent information unless stated regarding to the chief complaint.   Review of Systems:  No headache, visual changes, nausea, vomiting, diarrhea, constipation, dizziness, abdominal pain, skin rash, fevers, chills, night sweats, weight loss, swollen lymph nodes, body aches, joint swelling,  chest pain, shortness of breath, mood changes.  Positive muscle aches  Objective  Blood pressure 110/60, pulse 67, height 5\' 7"  (1.702 m), weight 199 lb (90.3 kg), SpO2 97 %.    General: No apparent distress alert and oriented x3 mood and affect normal, dressed appropriately.  HEENT: Pupils equal, extraocular  movements intact  Respiratory: Patient's speak in full sentences and does not appear short of breath  Cardiovascular: No lower extremity edema, non tender, no erythema  Skin: vitiligo  Abdomen: Soft nontender  Neuro: Cranial nerves II through XII are intact, neurovascularly intact in all extremities with 2+ DTRs and 2+ pulses.  Lymph: No lymphadenopathy of posterior or anterior cervical chain or axillae bilaterally.  Gait normal with good balance and coordination.  MSK:  Non tender with  full range of motion and good stability and symmetric strength and tone of shoulders, elbows, wrist, hip, knee and ankles bilaterally.  Back Exam:  Inspection: Unremarkable  Motion: Flexion 45 deg, Extension 20 deg, Side Bending to 35 deg bilaterally,  Rotation to 45 deg bilaterally  SLR laying: Negative  XSLR laying: Negative  Palpable tenderness: Tender to palpation of paraspinal musculature lumbar spine. FABER: Tightness bilaterally. Sensory change: Gross sensation intact to all lumbar and sacral dermatomes.  Reflexes: 2+ at both patellar tendons, 2+ at achilles tendons, Babinski's downgoing.  Strength at foot  Plantar-flexion: 5/5 Dorsi-flexion: 5/5 Eversion: 5/5 Inversion: 5/5  Leg strength  Quad: 5/5 Hamstring: 5/5 Hip flexor: 5/5 Hip abductors: 5/5  Gait unremarkable.  Osteopathic findings  T9 extended rotated and side bent left L2 flexed rotated and side bent right Sacrum right on right    Impression and Recommendations:     This case required medical decision making of moderate complexity. The above documentation has been reviewed and is accurate and complete [name], DO       Note: This dictation was prepared with Dragon dictation along with smaller phrase technology. Any transcriptional errors that result from this process are unintentional.

## 2019-04-19 NOTE — Patient Instructions (Signed)
See me again in 6 weeks PT will call you

## 2019-05-23 ENCOUNTER — Ambulatory Visit: Admitting: Physical Therapy

## 2019-05-23 ENCOUNTER — Other Ambulatory Visit: Payer: Self-pay

## 2019-05-23 ENCOUNTER — Encounter: Payer: Self-pay | Admitting: Physical Therapy

## 2019-05-23 DIAGNOSIS — M25551 Pain in right hip: Secondary | ICD-10-CM

## 2019-05-23 DIAGNOSIS — M545 Low back pain, unspecified: Secondary | ICD-10-CM

## 2019-05-23 DIAGNOSIS — G8929 Other chronic pain: Secondary | ICD-10-CM

## 2019-05-23 NOTE — Therapy (Signed)
[facility] [address] [place], [place], [address] Phone: [phone]   Fax:  [phone]  Physical Therapy Evaluation  Patient Details  Name: Michael [name] MRN: [id number] Date of Birth: 02-05-1956 Referring Provider (PT): Charlann Boxer   Encounter Date: 05/23/2019  PT End of Session - 05/23/19 1157    Visit Number  1    Number of Visits  12    Date for PT Re-Evaluation  07/04/19    Authorization Type  [employer]    PT Start Time  0935    PT Stop Time  [id number]    PT Time Calculation (min)  39 min    Activity Tolerance  Patient tolerated treatment well    Behavior During Therapy  [facility] for tasks assessed/performed       Past Medical History:  Diagnosis Date  . CAD S/P PCI to Cx & RCA    a. s/p PCI of LCx (2006) and ostium of RCA (2007) as well as DES to RCA in [place] (05/2014)  . HTN (hypertension)   . Hyperlipidemia     Past Surgical History:  Procedure Laterality Date  . CARDIAC CATHETERIZATION N/A 05/06/2016   Procedure: Left Heart Cath and Coronary Angiography;  Surgeon: Nelva Bush, MD;  Location: [facility];  Service: Cardiovascular;  Laterality: N/A;  . CARDIAC CATHETERIZATION N/A 05/06/2016   Procedure: Coronary Stent Intervention;  Surgeon: Nelva Bush, MD;  Location: [facility];  Service: Cardiovascular;  Laterality: N/A;  . CARDIAC CATHETERIZATION N/A 05/13/2016   Procedure: Coronary/Graft Angiography;  Surgeon: Peter M Martinique, MD;  Location: [facility];  Service: Cardiovascular;  Laterality: N/A;  . CORONARY ANGIOPLASTY WITH STENT PLACEMENT  2006   in [place], occluded CFX, s/p Taxus stent  . CORONARY ANGIOPLASTY WITH STENT PLACEMENT  2007   in [place], ostial RCA stent  . CORONARY ANGIOPLASTY WITH STENT PLACEMENT  2015   in [place], RCA stent  . TONSILLECTOMY AND ADENOIDECTOMY       There were no vitals filed for this visit.   Subjective Assessment - 05/23/19  0935    Subjective  Pt states pain for about 2 years, pain has been on/off, has been doing HEP, but still bothering him. Pt states most pain at  L  thoracic region and R hip pain/glute. Standing, walking feels good, but has pain/tightness after he's done. Walks up to an hour.  Increased pain at end of the day.  Pt is retired, is active at home.    Limitations  Sitting;House hold activities    Patient Stated Goals  decreased pain    Currently in Pain?  Yes    Pain Score  3     Pain Location  Back    Pain Orientation  Mid;Left    Pain Descriptors / Indicators  Tightness;Sore    Pain Type  Chronic pain    Pain Onset  More than a month ago    Pain Frequency  Intermittent    Multiple Pain Sites  Yes    Pain Score  4    Pain Location  Hip    Pain Orientation  Right    Pain Descriptors / Indicators  Aching    Pain Type  Chronic pain    Pain Onset  More than a month ago    Pain Frequency  Intermittent         [facility] PT Assessment - 05/23/19 0001      Assessment  Medical Diagnosis  Back Pain    Referring Provider (PT)  Charlann Boxer    Hand Dominance  Right    Prior Therapy  NO      Balance Screen   Has the patient fallen in the past 6 months  No      Prior Function   Level of Independence  Independent      Cognition   Overall Cognitive Status  Within Functional Limits for tasks assessed      Posture/Postural Control   Posture Comments  Standing: L shoulder lower, slightly retracted,  L hip slightly lower.   Supine: leg length appears even;       ROM / Strength   AROM / PROM / Strength  AROM;Strength      AROM   Overall AROM Comments  Lumbar ROM: All WFL, pain with R SB;  Hips: mild limitation for rotation bil;       Strength   Overall Strength Comments  Hips: 4+/5, Core: 3/5,       Palpation   Palpation comment  Tightness at Bil thoracic and high lumbar paraspinals, Tightness and tenderness in R glute, piriformis, and into R SI.; Hypomobile thoracic and lumbar spine with  spring testing      Special Tests   Other special tests  L hip tests negative;  Neg SLR,                 Objective measurements completed on examination: See above findings.      [facility] Adult PT Treatment/Exercise - 05/23/19 0001      Ambulation/Gait   Gait Comments  --      Exercises   Exercises  Lumbar      Lumbar Exercises: Stretches   Single Knee to Chest Stretch  3 reps;30 seconds;Right;Left    Standing Side Bend  2 reps;30 seconds;Right;Left    Standing Side Bend Limitations  at wall    Piriformis Stretch  3 reps;30 seconds;Right;Left    Piriformis Stretch Limitations  Supine mod fig 4 and seated     Other Lumbar Stretch Exercise  Side glides to the L x15;              PT Education - 05/23/19 1157    Education Details  PT POC, Exam findings, Initial HEP    Person(s) Educated  Patient    Methods  Explanation;Demonstration;Handout    Comprehension  Verbalized understanding;Returned demonstration;Verbal cues required;Tactile cues required;Need further instruction       PT Short Term Goals - 05/23/19 1203      PT SHORT TERM GOAL #1   Title  Pt to be independent with initial HEP    Time  2    Period  Weeks    Status  New    Target Date  06/06/19      PT SHORT TERM GOAL #2   Title  Pt to demo improved pain in L thoracic region to 2/10    Time  2    Period  Weeks    Status  New    Target Date  06/06/19        PT Long Term Goals - 05/23/19 1204      PT LONG TERM GOAL #1   Title  Pt to demo improved ROM for lumbar spine to be pain free in all motions.    Time  6    Period  Weeks    Status  New    Target Date  07/04/19      PT LONG TERM GOAL #2   Title  Pt to demo improved pain in L thoracic and R glute region, to be 0-2/10 with activity    Time  6    Period  Weeks    Status  New    Target Date  07/04/19      PT LONG TERM GOAL #3   Title  Pt to demo increased strength and stability of core to be WNL, with minimal/no postural changes  with stability exercises, to improve safety with functional activity    Time  6    Period  Weeks    Target Date  07/04/19      PT LONG TERM GOAL #4   Title  Pt to be independent with final HEP for back pain.    Time  6    Period  Weeks    Target Date  07/04/19             Plan - 05/23/19 1313    Clinical Impression Statement  Pt presents with primary complaint of increased pain in back and hip. Pt with increased tightness in L thoracic region, but has minimal pain there today. Pt with increased pain in R glute and SI, with tightness and trigger points in glute . He has poor gait mechanics, with increased rotation on L vs R, and likely getting pain/tightness on L,  due to posutre with ambulation. Pt with decreased core strength, and lack of effective HEP. Pt to benefit from skilled PT to improve deficits and pain.    Personal Factors and Comorbidities  Time since onset of injury/illness/exacerbation    Examination-Activity Limitations  Locomotion Level;Bend;Sit    Examination-Participation Restrictions  Community Activity;Yard Work    Stability/Clinical Decision Making  Stable/Uncomplicated    Designer, jewellery  Low    Rehab Potential  Good    PT Frequency  2x / week    PT Duration  6 weeks    PT Treatment/Interventions  ADLs/Self Care Home Management;Electrical Stimulation;Cryotherapy;DME Instruction;Ultrasound;Traction;Moist Heat;Iontophoresis 4mg /ml Dexamethasone;Gait training;Stair training;Functional mobility training;Therapeutic activities;Therapeutic exercise;Neuromuscular re-education;Patient/family education;Manual techniques;Dry needling;Passive range of motion;Taping;Spinal Manipulations;Joint Manipulations    Consulted and Agree with Plan of Care  Patient       Patient will benefit from skilled therapeutic intervention in order to improve the following deficits and impairments:  Abnormal gait, Decreased range of motion, Increased muscle spasms, Decreased endurance,  Decreased activity tolerance, Pain, Improper body mechanics, Impaired flexibility, Decreased strength  Visit Diagnosis: Chronic bilateral low back pain without sciatica  Pain in right hip     Problem List Patient Active Problem List   Diagnosis Date Noted  . Angina at rest [facility]) 12/16/2018  . History of adenomatous polyp of colon 06/07/2018  . BPH (benign prostatic hyperplasia) 06/07/2018  . Vitiligo 06/07/2018  . Gluteal pain 04/10/2018  . Plantar fasciitis 10/12/2017  . Back pain 04/13/2017  . Nonallopathic lesion of thoracic region 04/13/2017  . Nonallopathic lesion of sacral region 04/13/2017  . Nonallopathic lesion of lumbosacral region 04/13/2017  . Impaired glucose tolerance 11/03/2016  . Obesity   . HTN (hypertension)   . CAD S/P multiple PCIs 09/08/2014  . Dyslipidemia 09/08/2014    Lyndee Hensen, PT, DPT 1:24 PM  05/23/19    [facility] [place], [place], [address] Phone: [phone]   Fax:  [phone]  Name: Linden Peppler MRN: [id number] Date of Birth: August 22, 1956

## 2019-05-23 NOTE — Patient Instructions (Signed)
Access Code: [email or web address]  URL: [email or web address]  Date: 05/23/2019  Prepared by: Lyndee Hensen   Exercises Single Knee to Chest Stretch - 3 reps - 30 hold - 2x daily Supine Figure 4 Piriformis Stretch - 3 reps - 30 hold - 2x daily Seated Piriformis Stretch with Trunk Bend - 3 reps - 30 hold - 2x daily Standing Sidebending with Chair Support - 3 reps - 30 hold - 2x daily Left Standing Lateral Shift Correction at [place] - 10 reps - 2 sets - 2x daily

## 2019-05-26 ENCOUNTER — Encounter: Admitting: Physical Therapy

## 2019-05-31 ENCOUNTER — Encounter: Payer: Self-pay | Admitting: Physical Therapy

## 2019-05-31 ENCOUNTER — Ambulatory Visit: Admitting: Physical Therapy

## 2019-05-31 ENCOUNTER — Other Ambulatory Visit: Payer: Self-pay

## 2019-05-31 DIAGNOSIS — M25551 Pain in right hip: Secondary | ICD-10-CM | POA: Diagnosis not present

## 2019-05-31 DIAGNOSIS — G8929 Other chronic pain: Secondary | ICD-10-CM

## 2019-05-31 DIAGNOSIS — M545 Low back pain, unspecified: Secondary | ICD-10-CM

## 2019-05-31 NOTE — Patient Instructions (Signed)
Access Code: [email or web address]  URL: [email or web address]  Date: 05/31/2019  Prepared by: Lyndee Hensen   Exercises Single Knee to Chest Stretch - 3 reps - 30 hold - 2x daily Supine Figure 4 Piriformis Stretch - 3 reps - 30 hold - 2x daily Seated Piriformis Stretch with Trunk Bend - 3 reps - 30 hold - 2x daily Standing Sidebending with Chair Support - 3 reps - 30 hold - 2x daily Left Standing Lateral Shift Correction at Wall - Repetitions - 10 reps - 2 sets - 2x daily Supine March - 10 reps - 2 sets - 1x daily Hooklying Clamshell with Resistance - 10 reps - 2 sets - 1x daily Sidelying Hip Abduction - 10 reps - 2 sets - 1x daily

## 2019-05-31 NOTE — Therapy (Signed)
[facility] [address] [place], [place], [address] Phone: [phone]   Fax:  [phone]  Physical Therapy Treatment  Patient Details  Name: Michael [name] MRN: [id number] Date of Birth: [birth year] Referring Provider (PT): Charlann Boxer   Encounter Date: 05/31/2019  PT End of Session - 05/31/19 0932    Visit Number  2    Number of Visits  12    Date for PT Re-Evaluation  07/04/19    Authorization Type  [employer]    PT Start Time  0848    PT Stop Time  0931    PT Time Calculation (min)  43 min    Activity Tolerance  Patient tolerated treatment well    Behavior During Therapy  [facility] for tasks assessed/performed       Past Medical History:  Diagnosis Date  . CAD S/P PCI to Cx & RCA    a. s/p PCI of LCx (2006) and ostium of RCA (2007) as well as [place] to [place] (05/2014)  . HTN (hypertension)   . Hyperlipidemia     Past Surgical History:  Procedure Laterality Date  . CARDIAC CATHETERIZATION N/A 05/06/2016   Procedure: Left Heart Cath and Coronary Angiography;  Surgeon: Nelva Bush, MD;  Location: [facility];  Service: Cardiovascular;  Laterality: N/A;  . CARDIAC CATHETERIZATION N/A 05/06/2016   Procedure: Coronary Stent Intervention;  Surgeon: Nelva Bush, MD;  Location: [facility];  Service: Cardiovascular;  Laterality: N/A;  . CARDIAC CATHETERIZATION N/A 05/13/2016   Procedure: Coronary/Graft Angiography;  Surgeon: Peter M Martinique, MD;  Location: [facility];  Service: Cardiovascular;  Laterality: N/A;  . CORONARY ANGIOPLASTY WITH STENT PLACEMENT  2006   in [place], occluded CFX, s/p Taxus stent  . CORONARY ANGIOPLASTY WITH STENT PLACEMENT  2007   in [place], ostial RCA stent  . CORONARY ANGIOPLASTY WITH STENT PLACEMENT  2015   in [place], RCA stent  . TONSILLECTOMY AND ADENOIDECTOMY       There were no vitals filed for this visit.  Subjective Assessment - 05/31/19 0948     Subjective  Pt with no new complaints. Has been doing HEP but is "hit or miss".    Currently in Pain?  Yes    Pain Score  3     Pain Location  Back    Pain Orientation  Left;Mid    Pain Descriptors / Indicators  Tightness;Sore    Pain Type  Chronic pain    Pain Onset  More than a month ago    Pain Frequency  Intermittent    Pain Score  4    Pain Location  Hip    Pain Orientation  Right    Pain Descriptors / Indicators  Aching    Pain Type  Chronic pain    Pain Onset  More than a month ago    Pain Frequency  Intermittent                       [facility] Adult PT Treatment/Exercise - 05/31/19 0851      Posture/Postural Control   Posture Comments  Standing: L shoulder lower, slightly retracted,  L hip slightly lower.   Supine: leg length appears even;       Exercises   Exercises  Lumbar      Lumbar Exercises: Stretches   Active Hamstring Stretch  2 reps;30 seconds    Active Hamstring Stretch Limitations  seated  Single Knee to Chest Stretch  30 seconds;Right;Left;2 reps    Standing Side Bend  2 reps;30 seconds;Right;Left    Standing Side Bend Limitations  at wall    Piriformis Stretch  3 reps;30 seconds;Right;Left    Piriformis Stretch Limitations  seated     Other Lumbar Stretch Exercise  hip flexor st, thomas test position (off side of table for HEP) x3 min.     Other Lumbar Stretch Exercise  Seated thoracic rotation x15;       Lumbar Exercises: Aerobic   Stationary Bike  L1 x 4 min, L2 x4  min      Lumbar Exercises: Supine   Ab Set  10 reps    Clam  20 reps    Clam Limitations  GTB with TA    Bent Knee Raise  20 reps    Bent Knee Raise Limitations  with TA    Straight Leg Raise  10 reps    Straight Leg Raises Limitations  bil      Lumbar Exercises: Sidelying   Hip Abduction  20 reps;Both      Manual Therapy   Manual Therapy  Joint mobilization    Joint Mobilization  PA mobs for T and L spine. gr 3              PT Education - 05/31/19  [id number]    Education Details  HEP updated.    Person(s) Educated  Patient    Methods  Explanation;Handout    Comprehension  Verbalized understanding;Need further instruction;Returned demonstration       PT Short Term Goals - 05/23/19 1203      PT SHORT TERM GOAL #1   Title  Pt to be independent with initial HEP    Time  2    Period  Weeks    Status  New    Target Date  06/06/19      PT SHORT TERM GOAL #2   Title  Pt to demo improved pain in L thoracic region to 2/10    Time  2    Period  Weeks    Status  New    Target Date  06/06/19        PT Long Term Goals - 05/23/19 1204      PT LONG TERM GOAL #1   Title  Pt to demo improved ROM for lumbar spine to be pain free in all motions.    Time  6    Period  Weeks    Status  New    Target Date  07/04/19      PT LONG TERM GOAL #2   Title  Pt to demo improved pain in L thoracic and R glute region, to be 0-2/10 with activity    Time  6    Period  Weeks    Status  New    Target Date  07/04/19      PT LONG TERM GOAL #3   Title  Pt to demo increased strength and stability of core to be WNL, with minimal/no postural changes with stability exercises, to improve safety with functional activity    Time  6    Period  Weeks    Target Date  07/04/19      PT LONG TERM GOAL #4   Title  Pt to be independent with final HEP for back pain.    Time  6    Period  Weeks    Target Date  07/04/19  Plan - 05/31/19 0949    Clinical Impression Statement  THer ex progressed today , HEP updated. Added core and hip strength/stabilization. Pt with hypomobile t-spine, noted with gait as well, given rotation stretch for HEP as well. Plan to progress as tolerated.    Personal Factors and Comorbidities  Time since onset of injury/illness/exacerbation    Examination-Activity Limitations  Locomotion Level;Bend;Sit    Examination-Participation Restrictions  Community Activity;Yard Work    Stability/Clinical Decision Making   Stable/Uncomplicated    Rehab Potential  Good    PT Frequency  2x / week    PT Duration  6 weeks    PT Treatment/Interventions  ADLs/Self Care Home Management;Electrical Stimulation;Cryotherapy;DME Instruction;Ultrasound;Traction;Moist Heat;Iontophoresis 4mg /ml Dexamethasone;Gait training;Stair training;Functional mobility training;Therapeutic activities;Therapeutic exercise;Neuromuscular re-education;Patient/family education;Manual techniques;Dry needling;Passive range of motion;Taping;Spinal Manipulations;Joint Manipulations    Consulted and Agree with Plan of Care  Patient       Patient will benefit from skilled therapeutic intervention in order to improve the following deficits and impairments:  Abnormal gait, Decreased range of motion, Increased muscle spasms, Decreased endurance, Decreased activity tolerance, Pain, Improper body mechanics, Impaired flexibility, Decreased strength  Visit Diagnosis: Chronic bilateral low back pain without sciatica  Pain in right hip     Problem List Patient Active Problem List   Diagnosis Date Noted  . Angina at rest [facility]) 12/16/2018  . History of adenomatous polyp of colon 06/07/2018  . BPH (benign prostatic hyperplasia) 06/07/2018  . Vitiligo 06/07/2018  . Gluteal pain 04/10/2018  . Plantar fasciitis 10/12/2017  . Back pain 04/13/2017  . Nonallopathic lesion of thoracic region 04/13/2017  . Nonallopathic lesion of sacral region 04/13/2017  . Nonallopathic lesion of lumbosacral region 04/13/2017  . Impaired glucose tolerance 11/03/2016  . Obesity   . HTN (hypertension)   . CAD S/P multiple PCIs 09/08/2014  . Dyslipidemia 09/08/2014    Lyndee Hensen, PT, DPT 9:52 AM  05/31/19    [facility] [place], [place], [address] Phone: [phone]   Fax:  [phone]  Name: Michael [name] MRN: [id number] Date of Birth: 06/22/1956

## 2019-06-02 ENCOUNTER — Ambulatory Visit: Admitting: Family Medicine

## 2019-06-02 ENCOUNTER — Other Ambulatory Visit: Payer: Self-pay

## 2019-06-02 ENCOUNTER — Encounter: Payer: Self-pay | Admitting: Physical Therapy

## 2019-06-02 ENCOUNTER — Ambulatory Visit: Admitting: Physical Therapy

## 2019-06-02 DIAGNOSIS — M545 Low back pain, unspecified: Secondary | ICD-10-CM

## 2019-06-02 DIAGNOSIS — M25551 Pain in right hip: Secondary | ICD-10-CM | POA: Diagnosis not present

## 2019-06-02 DIAGNOSIS — G8929 Other chronic pain: Secondary | ICD-10-CM | POA: Diagnosis not present

## 2019-06-02 NOTE — Progress Notes (Signed)
[name] [address] [place], [place] [address] Phone: [phone] Subjective:   Michael [name], am serving as a scribe for Dr. Hulan Saas.  CC: Back pain follow-up  [id number]  Michael [name] is a 63 y.o. male coming in with complaint of back pain. Last seen on 04/19/2019. Patient states that he is doing better. Is doing physical therapy 2x a week.  Patient feels like he is making progress.  Feels like the physical therapy has been helpful.  Is starting dry needling next week.  Has responded fairly well to osteopathic manipulation was done regularly as well.      Past Medical History:  Diagnosis Date  . CAD S/P PCI to Cx & RCA    a. s/p PCI of LCx (2006) and ostium of RCA (2007) as well as DES to RCA in [place] (05/2014)  . HTN (hypertension)   . Hyperlipidemia    Past Surgical History:  Procedure Laterality Date  . CARDIAC CATHETERIZATION N/A 05/06/2016   Procedure: Left Heart Cath and Coronary Angiography;  Surgeon: Nelva Bush, MD;  Location: [facility];  Service: Cardiovascular;  Laterality: N/A;  . CARDIAC CATHETERIZATION N/A 05/06/2016   Procedure: Coronary Stent Intervention;  Surgeon: Nelva Bush, MD;  Location: [facility];  Service: Cardiovascular;  Laterality: N/A;  . CARDIAC CATHETERIZATION N/A 05/13/2016   Procedure: Coronary/Graft Angiography;  Surgeon: Peter M Martinique, MD;  Location: [facility];  Service: Cardiovascular;  Laterality: N/A;  . CORONARY ANGIOPLASTY WITH STENT PLACEMENT  2006   in [place], occluded CFX, s/p Taxus stent  . CORONARY ANGIOPLASTY WITH STENT PLACEMENT  2007   in [place], ostial RCA stent  . CORONARY ANGIOPLASTY WITH STENT PLACEMENT  2015   in [place], RCA stent  . TONSILLECTOMY AND ADENOIDECTOMY      Social History   Socioeconomic History  . Marital status: Married    Spouse name: Not on file  . Number of children: 4  . Years of education: Not  on file  . Highest education level: Not on file  Occupational History  . Occupation: retired  Scientific laboratory technician  . Financial resource strain: Not on file  . Food insecurity    Worry: Not on file    Inability: Not on file  . Transportation needs    Medical: Not on file    Non-medical: Not on file  Tobacco Use  . Smoking status: Never Smoker  . Smokeless tobacco: Never Used  Substance and Sexual Activity  . Alcohol use: [name]    Alcohol/week: 0.0 standard drinks  . Drug use: [name]  . Sexual activity: Yes  Lifestyle  . Physical activity    Days per week: Not on file    Minutes per session: Not on file  . Stress: Not on file  Relationships  . Social Herbalist on phone: Not on file    Gets together: Not on file    Attends religious service: Not on file    Active member of club or organization: Not on file    Attends meetings of clubs or organizations: Not on file    Relationship status: Not on file  Other Topics Concern  . Not on file  Social History Narrative   Lives with wife.  4 kids total (2 step, 2 biological). 1 that has CP. Daughter [name]- comes to [facility]).        Retired Chief of Staff.  Oldest  daughter has CP      Hobbies: gardening, lawn care, care for grandkids   [name] Known Allergies Family History  Problem Relation Age of Onset  . Hypertension Father   . Dementia Father        Vascular. patient states dementia/possible alzheimers as well  . Heart disease Mother        died in 79s  . Hyperlipidemia Sister   . Cancer Brother        type unknown  . Cancer Brother        type unknown  . Heart disease Maternal Grandfather 62       Died probably of heart diseasse  . Heart disease Maternal Uncle 39       Died of "heart exploding"  . Cerebral palsy Daughter   . Stroke Son        severe problems with blood clotting     Current Outpatient Medications (Cardiovascular):  .  atorvastatin (LIPITOR) 80 MG tablet, TAKE 1 TABLET DAILY .  isosorbide  mononitrate (IMDUR) 60 MG 24 hr tablet, TAKE 1 TABLET DAILY .  nitroGLYCERIN (NITROSTAT) 0.4 MG SL tablet, DISSOLVE 1 TABLET UNDER THE TONGUE EVERY 5 MINUTES AS NEEDED FOR CHEST PAIN UP TO 3 DOSES (Patient taking differently: Place 0.4 mg under the tongue every 5 (five) minutes as needed for chest pain. )   Current Outpatient Medications (Analgesics):  .  aspirin 81 MG tablet, Take 81 mg by mouth daily.  Current Outpatient Medications (Hematological):  [name]  BRILINTA 90 MG TABS tablet, TAKE 1 TABLET TWICE A DAY (Patient taking differently: Take 90 mg by mouth 2 (two) times daily. )  Current Outpatient Medications (Other):  [name]  CHERRY PO, Take 50 mg by mouth daily. [place] extract .  Cinnamon 500 MG capsule, Take 500 mg by mouth 2 (two) times daily.  .  Prasterone, DHEA, (DHEA 50 PO), Take 1 capsule by mouth daily. Take for 4 weeks, then off for 2 weeks .  tiZANidine (ZANAFLEX) 4 MG tablet, Take 4 mg by mouth at bedtime.  .  Turmeric 500 MG CAPS, Take 500 mg by mouth daily.  .  Vitamin D, Ergocalciferol, (DRISDOL) 1.25 MG (50000 UT) CAPS capsule, TAKE 1 CAPSULE EVERY 7 DAYS (Patient taking differently: Take 50,000 Units by mouth every Sunday. )    Past medical history, social, surgical and family history all reviewed in electronic medical record.  [name] pertanent information unless stated regarding to the chief complaint.   Review of Systems:  [name] headache, visual changes, nausea, vomiting, diarrhea, constipation, dizziness, abdominal pain, skin rash, fevers, chills, night sweats, weight loss, swollen lymph nodes, body aches, joint swelling, muscle aches, chest pain, shortness of breath, mood changes.   Objective  Blood pressure 108/62, pulse 73, height 5\' 7"  (1.702 m), weight 198 lb (89.8 kg), SpO2 98 %.    General: [name] apparent distress alert and oriented x3 mood and affect normal, dressed appropriately.  HEENT: Pupils equal, extraocular movements intact  Respiratory: Patient's speak in full  sentences and does not appear short of breath  Cardiovascular: [name] lower extremity edema, non tender, [name] erythema  Skin: Warm dry intact with [name] signs of infection or rash on extremities or on axial skeleton.  Vitiligo noted Abdomen: Soft nontender  Neuro: Cranial nerves II through XII are intact, neurovascularly intact in all extremities with 2+ DTRs and 2+ pulses.  Lymph: [name] lymphadenopathy of posterior or anterior cervical chain or axillae bilaterally.  Gait normal with good  balance and coordination.  MSK:  Non tender with full range of motion and good stability and symmetric strength and tone of shoulders, elbows, wrist, hip, knee and ankles bilaterally.  Back exam has some loss of lordosis. Patient does have tenderness to palpation paraspinal musculature lumbar spine right greater than left.  Tightness with straight leg test but [name] radicular symptoms.  Osteopathic findings  T5 extended rotated and side bent left L2 flexed rotated and side bent right Sacrum right on right    Impression and Recommendations:     This case required medical decision making of moderate complexity. The above documentation has been reviewed and is accurate and complete Michael Pulley, DO       Note: This dictation was prepared with Dragon dictation along with smaller phrase technology. Any transcriptional errors that result from this process are unintentional.

## 2019-06-02 NOTE — Therapy (Signed)
[facility] [address] [place], [place], [address] Phone: [phone]   Fax:  [phone]  Physical Therapy Treatment  Patient Details  Name: Michael [name] MRN: [id number] Date of Birth: 12-12-1955 Referring Provider (PT): Charlann Boxer   Encounter Date: 06/02/2019  PT End of Session - 06/02/19 0859    Visit Number  3    Number of Visits  12    Date for PT Re-Evaluation  07/04/19    Authorization Type  [employer]    PT Start Time  [phone]    PT Stop Time  0930    PT Time Calculation (min)  38 min    Activity Tolerance  Patient tolerated treatment well    Behavior During Therapy  [facility] for tasks assessed/performed       Past Medical History:  Diagnosis Date  . CAD S/P PCI to Cx & RCA    a. s/p PCI of LCx (2006) and ostium of RCA (2007) as well as DES to [place]CA in [place] (05/2014)  . HTN (hypertension)   . Hyperlipidemia     Past Surgical History:  Procedure Laterality Date  . CARDIAC CATHETERIZATION N/A 05/06/2016   Procedure: Left Heart Cath and Coronary Angiography;  Surgeon: Nelva Bush, MD;  Location: [facility];  Service: Cardiovascular;  Laterality: N/A;  . CARDIAC CATHETERIZATION N/A 05/06/2016   Procedure: Coronary Stent Intervention;  Surgeon: Nelva Bush, MD;  Location: [facility];  Service: Cardiovascular;  Laterality: N/A;  . CARDIAC CATHETERIZATION N/A 05/13/2016   Procedure: Coronary/Graft Angiography;  Surgeon: Peter M Martinique, MD;  Location: [facility];  Service: Cardiovascular;  Laterality: N/A;  . CORONARY ANGIOPLASTY WITH STENT PLACEMENT  2006   in [place], occluded CFX, s/p Taxus stent  . CORONARY ANGIOPLASTY WITH STENT PLACEMENT  2007   in [place], ostial RCA stent  . CORONARY ANGIOPLASTY WITH STENT PLACEMENT  2015   in [place], RCA stent  . TONSILLECTOMY AND ADENOIDECTOMY       There were no vitals filed for this visit.  Subjective Assessment - 06/02/19 0858     Subjective  Pt states less pain.    Currently in Pain?  Yes    Pain Score  0-No pain    Pain Location  Back    Pain Onset  More than a month ago    Pain Frequency  Intermittent    Pain Score  3    Pain Location  Hip    Pain Orientation  Right    Pain Descriptors / Indicators  Aching    Pain Type  Chronic pain    Pain Onset  More than a month ago    Pain Frequency  Intermittent                       [facility] Adult PT Treatment/Exercise - 06/02/19 0857      Posture/Postural Control   Posture Comments  Standing: L shoulder lower, slightly retracted,  L hip slightly lower.   Supine: leg length appears even;       Exercises   Exercises  Lumbar      Lumbar Exercises: Stretches   Active Hamstring Stretch  2 reps;30 seconds    Active Hamstring Stretch Limitations  seated    Single Knee to Chest Stretch  30 seconds;Right;Left;2 reps    Standing Side Bend  --    Standing Side Bend Limitations  --    Piriformis Stretch  3 reps;30 seconds;Right;Left    Piriformis Stretch Limitations  seated     Other Lumbar Stretch Exercise  --    Other Lumbar Stretch Exercise  seated lumbar flexion 30 sec x2; Seated thoracic rotation x15;       Lumbar Exercises: Aerobic   Stationary Bike  L2 x 8 min      Lumbar Exercises: Standing   Other Standing Lumbar Exercises  Hip Abd, Ext and March x20 each bil;       Lumbar Exercises: Supine   Ab Set  --    Clam  --    Clam Limitations  --    Bent Knee Raise  --    Bent Knee Raise Limitations  --    Straight Leg Raise  10 reps    Straight Leg Raises Limitations  bil      Lumbar Exercises: Sidelying   Hip Abduction  20 reps;Both      Manual Therapy   Manual Therapy  Joint mobilization    Joint Mobilization  PA mobs for thoracic and lumbar spine, gr 3;  HIp inf mobs; Long leg distraction for R LE, lumbar pump x2 min;                PT Short Term Goals - 05/23/19 1203      PT SHORT TERM GOAL #1   Title  Pt to be  independent with initial HEP    Time  2    Period  Weeks    Status  New    Target Date  06/06/19      PT SHORT TERM GOAL #2   Title  Pt to demo improved pain in L thoracic region to 2/10    Time  2    Period  Weeks    Status  New    Target Date  06/06/19        PT Long Term Goals - 05/23/19 1204      PT LONG TERM GOAL #1   Title  Pt to demo improved ROM for lumbar spine to be pain free in all motions.    Time  6    Period  Weeks    Status  New    Target Date  07/04/19      PT LONG TERM GOAL #2   Title  Pt to demo improved pain in L thoracic and R glute region, to be 0-2/10 with activity    Time  6    Period  Weeks    Status  New    Target Date  07/04/19      PT LONG TERM GOAL #3   Title  Pt to demo increased strength and stability of core to be WNL, with minimal/no postural changes with stability exercises, to improve safety with functional activity    Time  6    Period  Weeks    Target Date  07/04/19      PT LONG TERM GOAL #4   Title  Pt to be independent with final HEP for back pain.    Time  6    Period  Weeks    Target Date  07/04/19            Plan - 06/02/19 1211    Clinical Impression Statement  Pt with soreness with palpation of R glute, will benefit from DN next session. Progressed strengthening for hips today, pt with good ability for glute contraction. Recommended pt increase frequency of stretching and  HEP at home.    Personal Factors and Comorbidities  Time since onset of injury/illness/exacerbation    Examination-Activity Limitations  Locomotion Level;Bend;Sit    Examination-Participation Restrictions  Community Activity;Yard Work    Stability/Clinical Decision Making  Stable/Uncomplicated    Rehab Potential  Good    PT Frequency  2x / week    PT Duration  6 weeks    PT Treatment/Interventions  ADLs/Self Care Home Management;Electrical Stimulation;Cryotherapy;DME Instruction;Ultrasound;Traction;Moist Heat;Iontophoresis 4mg /ml  Dexamethasone;Gait training;Stair training;Functional mobility training;Therapeutic activities;Therapeutic exercise;Neuromuscular re-education;Patient/family education;Manual techniques;Dry needling;Passive range of motion;Taping;Spinal Manipulations;Joint Manipulations    Consulted and Agree with Plan of Care  Patient       Patient will benefit from skilled therapeutic intervention in order to improve the following deficits and impairments:  Abnormal gait, Decreased range of motion, Increased muscle spasms, Decreased endurance, Decreased activity tolerance, Pain, Improper body mechanics, Impaired flexibility, Decreased strength  Visit Diagnosis: Chronic bilateral low back pain without sciatica  Pain in right hip     Problem List Patient Active Problem List   Diagnosis Date Noted  . Angina at rest [facility]) 12/16/2018  . History of adenomatous polyp of colon 06/07/2018  . BPH (benign prostatic hyperplasia) 06/07/2018  . Vitiligo 06/07/2018  . Gluteal pain 04/10/2018  . Plantar fasciitis 10/12/2017  . Back pain 04/13/2017  . Nonallopathic lesion of thoracic region 04/13/2017  . Nonallopathic lesion of sacral region 04/13/2017  . Nonallopathic lesion of lumbosacral region 04/13/2017  . Impaired glucose tolerance 11/03/2016  . Obesity   . HTN (hypertension)   . CAD S/P multiple PCIs 09/08/2014  . Dyslipidemia 09/08/2014    Lyndee Hensen, PT, DPT 12:13 PM  06/02/19    [facility] [place], [place], [address] Phone: [phone]   Fax:  [phone]  Name: Michael [name] MRN: [id number] Date of Birth: 10/11/1955

## 2019-06-03 ENCOUNTER — Other Ambulatory Visit: Payer: Self-pay

## 2019-06-03 ENCOUNTER — Ambulatory Visit (INDEPENDENT_AMBULATORY_CARE_PROVIDER_SITE_OTHER): Admitting: Family Medicine

## 2019-06-03 ENCOUNTER — Encounter: Payer: Self-pay | Admitting: Family Medicine

## 2019-06-03 VITALS — BP 108/62 | HR 73 | Ht 67.0 in | Wt 198.0 lb

## 2019-06-03 DIAGNOSIS — M999 Biomechanical lesion, unspecified: Secondary | ICD-10-CM | POA: Diagnosis not present

## 2019-06-03 DIAGNOSIS — M7918 Myalgia, other site: Secondary | ICD-10-CM | POA: Diagnosis not present

## 2019-06-03 NOTE — Assessment & Plan Note (Signed)
Gluteal pain that seems more secondary to the back pain.  Does respond fairly well to osteopathic manipulation.  Discussed posture and ergonomics.  Discussed which activities of doing which wants to avoid.  Increase activity slowly.  Follow-up again in 4 to 8 weeks

## 2019-06-03 NOTE — Patient Instructions (Signed)
Keep moving along Finish up with PT  See me in 7-8 weeks

## 2019-06-03 NOTE — Assessment & Plan Note (Signed)
Decision today to treat with OMT was based on Physical Exam  After verbal consent patient was treated with HVLA, ME, FPR techniques in  thoracic, lumbar and sacral areas  Patient tolerated the procedure well with improvement in symptoms  Patient given exercises, stretches and lifestyle modifications  See medications in patient instructions if given  Patient will follow up in 4-8 weeks 

## 2019-06-07 ENCOUNTER — Other Ambulatory Visit: Payer: Self-pay

## 2019-06-07 ENCOUNTER — Encounter: Payer: Self-pay | Admitting: Physical Therapy

## 2019-06-07 ENCOUNTER — Ambulatory Visit: Admitting: Physical Therapy

## 2019-06-07 DIAGNOSIS — G8929 Other chronic pain: Secondary | ICD-10-CM

## 2019-06-07 DIAGNOSIS — M25551 Pain in right hip: Secondary | ICD-10-CM | POA: Diagnosis not present

## 2019-06-07 DIAGNOSIS — M545 Low back pain, unspecified: Secondary | ICD-10-CM

## 2019-06-07 NOTE — Therapy (Signed)
[facility] [address] [place], [place], [address] Phone: [phone]   Fax:  [phone]  Physical Therapy Treatment  Patient Details  Name: Michael [name] MRN: [id number] Date of Birth: 1956/09/16 Referring Provider (PT): Charlann Boxer   Encounter Date: 06/07/2019  PT End of Session - 06/07/19 0857    Visit Number  4    Number of Visits  12    Date for PT Re-Evaluation  07/04/19    Authorization Type  [employer]    PT Start Time  0851    PT Stop Time  0938    PT Time Calculation (min)  47 min    Activity Tolerance  Patient tolerated treatment well    Behavior During Therapy  [facility] for tasks assessed/performed       Past Medical History:  Diagnosis Date  . CAD S/P PCI to Cx & RCA    a. s/p PCI of LCx (2006) and ostium of RCA (2007) as well as DES to RCA in [place] (05/2014)  . HTN (hypertension)   . Hyperlipidemia     Past Surgical History:  Procedure Laterality Date  . CARDIAC CATHETERIZATION N/A 05/06/2016   Procedure: Left Heart Cath and Coronary Angiography;  Surgeon: Nelva Bush, MD;  Location: [place] [facility];  Service: Cardiovascular;  Laterality: N/A;  . CARDIAC CATHETERIZATION N/A 05/06/2016   Procedure: Coronary Stent Intervention;  Surgeon: Nelva Bush, MD;  Location: [place] [facility];  Service: Cardiovascular;  Laterality: N/A;  . CARDIAC CATHETERIZATION N/A 05/13/2016   Procedure: Coronary/Graft Angiography;  Surgeon: Peter M Martinique, MD;  Location: [place] [facility];  Service: Cardiovascular;  Laterality: N/A;  . CORONARY ANGIOPLASTY WITH STENT PLACEMENT  2006   in [place], occluded CFX, s/p Taxus stent  . CORONARY ANGIOPLASTY WITH STENT PLACEMENT  2007   in [place], ostial RCA stent  . CORONARY ANGIOPLASTY WITH STENT PLACEMENT  2015   in [place], RCA stent  . TONSILLECTOMY AND ADENOIDECTOMY       There were no vitals filed for this visit.  Subjective Assessment - 06/07/19 0855     Subjective  Pt states decreasing pain in upper back. Has not been doing much walking lately, but states decreasing pain in hip/glute. Does feel tight today. Has been doing HEP more.    Limitations  Sitting;House hold activities    Currently in Pain?  Yes    Pain Score  2     Pain Location  Hip    Pain Orientation  Right    Pain Descriptors / Indicators  Tightness;Aching    Pain Type  Chronic pain    Pain Onset  More than a month ago    Pain Frequency  Intermittent                       [facility] Adult PT Treatment/Exercise - 06/07/19 0858      Posture/Postural Control   Posture Comments  Standing: L shoulder lower, slightly retracted,  L hip slightly lower.   Supine: leg length appears even;       Exercises   Exercises  Lumbar      Lumbar Exercises: Stretches   Active Hamstring Stretch  --    Active Hamstring Stretch Limitations  --    Single Knee to Chest Stretch  --    Piriformis Stretch  3 reps;30 seconds;Right;Left    Piriformis Stretch Limitations  supine    Other Lumbar Stretch Exercise  hip  flexor st, kneeling x2 min    Other Lumbar Stretch Exercise  --      Lumbar Exercises: Aerobic   Stationary Bike  L2 x 8 min      Lumbar Exercises: Standing   Other Standing Lumbar Exercises  Hip Abd, YTB x20 bil;       Lumbar Exercises: Supine   Clam  20 reps    Clam Limitations  GTB with TA    Bridge  20 reps    Straight Leg Raise  --    Straight Leg Raises Limitations  --      Lumbar Exercises: Sidelying   Hip Abduction  --      Manual Therapy   Manual Therapy  Joint mobilization    Manual therapy comments  skilled palpation and monitoring of soft tissue during dry needling     Joint Mobilization   HIp post and inf mobs; Long leg distraction for R LE, hip pump x2 min;        Trigger Point Dry Needling - 06/07/19 0001    Consent Given?  Yes    Education Handout Provided  Yes    Muscles Treated Back/Hip  Gluteus medius;Gluteus  maximus;Piriformis;Obturator internus    Gluteus Medius Response  Twitch response elicited;Palpable increased muscle length    Gluteus Maximus Response  Palpable increased muscle length    Piriformis Response  Palpable increased muscle length    Obturator internus Response  Palpable increased muscle length             PT Short Term Goals - 05/23/19 1203      PT SHORT TERM GOAL #1   Title  Pt to be independent with initial HEP    Time  2    Period  Weeks    Status  New    Target Date  06/06/19      PT SHORT TERM GOAL #2   Title  Pt to demo improved pain in L thoracic region to 2/10    Time  2    Period  Weeks    Status  New    Target Date  06/06/19        PT Long Term Goals - 05/23/19 1204      PT LONG TERM GOAL #1   Title  Pt to demo improved ROM for lumbar spine to be pain free in all motions.    Time  6    Period  Weeks    Status  New    Target Date  07/04/19      PT LONG TERM GOAL #2   Title  Pt to demo improved pain in L thoracic and R glute region, to be 0-2/10 with activity    Time  6    Period  Weeks    Status  New    Target Date  07/04/19      PT LONG TERM GOAL #3   Title  Pt to demo increased strength and stability of core to be WNL, with minimal/no postural changes with stability exercises, to improve safety with functional activity    Time  6    Period  Weeks    Target Date  07/04/19      PT LONG TERM GOAL #4   Title  Pt to be independent with final HEP for back pain.    Time  6    Period  Weeks    Target Date  07/04/19  Plan - 06/07/19 1211    Clinical Impression Statement  Pt with good response to dry needling today in glutes, will assess effects next visit, may also benefit from doing more DN in lumbar region. Pt progressing well, plan to progress strength and continue pain relief techniques.    Personal Factors and Comorbidities  Time since onset of injury/illness/exacerbation    Examination-Activity Limitations   Locomotion Level;Bend;Sit    Examination-Participation Restrictions  Community Activity;Yard Work    Stability/Clinical Decision Making  Stable/Uncomplicated    Rehab Potential  Good    PT Frequency  2x / week    PT Duration  6 weeks    PT Treatment/Interventions  ADLs/Self Care Home Management;Electrical Stimulation;Cryotherapy;DME Instruction;Ultrasound;Traction;Moist Heat;Iontophoresis 4mg /ml Dexamethasone;Gait training;Stair training;Functional mobility training;Therapeutic activities;Therapeutic exercise;Neuromuscular re-education;Patient/family education;Manual techniques;Dry needling;Passive range of motion;Taping;Spinal Manipulations;Joint Manipulations    Consulted and Agree with Plan of Care  Patient       Patient will benefit from skilled therapeutic intervention in order to improve the following deficits and impairments:  Abnormal gait, Decreased range of motion, Increased muscle spasms, Decreased endurance, Decreased activity tolerance, Pain, Improper body mechanics, Impaired flexibility, Decreased strength  Visit Diagnosis: Chronic bilateral low back pain without sciatica  Pain in right hip     Problem List Patient Active Problem List   Diagnosis Date Noted  . Angina at rest [facility]) 12/16/2018  . History of adenomatous polyp of colon 06/07/2018  . BPH (benign prostatic hyperplasia) 06/07/2018  . Vitiligo 06/07/2018  . Gluteal pain 04/10/2018  . Plantar fasciitis 10/12/2017  . Back pain 04/13/2017  . Nonallopathic lesion of thoracic region 04/13/2017  . Nonallopathic lesion of sacral region 04/13/2017  . Nonallopathic lesion of lumbosacral region 04/13/2017  . Impaired glucose tolerance 11/03/2016  . Obesity   . HTN (hypertension)   . CAD S/P multiple PCIs 09/08/2014  . Dyslipidemia 09/08/2014    Lyndee Hensen, PT, DPT 12:12 PM  06/07/19    [facility] [place], [place], [address] Phone: [phone]    Fax:  [phone]  Name: Raheel Milovich MRN: [id number] Date of Birth: Nov 09, 1955

## 2019-06-09 ENCOUNTER — Other Ambulatory Visit: Payer: Self-pay

## 2019-06-09 ENCOUNTER — Encounter: Payer: Self-pay | Admitting: Physical Therapy

## 2019-06-09 ENCOUNTER — Ambulatory Visit: Admitting: Physical Therapy

## 2019-06-09 DIAGNOSIS — G8929 Other chronic pain: Secondary | ICD-10-CM | POA: Diagnosis not present

## 2019-06-09 DIAGNOSIS — M25551 Pain in right hip: Secondary | ICD-10-CM | POA: Diagnosis not present

## 2019-06-09 DIAGNOSIS — M545 Low back pain, unspecified: Secondary | ICD-10-CM

## 2019-06-09 NOTE — Therapy (Signed)
[facility] [address] [place], [place], [address] Phone: [phone]   Fax:  [phone]  Physical Therapy Treatment  Patient Details  Name: Michael [name] MRN: [id number] Date of Birth: 23-Jun-1956 Referring Provider (PT): Charlann Boxer   Encounter Date: 06/09/2019  PT End of Session - 06/09/19 0857    Visit Number  5    Number of Visits  12    Date for PT Re-Evaluation  07/04/19    Authorization Type  [employer]    PT Start Time  0848    PT Stop Time  0928    PT Time Calculation (min)  40 min    Activity Tolerance  Patient tolerated treatment well    Behavior During Therapy  [facility] for tasks assessed/performed       Past Medical History:  Diagnosis Date  . CAD S/P PCI to Cx & RCA    a. s/p PCI of LCx (2006) and ostium of RCA (2007) as well as DES to RCA in [place] (05/2014)  . HTN (hypertension)   . Hyperlipidemia     Past Surgical History:  Procedure Laterality Date  . CARDIAC CATHETERIZATION N/A 05/06/2016   Procedure: Left Heart Cath and Coronary Angiography;  Surgeon: Nelva Bush, MD;  Location: [facility];  Service: Cardiovascular;  Laterality: N/A;  . CARDIAC CATHETERIZATION N/A 05/06/2016   Procedure: Coronary Stent Intervention;  Surgeon: Nelva Bush, MD;  Location: [facility];  Service: Cardiovascular;  Laterality: N/A;  . CARDIAC CATHETERIZATION N/A 05/13/2016   Procedure: Coronary/Graft Angiography;  Surgeon: Peter M Martinique, MD;  Location: [facility];  Service: Cardiovascular;  Laterality: N/A;  . CORONARY ANGIOPLASTY WITH STENT PLACEMENT  2006   in [place], occluded CFX, s/p Taxus stent  . CORONARY ANGIOPLASTY WITH STENT PLACEMENT  2007   in [place], ostial RCA stent  . CORONARY ANGIOPLASTY WITH STENT PLACEMENT  2015   in [place], RCA stent  . TONSILLECTOMY AND ADENOIDECTOMY       There were no vitals filed for this visit.  Subjective Assessment - 06/09/19 0855     Subjective  Pt states significant pain relief from DN last visit. He has been doing HEP    Currently in Pain?  No/denies    Pain Score  0-No pain                       [facility] Adult PT Treatment/Exercise - 06/09/19 0853      Posture/Postural Control   Posture Comments  Standing: L shoulder lower, slightly retracted,  L hip slightly lower.   Supine: leg length appears even;       Exercises   Exercises  Lumbar      Lumbar Exercises: Stretches   Single Knee to Chest Stretch  30 seconds;Right;Left;2 reps    Lower Trunk Rotation  10 seconds;5 reps    Piriformis Stretch  --    Piriformis Stretch Limitations  --    Other Lumbar Stretch Exercise  --      Lumbar Exercises: Aerobic   Stationary Bike  L2 x 8 min      Lumbar Exercises: Standing   Functional Squats  20 reps    Other Standing Lumbar Exercises  Hip Abd, YTB x20 bil; Hip ext 2x10 bil;       Lumbar Exercises: Supine   Clam  20 reps    Clam Limitations  GTB with TA    Bridge  --  Bridge with clamshell  20 reps    Bridge with [employer] Limitations  GTB    Straight Leg Raise  20 reps      Lumbar Exercises: Sidelying   Clam  20 reps;Both      Manual Therapy   Manual Therapy  Joint mobilization    Manual therapy comments  --    Joint Mobilization  PA mobs for thoracic and lumbar spine , long leg distraction on R for lumbar pump               PT Short Term Goals - 05/23/19 1203      PT SHORT TERM GOAL #1   Title  Pt to be independent with initial HEP    Time  2    Period  Weeks    Status  New    Target Date  06/06/19      PT SHORT TERM GOAL #2   Title  Pt to demo improved pain in L thoracic region to 2/10    Time  2    Period  Weeks    Status  New    Target Date  06/06/19        PT Long Term Goals - 05/23/19 1204      PT LONG TERM GOAL #1   Title  Pt to demo improved ROM for lumbar spine to be pain free in all motions.    Time  6    Period  Weeks    Status  New    Target  Date  07/04/19      PT LONG TERM GOAL #2   Title  Pt to demo improved pain in L thoracic and R glute region, to be 0-2/10 with activity    Time  6    Period  Weeks    Status  New    Target Date  07/04/19      PT LONG TERM GOAL #3   Title  Pt to demo increased strength and stability of core to be WNL, with minimal/no postural changes with stability exercises, to improve safety with functional activity    Time  6    Period  Weeks    Target Date  07/04/19      PT LONG TERM GOAL #4   Title  Pt to be independent with final HEP for back pain.    Time  6    Period  Weeks    Target Date  07/04/19            Plan - 06/09/19 0931    Clinical Impression Statement  Pt progressing very well, much decreased pain in glute region. Improving ability for strengthening, improved postural awareness with activity. Pt to be seen for 1-2 more weeks, plan to work towards d/c.    Personal Factors and Comorbidities  Time since onset of injury/illness/exacerbation    Examination-Activity Limitations  Locomotion Level;Bend;Sit    Examination-Participation Restrictions  Community Activity;Yard Work    Stability/Clinical Decision Making  Stable/Uncomplicated    Rehab Potential  Good    PT Frequency  2x / week    PT Duration  6 weeks    PT Treatment/Interventions  ADLs/Self Care Home Management;Electrical Stimulation;Cryotherapy;DME Instruction;Ultrasound;Traction;Moist Heat;Iontophoresis 4mg /ml Dexamethasone;Gait training;Stair training;Functional mobility training;Therapeutic activities;Therapeutic exercise;Neuromuscular re-education;Patient/family education;Manual techniques;Dry needling;Passive range of motion;Taping;Spinal Manipulations;Joint Manipulations    Consulted and Agree with Plan of Care  Patient       Patient will benefit from skilled therapeutic intervention in order to  improve the following deficits and impairments:  Abnormal gait, Decreased range of motion, Increased muscle spasms,  Decreased endurance, Decreased activity tolerance, Pain, Improper body mechanics, Impaired flexibility, Decreased strength  Visit Diagnosis: Chronic bilateral low back pain without sciatica  Pain in right hip     Problem List Patient Active Problem List   Diagnosis Date Noted  . Angina at rest [facility]) 12/16/2018  . History of adenomatous polyp of colon 06/07/2018  . BPH (benign prostatic hyperplasia) 06/07/2018  . Vitiligo 06/07/2018  . Gluteal pain 04/10/2018  . Plantar fasciitis 10/12/2017  . Back pain 04/13/2017  . Nonallopathic lesion of thoracic region 04/13/2017  . Nonallopathic lesion of sacral region 04/13/2017  . Nonallopathic lesion of lumbosacral region 04/13/2017  . Impaired glucose tolerance 11/03/2016  . Obesity   . HTN (hypertension)   . CAD S/P multiple PCIs 09/08/2014  . Dyslipidemia 09/08/2014     Lyndee Hensen, PT, DPT 9:32 AM  06/09/19   [facility] [place], [place], [address] Phone: [phone]   Fax:  [phone]  Name: Michael [name] MRN: [id number] Date of Birth: 24-Sep-1955

## 2019-06-14 ENCOUNTER — Encounter: Payer: Self-pay | Admitting: Physical Therapy

## 2019-06-14 ENCOUNTER — Ambulatory Visit: Admitting: Physical Therapy

## 2019-06-14 ENCOUNTER — Other Ambulatory Visit: Payer: Self-pay

## 2019-06-14 DIAGNOSIS — G8929 Other chronic pain: Secondary | ICD-10-CM

## 2019-06-14 DIAGNOSIS — M545 Low back pain, unspecified: Secondary | ICD-10-CM

## 2019-06-14 NOTE — Therapy (Signed)
[facility] [address] [place], [place], [address] Phone: [phone]   Fax:  [phone]  Physical Therapy Treatment  Patient Details  Name: Michael [name] MRN: [id number] Date of Birth: [birth year] Referring Provider (PT): Charlann Boxer   Encounter Date: 06/14/2019  PT End of Session - 06/14/19 0849    Visit Number  6    Number of Visits  12    Date for PT Re-Evaluation  07/04/19    Authorization Type  [employer]    PT Start Time  0846    PT Stop Time  0927    PT Time Calculation (min)  41 min    Activity Tolerance  Patient tolerated treatment well    Behavior During Therapy  [facility] for tasks assessed/performed       Past Medical History:  Diagnosis Date  . CAD S/P PCI to Cx & RCA    a. s/p PCI of LCx (2006) and ostium of RCA (2007) as well as DES to [place]CA in [place] (05/2014)  . HTN (hypertension)   . Hyperlipidemia     Past Surgical History:  Procedure Laterality Date  . CARDIAC CATHETERIZATION N/A 05/06/2016   Procedure: Left Heart Cath and Coronary Angiography;  Surgeon: Nelva Bush, MD;  Location: [facility] [facility];  Service: Cardiovascular;  Laterality: N/A;  . CARDIAC CATHETERIZATION N/A 05/06/2016   Procedure: Coronary Stent Intervention;  Surgeon: Nelva Bush, MD;  Location: [facility];  Service: Cardiovascular;  Laterality: N/A;  . CARDIAC CATHETERIZATION N/A 05/13/2016   Procedure: Coronary/Graft Angiography;  Surgeon: Peter M Martinique, MD;  Location: [facility];  Service: Cardiovascular;  Laterality: N/A;  . CORONARY ANGIOPLASTY WITH STENT PLACEMENT  2006   in [place], occluded CFX, s/p Taxus stent  . CORONARY ANGIOPLASTY WITH STENT PLACEMENT  2007   in [place], ostial RCA stent  . CORONARY ANGIOPLASTY WITH STENT PLACEMENT  2015   in [place], RCA stent  . TONSILLECTOMY AND ADENOIDECTOMY       There were no vitals filed for this visit.  Subjective Assessment - 06/14/19 0849     Subjective  Patient reports some soreness in his low back; lower than previously experienced. He mowed the lawn a lot lately and also has a new mattress.    Patient Stated Goals  decreased pain    Currently in Pain?  Yes    Pain Score  1     Pain Location  Back    Pain Orientation  Lower    Pain Descriptors / Indicators  Aching                       [facility] Adult PT Treatment/Exercise - 06/14/19 0001      Lumbar Exercises: Stretches   Single Knee to Chest Stretch  30 seconds;Right;Left;2 reps    Lower Trunk Rotation  10 seconds;5 reps      Lumbar Exercises: Aerobic   Stationary Bike  L2 x 8 min      Lumbar Exercises: Standing   Functional Squats  20 reps    Other Standing Lumbar Exercises  Hip Abd, GTB x20 bil; Hip ext 2x10 bil;       Lumbar Exercises: Supine   Clam  20 reps    Clam Limitations  GTB with TA    Bridge with clamshell  20 reps    Bridge with Ball Squeeze Limitations  GTB      Lumbar Exercises: Sidelying  Clam  20 reps;Both    Clam Limitations  GTB      Lumbar Exercises: Prone   Single Arm Raise  20 reps    Straight Leg Raise  20 reps    Opposite Arm/Leg Raise  Right arm/Left leg;Left arm/Right leg;10 reps             PT Education - 06/14/19 0932    Education Details  HEP updated    Person(s) Educated  Patient    Methods  Explanation;Demonstration;Handout    Comprehension  Verbalized understanding;Returned demonstration       PT Short Term Goals - 06/14/19 1043      PT SHORT TERM GOAL #1   Title  Pt to be independent with initial HEP    Time  2    Period  Weeks    Status  Achieved      PT SHORT TERM GOAL #2   Title  Pt to demo improved pain in L thoracic region to 2/10    Time  2    Period  Weeks    Status  Achieved        PT Long Term Goals - 05/23/19 1204      PT LONG TERM GOAL #1   Title  Pt to demo improved ROM for lumbar spine to be pain free in all motions.    Time  6    Period  Weeks    Status  New     Target Date  07/04/19      PT LONG TERM GOAL #2   Title  Pt to demo improved pain in L thoracic and R glute region, to be 0-2/10 with activity    Time  6    Period  Weeks    Status  New    Target Date  07/04/19      PT LONG TERM GOAL #3   Title  Pt to demo increased strength and stability of core to be WNL, with minimal/no postural changes with stability exercises, to improve safety with functional activity    Time  6    Period  Weeks    Target Date  07/04/19      PT LONG TERM GOAL #4   Title  Pt to be independent with final HEP for back pain.    Time  6    Period  Weeks    Target Date  07/04/19            Plan - 06/14/19 [id number]    Clinical Impression Statement  Patient tolerated TE well. No c/o of pain at end of treatment.    PT Treatment/Interventions  ADLs/Self Care Home Management;Electrical Stimulation;Cryotherapy;DME Instruction;Ultrasound;Traction;Moist Heat;Iontophoresis 4mg /ml Dexamethasone;Gait training;Stair training;Functional mobility training;Therapeutic activities;Therapeutic exercise;Neuromuscular re-education;Patient/family education;Manual techniques;Dry needling;Passive range of motion;Taping;Spinal Manipulations;Joint Manipulations    PT Next Visit Plan  continue strengthening       Patient will benefit from skilled therapeutic intervention in order to improve the following deficits and impairments:  Abnormal gait, Decreased range of motion, Increased muscle spasms, Decreased endurance, Decreased activity tolerance, Pain, Improper body mechanics, Impaired flexibility, Decreased strength  Visit Diagnosis: Chronic bilateral low back pain without sciatica     Problem List Patient Active Problem List   Diagnosis Date Noted  . Angina at rest [facility]) 12/16/2018  . History of adenomatous polyp of colon 06/07/2018  . BPH (benign prostatic hyperplasia) 06/07/2018  . Vitiligo 06/07/2018  . Gluteal pain 04/10/2018  . Plantar fasciitis 10/12/2017  . Back  pain  04/13/2017  . Nonallopathic lesion of thoracic region 04/13/2017  . Nonallopathic lesion of sacral region 04/13/2017  . Nonallopathic lesion of lumbosacral region 04/13/2017  . Impaired glucose tolerance 11/03/2016  . Obesity   . HTN (hypertension)   . CAD S/P multiple PCIs 09/08/2014  . Dyslipidemia 09/08/2014    Madelyn Flavors PT 06/14/2019, 10:44 AM  [facility] [address] [place], [place], [address] Phone: [phone]   Fax:  [phone]  Name: Michael [name] MRN: [id number] Date of Birth: 06-03-1956

## 2019-06-14 NOTE — Patient Instructions (Signed)
Access Code: [email or web address]  URL: [email or web address]  Date: 06/14/2019  Prepared by: Almyra Free Latia Mataya   Exercises  Single Knee to Chest Stretch - 3 reps - 30 hold - 2x daily  Supine Figure 4 Piriformis Stretch - 3 reps - 30 hold - 2x daily  Seated Piriformis Stretch with Trunk Bend - 3 reps - 30 hold - 2x daily  Standing Sidebending with Chair Support - 3 reps - 30 hold - 2x daily  Left Standing Lateral Shift Correction at Wall - Repetitions - 10 reps - 2 sets - 2x daily  Supine March - 10 reps - 2 sets - 1x daily  Hooklying Clamshell with Resistance - 10 reps - 2 sets - 1x daily  Sidelying Hip Abduction - 10 reps - 2 sets - 1x daily  Seated Trunk Rotation - Arms Crossed - 10 reps - 1-2 sets - 1-2x daily  Doorway Piriformis Stretch - 3 reps - 1 sets - 60 sec hold - 2x daily - 7x weekly  Seated Piriformis Stretch - 3 reps - 1 sets - 30-60 sec hold - 3x daily - 7x weekly  Supine Quadratus Lumborum Stretch - 3 reps - 1 sets - 60 sec hold - 2x daily - 7x weekly

## 2019-06-16 ENCOUNTER — Other Ambulatory Visit: Payer: Self-pay

## 2019-06-16 ENCOUNTER — Ambulatory Visit: Admitting: Physical Therapy

## 2019-06-16 ENCOUNTER — Encounter: Payer: Self-pay | Admitting: Physical Therapy

## 2019-06-16 DIAGNOSIS — M545 Low back pain, unspecified: Secondary | ICD-10-CM

## 2019-06-16 DIAGNOSIS — M25551 Pain in right hip: Secondary | ICD-10-CM | POA: Diagnosis not present

## 2019-06-16 DIAGNOSIS — G8929 Other chronic pain: Secondary | ICD-10-CM

## 2019-06-16 NOTE — Therapy (Signed)
[facility] [address] [place], [place], [address] Phone: [phone]   Fax:  [phone]  Physical Therapy Treatment  Patient Details  Name: Michael [name] MRN: [id number] Date of Birth: 1956/02/22 Referring Provider (PT): Charlann Boxer   Encounter Date: 06/16/2019  PT End of Session - 06/16/19 1156    Visit Number  7    Number of Visits  12    Date for PT Re-Evaluation  07/04/19    Authorization Type  [employer]    PT Start Time  [phone]    PT Stop Time  0930    PT Time Calculation (min)  44 min    Activity Tolerance  Patient tolerated treatment well    Behavior During Therapy  [facility] for tasks assessed/performed       Past Medical History:  Diagnosis Date  . CAD S/P PCI to Cx & RCA    a. s/p PCI of LCx (2006) and ostium of RCA (2007) as well as DES to [place] in [place] (05/2014)  . HTN (hypertension)   . Hyperlipidemia     Past Surgical History:  Procedure Laterality Date  . CARDIAC CATHETERIZATION N/A 05/06/2016   Procedure: Left Heart Cath and Coronary Angiography;  Surgeon: Nelva Bush, MD;  Location: [facility];  Service: Cardiovascular;  Laterality: N/A;  . CARDIAC CATHETERIZATION N/A 05/06/2016   Procedure: Coronary Stent Intervention;  Surgeon: Nelva Bush, MD;  Location: [facility];  Service: Cardiovascular;  Laterality: N/A;  . CARDIAC CATHETERIZATION N/A 05/13/2016   Procedure: Coronary/Graft Angiography;  Surgeon: Peter M Martinique, MD;  Location: [place] [facility];  Service: Cardiovascular;  Laterality: N/A;  . CORONARY ANGIOPLASTY WITH STENT PLACEMENT  2006   in [place], occluded CFX, s/p Taxus stent  . CORONARY ANGIOPLASTY WITH STENT PLACEMENT  2007   in [place], ostial RCA stent  . CORONARY ANGIOPLASTY WITH STENT PLACEMENT  2015   in [place], RCA stent  . TONSILLECTOMY AND ADENOIDECTOMY       There were no vitals filed for this visit.  Subjective Assessment - 06/16/19 1155     Subjective  Pt states that he has been mostly pain free, feels pain in hip/glute is much improved. States some"stiffness" at times.    Currently in Pain?  No/denies    Pain Score  0-No pain         [facility] PT Assessment - 06/16/19 0001      AROM   Overall AROM Comments  Lumbar: mild soreness with R SB                   [facility] Adult PT Treatment/Exercise - 06/16/19 0851      Lumbar Exercises: Stretches   Single Knee to Chest Stretch  30 seconds;Right;Left;2 reps    Lower Trunk Rotation  10 seconds;5 reps    Piriformis Stretch  3 reps;30 seconds;Right;Left    Piriformis Stretch Limitations  seated      Lumbar Exercises: Aerobic   Stationary Bike  L3 x 8 min      Lumbar Exercises: Standing   Functional Squats  20 reps    Other Standing Lumbar Exercises  Hip Abd, YTB x20 bil;     Other Standing Lumbar Exercises  Walk/March 10 ft x6;       Lumbar Exercises: Supine   Clam  20 reps    Clam Limitations  GTB with TA    Bridge with clamshell  20 reps  Bridge with [employer] Limitations  GTB    Other Supine Lumbar Exercises  modified crunch x20;     Other Supine Lumbar Exercises  90/90 heel taps x10;       Lumbar Exercises: Sidelying   Clam  --    Clam Limitations  --      Lumbar Exercises: Prone   Single Arm Raise  --    Straight Leg Raise  20 reps    Opposite Arm/Leg Raise  Right arm/Left leg;Left arm/Right leg;10 reps      Lumbar Exercises: Quadruped   Other Quadruped Lumbar Exercises  bird dog x20             PT Education - 06/16/19 1156    Education Details  HEP updated    Person(s) Educated  Patient    Methods  Explanation;Demonstration;Tactile cues;Verbal cues;Handout    Comprehension  Verbalized understanding;Returned demonstration;Verbal cues required       PT Short Term Goals - 06/14/19 1043      PT SHORT TERM GOAL #1   Title  Pt to be independent with initial HEP    Time  2    Period  Weeks    Status  Achieved      PT SHORT TERM  GOAL #2   Title  Pt to demo improved pain in L thoracic region to 2/10    Time  2    Period  Weeks    Status  Achieved        PT Long Term Goals - 05/23/19 1204      PT LONG TERM GOAL #1   Title  Pt to demo improved ROM for lumbar spine to be pain free in all motions.    Time  6    Period  Weeks    Status  New    Target Date  07/04/19      PT LONG TERM GOAL #2   Title  Pt to demo improved pain in L thoracic and R glute region, to be 0-2/10 with activity    Time  6    Period  Weeks    Status  New    Target Date  07/04/19      PT LONG TERM GOAL #3   Title  Pt to demo increased strength and stability of core to be WNL, with minimal/no postural changes with stability exercises, to improve safety with functional activity    Time  6    Period  Weeks    Target Date  07/04/19      PT LONG TERM GOAL #4   Title  Pt to be independent with final HEP for back pain.    Time  6    Period  Weeks    Target Date  07/04/19            Plan - 06/16/19 1158    Clinical Impression Statement  Pt with good ability to progress strengthening. Requires verbal cues for TA contraction at times. Much improved standing posture/alignment and postural awareness, decreased lumbar SB to L today in standing. He has mild pain with R SB, which has been his baseline, but no other pain with motion or activity. Pt to be seen for 1 more visit, to finalize HEP,  then will d/c to HEP.    PT Treatment/Interventions  ADLs/Self Care Home Management;Electrical Stimulation;Cryotherapy;DME Instruction;Ultrasound;Traction;Moist Heat;Iontophoresis 4mg /ml Dexamethasone;Gait training;Stair training;Functional mobility training;Therapeutic activities;Therapeutic exercise;Neuromuscular re-education;Patient/family education;Manual techniques;Dry needling;Passive range of motion;Taping;Spinal Manipulations;Joint Manipulations  PT Next Visit Plan  continue strengthening       Patient will benefit from skilled therapeutic  intervention in order to improve the following deficits and impairments:  Abnormal gait, Decreased range of motion, Increased muscle spasms, Decreased endurance, Decreased activity tolerance, Pain, Improper body mechanics, Impaired flexibility, Decreased strength  Visit Diagnosis: Chronic bilateral low back pain without sciatica  Pain in right hip     Problem List Patient Active Problem List   Diagnosis Date Noted  . Angina at rest [facility]) 12/16/2018  . History of adenomatous polyp of colon 06/07/2018  . BPH (benign prostatic hyperplasia) 06/07/2018  . Vitiligo 06/07/2018  . Gluteal pain 04/10/2018  . Plantar fasciitis 10/12/2017  . Back pain 04/13/2017  . Nonallopathic lesion of thoracic region 04/13/2017  . Nonallopathic lesion of sacral region 04/13/2017  . Nonallopathic lesion of lumbosacral region 04/13/2017  . Impaired glucose tolerance 11/03/2016  . Obesity   . HTN (hypertension)   . CAD S/P multiple PCIs 09/08/2014  . Dyslipidemia 09/08/2014    Lyndee Hensen, PT, DPT 12:03 PM  06/16/19    [facility] [place], [place], [address] Phone: [phone]   Fax:  [phone]  Name: Michael [name] MRN: [id number] Date of Birth: 1956/03/30

## 2019-06-16 NOTE — Patient Instructions (Signed)
Access Code: [email or web address]  URL: [email or web address]  Date: 06/16/2019  Prepared by: Lyndee Hensen   Exercises Single Knee to Chest Stretch - 3 reps - 30 hold - 2x daily Supine Lower Trunk Rotation - 10 reps - 10 hold - 2x daily Supine Figure 4 Piriformis Stretch - 3 reps - 30 hold - 2x daily Seated Piriformis Stretch with Trunk Bend - 3 reps - 30 hold - 2x daily Standing Sidebending with Chair Support - 3 reps - 30 hold - 2x daily Left Standing Lateral Shift Correction at [place] - 10 reps - 2 sets - 2x daily Hooklying Clamshell with Resistance - 10 reps - 2 sets - 1x daily Sidelying Hip Abduction - 10 reps - 2 sets - 1x daily Supine Bridge - 10 reps - 2 sets - 1x daily Standing Repeated Hip Extension with Resistance - 10 reps - 2 sets - 1x daily Standing Repeated Hip Abduction with Resistance - 10 reps - 2 sets - 1x daily Curl Up with Reach - 10 reps - 2 sets - 1x daily Supine March - 10 reps - 2 sets - 1x daily Supine 90/90 Alternating Toe Touch - 10 reps - 2 sets - 1x daily Bird Dog - 10 reps - 2 sets - 1x daily Prone Alternating Arm and Leg Lifts - 10 reps - 2 sets - 1x daily

## 2019-06-21 ENCOUNTER — Ambulatory Visit: Admitting: Physical Therapy

## 2019-06-21 ENCOUNTER — Other Ambulatory Visit: Payer: Self-pay

## 2019-06-21 ENCOUNTER — Encounter: Payer: Self-pay | Admitting: Physical Therapy

## 2019-06-21 DIAGNOSIS — G8929 Other chronic pain: Secondary | ICD-10-CM | POA: Diagnosis not present

## 2019-06-21 DIAGNOSIS — M545 Low back pain, unspecified: Secondary | ICD-10-CM

## 2019-06-21 DIAGNOSIS — M25551 Pain in right hip: Secondary | ICD-10-CM | POA: Diagnosis not present

## 2019-06-21 NOTE — Patient Instructions (Signed)
Access Code: [email or web address]  URL: [email or web address]  Date: 06/21/2019  Prepared by: Lyndee Hensen   Exercises Single Knee to Chest Stretch - 3 reps - 30 hold - 2x daily Supine Lower Trunk Rotation - 10 reps - 10 hold - 2x daily Supine Figure 4 Piriformis Stretch - 3 reps - 30 hold - 2x daily Seated Piriformis Stretch with Trunk Bend - 3 reps - 30 hold - 2x daily Standing Sidebending with Chair Support - 3 reps - 30 hold - 2x daily Left Standing Lateral Shift Correction at [place] - 10 reps - 2 sets - 2x daily Hooklying Clamshell with Resistance - 10 reps - 2 sets - 1x daily Sidelying Hip Abduction - 10 reps - 2 sets - 1x daily Supine Bridge - 10 reps - 2 sets - 1x daily Standing Repeated Hip Extension with Resistance - 10 reps - 2 sets - 1x daily Standing Repeated Hip Abduction with Resistance - 10 reps - 2 sets - 1x daily Curl Up with Reach - 10 reps - 2 sets - 1x daily Supine March - 10 reps - 2 sets - 1x daily Supine 90/90 Alternating Toe Touch - 10 reps - 2 sets - 1x daily Bird Dog - 10 reps - 2 sets - 1x daily Prone Alternating Arm and Leg Lifts - 10 reps - 2 sets - 1x daily

## 2019-06-21 NOTE — Therapy (Signed)
[facility] [address] [place], [place], [address] Phone: [phone]   Fax:  [phone]  Physical Therapy Treatment/Discharge  Patient Details  Name: Michael [name] MRN: [id number] Date of Birth: Apr 05, 1956 Referring Provider (PT): Charlann Boxer   Encounter Date: 06/21/2019  PT End of Session - 06/21/19 1305    Visit Number  8    Number of Visits  12    Date for PT Re-Evaluation  07/04/19    Authorization Type  [employer]    PT Start Time  6606    PT Stop Time  1335    PT Time Calculation (min)  39 min    Activity Tolerance  Patient tolerated treatment well    Behavior During Therapy  [facility] for tasks assessed/performed       Past Medical History:  Diagnosis Date  . CAD S/P PCI to Cx & RCA    a. s/p PCI of LCx (2006) and ostium of RCA (2007) as well as DES to RCA in [place] (05/2014)  . HTN (hypertension)   . Hyperlipidemia     Past Surgical History:  Procedure Laterality Date  . CARDIAC CATHETERIZATION N/A 05/06/2016   Procedure: Left Heart Cath and Coronary Angiography;  Surgeon: Nelva Bush, MD;  Location: [facility];  Service: Cardiovascular;  Laterality: N/A;  . CARDIAC CATHETERIZATION N/A 05/06/2016   Procedure: Coronary Stent Intervention;  Surgeon: Nelva Bush, MD;  Location: [facility];  Service: Cardiovascular;  Laterality: N/A;  . CARDIAC CATHETERIZATION N/A 05/13/2016   Procedure: Coronary/Graft Angiography;  Surgeon: Peter M Martinique, MD;  Location: [facility];  Service: Cardiovascular;  Laterality: N/A;  . CORONARY ANGIOPLASTY WITH STENT PLACEMENT  2006   in [place], occluded CFX, s/p Taxus stent  . CORONARY ANGIOPLASTY WITH STENT PLACEMENT  2007   in [place], ostial RCA stent  . CORONARY ANGIOPLASTY WITH STENT PLACEMENT  2015   in [place], RCA stent  . TONSILLECTOMY AND ADENOIDECTOMY       There were no vitals filed for this visit.  Subjective Assessment -  06/21/19 1303    Subjective  Pt thinks he is doing very well, no pain in last week or so. He did "agressive walking" over the weekend, had a little "stiffness" that went away after stretches.    Currently in Pain?  No/denies    Pain Score  0-No pain         [facility] PT Assessment - 06/21/19 0001      AROM   Overall AROM Comments  WFL, mild soreness with R SB      Strength   Overall Strength Comments  LEs 5/5 , core: WNL                    [facility] Adult PT Treatment/Exercise - 06/21/19 1311      Lumbar Exercises: Stretches   Single Knee to Chest Stretch  30 seconds;Right;Left;2 reps    Lower Trunk Rotation  10 seconds;5 reps    Piriformis Stretch  --    Piriformis Stretch Limitations  --      Lumbar Exercises: Aerobic   Stationary Bike  L3 x 8 min      Lumbar Exercises: Standing   Functional Squats  20 reps    Functional Squats Limitations  10 lb     Other Standing Lumbar Exercises  Hip Abd and Ext , YTB x20 bil;     Other Standing Lumbar Exercises  Walk/March  10 ft x6;       Lumbar Exercises: Supine   Clam  20 reps    Clam Limitations  GTB with TA    Bent Knee Raise  20 reps    Bridge with Ball Squeeze  20 reps    Straight Leg Raise  20 reps    Other Supine Lumbar Exercises  modified crunch x20;     Other Supine Lumbar Exercises  90/90 heel taps x10;       Lumbar Exercises: Prone   Straight Leg Raise  20 reps    Opposite Arm/Leg Raise  Right arm/Left leg;Left arm/Right leg;10 reps      Lumbar Exercises: Quadruped   Other Quadruped Lumbar Exercises  bird dog x20               PT Short Term Goals - 06/21/19 1305      PT SHORT TERM GOAL #1   Title  Pt to be independent with initial HEP    Time  2    Period  Weeks    Status  Achieved      PT SHORT TERM GOAL #2   Title  Pt to demo improved pain in L thoracic region to 2/10    Time  2    Period  Weeks    Status  Achieved        PT Long Term Goals - 06/21/19 1305      PT LONG TERM GOAL #1    Title  Pt to demo improved ROM for lumbar spine to be pain free in all motions.    Baseline  Mild soreness w R SB intermittently    Time  6    Period  Weeks    Status  Partially Met      PT LONG TERM GOAL #2   Title  Pt to demo improved pain in L thoracic and R glute region, to be 0-2/10 with activity    Time  6    Period  Weeks    Status  Achieved      PT LONG TERM GOAL #3   Title  Pt to demo increased strength and stability of core to be WNL, with minimal/no postural changes with stability exercises, to improve safety with functional activity    Time  6    Period  Weeks    Status  Achieved      PT LONG TERM GOAL #4   Title  Pt to be independent with final HEP for back pain.    Time  6    Period  Weeks    Status  Achieved            Plan - 06/21/19 1330    Clinical Impression Statement  Pt has made good progress. He has much improved pain and mobility. Pt has met all goals. Ready for d/c to HEP, final HEp reviweed today, pt with good understanding.    PT Treatment/Interventions  ADLs/Self Care Home Management;Electrical Stimulation;Cryotherapy;DME Instruction;Ultrasound;Traction;Moist Heat;Iontophoresis 37m/ml Dexamethasone;Gait training;Stair training;Functional mobility training;Therapeutic activities;Therapeutic exercise;Neuromuscular re-education;Patient/family education;Manual techniques;Dry needling;Passive range of motion;Taping;Spinal Manipulations;Joint Manipulations    PT Next Visit Plan  continue strengthening       Patient will benefit from skilled therapeutic intervention in order to improve the following deficits and impairments:  Abnormal gait, Decreased range of motion, Increased muscle spasms, Decreased endurance, Decreased activity tolerance, Pain, Improper body mechanics, Impaired flexibility, Decreased strength  Visit Diagnosis: Chronic bilateral low back pain without sciatica  Pain in right hip     Problem List Patient Active Problem List    Diagnosis Date Noted  . Angina at rest [facility]) 12/16/2018  . History of adenomatous polyp of colon 06/07/2018  . BPH (benign prostatic hyperplasia) 06/07/2018  . Vitiligo 06/07/2018  . Gluteal pain 04/10/2018  . Plantar fasciitis 10/12/2017  . Back pain 04/13/2017  . Nonallopathic lesion of thoracic region 04/13/2017  . Nonallopathic lesion of sacral region 04/13/2017  . Nonallopathic lesion of lumbosacral region 04/13/2017  . Impaired glucose tolerance 11/03/2016  . Obesity   . HTN (hypertension)   . CAD S/P multiple PCIs 09/08/2014  . Dyslipidemia 09/08/2014    Lyndee Hensen, PT, DPT 1:39 PM  06/21/19    [facility] [place], [place], [address] Phone: [phone]   Fax:  [phone]  Name: Michael [name] MRN: [id number] Date of Birth: Sep 08, 1956  PHYSICAL THERAPY DISCHARGE SUMMARY Visits: 8  Plan: Patient agrees to discharge.  Patient goals were met. Patient is being discharged due to meeting the stated rehab goals.  ?????      Lyndee Hensen, PT, DPT 1:39 PM  06/21/19

## 2019-07-25 ENCOUNTER — Encounter: Payer: Self-pay | Admitting: Family Medicine

## 2019-07-25 ENCOUNTER — Other Ambulatory Visit: Payer: Self-pay

## 2019-07-25 ENCOUNTER — Ambulatory Visit (INDEPENDENT_AMBULATORY_CARE_PROVIDER_SITE_OTHER): Admitting: Family Medicine

## 2019-07-25 VITALS — BP 110/70 | HR 84 | Ht 67.0 in | Wt 197.0 lb

## 2019-07-25 DIAGNOSIS — G8929 Other chronic pain: Secondary | ICD-10-CM | POA: Diagnosis not present

## 2019-07-25 DIAGNOSIS — M549 Dorsalgia, unspecified: Secondary | ICD-10-CM | POA: Diagnosis not present

## 2019-07-25 DIAGNOSIS — M999 Biomechanical lesion, unspecified: Secondary | ICD-10-CM

## 2019-07-25 NOTE — Assessment & Plan Note (Signed)
Multifactorial.  Patient has been doing relatively well overall.  Actually has been quite sometime since we seen him.  Encouraged him to continue home exercises and follow-up again in 3 months time.

## 2019-07-25 NOTE — Assessment & Plan Note (Signed)
Decision today to treat with OMT was based on Physical Exam  After verbal consent patient was treated with HVLA, ME, FPR techniques in  thoracic, lumbar and sacral areas  Patient tolerated the procedure well with improvement in symptoms  Patient given exercises, stretches and lifestyle modifications  See medications in patient instructions if given  Patient will follow up in 4-8 weeks 

## 2019-07-25 NOTE — Progress Notes (Signed)
[name] [place], [place] [address] Phone: [phone] Subjective:   I Michael [name] am serving as a Education administrator for Dr. Hulan Saas.  I'm seeing this patient by the request  of:    CC: Low back pain follow-up  [id number]  Michael [name] is a 63 y.o. male coming in with complaint of back pain. Patient has been going to PT and states he is doing better. Left sided lower back pain has decreased. Right hip is doing better. Patient states that overall doing significantly better.  States that he is 80% himself.  Not having any significant pain with increasing activity at the moment.  Patient has not taken any oral anti-inflammatories.    Past Medical History:  Diagnosis Date  . CAD S/P PCI to Cx & RCA    a. s/p PCI of LCx (2006) and ostium of RCA (2007) as well as DES to RCA in [place] (05/2014)  . HTN (hypertension)   . Hyperlipidemia    Past Surgical History:  Procedure Laterality Date  . CARDIAC CATHETERIZATION N/A 05/06/2016   Procedure: Left Heart Cath and Coronary Angiography;  Surgeon: Nelva Bush, MD;  Location: [facility];  Service: Cardiovascular;  Laterality: N/A;  . CARDIAC CATHETERIZATION N/A 05/06/2016   Procedure: Coronary Stent Intervention;  Surgeon: Nelva Bush, MD;  Location: [facility];  Service: Cardiovascular;  Laterality: N/A;  . CARDIAC CATHETERIZATION N/A 05/13/2016   Procedure: Coronary/Graft Angiography;  Surgeon: Peter M Martinique, MD;  Location: [facility];  Service: Cardiovascular;  Laterality: N/A;  . CORONARY ANGIOPLASTY WITH STENT PLACEMENT  2006   in [place], occluded CFX, s/p Taxus stent  . CORONARY ANGIOPLASTY WITH STENT PLACEMENT  2007   in [place], ostial RCA stent  . CORONARY ANGIOPLASTY WITH STENT PLACEMENT  2015   in [place], RCA stent  . TONSILLECTOMY AND ADENOIDECTOMY      Social History   Socioeconomic History  . Marital status: Married   Spouse name: Not on file  . Number of children: 4  . Years of education: Not on file  . Highest education level: Not on file  Occupational History  . Occupation: retired  Scientific laboratory technician  . Financial resource strain: Not on file  . Food insecurity    Worry: Not on file    Inability: Not on file  . Transportation needs    Medical: Not on file    Non-medical: Not on file  Tobacco Use  . Smoking status: Never Smoker  . Smokeless tobacco: Never Used  Substance and Sexual Activity  . Alcohol use: No    Alcohol/week: 0.0 standard drinks  . Drug use: No  . Sexual activity: Yes  Lifestyle  . Physical activity    Days per week: Not on file    Minutes per session: Not on file  . Stress: Not on file  Relationships  . Social Herbalist on phone: Not on file    Gets together: Not on file    Attends religious service: Not on file    Active member of club or organization: Not on file    Attends meetings of clubs or organizations: Not on file    Relationship status: Not on file  Other Topics Concern  . Not on file  Social History Narrative   Lives with wife.  4 kids total (2 step, 2 biological). 1 that has CP. Daughter Michael [name]- comes to [facility]).  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 Hypertension Father   . Dementia Father        Vascular. patient states dementia/possible alzheimers as well  . Heart disease Mother        died in 8s  . Hyperlipidemia Sister   . Cancer Brother        type unknown  . Cancer Brother        type unknown  . Heart disease Maternal Grandfather 32       Died probably of heart diseasse  . Heart disease Maternal Uncle 32       Died of "heart exploding"  . Cerebral palsy Daughter   . Stroke Son        severe problems with blood clotting     Current Outpatient Medications  (Cardiovascular):  .  atorvastatin (LIPITOR) 80 MG tablet, TAKE 1 TABLET DAILY .  isosorbide mononitrate (IMDUR) 60 MG 24 hr tablet, TAKE 1 TABLET DAILY .  nitroGLYCERIN (NITROSTAT) 0.4 MG SL tablet, DISSOLVE 1 TABLET UNDER THE TONGUE EVERY 5 MINUTES AS NEEDED FOR CHEST PAIN UP TO 3 DOSES (Patient taking differently: Place 0.4 mg under the tongue every 5 (five) minutes as needed for chest pain. )   Current Outpatient Medications (Analgesics):  .  aspirin 81 MG tablet, Take 81 mg by mouth daily.  Current Outpatient Medications (Hematological):  [name]  BRILINTA 90 MG TABS tablet, TAKE 1 TABLET TWICE A DAY (Patient taking differently: Take 90 mg by mouth 2 (two) times daily. )  Current Outpatient Medications (Other):  [name]  CHERRY PO, Take 50 mg by mouth daily. [place] extract .  Cinnamon 500 MG capsule, Take 500 mg by mouth 2 (two) times daily.  .  Prasterone, DHEA, (DHEA 50 PO), Take 1 capsule by mouth daily. Take for 4 weeks, then off for 2 weeks .  tiZANidine (ZANAFLEX) 4 MG tablet, Take 4 mg by mouth at bedtime.  .  Turmeric 500 MG CAPS, Take 500 mg by mouth daily.  .  Vitamin D, Ergocalciferol, (DRISDOL) 1.25 MG (50000 UT) CAPS capsule, TAKE 1 CAPSULE EVERY 7 DAYS (Patient taking differently: Take 50,000 Units by mouth every Sunday. )    Past medical history, social, surgical and family history all reviewed in electronic medical record.  No pertanent information unless stated regarding to the chief complaint.   Review of Systems:  No headache, visual changes, nausea, vomiting, diarrhea, constipation, dizziness, abdominal pain, skin rash, fevers, chills, night sweats, weight loss, swollen lymph nodes, body aches, joint swelling,  chest pain, shortness of breath, mood changes.  Positive muscle aches  Objective  Blood pressure 110/70, pulse 84, height 5\' 7"  (1.702 m), weight 197 lb (89.4 kg), SpO2 97 %.    General: No apparent distress alert and oriented x3 mood and affect normal, dressed  appropriately.  HEENT: Pupils equal, extraocular movements intact  Respiratory: Patient's speak in full sentences and does not appear short of breath  Cardiovascular: No lower extremity edema, non tender, no erythema  Skin: Warm dry intact with no signs of infection or rash on extremities or on axial skeleton.  Abdomen: Soft nontender  Neuro: Cranial nerves II through XII are intact, neurovascularly intact in all extremities with 2+ DTRs and 2+ pulses.  Lymph: No lymphadenopathy of posterior or anterior cervical chain or axillae  bilaterally.  Gait normal with good balance and coordination.  MSK:  tender with full range of motion and good stability and symmetric strength and tone of shoulders, elbows, wrist, hip, knee and ankles bilaterally.   Back exam has some loss of lordosis.  Some tenderness to palpation in the paraspinal musculature lumbar spine right greater than left.  Patient has pain over the right sacroiliac joint.  Mild positive Faber, negative straight leg test.  Near full range of motion of the low back  Osteopathic findings T4 extended rotated and side bent left T7 extended rotated and side bent right L2 flexed rotated and side bent right Sacrum right on right     Impression and Recommendations:     This case required medical decision making of moderate complexity. The above documentation has been reviewed and is accurate and complete Michael Pulley, DO       Note: This dictation was prepared with Dragon dictation along with smaller phrase technology. Any transcriptional errors that result from this process are unintentional.

## 2019-07-25 NOTE — Patient Instructions (Signed)
Doing great See me in 3 months

## 2019-08-05 ENCOUNTER — Encounter (HOSPITAL_COMMUNITY): Payer: Self-pay | Admitting: Emergency Medicine

## 2019-08-05 ENCOUNTER — Emergency Department (HOSPITAL_COMMUNITY)
Admission: EM | Admit: 2019-08-05 | Discharge: 2019-08-05 | Disposition: A | Discharge status: Home / Self Care | Attending: Emergency Medicine | Admitting: Emergency Medicine

## 2019-08-05 ENCOUNTER — Emergency Department (HOSPITAL_COMMUNITY)

## 2019-08-05 DIAGNOSIS — I2510 Atherosclerotic heart disease of native coronary artery without angina pectoris: Secondary | ICD-10-CM | POA: Diagnosis not present

## 2019-08-05 DIAGNOSIS — I10 Essential (primary) hypertension: Secondary | ICD-10-CM | POA: Insufficient documentation

## 2019-08-05 DIAGNOSIS — R0789 Other chest pain: Secondary | ICD-10-CM | POA: Insufficient documentation

## 2019-08-05 DIAGNOSIS — Z79899 Other long term (current) drug therapy: Secondary | ICD-10-CM | POA: Diagnosis not present

## 2019-08-05 DIAGNOSIS — R079 Chest pain, unspecified: Secondary | ICD-10-CM

## 2019-08-05 DIAGNOSIS — Z7982 Long term (current) use of aspirin: Secondary | ICD-10-CM | POA: Insufficient documentation

## 2019-08-05 LAB — CBC
HCT: 41.4 % (ref 39.0–52.0)
Hemoglobin: 14.2 g/dL (ref 13.0–17.0)
MCH: 32.6 pg (ref 26.0–34.0)
MCHC: 34.3 g/dL (ref 30.0–36.0)
MCV: 95 fL (ref 80.0–100.0)
Platelets: 246 10*3/uL (ref 150–400)
RBC: 4.36 MIL/uL (ref 4.22–5.81)
RDW: 12.8 % (ref 11.5–15.5)
WBC: 8.4 10*3/uL (ref 4.0–10.5)
nRBC: 0 % (ref 0.0–0.2)

## 2019-08-05 LAB — BASIC METABOLIC PANEL
Anion gap: 10 (ref 5–15)
BUN: 19 mg/dL (ref 8–23)
CO2: 26 mmol/L (ref 22–32)
Calcium: 9.3 mg/dL (ref 8.9–10.3)
Chloride: 102 mmol/L (ref 98–111)
Creatinine, Ser: 1.01 mg/dL (ref 0.61–1.24)
GFR calc Af Amer: >60 mL/min (ref >60)
GFR calc non Af Amer: >60 mL/min (ref >60)
Glucose, Bld: 104 mg/dL — ABNORMAL HIGH (ref 70–99)
Potassium: 3.9 mmol/L (ref 3.5–5.1)
Sodium: 138 mmol/L (ref 135–145)

## 2019-08-05 LAB — TROPONIN I (HIGH SENSITIVITY)
Troponin I (High Sensitivity): 6 ng/L (ref <18)
Troponin I (High Sensitivity): 5 ng/L (ref <18)

## 2019-08-05 MED ORDER — SODIUM CHLORIDE 0.9% FLUSH: 3.0000 mL | Freq: Once | INTRAVENOUS | Status: DC

## 2019-08-05 MED ORDER — ASPIRIN 81 MG PO CHEW
324.0000 mg | CHEWABLE_TABLET | Freq: Once | ORAL | Status: AC
  Administered 2019-08-05: 324 mg via ORAL
  Filled 2019-08-05: qty 4

## 2019-08-05 MED ORDER — CYCLOBENZAPRINE HCL 10 MG PO TABS
10.0000 mg | ORAL_TABLET | Freq: Once | ORAL | Status: AC
  Administered 2019-08-05: 10 mg via ORAL
  Filled 2019-08-05: qty 1

## 2019-08-05 MED ORDER — NITROGLYCERIN 0.4 MG SL SUBL
0.4000 mg | SUBLINGUAL_TABLET | SUBLINGUAL | Status: DC | PRN
  Administered 2019-08-05: 0.4 mg via SUBLINGUAL
  Filled 2019-08-05: qty 1

## 2019-08-05 MED ORDER — CYCLOBENZAPRINE HCL 10 MG PO TABS: 10.0000 mg | ORAL_TABLET | Freq: Two times a day (BID) | ORAL | 0 refills | Status: DC | PRN

## 2019-08-05 NOTE — ED Triage Notes (Signed)
Pt states he is here for acute onset of central chest pain that he thought was a muscle pain. Started today. Took nitro and tylenol around 2pm. Pain currently 2/10. No shortness of breath or dizziness. Pt states he feels better.

## 2019-08-05 NOTE — ED Provider Notes (Signed)
[facility] EMERGENCY DEPARTMENT Provider Note   CSN: [id number] Arrival date & time: 08/05/19  1437     History   Chief Complaint No chief complaint on file.   HPI Michael [name] is a 63 y.o. male.     HPI  Began this AM, initially felt like muscular pain has had before, then as morning went on it got worse, burning, aching pain left upper chest towards clavicle and left shoulder, a little sensation ulnar side of hand Noon took nitro, took another at 1, seemed to help both times After being in ER, seemed pain came back more than earlier, severe pain now, maybe 7/10 after nitro here, was about the same before here, at home pain was less severe but was helped by nitro Worse it was when sitting in ED, not necessarily worse with exertion.   Also painful to move shoulder, had to reach out to get XR and was painful, but doesn't remember doing anything to cause damage to joints or muscles.  No neck pain. Sore to touch.   Feels different than discomfort had prior to catheterization  2017 last cath with Dr. Saunders Revel  , 2015, 2006 and 2007 Admitted 11/2018 with CP, had myoview showing intermediate risk  No dyspnea, nausea, sweating, (reports has some dyspnea with sitting up at baseline, not having it now)  No cough, fever, leg pain or swelling  No hx of DVT/PE/long trips car/airplane, recent surgeries  No smoking, no etoh or other drugs  Family hx of CAD grandfather, uncle died at 59 of heart disease   Past Medical History:  Diagnosis Date  . CAD S/P PCI to Cx & RCA    a. s/p PCI of LCx (2006) and ostium of RCA (2007) as well as DES to RCA in [place] (05/2014)  . HTN (hypertension)   . Hyperlipidemia     Patient Active Problem List   Diagnosis Date Noted  . Angina at rest [facility]) 12/16/2018  . History of adenomatous polyp of colon 06/07/2018  . BPH (benign prostatic hyperplasia) 06/07/2018  . Vitiligo 06/07/2018  . Gluteal pain 04/10/2018  . Plantar  fasciitis 10/12/2017  . Back pain 04/13/2017  . Nonallopathic lesion of thoracic region 04/13/2017  . Nonallopathic lesion of sacral region 04/13/2017  . Nonallopathic lesion of lumbosacral region 04/13/2017  . Impaired glucose tolerance 11/03/2016  . Obesity   . HTN (hypertension)   . CAD S/P multiple PCIs 09/08/2014  . Dyslipidemia 09/08/2014    Past Surgical History:  Procedure Laterality Date  . CARDIAC CATHETERIZATION N/A 05/06/2016   Procedure: Left Heart Cath and Coronary Angiography;  Surgeon: Nelva Bush, MD;  Location: [facility];  Service: Cardiovascular;  Laterality: N/A;  . CARDIAC CATHETERIZATION N/A 05/06/2016   Procedure: Coronary Stent Intervention;  Surgeon: Nelva Bush, MD;  Location: [facility];  Service: Cardiovascular;  Laterality: N/A;  . CARDIAC CATHETERIZATION N/A 05/13/2016   Procedure: Coronary/Graft Angiography;  Surgeon: Peter M Martinique, MD;  Location: [facility];  Service: Cardiovascular;  Laterality: N/A;  . CORONARY ANGIOPLASTY WITH STENT PLACEMENT  2006   in [place], occluded CFX, s/p Taxus stent  . CORONARY ANGIOPLASTY WITH STENT PLACEMENT  2007   in [place], ostial RCA stent  . CORONARY ANGIOPLASTY WITH STENT PLACEMENT  2015   in [place], RCA stent  . TONSILLECTOMY AND ADENOIDECTOMY           Home Medications    Prior to Admission medications  Medication Sig Start Date End Date Taking? Authorizing Provider  aspirin 81 MG tablet Take 81 mg by mouth daily.    [provider]  atorvastatin (LIPITOR) 80 MG tablet TAKE 1 TABLET DAILY 01/10/19   Minus Breeding, MD  BRILINTA 90 MG TABS tablet TAKE 1 TABLET TWICE A DAY Patient taking differently: Take 90 mg by mouth 2 (two) times daily.  12/09/18   Minus Breeding, MD  CHERRY PO Take 50 mg by mouth daily. Cherry extract    [provider]  Cinnamon 500 MG capsule Take 500 mg by mouth 2 (two) times daily.     [provider]   cyclobenzaprine (FLEXERIL) 10 MG tablet Take 1 tablet (10 mg total) by mouth 2 (two) times daily as needed for muscle spasms. 08/05/19   Gareth Morgan, MD  isosorbide mononitrate (IMDUR) 60 MG 24 hr tablet TAKE 1 TABLET DAILY 01/10/19   Minus Breeding, MD  nitroGLYCERIN (NITROSTAT) 0.4 MG SL tablet DISSOLVE 1 TABLET UNDER THE TONGUE EVERY 5 MINUTES AS NEEDED FOR CHEST PAIN UP TO 3 DOSES Patient taking differently: Place 0.4 mg under the tongue every 5 (five) minutes as needed for chest pain.  06/28/18   Minus Breeding, MD  Prasterone, DHEA, (DHEA 50 PO) Take 1 capsule by mouth daily. Take for 4 weeks, then off for 2 weeks    [provider]  tiZANidine (ZANAFLEX) 4 MG tablet Take 4 mg by mouth at bedtime.     [provider]  Turmeric 500 MG CAPS Take 500 mg by mouth daily.     [provider]  Vitamin D, Ergocalciferol, (DRISDOL) 1.25 MG (50000 UT) CAPS capsule TAKE 1 CAPSULE EVERY 7 DAYS Patient taking differently: Take 50,000 Units by mouth every Sunday.  07/28/18   [name], DO    Family History Family History  Problem Relation Age of Onset  . Hypertension Father   . Dementia Father        Vascular. patient states dementia/possible alzheimers as well  . Heart disease Mother        died in 93s  . Hyperlipidemia Sister   . Cancer Brother        type unknown  . Cancer Brother        type unknown  . Heart disease Maternal Grandfather 67       Died probably of heart diseasse  . Heart disease Maternal Uncle 62       Died of "heart exploding"  . Cerebral palsy Daughter   . Stroke Son        severe problems with blood clotting    Social History Social History   Tobacco Use  . Smoking status: Never Smoker  . Smokeless tobacco: Never Used  Substance Use Topics  . Alcohol use: No    Alcohol/week: 0.0 standard drinks  . Drug use: No     Allergies   Patient has no known allergies.   Review of Systems Review of Systems  Constitutional:  Negative for fever.  Respiratory: Negative for cough and shortness of breath.   Cardiovascular: Positive for chest pain. Negative for leg swelling.  Gastrointestinal: Negative for abdominal pain, nausea and vomiting.  Genitourinary: Negative for difficulty urinating.  Musculoskeletal: Positive for arthralgias.  Skin: Negative for rash.  Neurological: Negative for light-headedness.     Physical Exam Updated Vital Signs BP (!) 159/82   Pulse 72   Temp 98.8 F (37.1 C)   Resp 17   Ht 5'  7" (1.702 m)   Wt 89.4 kg   SpO2 98%   BMI 30.85 kg/m   Physical Exam Vitals signs and nursing note reviewed.  Constitutional:      General: He is not in acute distress.    Appearance: He is well-developed. He is not diaphoretic.  HENT:     Head: Normocephalic and atraumatic.  Eyes:     Conjunctiva/sclera: Conjunctivae normal.  Neck:     Musculoskeletal: Normal range of motion.  Cardiovascular:     Rate and Rhythm: Normal rate and regular rhythm.     Heart sounds: Normal heart sounds. No murmur. No friction rub. No gallop.   Pulmonary:     Effort: Pulmonary effort is normal. No respiratory distress.     Breath sounds: Normal breath sounds. No wheezing or rales.  Chest:     Chest wall: Tenderness present.  Abdominal:     General: There is no distension.     Palpations: Abdomen is soft.     Tenderness: There is no abdominal tenderness. There is no guarding.  Musculoskeletal:     Left shoulder: Decreased range of motion: pain with ROM/abduction.  Skin:    General: Skin is warm and dry.  Neurological:     Mental Status: He is alert and oriented to person, place, and time.      ED Treatments / Results  Labs (all labs ordered are listed, but only abnormal results are displayed) Labs Reviewed  BASIC METABOLIC PANEL - Abnormal; Notable for the following components:      Result Value   Glucose, Bld 104 (*)    All other components within normal limits  CBC  TROPONIN I (HIGH  SENSITIVITY)  TROPONIN I (HIGH SENSITIVITY)    EKG EKG Interpretation  Date/Time:  Friday August 05 2019 16:55:20 EST Ventricular Rate:  65 PR Interval:  156 QRS Duration: 106 QT Interval:  417 QTC Calculation: 434 R Axis:   77 Text Interpretation: Sinus rhythm No significant change since last tracing Confirmed by Gareth Morgan [phone]) on 08/05/2019 5:24:40 PM   Radiology Dg Chest 2 View  Result Date: 08/05/2019 CLINICAL DATA:  Chest pain. EXAM: CHEST - 2 VIEW COMPARISON:  12/16/2018. FINDINGS: Mediastinum and hilar structures normal. Heart size normal. Mild right base subsegmental atelectasis. No pleural effusion or pneumothorax. Degenerative change thoracic spine. IMPRESSION: Mild right base subsegmental atelectasis. Electronically Signed   By: Marcello Moores  Register   On: 08/05/2019 15:24    Procedures Procedures (including critical care time)  Medications Ordered in ED Medications  aspirin chewable tablet 324 mg (324 mg Oral Given 08/05/19 1809)  cyclobenzaprine (FLEXERIL) tablet 10 mg (10 mg Oral Given 08/05/19 1810)     Initial Impression / Assessment and Plan / ED Course  I have reviewed the triage vital signs and the nursing notes.  Pertinent labs & imaging results that were available during my care of the patient were reviewed by me and considered in my medical decision making (see chart for details).        Michael [name] is a pleasant 63yo male with history of CAD, htn, hlpd who presents with left upper chest pain and shoulder. EKG shows no acute findings.  Good pulses bilateral upper and lower extremities, hx, exam, xr not consistent with aortic dissection. No dyspnea, hypoxia, asymmetric leg swelling and doubt PE.  No sign of pericarditis.  Delta troponins 6 and 5, no sign of ACS.  Given chest wall tenderness, significant pain with left shoulder movements, feel  MSK etiology more likely than anginal equivalent and that outpatient follow up with Cardiology is  appropriate given his past medical history.  Do not see signs of acute spinal pathology as etiology of symptoms.  No signs of septic arthritis.  Given rx for flexeril and recommend follow up with Cardiology. Patient discharged in stable condition with understanding of reasons to return.   Final Clinical Impressions(s) / ED Diagnoses   Final diagnoses:  Chest pain, unspecified type    ED Discharge Orders         Ordered    cyclobenzaprine (FLEXERIL) 10 MG tablet  2 times daily PRN     08/05/19 1854           Gareth Morgan, MD 08/06/19 1244

## 2019-08-05 NOTE — ED Notes (Signed)
ED Provider at bedside. 

## 2019-08-07 ENCOUNTER — Other Ambulatory Visit: Payer: Self-pay | Admitting: Cardiology

## 2019-09-13 ENCOUNTER — Other Ambulatory Visit: Payer: Self-pay

## 2019-09-14 ENCOUNTER — Ambulatory Visit (INDEPENDENT_AMBULATORY_CARE_PROVIDER_SITE_OTHER)

## 2019-09-14 ENCOUNTER — Encounter: Payer: Self-pay | Admitting: Family Medicine

## 2019-09-14 ENCOUNTER — Ambulatory Visit (INDEPENDENT_AMBULATORY_CARE_PROVIDER_SITE_OTHER): Admitting: Family Medicine

## 2019-09-14 VITALS — BP 110/70 | HR 69 | Temp 97.3°F | Ht 67.0 in | Wt 201.8 lb

## 2019-09-14 DIAGNOSIS — R0781 Pleurodynia: Secondary | ICD-10-CM

## 2019-09-14 DIAGNOSIS — E785 Hyperlipidemia, unspecified: Secondary | ICD-10-CM

## 2019-09-14 DIAGNOSIS — Z9861 Coronary angioplasty status: Secondary | ICD-10-CM

## 2019-09-14 DIAGNOSIS — Z23 Encounter for immunization: Secondary | ICD-10-CM

## 2019-09-14 DIAGNOSIS — R7302 Impaired glucose tolerance (oral): Secondary | ICD-10-CM

## 2019-09-14 DIAGNOSIS — R0789 Other chest pain: Secondary | ICD-10-CM

## 2019-09-14 DIAGNOSIS — I10 Essential (primary) hypertension: Secondary | ICD-10-CM | POA: Diagnosis not present

## 2019-09-14 DIAGNOSIS — I2510 Atherosclerotic heart disease of native coronary artery without angina pectoris: Secondary | ICD-10-CM

## 2019-09-14 LAB — COMPREHENSIVE METABOLIC PANEL
ALT: 25 U/L (ref 0–53)
AST: 24 U/L (ref 0–37)
Albumin: 4.1 g/dL (ref 3.5–5.2)
Alkaline Phosphatase: 79 U/L (ref 39–117)
BUN: 19 mg/dL (ref 6–23)
CO2: 27 mEq/L (ref 19–32)
Calcium: 9.2 mg/dL (ref 8.4–10.5)
Chloride: 103 mEq/L (ref 96–112)
Creatinine, Ser: 0.98 mg/dL (ref 0.40–1.50)
GFR: 76.88 mL/min (ref >60.00)
Glucose, Bld: 97 mg/dL (ref 70–99)
Potassium: 4.2 mEq/L (ref 3.5–5.1)
Sodium: 139 mEq/L (ref 135–145)
Total Bilirubin: 1.2 mg/dL (ref 0.2–1.2)
Total Protein: 6.3 g/dL (ref 6.0–8.3)

## 2019-09-14 LAB — LDL CHOLESTEROL, DIRECT: Direct LDL: 62 mg/dL

## 2019-09-14 LAB — HEMOGLOBIN A1C: Hgb A1c MFr Bld: 6 % (ref 4.6–6.5)

## 2019-09-14 MED ORDER — CYCLOBENZAPRINE HCL 10 MG PO TABS: 10.0000 mg | ORAL_TABLET | Freq: Two times a day (BID) | ORAL | 0 refills | Status: DC | PRN

## 2019-09-14 NOTE — Patient Instructions (Addendum)
Health Maintenance Due  Topic Date Due  . INFLUENZA VACCINE -today 04/23/2019   Please stop by lab before you go If you do not have mychart- we will call you about results within 5 business days of [place] receiving them.  If you have mychart- we will send your results within 3 business days of [place] receiving them.  If abnormal or we want to clarify a result, we will call or mychart you to make sure you receive the message.  If you have questions or concerns or don't hear within 5-7 days, please send [place] a message or call us.   Feels like muscle spasm medial to the scapula. We will trial muscle relaxant at least twice a day for 5 days. Also try heat 20 minutes 3x a day. Get rib films to make sure no bony abnormality and follow up on prior atelectasis on x-ray. If no better in 10-14 days- trial physical therapy for this specifically  Recommended follow up: Return in about 6 months (around 03/14/2020) for physical or sooner if needed. -You are already scheduled

## 2019-09-14 NOTE — Progress Notes (Signed)
Phone [phone] In person visit   Subjective:   Michael [name] is a 63 y.o. year old very pleasant male patient who presents for/with See problem oriented charting Chief Complaint  Patient presents with  . Follow-up  . Hypertension    ROS- right sided pain near scapula sometimes into right chest. No fever/chills/cough/congestion. Occasional mild sinus congestion   This visit occurred during the SARS-CoV-2 public health emergency.  Safety protocols were in place, including screening questions prior to the visit, additional usage of staff PPE, and extensive cleaning of exam room while observing appropriate contact time as indicated for disinfecting solutions.   Past Medical History-  Patient Active Problem List   Diagnosis Date Noted  . CAD S/P multiple PCIs 09/08/2014    Priority: High  . History of adenomatous polyp of colon 06/07/2018    Priority: Medium  . BPH (benign prostatic hyperplasia) 06/07/2018    Priority: Medium  . Impaired glucose tolerance 11/03/2016    Priority: Medium  . HTN (hypertension)     Priority: Medium  . Dyslipidemia 09/08/2014    Priority: Medium  . Vitiligo 06/07/2018    Priority: Low  . Gluteal pain 04/10/2018    Priority: Low  . Plantar fasciitis 10/12/2017    Priority: Low  . Back pain 04/13/2017    Priority: Low  . Nonallopathic lesion of thoracic region 04/13/2017    Priority: Low  . Nonallopathic lesion of sacral region 04/13/2017    Priority: Low  . Nonallopathic lesion of lumbosacral region 04/13/2017    Priority: Low  . Obesity     Priority: Low  . Angina at rest [facility]) 12/16/2018    Medications- reviewed and updated Current Outpatient Medications  Medication Sig Dispense Refill  . aspirin 81 MG tablet Take 81 mg by mouth daily.    [name] atorvastatin (LIPITOR) 80 MG tablet TAKE 1 TABLET DAILY 90 tablet 1  . BRILINTA 90 MG TABS tablet TAKE 1 TABLET TWICE A DAY (Patient taking differently: Take 90 mg by mouth 2 (two) times  daily. ) 180 tablet 3  . CHERRY PO Take 50 mg by mouth daily. [place] extract    . Cinnamon 500 MG capsule Take 500 mg by mouth 2 (two) times daily.     . cyclobenzaprine (FLEXERIL) 10 MG tablet Take 1 tablet (10 mg total) by mouth 2 (two) times daily as needed for muscle spasms. 20 tablet 0  . isosorbide mononitrate (IMDUR) 60 MG 24 hr tablet TAKE 1 TABLET DAILY 90 tablet 1  . nitroGLYCERIN (NITROSTAT) 0.4 MG SL tablet DISSOLVE 1 TABLET UNDER THE TONGUE EVERY 5 MINUTES AS NEEDED FOR CHEST PAIN UP TO 3 DOSES (Patient taking differently: Place 0.4 mg under the tongue every 5 (five) minutes as needed for chest pain. ) 1 tablet 12  . Prasterone, DHEA, (DHEA 50 PO) Take 1 capsule by mouth daily. Take for 4 weeks, then off for 2 weeks    . tiZANidine (ZANAFLEX) 4 MG tablet Take 4 mg by mouth at bedtime.     . Turmeric 500 MG CAPS Take 500 mg by mouth daily.     . Vitamin D, Ergocalciferol, (DRISDOL) 1.25 MG (50000 UT) CAPS capsule TAKE 1 CAPSULE EVERY 7 DAYS (Patient taking differently: Take 50,000 Units by mouth every Sunday. ) 12 capsule 4   No current facility-administered medications for this visit.     Objective:  BP 110/70   Pulse 69   Temp (!) 97.3 F (36.3 C)   Ht 5'  7" (1.702 m)   Wt 201 lb 12.8 oz (91.5 kg)   SpO2 98%   BMI 31.61 kg/m  Gen: NAD, resting comfortably CV: RRR no murmurs rubs or gallops Lungs: CTAB no crackles, wheeze, rhonchi Abdomen: soft/nontender/nondistended/normal bowel sounds.  Ext: no edema Skin: warm, dry Msk: tenderness/muscle spasm medial to right scapula     Assessment and Plan  1. CAD S/P multiple PCIs S: Compliant with aspirin and Brilinta.  Continues follow-up with Dr. Percival Spanish.  Had recent emergency room visit last month but thought to be musculoskeletal- had reassuring workup -started with left side pain that moved up to left upper chest/shoulder type area.  Patient is compliant with Imdur-denies having to recently use nitroglycerin- other than  when he had episode that took him to ER.    He is also compliant with statin as below A/P: CAD appears stable- continue current medications.    2. Essential hypertension S: Well-controlled today on Imdur 60 mg BP Readings from Last 3 Encounters:  09/14/19 110/70  08/05/19 (!) 159/82  07/25/19 110/70   A/P: Stable. Continue current medications.     3. Dyslipidemia S: Compliant with atorvastatin 80 mg Lab Results  Component Value Date   CHOL 108 12/17/2018   HDL 43 12/17/2018   LDLCALC 48 12/17/2018   TRIG 85 12/17/2018   CHOLHDL 2.5 12/17/2018   A/P: Excellent control on last check-we will get direct LDL with labs  4. Impaired glucose tolerance S: We have agreed to stay off Metformin as long as A1c remains below 6. Weight up a few lbs through the holidays- enjoying himself more but thinks he can cut back in the new year.  Walking 2 days a week.  Lab Results  Component Value Date   HGBA1C 5.8 (H) 12/16/2018   HGBA1C 6.1 09/29/2018   HGBA1C 6.0 12/08/2017  A/P: Hopefully A1c remains below 6 today.  Update hemoglobin A1c. Weight up 4 lbs- Encouraged need for healthy eating, regular exercise, weight loss.   5. Right scapula pain/patient concern for rib pain S: Just medial to right shoulder blade- he has noted some tenderness spasm- seems to get slightly better during the day. Generally sleeps on right side. Thinks perhaps started with a lawnmower that was hard to crank. Has been working with PT for issues on left hip. For a while was taking tylenol arthritis and that seemed to help. Has not tried muscle relaxant yet. He thinks this has been going on for a few months- including when had x-ray. Has hurt into his side as well A/P: Feels like muscle spasm medial to the scapula. We will trial muscle relaxant at least twice a day for 5 days. Also try heat 20 minutes 3x a day. Get rib films to make sure no bony abnormality and follow up on prior atelectasis on x-ray. 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 CAD S/P multiple PCIs  I25.10    Z98.61   2. Essential hypertension  I10 Comprehensive metabolic panel  3. Dyslipidemia  E78.5 Direct LDL  4. Impaired glucose tolerance  R73.02 Hemoglobin  A1c  5. Need for immunization against influenza  Z23 Flu Vaccine QUAD 36+ mos IM  6. Rib pain on right side  R07.81 DG Ribs Unilateral W/Chest Right    Meds ordered this encounter  Medications  . cyclobenzaprine (FLEXERIL) 10 MG tablet    Sig: Take 1 tablet (10 mg total) by mouth 2 (two) times daily as needed for muscle spasms.    Dispense:  20 tablet    Refill:  0    Return precautions advised.  Garret Reddish, MD

## 2019-09-21 ENCOUNTER — Other Ambulatory Visit: Payer: Self-pay | Admitting: Family Medicine

## 2019-10-24 ENCOUNTER — Ambulatory Visit: Admitting: Family Medicine

## 2019-10-25 ENCOUNTER — Ambulatory Visit (INDEPENDENT_AMBULATORY_CARE_PROVIDER_SITE_OTHER): Admitting: Family Medicine

## 2019-10-25 ENCOUNTER — Other Ambulatory Visit: Payer: Self-pay

## 2019-10-25 ENCOUNTER — Encounter: Payer: Self-pay | Admitting: Family Medicine

## 2019-10-25 VITALS — BP 118/76 | HR 72 | Ht 67.0 in | Wt 201.0 lb

## 2019-10-25 DIAGNOSIS — M999 Biomechanical lesion, unspecified: Secondary | ICD-10-CM | POA: Diagnosis not present

## 2019-10-25 DIAGNOSIS — M549 Dorsalgia, unspecified: Secondary | ICD-10-CM | POA: Diagnosis not present

## 2019-10-25 DIAGNOSIS — G8929 Other chronic pain: Secondary | ICD-10-CM | POA: Diagnosis not present

## 2019-10-25 NOTE — Patient Instructions (Signed)
See me in 3 months

## 2019-10-25 NOTE — Assessment & Plan Note (Signed)
Multifactorial.  Stable chronic condition.  Patient has gained weight since last visit.  We discussed core stability.  Discussed the importance of weight loss.  Patient will continue with muscle relaxer as needed.  Follow-up with me again 12 weeks

## 2019-10-25 NOTE — Progress Notes (Signed)
[place] Phone: [phone] Subjective:   [name], am serving as a scribe for Dr. Hulan Saas. This visit occurred during the SARS-CoV-2 public health emergency.  Safety protocols were in place, including screening questions prior to the visit, additional usage of staff PPE, and extensive cleaning of exam room while observing appropriate contact time as indicated for disinfecting solutions.   I'm seeing this patient by the request  of:  [name], MD  CC: Back pain follow-up  [id number]  Michael [name] is a 64 y.o. male coming in with complaint of back pain. Last seen on 07/25/2019 for OMT. Patient states that he has not had any issues since last visit. Has been less active.  Patient has noted some weight gain, patient states that not having a dull, throbbing aching pain that has had previously and more just of a chronic irritation of the back.  No radiation of the pain.     Past Medical History:  Diagnosis Date  . CAD S/P PCI to Cx & RCA    a. s/p PCI of LCx (2006) and ostium of RCA (2007) as well as DES to RCA in [place] (05/2014)  . HTN (hypertension)   . Hyperlipidemia    Past Surgical History:  Procedure Laterality Date  . CARDIAC CATHETERIZATION N/A 05/06/2016   Procedure: Left Heart Cath and Coronary Angiography;  Surgeon: Nelva Bush, MD;  Location: [facility];  Service: Cardiovascular;  Laterality: N/A;  . CARDIAC CATHETERIZATION N/A 05/06/2016   Procedure: Coronary Stent Intervention;  Surgeon: Nelva Bush, MD;  Location: [place] [facility];  Service: Cardiovascular;  Laterality: N/A;  . CARDIAC CATHETERIZATION N/A 05/13/2016   Procedure: Coronary/Graft Angiography;  Surgeon: Peter M Martinique, MD;  Location: [place] [facility];  Service: Cardiovascular;  Laterality: N/A;  . CORONARY ANGIOPLASTY WITH STENT PLACEMENT  2006   in [place], occluded CFX, s/p Taxus  stent  . CORONARY ANGIOPLASTY WITH STENT PLACEMENT  2007   in [place], ostial RCA stent  . CORONARY ANGIOPLASTY WITH STENT PLACEMENT  2015   in [place], RCA stent  . TONSILLECTOMY AND ADENOIDECTOMY      Social History   Socioeconomic History  . Marital status: Married    Spouse name: Not on file  . Number of children: 4  . Years of education: Not on file  . Highest education level: Not on file  Occupational History  . Occupation: retired  Tobacco Use  . Smoking status: Never Smoker  . Smokeless tobacco: Never Used  Substance and Sexual Activity  . Alcohol use: No    Alcohol/week: 0.0 standard drinks  . Drug use: No  . Sexual activity: Yes  Other Topics Concern  . Not on file  Social History Narrative   Lives with wife.  4 kids total (2 step, 2 biological). 1 that has CP. Daughter [name]- comes to [facility]).        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 Difficulty of Paying Living Expenses: Not on file  Food Insecurity:   . Worried About Charity fundraiser in the Last Year: Not on file  . Ran Out of Food in the Last Year: Not on file  Transportation Needs:   . Lack of Transportation (Medical): Not  on file  . Lack of Transportation (Non-Medical): Not on file  Physical Activity:   . Days of Exercise per Week: Not on file  . Minutes of Exercise per Session: Not on file  Stress:   . Feeling of Stress : Not on file  Social Connections:   . Frequency of Communication with Friends and Family: Not on file  . Frequency of Social Gatherings with Friends and Family: Not on file  . Attends Religious Services: Not on file  . Active Member of Clubs or Organizations: Not on file  . Attends Archivist Meetings: Not on file  . Marital Status: Not on file   No Known Allergies Family History  Problem Relation Age of  Onset  . Hypertension Father   . Dementia Father        Vascular. patient states dementia/possible alzheimers as well  . Heart disease Mother        died in 18s  . Hyperlipidemia Sister   . Cancer Brother        type unknown  . Cancer Brother        type unknown  . Heart disease Maternal Grandfather 64       Died probably of heart diseasse  . Heart disease Maternal Uncle 4       Died of "heart exploding"  . Cerebral palsy Daughter   . Stroke Son        severe problems with blood clotting     Current Outpatient Medications (Cardiovascular):  .  atorvastatin (LIPITOR) 80 MG tablet, TAKE 1 TABLET DAILY .  isosorbide mononitrate (IMDUR) 60 MG 24 hr tablet, TAKE 1 TABLET DAILY .  nitroGLYCERIN (NITROSTAT) 0.4 MG SL tablet, DISSOLVE 1 TABLET UNDER THE TONGUE EVERY 5 MINUTES AS NEEDED FOR CHEST PAIN UP TO 3 DOSES (Patient taking differently: Place 0.4 mg under the tongue every 5 (five) minutes as needed for chest pain. )   Current Outpatient Medications (Analgesics):  .  aspirin 81 MG tablet, Take 81 mg by mouth daily.  Current Outpatient Medications (Hematological):  [name]  BRILINTA 90 MG TABS tablet, TAKE 1 TABLET TWICE A DAY (Patient taking differently: Take 90 mg by mouth 2 (two) times daily. )  Current Outpatient Medications (Other):  [name]  CHERRY PO, Take 50 mg by mouth daily. [place] extract .  Cinnamon 500 MG capsule, Take 500 mg by mouth 2 (two) times daily.  .  cyclobenzaprine (FLEXERIL) 10 MG tablet, Take 1 tablet (10 mg total) by mouth 2 (two) times daily as needed for muscle spasms. .  Prasterone, DHEA, (DHEA 50 PO), Take 1 capsule by mouth daily. Take for 4 weeks, then off for 2 weeks .  tiZANidine (ZANAFLEX) 4 MG tablet, Take 4 mg by mouth at bedtime.  .  Turmeric 500 MG CAPS, Take 500 mg by mouth daily.  .  Vitamin D, Ergocalciferol, (DRISDOL) 1.25 MG (50000 UT) CAPS capsule, TAKE 1 CAPSULE EVERY 7 DAYS   Reviewed prior external information including notes and imaging  from  primary care provider As well as notes that were available from care everywhere and other healthcare systems.  Past medical history, social, surgical and family history all reviewed in electronic medical record.  No pertanent information unless stated regarding to the chief complaint.   Review of Systems:  No headache, visual changes, nausea, vomiting, diarrhea, constipation, dizziness, abdominal pain, skin rash, fevers, chills, night sweats, weight loss, swollen lymph nodes, body aches, joint swelling, chest pain, shortness of  breath, mood changes. POSITIVE muscle aches  Objective  Blood pressure 118/76, pulse 72, height 5\' 7"  (1.702 m), weight 201 lb (91.2 kg), SpO2 98 %.   General: No apparent distress alert and oriented x3 mood and affect normal, dressed appropriately.  HEENT: Pupils equal, extraocular movements intact  Respiratory: Patient's speak in full sentences and does not appear short of breath  Cardiovascular: No lower extremity edema, non tender, no erythema  Skin: Warm dry intact with no signs of infection or rash on extremities or on axial skeleton.  Abdomen: Soft nontender  Neuro: Cranial nerves II through XII are intact, neurovascularly intact in all extremities with 2+ DTRs and 2+ pulses.  Lymph: No lymphadenopathy of posterior or anterior cervical chain or axillae bilaterally.  Gait normal with good balance and coordination.  MSK:  Non tender with full range of motion and good stability and symmetric strength and tone of shoulders, elbows, wrist, hip, knee and ankles bilaterally.  Low back exam does show poor core strength.  Patient does have some mild tenderness to palpation mostly in the thoracolumbar juncture and some over the sacroiliac joints right greater than left.  Osteopathic findings  T11 extended rotated and side bent left L5 flexed rotated and side bent left  Sacrum right on right    Impression and Recommendations:     This case required medical  decision making of moderate complexity. The above documentation has been reviewed and is accurate and complete [name], DO       Note: This dictation was prepared with Dragon dictation along with smaller phrase technology. Any transcriptional errors that result from this process are unintentional.

## 2019-10-25 NOTE — Assessment & Plan Note (Signed)
Decision today to treat with OMT was based on Physical Exam  After verbal consent patient was treated with HVLA, ME, FPR techniques in  thoracic, lumbar and sacral areas  Patient tolerated the procedure well with improvement in symptoms  Patient given exercises, stretches and lifestyle modifications  See medications in patient instructions if given  Patient will follow up in 4-8 weeks 

## 2019-11-26 ENCOUNTER — Other Ambulatory Visit: Payer: Self-pay | Admitting: Cardiology

## 2019-12-05 DIAGNOSIS — Z7189 Other specified counseling: Secondary | ICD-10-CM | POA: Insufficient documentation

## 2019-12-05 DIAGNOSIS — I255 Ischemic cardiomyopathy: Secondary | ICD-10-CM | POA: Insufficient documentation

## 2019-12-05 NOTE — Progress Notes (Signed)
Cardiology Office Note   Date:  12/06/2019   ID:  Michael [name], DOB 1955-11-11, MRN [id number]  PCP:  [name], MD  Cardiologist:   Minus Breeding, MD   No chief complaint on file.     History of Present Illness: Michael [name] is a 64 y.o. male with CAD, HTN and HLD.  He moved from [place].  Cardiac catheterization done prior to moving demonstrated a right coronary artery lesion that was treated with DES.  In September 2016, he was admitted for unstable angina.  Myoview was low risk.  He had angina again in August 2017 and underwent cardiac catheterization that showed patent stent in the RCA and left circumflex with disease in mid LAD.  He received 2 additional drug-eluting stents.  He was discharged but admitted later with similar chest pain and a troponin elevation a week after discharge.  Relook catheter showed patent LAD stents, left circumflex stent and RCA stent.  There was a 70% small diagonal lesion.  It was suspected he likely had coronary spasm.  Nitrate was added. He was readmitted to the hospital on 12/16/2018 with chest pain.  CT angiogram of the chest was negative for PE.  Myoview obtained on 12/17/2018 was intermediate risk study, finding showed large defect of severe severity present in the inferolateral wall consistent with scar, EF 38% with inferior anterolateral akinesis.  This was followed up using an echocardiogram on the same day which showed EF 40 to 45%, severe akinesis of the entire inferior wall.  No further study was recommended.  He was continued on aspirin, statin and Imdur at 60 mg daily.  Since I last saw him he was in the emergency room in November with chest discomfort.  I reviewed these records for this visit but this was not felt to be anginal chest pain.  He thinks he might of pulled something.  Since that time he is doing well.  He is walking for exercise.  He does some resistance exercising.  He denies any cardiovascular symptoms. The  patient denies any new symptoms such as chest discomfort, neck or arm discomfort. There has been no new shortness of breath, PND or orthopnea. There have been no reported palpitations, presyncope or syncope.    Past Medical History:  Diagnosis Date  . CAD S/P PCI to Cx & RCA    a. s/p PCI of LCx (2006) and ostium of RCA (2007) as well as DES to RCA in [place] (05/2014)  . HTN (hypertension)   . Hyperlipidemia     Past Surgical History:  Procedure Laterality Date  . CARDIAC CATHETERIZATION N/A 05/06/2016   Procedure: Left Heart Cath and Coronary Angiography;  Surgeon: Nelva Bush, MD;  Location: [facility];  Service: Cardiovascular;  Laterality: N/A;  . CARDIAC CATHETERIZATION N/A 05/06/2016   Procedure: Coronary Stent Intervention;  Surgeon: Nelva Bush, MD;  Location: [facility];  Service: Cardiovascular;  Laterality: N/A;  . CARDIAC CATHETERIZATION N/A 05/13/2016   Procedure: Coronary/Graft Angiography;  Surgeon: Peter M Martinique, MD;  Location: [facility];  Service: Cardiovascular;  Laterality: N/A;  . CORONARY ANGIOPLASTY WITH STENT PLACEMENT  2006   in [place], occluded CFX, s/p Taxus stent  . CORONARY ANGIOPLASTY WITH STENT PLACEMENT  2007   in [place], ostial RCA stent  . CORONARY ANGIOPLASTY WITH STENT PLACEMENT  2015   in [place], RCA stent  . TONSILLECTOMY AND ADENOIDECTOMY        Current Outpatient Medications  Medication Sig Dispense Refill  . aspirin 81 MG tablet Take 81 mg by mouth daily.    [name] atorvastatin (LIPITOR) 80 MG tablet TAKE 1 TABLET DAILY 90 tablet 1  . BRILINTA 90 MG TABS tablet TAKE 1 TABLET TWICE A DAY (Patient taking differently: Take 90 mg by mouth 2 (two) times daily. ) 180 tablet 3  . CHERRY PO Take 50 mg by mouth daily. [place] extract    . Cinnamon 500 MG capsule Take 500 mg by mouth 2 (two) times daily.     . cyclobenzaprine (FLEXERIL) 10 MG tablet Take 1 tablet (10 mg total) by mouth 2 (two) times  daily as needed for muscle spasms. 20 tablet 0  . isosorbide mononitrate (IMDUR) 60 MG 24 hr tablet TAKE 1 TABLET DAILY 90 tablet 1  . nitroGLYCERIN (NITROSTAT) 0.4 MG SL tablet DISSOLVE 1 TABLET UNDER THE TONGUE EVERY 5 MINUTES AS NEEDED FOR CHEST PAIN UP TO 3 DOSES 25 tablet 1  . Prasterone, DHEA, (DHEA 50 PO) Take 1 capsule by mouth daily. Take for 4 weeks, then off for 2 weeks    . tiZANidine (ZANAFLEX) 4 MG tablet Take 4 mg by mouth at bedtime.     . Turmeric 500 MG CAPS Take 500 mg by mouth daily.     . Vitamin D, Ergocalciferol, (DRISDOL) 1.25 MG (50000 UT) CAPS capsule TAKE 1 CAPSULE EVERY 7 DAYS 12 capsule 3   No current facility-administered medications for this visit.    Allergies:   Patient has no known allergies.   ROS:  Please see the history of present illness.   Otherwise, review of systems are positive for none.   All other systems are reviewed and negative.    PHYSICAL EXAM: VS:  BP 110/68   Pulse 64   Resp 15   Ht 5\' 7"  (1.702 m)   Wt 203 lb (92.1 kg)   SpO2 98%   BMI 31.79 kg/m  , BMI Body mass index is 31.79 kg/m. GENERAL:  Well appearing NECK:  No jugular venous distention, waveform within normal limits, carotid upstroke brisk and symmetric, no bruits, no thyromegaly LUNGS:  Clear to auscultation bilaterally CHEST:  Unremarkable HEART:  PMI not displaced or sustained,S1 and S2 within normal limits, no S3, no S4, no clicks, no rubs, no murmurs ABD:  Flat, positive bowel sounds normal in frequency in pitch, no bruits, no rebound, no guarding, no midline pulsatile mass, no hepatomegaly, no splenomegaly EXT:  2 plus pulses throughout, no edema, no cyanosis no clubbing   EKG:  EKG is ordered today. The ekg ordered today demonstrates sinus rhythm, rate 64, axis within normal limits, intervals within normal limits, no acute ST-T wave changes.   Recent Labs: 12/16/2018: Magnesium 1.8; TSH 2.646 08/05/2019: Hemoglobin 14.2; Platelets 246 09/14/2019: ALT 25; BUN  19; Creatinine, Ser 0.98; Potassium 4.2; Sodium 139    Lipid Panel    Component Value Date/Time   CHOL 108 12/17/2018 0520   TRIG 85 12/17/2018 0520   HDL 43 12/17/2018 0520   CHOLHDL 2.5 12/17/2018 0520   VLDL 17 12/17/2018 0520   LDLCALC 48 12/17/2018 0520   LDLDIRECT 62.0 09/14/2019 0851      Wt Readings from Last 3 Encounters:  12/06/19 203 lb (92.1 kg)  10/25/19 201 lb (91.2 kg)  09/14/19 201 lb 12.8 oz (91.5 kg)      Other studies Reviewed: Additional studies/ records that were reviewed today include: ED records. Review of the above records demonstrates:  Please see elsewhere in the note.     ASSESSMENT AND PLAN:  Ischemic cardiomyopathy:    The patient had a mildly reduced ejection fraction.  His blood pressure somewhat low.  I discussed with the potential titration of the low-dose of ARB.  I will follow up with echocardiography prior to the next visit.  He is asymptomatic.  CAD:  Patient's had no symptoms.  I am making a conscious decision to keep him on aspirin and Brilinta given his multiple stents and interventions.   Hypertension:   The patient's had borderline blood pressures on not going to push med titration at this point we will follow-up echocardiography in the future.   Hyperlipidemia:  LDL is excellent at 62.  No change in therapy.   Current medicines are reviewed at length with the patient today.  The patient does not have concerns regarding medicines.  The following changes have been made:  no change  Labs/ tests ordered today include: None No orders of the defined types were placed in this encounter.    Disposition:   FU with me in 12 months.     Signed, Minus Breeding, MD  12/06/2019 1:57 PM    [facility]

## 2019-12-06 ENCOUNTER — Other Ambulatory Visit: Payer: Self-pay

## 2019-12-06 ENCOUNTER — Encounter: Payer: Self-pay | Admitting: Cardiology

## 2019-12-06 ENCOUNTER — Ambulatory Visit (INDEPENDENT_AMBULATORY_CARE_PROVIDER_SITE_OTHER): Admitting: Cardiology

## 2019-12-06 VITALS — BP 110/68 | HR 64 | Resp 15 | Ht 67.0 in | Wt 203.0 lb

## 2019-12-06 DIAGNOSIS — I10 Essential (primary) hypertension: Secondary | ICD-10-CM

## 2019-12-06 DIAGNOSIS — I255 Ischemic cardiomyopathy: Secondary | ICD-10-CM | POA: Diagnosis not present

## 2019-12-06 DIAGNOSIS — I2510 Atherosclerotic heart disease of native coronary artery without angina pectoris: Secondary | ICD-10-CM | POA: Diagnosis not present

## 2019-12-06 DIAGNOSIS — E785 Hyperlipidemia, unspecified: Secondary | ICD-10-CM

## 2019-12-06 DIAGNOSIS — Z7189 Other specified counseling: Secondary | ICD-10-CM

## 2019-12-06 NOTE — Patient Instructions (Signed)
Medication Instructions:  No changes *If you need a refill on your cardiac medications before your next appointment, please call your pharmacy*  Lab Work: None needed this visit  Testing/Procedures: None needed this visit  Follow-Up: At [facility], you and your health needs are our priority.  As part of our continuing mission to provide you with exceptional heart care, we have created designated Provider Care Teams.  These Care Teams include your primary Cardiologist (physician) and Advanced Practice Providers (APPs -  Physician Assistants and Nurse Practitioners) who all work together to provide you with the care you need, when you need it.  Your next appointment:   1 year(s)  You will receive a reminder letter in the mail two months in advance. If you don't receive a letter, please call our office to schedule the follow-up appointment.  The format for your next appointment:   In Person  Provider:   James Hochrein, MD  

## 2020-01-18 ENCOUNTER — Encounter: Payer: Self-pay | Admitting: Family Medicine

## 2020-01-18 ENCOUNTER — Other Ambulatory Visit: Payer: Self-pay

## 2020-01-18 ENCOUNTER — Ambulatory Visit (INDEPENDENT_AMBULATORY_CARE_PROVIDER_SITE_OTHER): Admitting: Family Medicine

## 2020-01-18 VITALS — BP 106/54 | HR 67 | Ht 67.0 in | Wt 199.0 lb

## 2020-01-18 DIAGNOSIS — M549 Dorsalgia, unspecified: Secondary | ICD-10-CM

## 2020-01-18 DIAGNOSIS — G8929 Other chronic pain: Secondary | ICD-10-CM

## 2020-01-18 DIAGNOSIS — M999 Biomechanical lesion, unspecified: Secondary | ICD-10-CM | POA: Diagnosis not present

## 2020-01-18 NOTE — Patient Instructions (Signed)
Great to see you Michael [name] is your friend See me again in 3 months

## 2020-01-18 NOTE — Assessment & Plan Note (Signed)

## 2020-01-18 NOTE — Progress Notes (Signed)
[place] Phone: [phone] Subjective:   Michael [name], am serving as a scribe for Dr. Hulan Saas. This visit occurred during the SARS-CoV-2 public health emergency.  Safety protocols were in place, including screening questions prior to the visit, additional usage of staff PPE, and extensive cleaning of exam room while observing appropriate contact time as indicated for disinfecting solutions.    I'm seeing this patient by the request  of:  [name], MD  CC: Low back  [id number]  Michael [name] is a 64 y.o. male coming in with complaint of back pain. Last seen on 10/25/2019. Patient states that he has not had any issues since last visit.  Has been 3 months since we have seen patient.  Doing relatively well at the moment.  Some mild tightness but nothing that stops him.  Not taking any medicines on a regular basis.    Past Medical History:  Diagnosis Date  . CAD S/P PCI to Cx & RCA    a. s/p PCI of LCx (2006) and ostium of RCA (2007) as well as DES to RCA in [place] (05/2014)  . HTN (hypertension)   . Hyperlipidemia    Past Surgical History:  Procedure Laterality Date  . CARDIAC CATHETERIZATION N/A 05/06/2016   Procedure: Left Heart Cath and Coronary Angiography;  Surgeon: Nelva Bush, MD;  Location: [facility];  Service: Cardiovascular;  Laterality: N/A;  . CARDIAC CATHETERIZATION N/A 05/06/2016   Procedure: Coronary Stent Intervention;  Surgeon: Nelva Bush, MD;  Location: [place] [facility];  Service: Cardiovascular;  Laterality: N/A;  . CARDIAC CATHETERIZATION N/A 05/13/2016   Procedure: Coronary/Graft Angiography;  Surgeon: Peter M Martinique, MD;  Location: [place] [facility];  Service: Cardiovascular;  Laterality: N/A;  . CORONARY ANGIOPLASTY WITH STENT PLACEMENT  2006   in [place], occluded CFX, s/p Taxus stent  . CORONARY ANGIOPLASTY WITH STENT PLACEMENT  2007   in  [place], ostial RCA stent  . CORONARY ANGIOPLASTY WITH STENT PLACEMENT  2015   in [place], RCA stent  . TONSILLECTOMY AND ADENOIDECTOMY      Social History   Socioeconomic History  . Marital status: Married    Spouse name: Not on file  . Number of children: 4  . Years of education: Not on file  . Highest education level: Not on file  Occupational History  . Occupation: retired  Tobacco Use  . Smoking status: Never Smoker  . Smokeless tobacco: Never Used  Substance and Sexual Activity  . Alcohol use: [name]    Alcohol/week: 0.0 standard drinks  . Drug use: [name]  . Sexual activity: Yes  Other Topics Concern  . Not on file  Social History Narrative   Lives with wife.  4 kids total (2 step, 2 biological). 1 that has CP. Daughter [name]- comes to [facility]).        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 Difficulty of Paying Living Expenses:   Food Insecurity:   . Worried About Charity fundraiser in the Last Year:   . Arboriculturist in the Last Year:   Transportation Needs:   . Film/video editor (Medical):   [name] Lack of Transportation (Non-Medical):   Physical Activity:   . Days of  Exercise per Week:   . Minutes of Exercise per Session:   Stress:   . Feeling of Stress :   Social Connections:   . Frequency of Communication with Friends and Family:   . Frequency of Social Gatherings with Friends and Family:   . Attends Religious Services:   . Active Member of Clubs or Organizations:   . Attends Archivist Meetings:   [name] Marital Status:    [name] Known Allergies Family History  Problem Relation Age of Onset  . Hypertension Father   . Dementia Father        Vascular. patient states dementia/possible alzheimers as well  . Heart disease Mother        died in 43s  . Hyperlipidemia Sister   . Cancer Brother         type unknown  . Cancer Brother        type unknown  . Heart disease Maternal Grandfather 64       Died probably of heart diseasse  . Heart disease Maternal Uncle 78       Died of "heart exploding"  . Cerebral palsy Daughter   . Stroke Son        severe problems with blood clotting     Current Outpatient Medications (Cardiovascular):  .  atorvastatin (LIPITOR) 80 MG tablet, TAKE 1 TABLET DAILY .  isosorbide mononitrate (IMDUR) 60 MG 24 hr tablet, TAKE 1 TABLET DAILY .  nitroGLYCERIN (NITROSTAT) 0.4 MG SL tablet, DISSOLVE 1 TABLET UNDER THE TONGUE EVERY 5 MINUTES AS NEEDED FOR CHEST PAIN UP TO 3 DOSES   Current Outpatient Medications (Analgesics):  .  aspirin 81 MG tablet, Take 81 mg by mouth daily.  Current Outpatient Medications (Hematological):  [name]  BRILINTA 90 MG TABS tablet, TAKE 1 TABLET TWICE A DAY (Patient taking differently: Take 90 mg by mouth 2 (two) times daily. )  Current Outpatient Medications (Other):  [name]  CHERRY PO, Take 50 mg by mouth daily. [place] extract .  Cinnamon 500 MG capsule, Take 500 mg by mouth 2 (two) times daily.  .  cyclobenzaprine (FLEXERIL) 10 MG tablet, Take 1 tablet (10 mg total) by mouth 2 (two) times daily as needed for muscle spasms. .  Prasterone, DHEA, (DHEA 50 PO), Take 1 capsule by mouth daily. Take for 4 weeks, then off for 2 weeks .  tiZANidine (ZANAFLEX) 4 MG tablet, Take 4 mg by mouth at bedtime.  .  Turmeric 500 MG CAPS, Take 500 mg by mouth daily.  .  Vitamin D, Ergocalciferol, (DRISDOL) 1.25 MG (50000 UT) CAPS capsule, TAKE 1 CAPSULE EVERY 7 DAYS   Reviewed prior external information including notes and imaging from  primary care provider As well as notes that were available from care everywhere and other healthcare systems.  Past medical history, social, surgical and family history all reviewed in electronic medical record.  [name] pertanent information unless stated regarding to the chief complaint.   Review of Systems:  [name] headache,  visual changes, nausea, vomiting, diarrhea, constipation, dizziness, abdominal pain, skin rash, fevers, chills, night sweats, weight loss, swollen lymph nodes, body aches, joint swelling, chest pain, shortness of breath, mood changes. POSITIVE muscle aches  Objective  Blood pressure (!) 106/54, pulse 67, height 5\' 7"  (1.702 m), weight 199 lb (90.3 kg), SpO2 97 %.   General: [name] apparent distress alert and oriented x3 mood and affect normal, dressed appropriately.  HEENT: Pupils equal, extraocular movements intact  Respiratory: Patient's speak in  full sentences and does not appear short of breath  Cardiovascular: [name] lower extremity edema, non tender, [name] erythema  Neuro: Cranial nerves II through XII are intact, neurovascularly intact in all extremities with 2+ DTRs and 2+ pulses.  Gait normal with good balance and coordination.  MSK: Back exam shows some loss of lordosis.  Poor core strength noted.  Patient does have some mild weakness in hip abductors bilaterally with 4-5 strength.  Negative straight leg test.  Tightness of the hamstrings are noted.  Deep tendon reflexes intact  Osteopathic findings  T11 extended rotated and side bent right L2 flexed rotated and side bent right Sacrum right on right    Impression and Recommendations:     This case required medical decision making of moderate complexity. The above documentation has been reviewed and is accurate and complete [name], DO       Note: This dictation was prepared with Dragon dictation along with smaller phrase technology. Any transcriptional errors that result from this process are unintentional.

## 2020-01-18 NOTE — Assessment & Plan Note (Signed)
Chronic problem but only mild exacerbation..  Discussed the management of medications including the Flexeril.  Discussed icing regimen and home exercise, discussed Zanaflex is a potential instead of the Flexeril.  Patient will continue to increase activity slowly.  Discussed icing regimen.  Discussed home exercises.  Follow-up again in 3 months

## 2020-01-23 ENCOUNTER — Ambulatory Visit: Admitting: Family Medicine

## 2020-02-21 ENCOUNTER — Telehealth (INDEPENDENT_AMBULATORY_CARE_PROVIDER_SITE_OTHER): Admitting: Physician Assistant

## 2020-02-21 ENCOUNTER — Encounter: Payer: Self-pay | Admitting: Physician Assistant

## 2020-02-21 ENCOUNTER — Other Ambulatory Visit: Payer: Self-pay

## 2020-02-21 VITALS — Ht 67.0 in | Wt 195.0 lb

## 2020-02-21 DIAGNOSIS — L247 Irritant contact dermatitis due to plants, except food: Secondary | ICD-10-CM

## 2020-02-21 DIAGNOSIS — W57XXXA Bitten or stung by nonvenomous insect and other nonvenomous arthropods, initial encounter: Secondary | ICD-10-CM

## 2020-02-21 MED ORDER — PREDNISONE 10 MG PO TABS: ORAL_TABLET | ORAL | 0 refills | Status: DC

## 2020-02-21 NOTE — Progress Notes (Signed)
TELEPHONE ENCOUNTER   Patient verbally agreed to telephone visit and is aware that copayment and coinsurance may apply. Patient was treated using telemedicine according to accepted telemedicine protocols.  Location of the patient: home Location of provider: [place] Names of all persons participating in the telemedicine service and role in the encounter: Inda Coke, [place] , Michael [name] (patient)  Subjective:   Chief Complaint  Patient presents with  . Poison Ivy     HPI   Poison ivy Pt c/o poison ivy rash on arms and legs and is spreading with time. Pt was working outside over the weekend. Has had issues with significant itching and oozing. Pt has been taking Benadry and Calamine lotion.   Thinks also that he may have had some insect bites, however does not feel like any of them are infected. Denies: purulent discharge, fever, chills, malaise  Patient Active Problem List   Diagnosis Date Noted  . Ischemic cardiomyopathy 12/05/2019  . Educated about COVID-19 virus infection 12/05/2019  . Angina at rest [facility]) 12/16/2018  . History of adenomatous polyp of colon 06/07/2018  . BPH (benign prostatic hyperplasia) 06/07/2018  . Vitiligo 06/07/2018  . Gluteal pain 04/10/2018  . Plantar fasciitis 10/12/2017  . Back pain 04/13/2017  . Nonallopathic lesion of thoracic region 04/13/2017  . Nonallopathic lesion of sacral region 04/13/2017  . Nonallopathic lesion of lumbosacral region 04/13/2017  . Impaired glucose tolerance 11/03/2016  . Obesity   . HTN (hypertension)   . CAD S/P multiple PCIs 09/08/2014  . Dyslipidemia 09/08/2014   Social History   Tobacco Use  . Smoking status: Never Smoker  . Smokeless tobacco: Never Used  Substance Use Topics  . Alcohol use: No    Alcohol/week: 0.0 standard drinks    Current Outpatient Medications:  .  aspirin 81 MG tablet, Take 81 mg by mouth daily., Disp: , Rfl:  .  atorvastatin (LIPITOR) 80 MG tablet, TAKE 1  TABLET DAILY, Disp: 90 tablet, Rfl: 1 .  BRILINTA 90 MG TABS tablet, TAKE 1 TABLET TWICE A DAY (Patient taking differently: Take 90 mg by mouth 2 (two) times daily. ), Disp: 180 tablet, Rfl: 3 .  CHERRY PO, Take 50 mg by mouth daily. Cherry extract, Disp: , Rfl:  .  Cinnamon 500 MG capsule, Take 500 mg by mouth 2 (two) times daily. , Disp: , Rfl:  .  cyclobenzaprine (FLEXERIL) 10 MG tablet, Take 1 tablet (10 mg total) by mouth 2 (two) times daily as needed for muscle spasms., Disp: 20 tablet, Rfl: 0 .  isosorbide mononitrate (IMDUR) 60 MG 24 hr tablet, TAKE 1 TABLET DAILY, Disp: 90 tablet, Rfl: 1 .  nitroGLYCERIN (NITROSTAT) 0.4 MG SL tablet, DISSOLVE 1 TABLET UNDER THE TONGUE EVERY 5 MINUTES AS NEEDED FOR CHEST PAIN UP TO 3 DOSES, Disp: 25 tablet, Rfl: 1 .  Prasterone, DHEA, (DHEA 50 PO), Take 1 capsule by mouth daily. Take for 4 weeks, then off for 2 weeks, Disp: , Rfl:  .  tiZANidine (ZANAFLEX) 4 MG tablet, Take 4 mg by mouth at bedtime. , Disp: , Rfl:  .  Turmeric 500 MG CAPS, Take 500 mg by mouth daily. , Disp: , Rfl:  .  Vitamin D, Ergocalciferol, (DRISDOL) 1.25 MG (50000 UT) CAPS capsule, TAKE 1 CAPSULE EVERY 7 DAYS, Disp: 12 capsule, Rfl: 3 .  predniSONE (DELTASONE) 10 MG tablet, Take two tablets daily x 1 week, then one tablet daily x 1 week., Disp: 21 tablet, Rfl: 0  No Known Allergies  Assessment & Plan:   1. Plant irritant contact dermatitis   2. Insect bite, unspecified site, initial encounter   Will trial oral prednisone for his poison ivy. May continue oral benadryl prn and topical calamine prn. Denies concerns for infection, however did discuss that if any of the areas become tender or have worsening redness, or he develops fever/chills/malaise, to let us know.  No orders of the defined types were placed in this encounter.  Meds ordered this encounter  Medications  . predniSONE (DELTASONE) 10 MG tablet    Sig: Take two tablets daily x 1 week, then one tablet daily x 1 week.     Dispense:  21 tablet    Refill:  0    Order Specific Question:   Supervising Provider    Answer:   Maryruth Eve    Inda Coke, PA 02/21/2020  Time spent with the patient: 6 minutes, spent in obtaining information about his symptoms, reviewing his previous labs, evaluations, and treatments, counseling him about his condition (please see the discussed topics above), and developing a plan to further investigate it; he had a number of questions which I addressed.   [id number] physician/qualified health professional telephone evaluation 5 to 10 minutes [id number] physician/qualified help functional [place] evaluation for 11 to 20 minutes [id number] physician/qualify he will professional telephone evaluation for 21 to 30 minutes

## 2020-03-02 ENCOUNTER — Telehealth: Payer: Self-pay | Admitting: Family Medicine

## 2020-03-02 ENCOUNTER — Other Ambulatory Visit: Payer: Self-pay

## 2020-03-02 MED ORDER — VITAMIN D (ERGOCALCIFEROL) 1.25 MG (50000 UNIT) PO CAPS: ORAL_CAPSULE | ORAL | 0 refills | Status: DC

## 2020-03-02 NOTE — Telephone Encounter (Signed)
Pt some issues with his RX coverage, needs 1 month of the weekly Vit D called into [facility] [address].

## 2020-03-05 IMAGING — DX CHEST - 2 VIEW
2 series · 2 of 2 positions shown · non-contrast
Comparison: PA and lateral chest 04/23/2017 and 05/10/2016.

CLINICAL DATA: Onset chest pain at 8 a.m. this morning.

EXAM:
CHEST - 2 VIEW

[w chest pa]
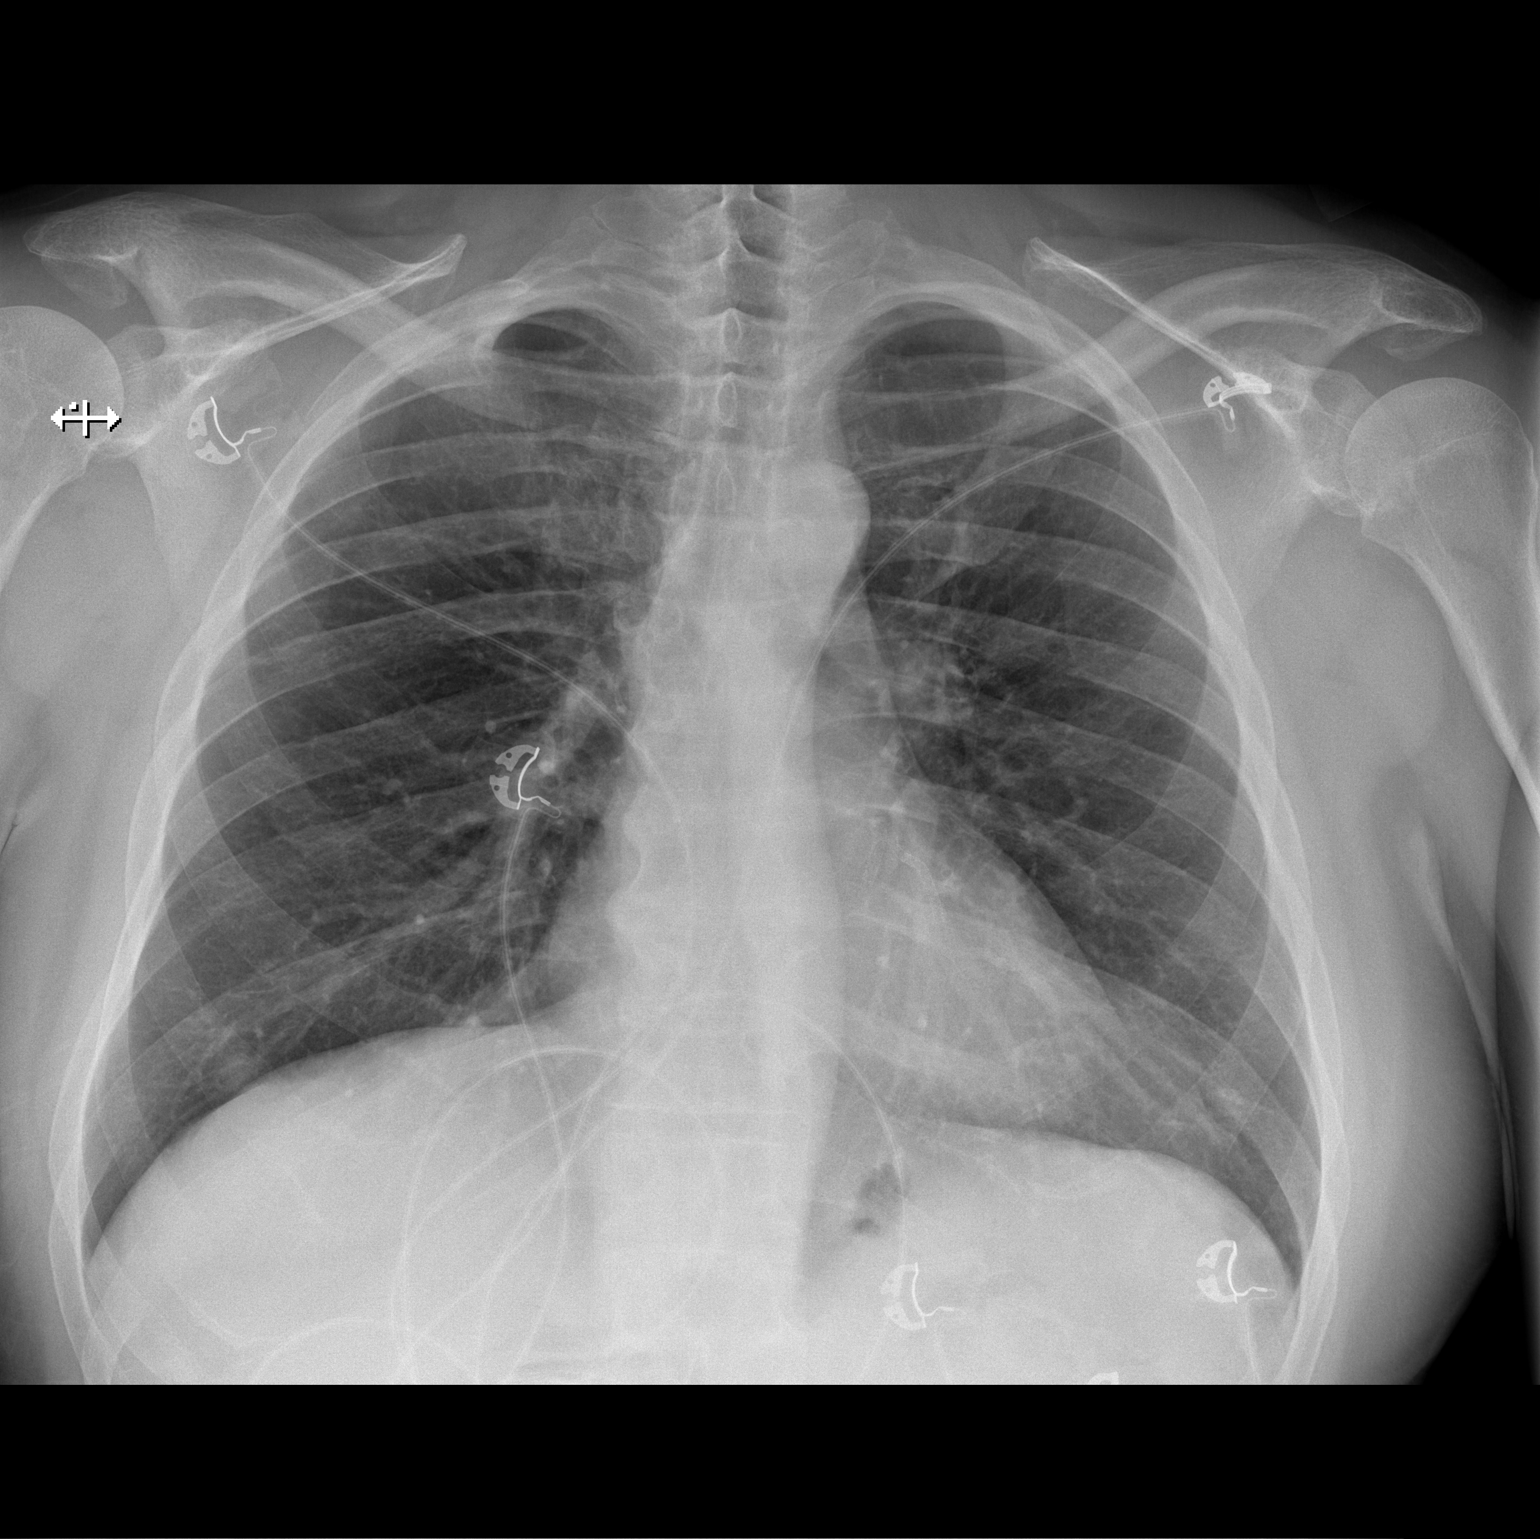

[w chest lat]
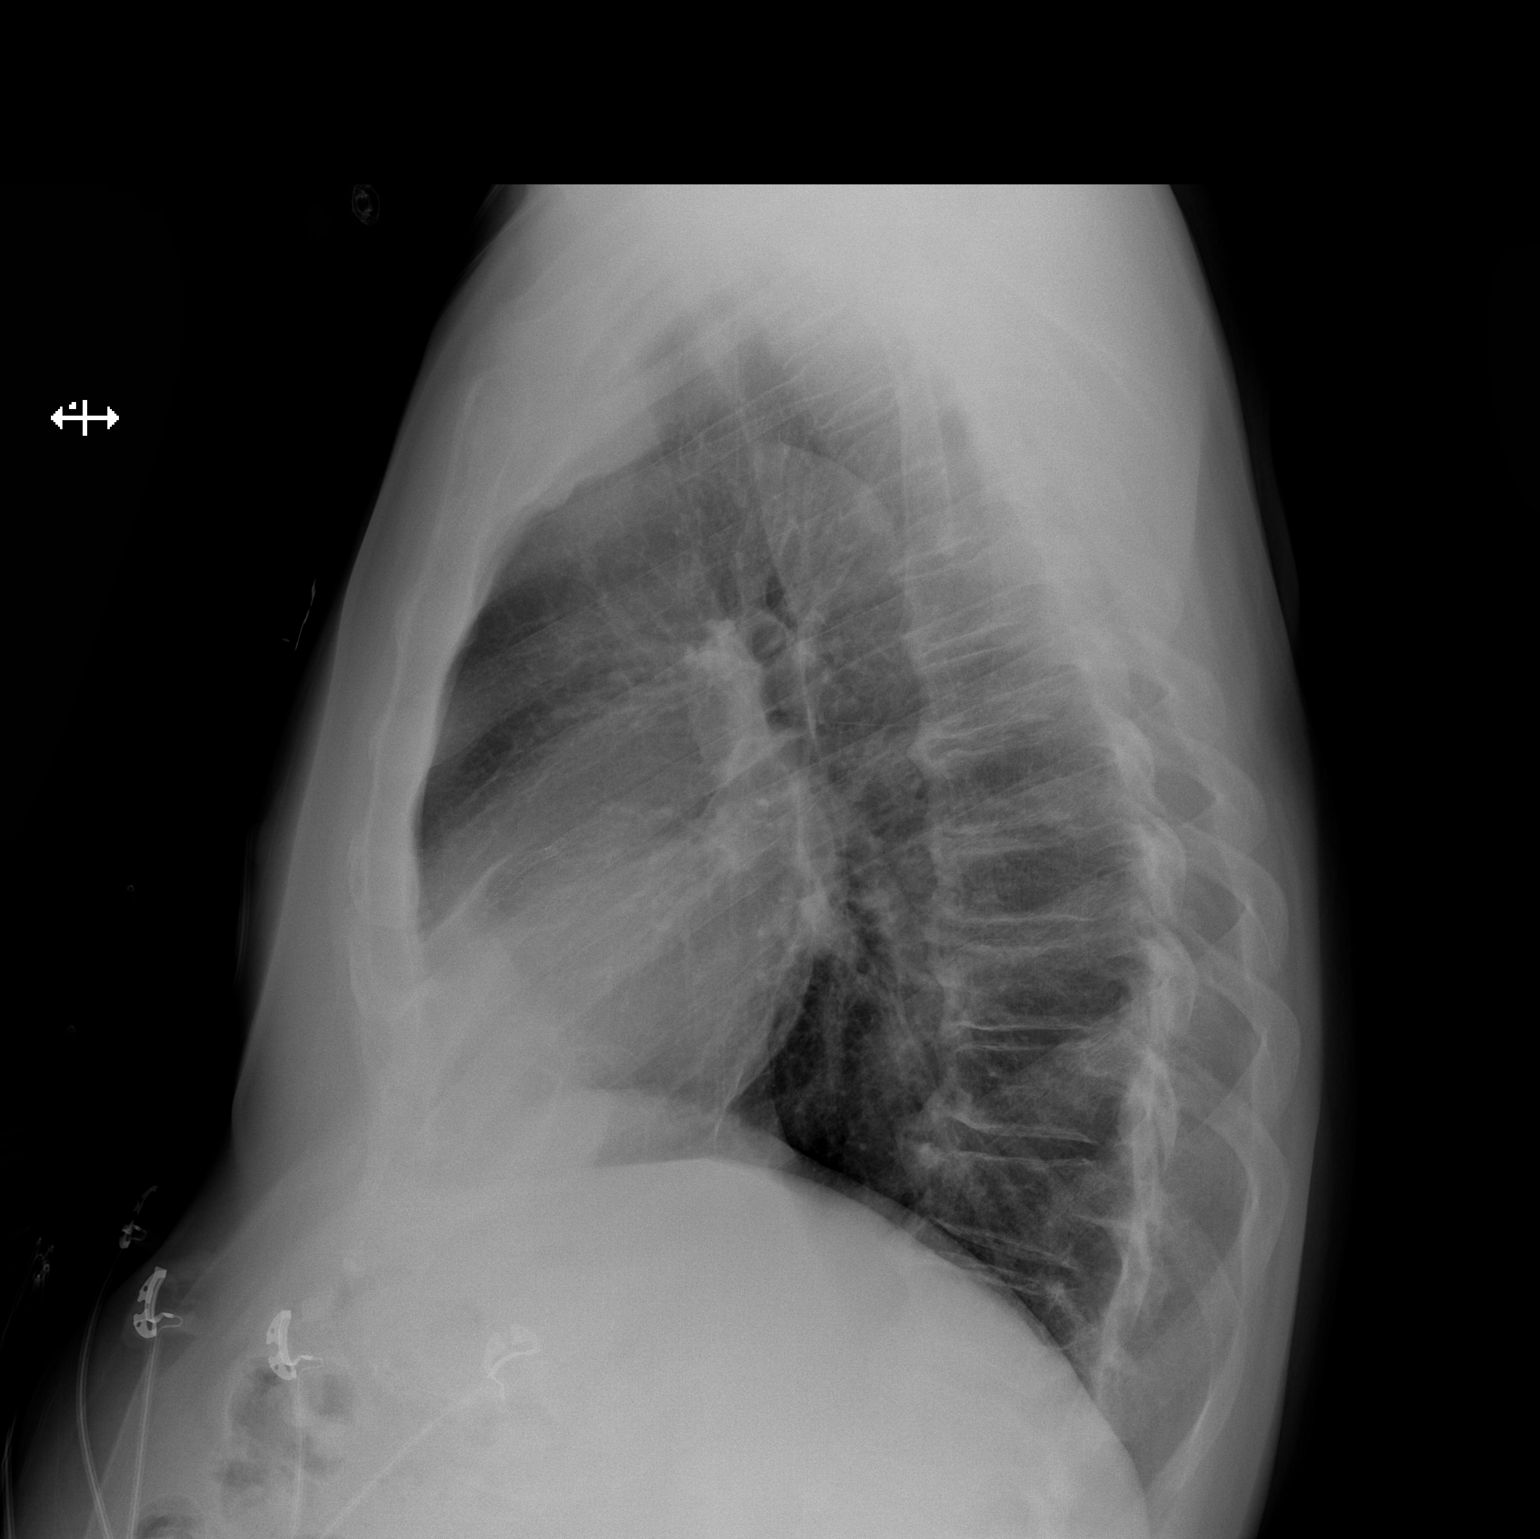

[2 of 2 positions shown; findings below may reference images not displayed]

FINDINGS: The lungs are clear. Heart size is normal. No pneumothorax or
pleural fluid. No acute or focal bony abnormality.
IMPRESSION: Negative chest.

## 2020-03-12 ENCOUNTER — Other Ambulatory Visit: Payer: Self-pay | Admitting: Cardiology

## 2020-03-12 NOTE — Telephone Encounter (Signed)
*  STAT* If patient is at the pharmacy, call can be transferred to refill team.   1. Which medications need to be refilled? (please list name of each medication and dose if known) Brilinta  2. Which pharmacy/location (including street and city if local pharmacy) is medication to be sent to? [facility]  3. Do they need a 30 day or 90 day supply? 90 days and refills

## 2020-03-15 ENCOUNTER — Encounter: Admitting: Family Medicine

## 2020-03-22 ENCOUNTER — Encounter: Admitting: Family Medicine

## 2020-04-18 ENCOUNTER — Ambulatory Visit (INDEPENDENT_AMBULATORY_CARE_PROVIDER_SITE_OTHER): Payer: Medicare Other | Admitting: Family Medicine

## 2020-04-18 ENCOUNTER — Other Ambulatory Visit: Payer: Self-pay

## 2020-04-18 ENCOUNTER — Encounter: Payer: Self-pay | Admitting: Family Medicine

## 2020-04-18 VITALS — BP 128/60 | HR 78 | Ht 67.0 in | Wt 208.0 lb

## 2020-04-18 DIAGNOSIS — M549 Dorsalgia, unspecified: Secondary | ICD-10-CM

## 2020-04-18 DIAGNOSIS — G8929 Other chronic pain: Secondary | ICD-10-CM | POA: Diagnosis not present

## 2020-04-18 DIAGNOSIS — I255 Ischemic cardiomyopathy: Secondary | ICD-10-CM

## 2020-04-18 DIAGNOSIS — M999 Biomechanical lesion, unspecified: Secondary | ICD-10-CM

## 2020-04-18 NOTE — Assessment & Plan Note (Signed)

## 2020-04-18 NOTE — Progress Notes (Signed)
[facility] [place] Phone: [phone] Subjective:   Michael [name], am serving as a scribe for Dr. Hulan Saas. This visit occurred during the SARS-CoV-2 public health emergency.  Safety protocols were in place, including screening questions prior to the visit, additional usage of staff PPE, and extensive cleaning of exam room while observing appropriate contact time as indicated for disinfecting solutions.   I'm seeing this patient by the request  of:  [name], MD  CC: Low back pain follow-up  [id number]  Michael [name] is a 64 y.o. male coming in with complaint of back pain. OMT 01/18/2020. Patient has been doing very well overall.  Has been 3 months.  Noted some mild increase in tightness.  Not doing the exercises quite as frequently.  Has Flexeril for breakthrough pain has only used it very seldomly    Past Medical History:  Diagnosis Date  . CAD S/P PCI to Cx & RCA    a. s/p PCI of LCx (2006) and ostium of RCA (2007) as well as DES to RCA in [place] (05/2014)  . HTN (hypertension)   . Hyperlipidemia    Past Surgical History:  Procedure Laterality Date  . CARDIAC CATHETERIZATION N/A 05/06/2016   Procedure: Left Heart Cath and Coronary Angiography;  Surgeon: Nelva Bush, MD;  Location: [place] [facility];  Service: Cardiovascular;  Laterality: N/A;  . CARDIAC CATHETERIZATION N/A 05/06/2016   Procedure: Coronary Stent Intervention;  Surgeon: Nelva Bush, MD;  Location: [place] [facility];  Service: Cardiovascular;  Laterality: N/A;  . CARDIAC CATHETERIZATION N/A 05/13/2016   Procedure: Coronary/Graft Angiography;  Surgeon: Peter M Martinique, MD;  Location: [place] [facility];  Service: Cardiovascular;  Laterality: N/A;  . CORONARY ANGIOPLASTY WITH STENT PLACEMENT  2006   in [place], occluded CFX, s/p Taxus stent  . CORONARY ANGIOPLASTY WITH STENT PLACEMENT  2007   in [place], ostial  RCA stent  . CORONARY ANGIOPLASTY WITH STENT PLACEMENT  2015   in [place], RCA stent  . TONSILLECTOMY AND ADENOIDECTOMY      Social History   Socioeconomic History  . Marital status: Married    Spouse name: Not on file  . Number of children: 4  . Years of education: Not on file  . Highest education level: Not on file  Occupational History  . Occupation: retired  Tobacco Use  . Smoking status: Never Smoker  . Smokeless tobacco: Never Used  Vaping Use  . Vaping Use: Never used  Substance and Sexual Activity  . Alcohol use: [name]    Alcohol/week: 0.0 standard drinks  . Drug use: [name]  . Sexual activity: Yes  Other Topics Concern  . Not on file  Social History Narrative   Lives with wife.  4 kids total (2 step, 2 biological). 1 that has CP. Daughter [name]- comes to [facility]).        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 Difficulty of Paying Living Expenses:   Food Insecurity:   . Worried About Charity fundraiser in the Last Year:   . Arboriculturist in the Last Year:   Transportation Needs:   . Film/video editor (Medical):   [name] Lack of Transportation (Non-Medical):   Physical Activity:   . Days of  Exercise per Week:   . Minutes of Exercise per Session:   Stress:   . Feeling of Stress :   Social Connections:   . Frequency of Communication with Friends and Family:   . Frequency of Social Gatherings with Friends and Family:   . Attends Religious Services:   . Active Member of Clubs or Organizations:   . Attends Archivist Meetings:   [name] Marital Status:    [name] Known Allergies Family History  Problem Relation Age of Onset  . Hypertension Father   . Dementia Father        Vascular. patient states dementia/possible alzheimers as well  . Heart disease Mother        died in 43s  . Hyperlipidemia Sister   .  Cancer Brother        type unknown  . Cancer Brother        type unknown  . Heart disease Maternal Grandfather 67       Died probably of heart diseasse  . Heart disease Maternal Uncle 17       Died of "heart exploding"  . Cerebral palsy Daughter   . Stroke Son        severe problems with blood clotting    Current Outpatient Medications (Endocrine & Metabolic):  .  predniSONE (DELTASONE) 10 MG tablet, Take two tablets daily x 1 week, then one tablet daily x 1 week.  Current Outpatient Medications (Cardiovascular):  .  atorvastatin (LIPITOR) 80 MG tablet, TAKE 1 TABLET DAILY .  isosorbide mononitrate (IMDUR) 60 MG 24 hr tablet, TAKE 1 TABLET DAILY .  nitroGLYCERIN (NITROSTAT) 0.4 MG SL tablet, DISSOLVE 1 TABLET UNDER THE TONGUE EVERY 5 MINUTES AS NEEDED FOR CHEST PAIN UP TO 3 DOSES   Current Outpatient Medications (Analgesics):  .  aspirin 81 MG tablet, Take 81 mg by mouth daily.  Current Outpatient Medications (Hematological):  [name]  BRILINTA 90 MG TABS tablet, TAKE 1 TABLET TWICE A DAY  Current Outpatient Medications (Other):  [name]  CHERRY PO, Take 50 mg by mouth daily. [place] extract .  Cinnamon 500 MG capsule, Take 500 mg by mouth 2 (two) times daily.  .  cyclobenzaprine (FLEXERIL) 10 MG tablet, Take 1 tablet (10 mg total) by mouth 2 (two) times daily as needed for muscle spasms. .  Prasterone, DHEA, (DHEA 50 PO), Take 1 capsule by mouth daily. Take for 4 weeks, then off for 2 weeks .  tiZANidine (ZANAFLEX) 4 MG tablet, Take 4 mg by mouth at bedtime.  .  Turmeric 500 MG CAPS, Take 500 mg by mouth daily.  .  Vitamin D, Ergocalciferol, (DRISDOL) 1.25 MG (50000 UNIT) CAPS capsule, TAKE 1 CAPSULE EVERY 7 DAYS   Reviewed prior external information including notes and imaging from  primary care provider As well as notes that were available from care everywhere and other healthcare systems.  Past medical history, social, surgical and family history all reviewed in electronic medical  record.  [name] pertanent information unless stated regarding to the chief complaint.   Review of Systems:  [name] headache, visual changes, nausea, vomiting, diarrhea, constipation, dizziness, abdominal pain, skin rash, fevers, chills, night sweats, weight loss, swollen lymph nodes, body aches, joint swelling, chest pain, shortness of breath, mood changes. POSITIVE muscle aches  Objective  Blood pressure (!) 128/60, pulse 78, height 5\' 7"  (1.702 m), weight (!) 208 lb (94.3 kg), SpO2 98 %.   General: [name] apparent distress alert and oriented x3 mood and  affect normal, dressed appropriately.  Vitiligo present HEENT: Pupils equal, extraocular movements intact  Respiratory: Patient's speak in full sentences and does not appear short of breath  Cardiovascular: [name] lower extremity edema, non tender, [name] erythema  Neuro: Cranial nerves II through XII are intact, neurovascularly intact in all extremities with 2+ DTRs and 2+ pulses.  Gait normal with good balance and coordination.  MSK:  Non tender with full range of motion and good stability and symmetric strength and tone of shoulders, elbows, wrist, hip, knee and ankles bilaterally.  Mild arthritic changes of multiple joints.  Patient's low back does have some tenderness to palpation in the paraspinal musculature lumbar spine right greater than left.  Patient does have tightness with Corky Sox test bilaterally.  Tightness with straight leg test but [name] radicular symptoms.  Osteopathic findings  T9 extended rotated and side bent left L2 flexed rotated and side bent right Sacrum right on right    Impression and Recommendations:     The above documentation has been reviewed and is accurate and complete [name], DO       Note: This dictation was prepared with Dragon dictation along with smaller phrase technology. Any transcriptional errors that result from this process are unintentional.

## 2020-04-18 NOTE — Assessment & Plan Note (Signed)
Multifactorial.  Chronic problem with mild exacerbation.  Has Flexeril and discussed using Ambien very occasionally.  Patient encouraged to do the exercises on a more frequent basis.  Has responded to physical therapy in the past.  Do not feel further imaging is necessary at this time.  Patient will follow-up again in 2 to 3 months.

## 2020-04-18 NOTE — Patient Instructions (Signed)
Try to stretch more often See me in 10-12 weeks

## 2020-04-19 ENCOUNTER — Encounter: Payer: Self-pay | Admitting: Family Medicine

## 2020-04-19 ENCOUNTER — Telehealth (INDEPENDENT_AMBULATORY_CARE_PROVIDER_SITE_OTHER): Payer: Medicare Other | Admitting: Family Medicine

## 2020-04-19 DIAGNOSIS — I255 Ischemic cardiomyopathy: Secondary | ICD-10-CM

## 2020-04-19 DIAGNOSIS — K625 Hemorrhage of anus and rectum: Secondary | ICD-10-CM

## 2020-04-19 DIAGNOSIS — K629 Disease of anus and rectum, unspecified: Secondary | ICD-10-CM | POA: Diagnosis not present

## 2020-04-19 NOTE — Progress Notes (Signed)
Virtual Visit via Video Note  I connected with [place]  on 04/19/20 at  3:00 PM EDT by a video enabled telemedicine application and verified that I am speaking with the correct person using two identifiers.  Location patient: home, [place] Location provider:work or home office Persons participating in the virtual visit: patient, provider  I discussed the limitations of evaluation and management by telemedicine and the availability of in person appointments. The patient expressed understanding and agreed to proceed.   HPI:  Acute visit fo possible "hemorrhoids": -reports intermittent issues for a long time -has never seen a doctor for this -reports intermittent pruritis and  "polyps" externally around the rectum -feels like the tissue is constantly swollen in this area - this swollen tissues has spread up toward the scrotum and is very itchy -minimal blood rarely -no pain or discomfort with BMs - but feels like the "polyps" may be obstructing the stool passage a little -desires referral to GI  ROS: See pertinent positives and negatives per HPI.  Past Medical History:  Diagnosis Date  . CAD S/P PCI to Cx & RCA    a. s/p PCI of LCx (2006) and ostium of RCA (2007) as well as DES to RCA in [place] (05/2014)  . HTN (hypertension)   . Hyperlipidemia     Past Surgical History:  Procedure Laterality Date  . CARDIAC CATHETERIZATION N/A 05/06/2016   Procedure: Left Heart Cath and Coronary Angiography;  Surgeon: Nelva Bush, MD;  Location: [place] [facility];  Service: Cardiovascular;  Laterality: N/A;  . CARDIAC CATHETERIZATION N/A 05/06/2016   Procedure: Coronary Stent Intervention;  Surgeon: Nelva Bush, MD;  Location: [place] [facility];  Service: Cardiovascular;  Laterality: N/A;  . CARDIAC CATHETERIZATION N/A 05/13/2016   Procedure: Coronary/Graft Angiography;  Surgeon: Peter M Martinique, MD;  Location: [place] [facility];  Service: Cardiovascular;  Laterality: N/A;  . CORONARY  ANGIOPLASTY WITH STENT PLACEMENT  2006   in [place], occluded CFX, s/p Taxus stent  . CORONARY ANGIOPLASTY WITH STENT PLACEMENT  2007   in [place], ostial RCA stent  . CORONARY ANGIOPLASTY WITH STENT PLACEMENT  2015   in [place], RCA stent  . TONSILLECTOMY AND ADENOIDECTOMY       Family History  Problem Relation Age of Onset  . Hypertension Father   . Dementia Father        Vascular. patient states dementia/possible alzheimers as well  . Heart disease Mother        died in 68s  . Hyperlipidemia Sister   . Cancer Brother        type unknown  . Cancer Brother        type unknown  . Heart disease Maternal Grandfather 75       Died probably of heart diseasse  . Heart disease Maternal Uncle 63       Died of "heart exploding"  . Cerebral palsy Daughter   . Stroke Son        severe problems with blood clotting    SOCIAL HX: see hpi   Current Outpatient Medications:  .  aspirin 81 MG tablet, Take 81 mg by mouth daily., Disp: , Rfl:  .  atorvastatin (LIPITOR) 80 MG tablet, TAKE 1 TABLET DAILY, Disp: 90 tablet, Rfl: 3 .  BRILINTA 90 MG TABS tablet, TAKE 1 TABLET TWICE A DAY, Disp: 180 tablet, Rfl: 3 .  CHERRY PO, Take 50 mg by mouth daily. Cherry extract, Disp: , Rfl:  .  Cinnamon  500 MG capsule, Take 500 mg by mouth 2 (two) times daily. , Disp: , Rfl:  .  cyclobenzaprine (FLEXERIL) 10 MG tablet, Take 1 tablet (10 mg total) by mouth 2 (two) times daily as needed for muscle spasms., Disp: 20 tablet, Rfl: 0 .  isosorbide mononitrate (IMDUR) 60 MG 24 hr tablet, TAKE 1 TABLET DAILY, Disp: 90 tablet, Rfl: 3 .  nitroGLYCERIN (NITROSTAT) 0.4 MG SL tablet, DISSOLVE 1 TABLET UNDER THE TONGUE EVERY 5 MINUTES AS NEEDED FOR CHEST PAIN UP TO 3 DOSES, Disp: 25 tablet, Rfl: 1 .  Prasterone, DHEA, (DHEA 50 PO), Take 1 capsule by mouth daily. Take for 4 weeks, then off for 2 weeks, Disp: , Rfl:  .  predniSONE (DELTASONE) 10 MG tablet, Take two tablets daily x 1 week, then one tablet  daily x 1 week., Disp: 21 tablet, Rfl: 0 .  tiZANidine (ZANAFLEX) 4 MG tablet, Take 4 mg by mouth at bedtime. , Disp: , Rfl:  .  Turmeric 500 MG CAPS, Take 500 mg by mouth daily. , Disp: , Rfl:  .  Vitamin D, Ergocalciferol, (DRISDOL) 1.25 MG (50000 UNIT) CAPS capsule, TAKE 1 CAPSULE EVERY 7 DAYS, Disp: 4 capsule, Rfl: 0  EXAM:  VITALS per patient if applicable:  GENERAL: alert, oriented, appears well and in no acute distress  HEENT: atraumatic, conjunttiva clear, no obvious abnormalities on inspection of external nose and ears  NECK: normal movements of the head and neck  LUNGS: on inspection no signs of respiratory distress, breathing rate appears normal, no obvious gross SOB, gasping or wheezing  CV: no obvious cyanosis  RECTAL/GU: not performed given virtual visit  MS: moves all visible extremities without noticeable abnormality  PSYCH/NEURO: pleasant and cooperative, no obvious depression or anxiety, speech and thought processing grossly intact  ASSESSMENT AND PLAN:  Discussed the following assessment and plan:  Perianal lesion - Plan: Ambulatory referral to Gastroenterology  BRBPR (bright red blood per rectum) - Plan: Ambulatory referral to Gastroenterology  -we discussed possible serious and likely etiologies, options for evaluation and workup, limitations of telemedicine visit vs in person visit, treatment, treatment risks and precautions. Pt prefers to treat via telemedicine empirically rather then risking or undertaking an in person visit at this moment but requests referral to GI. Referral placed. Trial clotrimazole topical in interim for the pruritic spreading rash he described but advised needs prompt inperson visit to evaluate and dx the polypoid lesions and confirm dx for the rash. Advised if has not heard back regarding the referral in the next several business to call to check on the status of the referral.  Patient agrees to seek prompt in person care if worsening,  new symptoms arise, or if is not improving with treatment.   I discussed the assessment and treatment plan with the patient. The patient was provided an opportunity to ask questions and all were answered. The patient agreed with the plan and demonstrated an understanding of the instructions.   The patient was advised to call back or seek an in-person evaluation if the symptoms worsen or if the condition fails to improve as anticipated.   Lucretia Kern, DO

## 2020-06-08 ENCOUNTER — Ambulatory Visit (INDEPENDENT_AMBULATORY_CARE_PROVIDER_SITE_OTHER): Payer: Medicare Other | Admitting: Gastroenterology

## 2020-06-08 ENCOUNTER — Encounter: Payer: Self-pay | Admitting: Gastroenterology

## 2020-06-08 VITALS — BP 130/64 | HR 72 | Ht 67.0 in | Wt 200.4 lb

## 2020-06-08 DIAGNOSIS — I255 Ischemic cardiomyopathy: Secondary | ICD-10-CM | POA: Diagnosis not present

## 2020-06-08 DIAGNOSIS — L290 Pruritus ani: Secondary | ICD-10-CM | POA: Insufficient documentation

## 2020-06-08 MED ORDER — HYDROCORTISONE (PERIANAL) 2.5 % EX CREA: 1.0000 "application " | TOPICAL_CREAM | Freq: Two times a day (BID) | CUTANEOUS | 1 refills | Status: DC

## 2020-06-08 NOTE — Progress Notes (Signed)
06/08/2020 Michael [name] [id number] 07/23/1956   HISTORY OF PRESENT ILLNESS: This is a 64 year old male is a patient of Dr. Silvio Pate.  His last colonoscopy was in May 2019 at which time he had some polyps removed.  Repeat was recommended a 5-year interval.  He is here today with complaints of perianal itching and irritation.  He says that this has been present for the past 2 years or more but it seems to be worse recently.  He says it definitely is worse after being more active.  No change in bowel habits with constipation or diarrhea.  He says that he sees very small amounts of bright red blood on the toilet paper at times, but nothing in the stool or the toilet bowl.  Uses Preparation H but it does not seem to help.   Past Medical History:  Diagnosis Date  . CAD S/P PCI to Cx & RCA    a. s/p PCI of LCx (2006) and ostium of RCA (2007) as well as DES to RCA in [place] (05/2014)  . HTN (hypertension)   . Hyperlipidemia    Past Surgical History:  Procedure Laterality Date  . CARDIAC CATHETERIZATION N/A 05/06/2016   Procedure: Left Heart Cath and Coronary Angiography;  Surgeon: Nelva Bush, MD;  Location: [facility];  Service: Cardiovascular;  Laterality: N/A;  . CARDIAC CATHETERIZATION N/A 05/06/2016   Procedure: Coronary Stent Intervention;  Surgeon: Nelva Bush, MD;  Location: [facility];  Service: Cardiovascular;  Laterality: N/A;  . CARDIAC CATHETERIZATION N/A 05/13/2016   Procedure: Coronary/Graft Angiography;  Surgeon: Peter M Martinique, MD;  Location: [facility];  Service: Cardiovascular;  Laterality: N/A;  . CORONARY ANGIOPLASTY WITH STENT PLACEMENT  2006   in [place], occluded CFX, s/p Taxus stent  . CORONARY ANGIOPLASTY WITH STENT PLACEMENT  2007   in [place], ostial RCA stent  . CORONARY ANGIOPLASTY WITH STENT PLACEMENT  2015   in [place], RCA stent  . TONSILLECTOMY AND ADENOIDECTOMY       reports that he has never smoked. He  has never used smokeless tobacco. He reports that he does not drink alcohol and does not use drugs. family history includes Cancer in his brother and brother; Cerebral palsy in his daughter; Dementia in his father; Heart disease (age of onset: 23) in his maternal uncle; Heart disease (age of onset: 81) in his maternal grandfather; Hyperlipidemia in his sister; Hypertension in his father; Stroke in his son. No Known Allergies    Outpatient Encounter Medications as of 06/08/2020  Medication Sig  . aspirin 81 MG tablet Take 81 mg by mouth daily.  [name] atorvastatin (LIPITOR) 80 MG tablet TAKE 1 TABLET DAILY  . BRILINTA 90 MG TABS tablet TAKE 1 TABLET TWICE A DAY  . CHERRY PO Take 50 mg by mouth daily. [place] extract  . Cinnamon 500 MG capsule Take 500 mg by mouth 2 (two) times daily.   . cyclobenzaprine (FLEXERIL) 10 MG tablet Take 1 tablet (10 mg total) by mouth 2 (two) times daily as needed for muscle spasms.  . isosorbide mononitrate (IMDUR) 60 MG 24 hr tablet TAKE 1 TABLET DAILY  . nitroGLYCERIN (NITROSTAT) 0.4 MG SL tablet DISSOLVE 1 TABLET UNDER THE TONGUE EVERY 5 MINUTES AS NEEDED FOR CHEST PAIN UP TO 3 DOSES  . Prasterone, DHEA, (DHEA 50 PO) Take 1 capsule by mouth daily. Take for 4 weeks, then off for 2 weeks  . predniSONE (DELTASONE) 10 MG tablet Take  two tablets daily x 1 week, then one tablet daily x 1 week.  [name] tiZANidine (ZANAFLEX) 4 MG tablet Take 4 mg by mouth at bedtime.   . Turmeric 500 MG CAPS Take 500 mg by mouth daily.   . Vitamin D, Ergocalciferol, (DRISDOL) 1.25 MG (50000 UNIT) CAPS capsule TAKE 1 CAPSULE EVERY 7 DAYS   No facility-administered encounter medications on file as of 06/08/2020.     REVIEW OF SYSTEMS  : All other systems reviewed and negative except where noted in the History of Present Illness.   PHYSICAL EXAM: BP 130/64   Pulse 72   Ht 5\' 7"  (1.702 m)   Wt 200 lb 6.4 oz (90.9 kg)   BMI 31.39 kg/m  General: Well developed white male in no acute  distress Head: Normocephalic and atraumatic Eyes:  Sclerae anicteric, conjunctiva pink. Ears: Normal auditory acuity Rectal:  Small to medium sized external hemorrhoids noted, non-inflamed.  There was some irritation in the perineum with some small erosions that looked to be from trauma it scratching, etc.  Not much erythema.  DRE did not reveal any masses.  Light brown stool on exam glove. Musculoskeletal: Symmetrical with no gross deformities  Skin: No lesions on visible extremities Extremities: No edema  Neurological: Alert oriented x 4, grossly non-focal Psychological:  Alert and cooperative. Normal mood and affect  ASSESSMENT AND PLAN: *Perianal itching and irritation: He does have some small to medium sized external hemorrhoids, but they are not inflamed.  He has irritation and some erosions in the perineum that appear likely to be from trauma such as scratching and aggressive rubbing of the area.  I suspect that all of this is possibly due to moisture in the area causing some skin breakdown to a degree.  Does not appear to be fungal or yeast.  We will try hydrocortisone 2.5% applied to the area twice daily for the next couple of weeks.  Prescription sent to pharmacy.  He will call us back with an update.  Following that he may want to use Desitin/zinc oxide as a barrier try to protect the area.   CC:  [name], MD

## 2020-06-08 NOTE — Patient Instructions (Signed)
If you are age 65 or older, your body mass index should be between 23-30. Your Body mass index is 31.39 kg/m. If this is out of the aforementioned range listed, please consider follow up with your Primary Care Provider.  If you are age 45 or younger, your body mass index should be between 19-25. Your Body mass index is 31.39 kg/m. If this is out of the aformentioned range listed, please consider follow up with your Primary Care Provider.   We have sent the following medications to your pharmacy for you to pick up at your convenience: Hydrocortisone cream 2.5% twice daily for 2 weeks.  After Hydrocortisone cream apply [place] twice daily to affected areas.  Call back in 2 weeks with an update, ask for Koren Shiver, RN.

## 2020-06-11 NOTE — Progress Notes (Signed)
Reviewed and agree with management plan.  Glenden Rossell T. Kailie Polus, MD FACG [facility]  

## 2020-06-18 ENCOUNTER — Ambulatory Visit: Payer: TRICARE For Life (TFL) | Admitting: Gastroenterology

## 2020-06-27 ENCOUNTER — Encounter: Payer: Self-pay | Admitting: Family Medicine

## 2020-06-27 ENCOUNTER — Ambulatory Visit (INDEPENDENT_AMBULATORY_CARE_PROVIDER_SITE_OTHER): Payer: Medicare Other | Admitting: Family Medicine

## 2020-06-27 ENCOUNTER — Other Ambulatory Visit: Payer: Self-pay

## 2020-06-27 VITALS — BP 116/82 | HR 71 | Ht 67.0 in | Wt 202.0 lb

## 2020-06-27 DIAGNOSIS — M7918 Myalgia, other site: Secondary | ICD-10-CM | POA: Diagnosis not present

## 2020-06-27 DIAGNOSIS — M999 Biomechanical lesion, unspecified: Secondary | ICD-10-CM

## 2020-06-27 DIAGNOSIS — I255 Ischemic cardiomyopathy: Secondary | ICD-10-CM

## 2020-06-27 NOTE — Progress Notes (Signed)
Michael [name] Michael [name] Michael [name] Phone: [phone] Subjective:   Michael [name], am serving as a scribe for Dr. Hulan [name]. This visit occurred during the SARS-CoV-2 public health emergency.  Safety protocols were in place, including screening questions prior to the visit, additional usage of staff PPE, and extensive cleaning of exam room while observing appropriate contact time as indicated for disinfecting solutions.   I'm seeing this patient by the request  of:  Michael Olp, MD  CC: Neck and back pain follow-up  [id number]  Michael [name] is a 64 y.o. male coming in with complaint of back and neck pain. OMT 04/18/2020. Patient states that he has some mild tightness.  Patient knows if he does the exercises he seems to do relatively better though.  Has not needed the muscle relaxer significantly.  Medications patient has been prescribed: Vitamin D  Taking:         Reviewed prior external information including notes and imaging from previsou exam, outside providers and external EMR if available.   As well as notes that were available from care everywhere and other healthcare systems.  Past medical history, social, surgical and family history all reviewed in electronic medical record.  [name] pertanent information unless stated regarding to the chief complaint.   Past Medical History:  Diagnosis Date  . CAD S/P PCI to Cx & RCA    a. s/p PCI of LCx (2006) and ostium of RCA (2007) as well as DES to RCA in [place] (05/2014)  . HTN (hypertension)   . Hyperlipidemia     [name] Known Allergies   Review of Systems:  [name] headache, visual changes, nausea, vomiting, diarrhea, constipation, dizziness, abdominal pain, skin rash, fevers, chills, night sweats, weight loss, swollen lymph nodes, body aches, joint swelling, chest pain, shortness of breath, mood changes. POSITIVE muscle aches  Objective  There were [name] vitals taken  for this visit.   General: [name] apparent distress alert and oriented x3 mood and affect normal, dressed appropriately.  HEENT: Pupils equal, extraocular movements intact  Respiratory: Patient's speak in full sentences and does not appear short of breath  Cardiovascular: [name] lower extremity edema, non tender, [name] erythema  Neuro: Cranial nerves II through XII are intact, neurovascularly intact in all extremities with 2+ DTRs and 2+ pulses.  Gait normal with good balance and coordination.  MSK:  Non tender with full range of motion and good stability and symmetric strength and tone of shoulders, elbows, wrist, hip, knee and ankles bilaterally.  Back -low back exam does have some tightness noted of the paraspinal musculature of the lumbar spine.  Seems to be more in the thoracolumbar junction.  He does have some mild over the lumbosacral as well.  Osteopathic findings   T11 extended rotated and side bent left L1 flexed rotated and side bent right Sacrum right on right       Assessment and Plan:  Gluteal pain Gluteal pain secondary to some radicular symptoms.  Responding well to osteopathic nucleation.  Avoiding all the anti-inflammatories.  Patient is doing well with Celexa intermittently.  Encouraged to continue work on core stabilization.  Follow-up again in 3 months   Nonallopathic problems  Decision today to treat with OMT was based on Physical Exam  After verbal consent patient was treated with HVLA, ME, FPR techniques in  thoracic, lumbar, and sacral  areas  Patient tolerated the procedure well with improvement in symptoms  Patient given  exercises, stretches and lifestyle modifications  See medications in patient instructions if given  Patient will follow up in 4-8 weeks      The above documentation has been reviewed and is accurate and complete Michael Pulley, DO       Note: This dictation was prepared with Dragon dictation along with smaller phrase technology. Any  transcriptional errors that result from this process are unintentional.

## 2020-06-27 NOTE — Patient Instructions (Signed)
Careful with the cookies  Keep up everything you are doing See me again in 10-12 weeks

## 2020-06-27 NOTE — Assessment & Plan Note (Signed)
Gluteal pain secondary to some radicular symptoms.  Responding well to osteopathic nucleation.  Avoiding all the anti-inflammatories.  Patient is doing well with Celexa intermittently.  Encouraged to continue work on core stabilization.  Follow-up again in 3 months

## 2020-08-01 NOTE — Patient Instructions (Addendum)
Please stop by lab before you go If you have mychart- we will send your results within 3 business days of [place] receiving them.  If you do not have mychart- we will call you about results within 5 business days of [place] receiving them.  *please note we are currently using [facility] which has a longer processing time than [facility] typically so labs may not come back as quickly as in the past *please also note that you will see labs on mychart as soon as they post. I will later go in and write notes on them- will say "notes from Dr. Yong Channel"  Health Maintenance Due  Topic Date Due  . COVID-19 Vaccine (1) Declined the vaccine Never done  . PNA vac Low Risk Adult - pneumovax 23 today Never done  . INFLUENZA VACCINE In office flu shot today 04/22/2020   Team please complete vision screen  Team give rotator cuff exercise handout. Want him to do these 3x a week for a month then once weekly. Stop any exercise that causes more than 1-2/10 pain increase.   Could try cock up wrist splint at night- this will sometime help with daytime symptoms if you have carpal tunnel. Also checking som elabs and want you to follow up with Dr. Tamala Julian if not improving- may need further workup.   Michael [name] , Thank you for taking time to come for your Medicare Wellness Visit. I appreciate your ongoing commitment to your health goals. Please review the following plan we discussed and let me know if I can assist you in the future.   These are the goals we discussed: 1. Keep up exercise 150 minutes a week   This is a list of the screening recommended for you and due dates:  Health Maintenance  Topic Date Due  . COVID-19 Vaccine (1) Never done  . Pneumonia vaccines (1 of 2 - PCV13) Never done  . Flu Shot  04/22/2020  . Colon Cancer Screening  01/21/2023  . Tetanus Vaccine  12/09/2027  .  Hepatitis C: One time screening is recommended by Center for Disease Control  (CDC) for  adults born from [birth year] through 1966.   Completed   . HIV Screening  Completed

## 2020-08-01 NOTE — Progress Notes (Addendum)
Phone: [phone]   Subjective:  Patient presents today for their Welcome to Medicare Exam    Preventive Screening-Counseling & Management  Vision screen:   Hearing Screening   125Hz  250Hz  500Hz  1000Hz  2000Hz  3000Hz  4000Hz  6000Hz  8000Hz   Right ear:           Left ear:             Visual Acuity Screening   Right eye Left eye Both eyes  Without correction:     With correction: 20/30 20/25     Advanced directives: full code. Daughter HCPOA [name], wife secondary.  Encouraged to bring by copy  Modifiable Risk Factors/behavioral risk assessment/psychosocial risk assessment Exercise- walking 2 days a week or so- about 5 miles each time, considering getting into swimming- recommended 150 minutes a week he is at that. Right groin pain pulled him out of pilates. Diet-weight up 6 lbs from last year- admits to not being disciplined in his diet.  Wt Readings from Last 3 Encounters:  08/03/20 202 lb (91.6 kg)  06/27/20 202 lb (91.6 kg)  06/08/20 200 lb 6.4 oz (90.9 kg)  Smoking Status: Never Smoker Second Hand Smoking status: No smokers in home Alcohol intake: 0 per week  Cardiac risk factors:  advanced age (older than 25 for men, 54 for women)  Known treated Hyperlipidemia  Treated Hypertension  No diabetes. Trying to prevent Lab Results  Component Value Date   HGBA1C 6.0 09/14/2019  Family History: MGF and maternal uncle   Depression Screen/risk evaluation Risk factors: none.[name] PHQ2 0  Depression screen [facility] 2/9 09/14/2019 03/15/2019 09/29/2018 12/08/2017 08/09/2014  Decreased Interest 0 0 0 0 0  Down, Depressed, Hopeless 0 0 0 0 0  PHQ - 2 Score 0 0 0 0 0    Functional ability and level of safety Mobility assessment:  timed get up and go <12 seconds Activities of Daily Living- Independent in ADLs (toileting, bathing, dressing, transferring, eating) and in IADLs (shopping, housekeeping, managing own medications, and handling finances) Home Safety: Loose rugs (no),  smoke detectors (up to date), small pets (no), grab bars (yes), stairs (none), life-alert system (cell phone) Hearing Difficulties: -patient declines Fall Risk: None  Fall Risk  08/03/2020 12/08/2017  Falls in the past year? 0 No  Number falls in past yr: 0 -  Injury with Fall? 0 -   Opioid use history:  no long term opioids use Self assessment of health status: "good"  Required Immunizations needed today:  not planning on doing covid 19 vaccinations - family has been vaccinated- he is hesitant and is going to continue to think about it. Flu shot and pneumovax 23 today Immunization History  Administered Date(s) Administered  . Fluad Quad(high Dose 65+) 08/03/2020  . Influenza,inj,Quad PF,6+ Mos 06/07/2018, 09/14/2019  . Influenza-Unspecified 06/26/2014  . Pneumococcal Polysaccharide-23 08/03/2020  . Td 12/08/2017   Health Maintenance  Topic Date Due  . COVID-19 Vaccine (1) Never done  . Pneumonia vaccines (2 of 2 - PCV13) 08/03/2021  . Colon Cancer Screening  01/21/2023  . Tetanus Vaccine  12/09/2027  . Flu Shot  Completed  .  Hepatitis C: One time screening is recommended by Center for Disease Control  (CDC) for  adults born from [birth year] through 1966.   Completed  . HIV Screening  Completed    Screening tests-  1. Colon cancer screening- 01/2018 with adenomatous polyp and plan for 5 year follow up. Had visit for perinal itching- hyrocortisone and desitin elped.  2.  Lung Cancer screening- not a candidate 3. Skin cancer screening- he plans to call for a new visit. advised regular sunscreen use. Denies worrisome, changing, or new skin lesions.  4. Prostate cancer screening-  Low risk prior prostate cancer trend for psa- update with labs today . Nocturia once a night for most part. Incomplete voiding. Trend PSA. Saw palmetto not effective in the past Lab Results  Component Value Date   PSA 1.25 03/15/2019   PSA 1.53 12/08/2017   The following were reviewed and entered/updated in  epic: Past Medical History:  Diagnosis Date  . CAD S/P PCI to Cx & RCA    a. s/p PCI of LCx (2006) and ostium of RCA (2007) as well as DES to RCA in [place] (05/2014)  . HTN (hypertension)   . Hyperlipidemia    Patient Active Problem List   Diagnosis Date Noted  . CAD S/P multiple PCIs 09/08/2014    Priority: High  . History of adenomatous polyp of colon 06/07/2018    Priority: Medium  . BPH (benign prostatic hyperplasia) 06/07/2018    Priority: Medium  . Impaired glucose tolerance 11/03/2016    Priority: Medium  . HTN (hypertension)     Priority: Medium  . Dyslipidemia 09/08/2014    Priority: Medium  . Vitiligo 06/07/2018    Priority: Low  . Gluteal pain 04/10/2018    Priority: Low  . Plantar fasciitis 10/12/2017    Priority: Low  . Back pain 04/13/2017    Priority: Low  . Nonallopathic lesion of thoracic region 04/13/2017    Priority: Low  . Nonallopathic lesion of sacral region 04/13/2017    Priority: Low  . Nonallopathic lesion of lumbosacral region 04/13/2017    Priority: Low  . Obesity     Priority: Low  . Perianal irritation 06/08/2020  . Ischemic cardiomyopathy 12/05/2019  . Educated about COVID-19 virus infection 12/05/2019   Past Surgical History:  Procedure Laterality Date  . CARDIAC CATHETERIZATION N/A 05/06/2016   Procedure: Left Heart Cath and Coronary Angiography;  Surgeon: Nelva Bush, MD;  Location: [place] [facility];  Service: Cardiovascular;  Laterality: N/A;  . CARDIAC CATHETERIZATION N/A 05/06/2016   Procedure: Coronary Stent Intervention;  Surgeon: Nelva Bush, MD;  Location: [place] [facility];  Service: Cardiovascular;  Laterality: N/A;  . CARDIAC CATHETERIZATION N/A 05/13/2016   Procedure: Coronary/Graft Angiography;  Surgeon: Peter M Martinique, MD;  Location: [place] [facility];  Service: Cardiovascular;  Laterality: N/A;  . CORONARY ANGIOPLASTY WITH STENT PLACEMENT  2006   in [place], occluded CFX, s/p Taxus stent  . CORONARY  ANGIOPLASTY WITH STENT PLACEMENT  2007   in [place], ostial RCA stent  . CORONARY ANGIOPLASTY WITH STENT PLACEMENT  2015   in [place], RCA stent  . TONSILLECTOMY AND ADENOIDECTOMY       Family History  Problem Relation Age of Onset  . Hypertension Father   . Dementia Father        Vascular. patient states dementia/possible alzheimers as well  . Hyperlipidemia Sister   . Cancer Brother        type unknown  . Cancer Brother        type unknown  . Heart disease Maternal Grandfather 61       Died probably of heart diseasse  . Heart disease Maternal Uncle 77       Died of "heart exploding"  . Cerebral palsy Daughter   . Stroke Son        severe  problems with blood clotting  . Colon cancer Neg Hx   . Esophageal cancer Neg Hx   . Pancreatic cancer Neg Hx   . Stomach cancer Neg Hx   . Liver disease Neg Hx     Medications- reviewed and updated Current Outpatient Medications  Medication Sig Dispense Refill  . aspirin 81 MG tablet Take 81 mg by mouth daily.    [name] atorvastatin (LIPITOR) 80 MG tablet TAKE 1 TABLET DAILY 90 tablet 3  . BRILINTA 90 MG TABS tablet TAKE 1 TABLET TWICE A DAY 180 tablet 3  . CHERRY PO Take 50 mg by mouth daily. Iliamna extract    . Cinnamon 500 MG capsule Take 500 mg by mouth 2 (two) times daily.     . cyclobenzaprine (FLEXERIL) 10 MG tablet Take 1 tablet (10 mg total) by mouth 2 (two) times daily as needed for muscle spasms. 20 tablet 0  . hydrocortisone (ANUSOL-HC) 2.5 % rectal cream Place 1 application rectally 2 (two) times daily. 30 g 1  . isosorbide mononitrate (IMDUR) 60 MG 24 hr tablet TAKE 1 TABLET DAILY 90 tablet 3  . nitroGLYCERIN (NITROSTAT) 0.4 MG SL tablet DISSOLVE 1 TABLET UNDER THE TONGUE EVERY 5 MINUTES AS NEEDED FOR CHEST PAIN UP TO 3 DOSES 25 tablet 1  . Prasterone, DHEA, (DHEA 50 PO) Take 1 capsule by mouth daily. Take for 4 weeks, then off for 2 weeks    . Turmeric 500 MG CAPS Take 500 mg by mouth daily.     . Vitamin D,  Ergocalciferol, (DRISDOL) 1.25 MG (50000 UNIT) CAPS capsule TAKE 1 CAPSULE EVERY 7 DAYS 4 capsule 0   No current facility-administered medications for this visit.    Allergies-reviewed and updated No Known Allergies  Social History   Socioeconomic History  . Marital status: Married    Spouse name: Not on file  . Number of children: 4  . Years of education: Not on file  . Highest education level: Not on file  Occupational History  . Occupation: retired  Tobacco Use  . Smoking status: Never Smoker  . Smokeless tobacco: Never Used  Vaping Use  . Vaping Use: Never used  Substance and Sexual Activity  . Alcohol use: No    Alcohol/week: 0.0 standard drinks  . Drug use: No  . Sexual activity: Yes  Other Topics Concern  . Not on file  Social History Narrative   Lives with wife.  4 kids total (2 step, 2 biological). 1 that has CP. Daughter [name]- comes to [facility]        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 Difficulty of Paying Living Expenses: Not on file  Food Insecurity:   . Worried About Charity fundraiser in the Last Year: Not on file  . Ran Out of Food in the Last Year: Not on file  Transportation Needs:   . Lack of Transportation (Medical): Not on file  . Lack of Transportation (Non-Medical): Not on file  Physical Activity:   . Days of Exercise per Week: Not on file  . Minutes of Exercise per Session: Not on file  Stress:   . Feeling of Stress : Not on file  Social Connections:   . Frequency of Communication with Friends and Family: Not on file  . Frequency of Social Gatherings with  Friends and Family: Not on file  . Attends Religious Services: Not on file  . Active Member of Clubs or Organizations: Not on file  . Attends Archivist Meetings: Not on file  . Marital Status: Not on file   Objective   Objective:  BP 124/62   Pulse 82   Temp 98.3 F (36.8 C) (Temporal)   Resp 18   Ht 5\' 7"  (1.702 m)   Wt 202 lb (91.6 kg)   SpO2 98%   BMI 31.64 kg/m  Gen: NAD, resting comfortably HEENT: Mucous membranes are moist. Oropharynx normal Neck: no thyromegaly CV: RRR no murmurs rubs or gallops Lungs: CTAB no crackles, wheeze, rhonchi Abdomen: soft/nontender/nondistended/normal bowel sounds. No rebound or guarding.  Ext: trace to 1+ edema Skin: warm, dry Neuro: grossly normal, moves all extremities, PERRLA   Assessment and Plan:   Welcome to Medicare exam completed-  1. Educated, counseled and referred based on above elements 2. Educated, counseled and referred as appropriate for preventative needs 3. Discussed and documented a written plan for preventiative services and screenings with personalized health advice- After Visit Summary was given to patient which included this plan  4. EKG not indicated- gets these with cardiology  Status of chronic or acute concerns   #hypertension S: medication: Imdur 60Mg  BP Readings from Last 3 Encounters:  08/03/20 124/62  06/27/20 116/82  06/08/20 130/64  A/P:  Stable. Continue current medications.   # CAD with angina-  #hyperlipidemia S: Medication: atorvastatin 80Mg , aspirin 81 mg, brilinta 90 mg , imdur 60mg . Nitroglycerin has not had to use.   Asymptomatic while on imdur for most part- if working hard may get some fatigue but no chest pain or shortness of breath Lab Results  Component Value Date   CHOL 108 12/17/2018   HDL 43 12/17/2018   LDLCALC 48 12/17/2018   LDLDIRECT 62.0 09/14/2019   TRIG 85 12/17/2018   CHOLHDL 2.5 12/17/2018   A/P: CAD with angina stable- continue current medications. HLD hopefully still at goal- update lipid panel  # Hyperglycemia/insulin resistance/prediabetes S:  Medication: none Exercise and diet- would want to work on lifestyle unless significant increase.  Lab Results  Component Value Date    HGBA1C 6.0 09/14/2019   HGBA1C 5.8 (H) 12/16/2018   HGBA1C 6.1 09/29/2018   A/P: hopefully stable- update a1c with labs  # paresthesias- a few weeks ago noted some tinging in 1st and 2nd fingers perhaps 3 weeks ago then got better .then has noted if he uses hands gets some soreness in his fingers. Moves joints fine. Also notes some shoulder pain- perhaps mild right stiffness. Right shoulder seems to have some rotator cuff injury - hand out given. May have some nerve irritation- check b12 and tsh . Follow up with Dr. Tamala Julian if not improving.   #Right hip pain- thinks could have some arthritis- hurts more in lateral area and into back. has noted some intermittent soreness in right groin- no obvious hernia. Would recommend follow up with dr. Tamala Julian. May have some hip and low back arthritis  Recommended follow up: Return in about 6 months (around 01/31/2021) for follow up- or sooner if needed. Future Appointments  Date Time Provider [place]  09/05/2020  8:30 AM [name], DO [facility] None  02/05/2021  8:00 AM Yong Channel Brayton Mars, MD [facility]   Lab/Order associations:non fasting   ICD-10-CM   1. Preventative health care  T26.71 COMPLETE METABOLIC PANEL WITH GFR    CBC with  Differential/Platelet    Lipid panel    Hemoglobin A1c    PSA    PSA    Hemoglobin A1c    Lipid panel    CBC with Differential/Platelet    COMPLETE METABOLIC PANEL WITH GFR  2. Primary hypertension  U02 COMPLETE METABOLIC PANEL WITH GFR    CBC with Differential/Platelet    Lipid panel    Lipid panel    CBC with Differential/Platelet    COMPLETE METABOLIC PANEL WITH GFR  3. Dyslipidemia  B34.3 COMPLETE METABOLIC PANEL WITH GFR    CBC with Differential/Platelet    Lipid panel    Lipid panel    CBC with Differential/Platelet    COMPLETE METABOLIC PANEL WITH GFR  4. BPH associated with nocturia  N40.1 PSA   R35.1 PSA  5. Hyperglycemia  R73.9 Hemoglobin A1c    Hemoglobin A1c  6. Paresthesias  R20.2  TSH    Vitamin B12    Vitamin B12    TSH  7. Need for immunization against influenza  Z23 Flu Vaccine QUAD High Dose(Fluad)    No orders of the defined types were placed in this encounter.   Return precautions advised.  Garret Reddish, MD

## 2020-08-03 ENCOUNTER — Other Ambulatory Visit: Payer: Self-pay

## 2020-08-03 ENCOUNTER — Encounter: Payer: Self-pay | Admitting: Family Medicine

## 2020-08-03 ENCOUNTER — Ambulatory Visit (INDEPENDENT_AMBULATORY_CARE_PROVIDER_SITE_OTHER): Payer: Medicare Other | Admitting: Family Medicine

## 2020-08-03 VITALS — BP 124/62 | HR 82 | Temp 98.3°F | Resp 18 | Ht 67.0 in | Wt 202.0 lb

## 2020-08-03 DIAGNOSIS — R202 Paresthesia of skin: Secondary | ICD-10-CM

## 2020-08-03 DIAGNOSIS — Z23 Encounter for immunization: Secondary | ICD-10-CM

## 2020-08-03 DIAGNOSIS — N401 Enlarged prostate with lower urinary tract symptoms: Secondary | ICD-10-CM

## 2020-08-03 DIAGNOSIS — R739 Hyperglycemia, unspecified: Secondary | ICD-10-CM

## 2020-08-03 DIAGNOSIS — Z0000 Encounter for general adult medical examination without abnormal findings: Secondary | ICD-10-CM | POA: Diagnosis not present

## 2020-08-03 DIAGNOSIS — E785 Hyperlipidemia, unspecified: Secondary | ICD-10-CM | POA: Diagnosis not present

## 2020-08-03 DIAGNOSIS — I10 Essential (primary) hypertension: Secondary | ICD-10-CM

## 2020-08-03 DIAGNOSIS — R351 Nocturia: Secondary | ICD-10-CM

## 2020-08-03 LAB — VITAMIN B12: Vitamin B-12: 194 pg/mL — ABNORMAL LOW (ref 200–1100)

## 2020-08-03 LAB — TSH: TSH: 2.14 mIU/L (ref 0.40–4.50)

## 2020-08-04 LAB — COMPLETE METABOLIC PANEL WITH GFR
AG Ratio: 1.9 (calc) (ref 1.0–2.5)
ALT: 29 U/L (ref 9–46)
AST: 24 U/L (ref 10–35)
Albumin: 4.1 g/dL (ref 3.6–5.1)
Alkaline phosphatase (APISO): 78 U/L (ref 35–144)
BUN: 20 mg/dL (ref 7–25)
CO2: 28 mmol/L (ref 20–32)
Calcium: 9.5 mg/dL (ref 8.6–10.3)
Chloride: 103 mmol/L (ref 98–110)
Creat: 0.96 mg/dL (ref 0.70–1.25)
GFR, Est African American: 96 mL/min/{1.73_m2} (ref >=60)
GFR, Est Non African American: 83 mL/min/{1.73_m2} (ref >=60)
Globulin: 2.2 g/dL (calc) (ref 1.9–3.7)
Glucose, Bld: 95 mg/dL (ref 65–99)
Potassium: 4.1 mmol/L (ref 3.5–5.3)
Sodium: 138 mmol/L (ref 135–146)
Total Bilirubin: 1.3 mg/dL — ABNORMAL HIGH (ref 0.2–1.2)
Total Protein: 6.3 g/dL (ref 6.1–8.1)

## 2020-08-04 LAB — CBC WITH DIFFERENTIAL/PLATELET
Absolute Monocytes: 531 cells/uL (ref 200–950)
Basophils Absolute: 31 cells/uL (ref 0–200)
Basophils Relative: 0.5 %
Eosinophils Absolute: 79 cells/uL (ref 15–500)
Eosinophils Relative: 1.3 %
HCT: 40.1 % (ref 38.5–50.0)
Hemoglobin: 14.5 g/dL (ref 13.2–17.1)
Lymphs Abs: 1269 cells/uL (ref 850–3900)
MCH: 34.4 pg — ABNORMAL HIGH (ref 27.0–33.0)
MCHC: 36.2 g/dL — ABNORMAL HIGH (ref 32.0–36.0)
MCV: 95.2 fL (ref 80.0–100.0)
MPV: 9.6 fL (ref 7.5–12.5)
Monocytes Relative: 8.7 %
Neutro Abs: 4191 cells/uL (ref 1500–7800)
Neutrophils Relative %: 68.7 %
Platelets: 240 10*3/uL (ref 140–400)
RBC: 4.21 10*6/uL (ref 4.20–5.80)
RDW: 12.7 % (ref 11.0–15.0)
Total Lymphocyte: 20.8 %
WBC: 6.1 10*3/uL (ref 3.8–10.8)

## 2020-08-04 LAB — LIPID PANEL
Cholesterol: 117 mg/dL (ref <200)
HDL: 43 mg/dL (ref >=40)
LDL Cholesterol (Calc): 54 mg/dL (calc)
Non-HDL Cholesterol (Calc): 74 mg/dL (calc) (ref <130)
Total CHOL/HDL Ratio: 2.7 (calc) (ref <5.0)
Triglycerides: 117 mg/dL (ref <150)

## 2020-08-04 LAB — HEMOGLOBIN A1C
Hgb A1c MFr Bld: 6 % of total Hgb — ABNORMAL HIGH (ref <5.7)
Mean Plasma Glucose: 126 (calc)
eAG (mmol/L): 7 (calc)

## 2020-08-04 LAB — PSA: PSA: 1.37 ng/mL (ref <=4.0)

## 2020-08-07 ENCOUNTER — Encounter: Payer: Self-pay | Admitting: Family Medicine

## 2020-08-07 ENCOUNTER — Telehealth: Payer: Self-pay

## 2020-08-07 MED ORDER — CYANOCOBALAMIN 1000 MCG/ML IJ SOLN: INTRAMUSCULAR | 3 refills | Status: DC

## 2020-08-07 NOTE — Telephone Encounter (Signed)
Returned pt call and he would like to give himself the b12 injections, Rx sent in.

## 2020-08-07 NOTE — Telephone Encounter (Signed)
Patient returned phone call regarding injections

## 2020-09-04 NOTE — Progress Notes (Signed)
[place] [address] [place] Phone: [phone] Subjective:   I Michael [name] am serving as a Education administrator for Dr. Hulan Saas.  This visit occurred during the SARS-CoV-2 public health emergency.  Safety protocols were in place, including screening questions prior to the visit, additional usage of staff PPE, and extensive cleaning of exam room while observing appropriate contact time as indicated for disinfecting solutions.   I'm seeing this patient by the request  of:  [name], MD  CC: Low back pain, neck pain follow-up and arm pain  JAS:[id number]  Michael [name] is a 64 y.o. male coming in with complaint of back and neck pain. OMT 06/27/2020. Patient states he is sore and states that after exercises it takes him a long time to recover. Right hand pain. Depending on how he uses his hand depends on which finger hurts. 1st finger pain and pinky he has pain. Mostly the 1st, 2nd and pinky finger are painful. Negative for carpal tunnel. Believes he has a nerve issue. Right hand dominant. States he is afraid to grip things due to pain. Chronic issue about 6 weeks. States the thumb and first finger feels like pin pricks. Sore in the morning. Believes it may be neuropathy.   Medications patient has been prescribed: Vit D          Reviewed prior external information including notes and imaging from previsou exam, outside providers and external EMR if available.   As well as notes that were available from care everywhere and other healthcare systems.  Past medical history, social, surgical and family history all reviewed in electronic medical record.  No pertanent information unless stated regarding to the chief complaint.   Past Medical History:  Diagnosis Date  . CAD S/P PCI to Cx & RCA    a. s/p PCI of LCx (2006) and ostium of RCA (2007) as well as DES to RCA in [place] (05/2014)  . HTN (hypertension)   . Hyperlipidemia      No Known Allergies   Review of Systems:  No headache, visual changes, nausea, vomiting, diarrhea, constipation, dizziness, abdominal pain, skin rash, fevers, chills, night sweats, weight loss, swollen lymph nodes, body aches, joint swelling, chest pain, shortness of breath, mood changes. POSITIVE muscle aches  Objective  Blood pressure 130/90, pulse (!) 113, height 5\' 7"  (1.702 m), weight 201 lb (91.2 kg), SpO2 98 %.   General: No apparent distress alert and oriented x3 mood and affect normal, dressed appropriately.  HEENT: Pupils equal, extraocular movements intact  Respiratory: Patient's speak in full sentences and does not appear short of breath  Cardiovascular: No lower extremity edema, non tender, no erythema  Neuro: Cranial nerves II through XII are intact, neurovascularly intact in all extremities with 2+ DTRs and 2+ pulses.  Gait normal with good balance and coordination.   Back -low back exam does have some mild loss of lordosis.  Tightness in the thoracolumbar junction.  Mild tightness around the sacroiliac joint as well. Neck exam does have some loss of lordosis.  Negative Spurling's though. Wrist exam on the right side shows the patient does have a mild positive Tinel's.  Good grip strength.  No weakness noted otherwise.  Full range of motion of the wrist   Osteopathic findings   T9 extended rotated and side bent left L2 flexed rotated and side bent right Sacrum right on right  Limited musculoskeletal ultrasound was performed and interpreted by [name]  [name]  Limited ultrasound of patient's right wrist shows that patient does have some hypoechoic changes above the retinaculum of the median nerve.  The median nerve does have mild swelling within the tendon sheath but no true enlargement of the area of the nerve.  Otherwise no significant other abnormalities noted. Impression: Mild carpal tunnel     Assessment and Plan:  Back pain Chronic but stable.  We will get  repeat x-rays.  Patient would like to know because of secondary to some mild increase in gluteal pain.  No true radicular symptoms.  Patient does respond well to manipulation.  Follow-up again in 6 to 8 weeks  Right carpal tunnel syndrome Very mild overall.  We will get neck x-ray to rule out any cervical radiculopathy.  Mild Tinel's noted.  Ultrasound today did show some mild dilatation patient did have some mild inflammation over the right axilla.  This could be contributing to it more than truly the dilatation of the nerve sheath.  Patient is to increase activity slowly.  Given brace at night follow-up again 6 to 8 weeks worsening pain with physical therapy or injection     Nonallopathic problems  Decision today to treat with OMT was based on Physical Exam  After verbal consent patient was treated with HVLA, ME, FPR techniques in  thoracic, lumbar, and sacral  areas  Patient tolerated the procedure well with improvement in symptoms  Patient given exercises, stretches and lifestyle modifications  See medications in patient instructions if given  Patient will follow up in 4-8 weeks      The above documentation has been reviewed and is accurate and complete [name], DO       Note: This dictation was prepared with Dragon dictation along with smaller phrase technology. Any transcriptional errors that result from this process are unintentional.

## 2020-09-05 ENCOUNTER — Ambulatory Visit (INDEPENDENT_AMBULATORY_CARE_PROVIDER_SITE_OTHER): Payer: Medicare Other | Admitting: Family Medicine

## 2020-09-05 ENCOUNTER — Encounter: Payer: Self-pay | Admitting: Family Medicine

## 2020-09-05 ENCOUNTER — Ambulatory Visit (INDEPENDENT_AMBULATORY_CARE_PROVIDER_SITE_OTHER): Payer: Medicare Other

## 2020-09-05 ENCOUNTER — Ambulatory Visit: Payer: Self-pay

## 2020-09-05 ENCOUNTER — Other Ambulatory Visit: Payer: Self-pay

## 2020-09-05 VITALS — BP 130/90 | HR 113 | Ht 67.0 in | Wt 201.0 lb

## 2020-09-05 DIAGNOSIS — M79641 Pain in right hand: Secondary | ICD-10-CM

## 2020-09-05 DIAGNOSIS — G8929 Other chronic pain: Secondary | ICD-10-CM | POA: Diagnosis not present

## 2020-09-05 DIAGNOSIS — M545 Low back pain, unspecified: Secondary | ICD-10-CM

## 2020-09-05 DIAGNOSIS — M999 Biomechanical lesion, unspecified: Secondary | ICD-10-CM | POA: Diagnosis not present

## 2020-09-05 DIAGNOSIS — I255 Ischemic cardiomyopathy: Secondary | ICD-10-CM | POA: Diagnosis not present

## 2020-09-05 DIAGNOSIS — G5601 Carpal tunnel syndrome, right upper limb: Secondary | ICD-10-CM | POA: Diagnosis not present

## 2020-09-05 NOTE — Patient Instructions (Signed)
Brace and exercises Vertical mouse  Xray of neck, back and pelvis See me again in 6-7 weeks

## 2020-09-05 NOTE — Assessment & Plan Note (Signed)
Chronic but stable.  We will get repeat x-rays.  Patient would like to know because of secondary to some mild increase in gluteal pain.  No true radicular symptoms.  Patient does respond well to manipulation.  Follow-up again in 6 to 8 weeks

## 2020-09-05 NOTE — Assessment & Plan Note (Signed)
Very mild overall.  We will get neck x-ray to rule out any cervical radiculopathy.  Mild Tinel's noted.  Ultrasound today did show some mild dilatation patient did have some mild inflammation over the right axilla.  This could be contributing to it more than truly the dilatation of the nerve sheath.  Patient is to increase activity slowly.  Given brace at night follow-up again 6 to 8 weeks worsening pain with physical therapy or injection

## 2020-10-01 ENCOUNTER — Other Ambulatory Visit: Payer: Self-pay | Admitting: Family Medicine

## 2020-10-16 NOTE — Progress Notes (Signed)
[place] [address] [place] Phone: [phone] Subjective:   I [name] am serving as a Education administrator for Dr. Hulan Saas.  This visit occurred during the SARS-CoV-2 public health emergency.  Safety protocols were in place, including screening questions prior to the visit, additional usage of staff PPE, and extensive cleaning of exam room while observing appropriate contact time as indicated for disinfecting solutions.   I'm seeing this patient by the request  of:  [name], MD  CC: Neck pain, back pain follow-up  XVQ:[id number]   09/05/2020 Very mild overall.  We will get neck x-ray to rule out any cervical radiculopathy.  Mild Tinel's noted.  Ultrasound today did show some mild dilatation patient did have some mild inflammation over the right axilla.  This could be contributing to it more than truly the dilatation of the nerve sheath.  Patient is to increase activity slowly.  Given brace at night follow-up again 6 to 8 weeks worsening pain with physical therapy or injection  Update 10/16/2020 Elyjah Hazan is a 65 y.o. male coming in with complaint of back and neck pain. OMT 09/05/2020. Right wrist pain f/u as well. Patient states wrist is doing better. Stretches have helped a lot. Working part time so he is stiff today.  Has noticed some increasing stiffness of the lumbar spine at this point.  Patient denies any significant radiation to the legs.  Just feels more stiff than usual.  Has been doing a lot of activity that is not his regular daily basis.  Medications patient has been prescribed: Meloxicam Taking: Yes    09/05/2020 Cervical spine xray IMPRESSION: Cervical spondylosis and foraminal narrowing right greater than left. No acute abnormality.  09/05/2020 Lumbar spine xray IMPRESSION: Mild lumbar disc degeneration  09/05/2020 Pelvis xray IMPRESSION: Negative.     Reviewed prior external information  including notes and imaging from previsou exam, outside providers and external EMR if available.   As well as notes that were available from care everywhere and other healthcare systems.  Past medical history, social, surgical and family history all reviewed in electronic medical record.  No pertanent information unless stated regarding to the chief complaint.   Past Medical History:  Diagnosis Date  . CAD S/P PCI to Cx & RCA    a. s/p PCI of LCx (2006) and ostium of RCA (2007) as well as DES to RCA in [place] (05/2014)  . HTN (hypertension)   . Hyperlipidemia     No Known Allergies   Review of Systems:  No headache, visual changes, nausea, vomiting, diarrhea, constipation, dizziness, abdominal pain, skin rash, fevers, chills, night sweats, weight loss, swollen lymph nodes, joint swelling, chest pain, shortness of breath, mood changes. POSITIVE muscle aches, body aches  Objective  Blood pressure (!) 150/82, pulse 88, height 5\' 7"  (1.702 m), weight 198 lb (89.8 kg), SpO2 96 %.   General: No apparent distress alert and oriented x3 mood and affect normal, dressed appropriately.  HEENT: Pupils equal, extraocular movements intact  Respiratory: Patient's speak in full sentences and does not appear short of breath  Cardiovascular: No lower extremity edema, non tender, no erythema  Neuro: Cranial nerves II through XII are intact, neurovascularly intact in all extremities with 2+ DTRs and 2+ pulses.  Gait normal with good balance and coordination.  MSK:  Non tender with full range of motion and good stability and symmetric strength and tone of shoulders, elbows, wrist, hip, knee and  ankles bilaterally.  Wrist today seems to be unremarkable. Back -patient still has some mild poor core strength.  Patient does have loss of lordosis.  Tenderness to palpation in the paraspinal musculature of the lumbar spine right greater than left.  Osteopathic findings   T8 extended rotated and side bent  left L2 flexed rotated and side bent right Sacrum right on right       Assessment and Plan: Back pain Multifactorial.  Patient does have some mild degenerative disc disease.  Has responded well though to the osteopathic manipulation previously.  Patient does have the Flexeril for breakthrough pain.  We discussed different topical anti-inflammatories.  Patient wants to avoid ibuprofen if possible.  Discussed with patient about icing after activity as well as stretches after a shift.  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  Patient will follow up with me again in 6 weeks.  Worsening pain or any radicular symptoms can consider possibly advanced imaging.  Right carpal tunnel syndrome Patient is not having any significant symptoms at this time even with the increase in activity.  Patient will start to increase activity slowly and follow-up with me 6 weeks     Nonallopathic problems  Decision today to treat with OMT was based on Physical Exam  After verbal consent patient was treated with HVLA, ME, FPR techniques in  thoracic, lumbar, and sacral  areas  Patient tolerated the procedure well with improvement in symptoms  Patient given exercises, stretches and lifestyle modifications  See medications in patient instructions if given  Patient will follow up in 4-8 weeks      The above documentation has been reviewed and is accurate and complete [name], DO       Note: This dictation was prepared with Dragon dictation along with smaller phrase technology. Any transcriptional errors that result from this process are unintentional.

## 2020-10-17 ENCOUNTER — Other Ambulatory Visit: Payer: Self-pay

## 2020-10-17 ENCOUNTER — Encounter: Payer: Self-pay | Admitting: Family Medicine

## 2020-10-17 ENCOUNTER — Ambulatory Visit (INDEPENDENT_AMBULATORY_CARE_PROVIDER_SITE_OTHER): Payer: Medicare Other | Admitting: Family Medicine

## 2020-10-17 VITALS — BP 150/82 | HR 88 | Ht 67.0 in | Wt 198.0 lb

## 2020-10-17 DIAGNOSIS — M549 Dorsalgia, unspecified: Secondary | ICD-10-CM | POA: Diagnosis not present

## 2020-10-17 DIAGNOSIS — G8929 Other chronic pain: Secondary | ICD-10-CM

## 2020-10-17 DIAGNOSIS — M999 Biomechanical lesion, unspecified: Secondary | ICD-10-CM | POA: Diagnosis not present

## 2020-10-17 DIAGNOSIS — G5601 Carpal tunnel syndrome, right upper limb: Secondary | ICD-10-CM

## 2020-10-17 NOTE — Patient Instructions (Addendum)
Good to see you Stay hydrated Do the exercises at the end of the shift Tylenol for pain Arnica lotion to the lower back See me again in 6-7 weeks

## 2020-10-17 NOTE — Assessment & Plan Note (Signed)
Patient is not having any significant symptoms at this time even with the increase in activity.  Patient will start to increase activity slowly and follow-up with me 6 weeks

## 2020-10-17 NOTE — Assessment & Plan Note (Signed)
Multifactorial.  Patient does have some mild degenerative disc disease.  Has responded well though to the osteopathic manipulation previously.  Patient does have the Flexeril for breakthrough pain.  We discussed different topical anti-inflammatories.  Patient wants to avoid ibuprofen if possible.  Discussed with patient about icing after activity as well as stretches after a shift.  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  Patient will follow up with me again in 6 weeks.  Worsening pain or any radicular symptoms can consider possibly advanced imaging.

## 2020-11-27 ENCOUNTER — Ambulatory Visit (INDEPENDENT_AMBULATORY_CARE_PROVIDER_SITE_OTHER): Payer: Medicare Other | Admitting: Dermatology

## 2020-11-27 ENCOUNTER — Other Ambulatory Visit: Payer: Self-pay

## 2020-11-27 ENCOUNTER — Encounter: Payer: Self-pay | Admitting: Dermatology

## 2020-11-27 DIAGNOSIS — T148XXA Other injury of unspecified body region, initial encounter: Secondary | ICD-10-CM

## 2020-11-27 DIAGNOSIS — I255 Ischemic cardiomyopathy: Secondary | ICD-10-CM

## 2020-11-27 DIAGNOSIS — Z1283 Encounter for screening for malignant neoplasm of skin: Secondary | ICD-10-CM | POA: Diagnosis not present

## 2020-11-27 DIAGNOSIS — D1801 Hemangioma of skin and subcutaneous tissue: Secondary | ICD-10-CM | POA: Diagnosis not present

## 2020-11-27 DIAGNOSIS — L821 Other seborrheic keratosis: Secondary | ICD-10-CM

## 2020-11-27 DIAGNOSIS — L918 Other hypertrophic disorders of the skin: Secondary | ICD-10-CM | POA: Diagnosis not present

## 2020-11-27 DIAGNOSIS — D692 Other nonthrombocytopenic purpura: Secondary | ICD-10-CM

## 2020-11-27 DIAGNOSIS — D485 Neoplasm of uncertain behavior of skin: Secondary | ICD-10-CM

## 2020-11-27 DIAGNOSIS — L570 Actinic keratosis: Secondary | ICD-10-CM

## 2020-11-27 NOTE — Progress Notes (Signed)
   Horn on right eyebrow

## 2020-11-27 NOTE — Patient Instructions (Signed)

## 2020-11-27 NOTE — Progress Notes (Signed)
Michael [name] [address] [place] Phone: [phone] Subjective:   I Michael [name] am serving as a Education administrator for Dr. Hulan Saas.  This visit occurred during the SARS-CoV-2 public health emergency.  Safety protocols were in place, including screening questions prior to the visit, additional usage of staff PPE, and extensive cleaning of exam room while observing appropriate contact time as indicated for disinfecting solutions.   I'm seeing this patient by the request  of:  [name], MD  CC: Back and neck pain follow-up  GEX:[id number]  Michael [name] is a 65 y.o. male coming in with complaint of back and neck pain. OMT 10/17/2020. Patient states he is doing well. Not stretching as much but overall he believes he is doing ok.  Pain that stops him from activity.  Patient has not had to use any other medications but has used ibuprofen maybe 2 times since we have seen him.  Not doing quite as much heavy lifting as he was doing previously which he thinks is a little bit beneficial.  Mild anterior left hip pain on the lateral aspect he states with certain walking.  Other than that feels about the same.  Medications patient has been prescribed: Vit D  Taking:         Reviewed prior external information including notes and imaging from previsou exam, outside providers and external EMR if available.   As well as notes that were available from care everywhere and other healthcare systems.  Past medical history, social, surgical and family history all reviewed in electronic medical record.  No pertanent information unless stated regarding to the chief complaint.   Past Medical History:  Diagnosis Date  . CAD S/P PCI to Cx & RCA    a. s/p PCI of LCx (2006) and ostium of RCA (2007) as well as DES to RCA in [place] (05/2014)  . HTN (hypertension)   . Hyperlipidemia     No Known Allergies   Review of Systems:  No headache,  visual changes, nausea, vomiting, diarrhea, constipation, dizziness, abdominal pain, skin rash, fevers, chills, night sweats, weight loss, swollen lymph nodes, body aches, joint swelling, chest pain, shortness of breath, mood changes. POSITIVE muscle aches  Objective  Blood pressure 110/78, pulse 69, height 5\' 7"  (1.702 m), weight 198 lb (89.8 kg), SpO2 99 %.   General: No apparent distress alert and oriented x3 mood and affect normal, dressed appropriately.  HEENT: Pupils equal, extraocular movements intact  Respiratory: Patient's speak in full sentences and does not appear short of breath  Cardiovascular: No lower extremity edema, non tender, no erythema   Gait normal with good balance and coordination.  MSK:  Non tender with full range of motion and good stability and symmetric strength and tone of shoulders, elbows, wrist, hip, knee and ankles bilaterally.  Back -low back exam does have some loss of lordosis.  Patient does have tightness with range of motion of the back.  Patient does have mild tightness of the left FABER test compared to the right.  Mild pain over the glued itself.  Osteopathic findings   T8 extended rotated and side bent left L5 flexed rotated and side bent left Sacrum right on right       Assessment and Plan:  Back pain Patient has had intermittent back pain for quite some time but has been doing well.  Very minimal arthritic changes noted on x-rays previously.  Patient is having a little  bit more left greater than right.  Has had gluteal pain and does have mild what seems to be a little tightness of the tensor fascia lata.  Given new exercises.  Patient does have muscle relaxers if he needs it for breakthrough pain.  Patient will follow up with me again 6 to 8 weeks.    Nonallopathic problems  Decision today to treat with OMT was based on Physical Exam  After verbal consent patient was treated with HVLA, ME, FPR techniques in  thoracic, lumbar, and sacral   areas  Patient tolerated the procedure well with improvement in symptoms  Patient given exercises, stretches and lifestyle modifications  See medications in patient instructions if given  Patient will follow up in 4-8 weeks      The above documentation has been reviewed and is accurate and complete [name], DO       Note: This dictation was prepared with Dragon dictation along with smaller phrase technology. Any transcriptional errors that result from this process are unintentional.

## 2020-11-28 ENCOUNTER — Encounter: Payer: Self-pay | Admitting: Family Medicine

## 2020-11-28 ENCOUNTER — Ambulatory Visit (INDEPENDENT_AMBULATORY_CARE_PROVIDER_SITE_OTHER): Payer: Medicare Other | Admitting: Family Medicine

## 2020-11-28 VITALS — BP 110/78 | HR 69 | Ht 67.0 in | Wt 198.0 lb

## 2020-11-28 DIAGNOSIS — M999 Biomechanical lesion, unspecified: Secondary | ICD-10-CM | POA: Diagnosis not present

## 2020-11-28 DIAGNOSIS — G8929 Other chronic pain: Secondary | ICD-10-CM | POA: Diagnosis not present

## 2020-11-28 DIAGNOSIS — M549 Dorsalgia, unspecified: Secondary | ICD-10-CM | POA: Diagnosis not present

## 2020-11-28 NOTE — Assessment & Plan Note (Signed)
Patient has had intermittent back pain for quite some time but has been doing well.  Very minimal arthritic changes noted on x-rays previously.  Patient is having a little bit more left greater than right.  Has had gluteal pain and does have mild what seems to be a little tightness of the tensor fascia lata.  Given new exercises.  Patient does have muscle relaxers if he needs it for breakthrough pain.  Patient will follow up with me again 6 to 8 weeks.

## 2020-11-28 NOTE — Patient Instructions (Addendum)
Good to see you Exercise 3 times a week Got xrays last time Ice after walking to the side of the hip Continue everything else See me again in 2-3 months

## 2020-12-04 NOTE — Progress Notes (Signed)
Cardiology Office Note   Date:  12/05/2020   ID:  Michael [name], DOB 09/23/1955, MRN [id number]  PCP:  [name], MD  Cardiologist:   Minus Breeding, MD   Chief Complaint  Patient presents with  . Coronary Artery Disease      History of Present Illness: Michael [name] is a 65 y.o. male with CAD, HTN and HLD.  He moved from [place].  Cardiac catheterization done prior to moving demonstrated a right coronary artery lesion that was treated with DES.  In September 2016, he was admitted for unstable angina.  Myoview was low risk.  He had angina again in August 2017 and underwent cardiac catheterization that showed patent stent in the RCA and left circumflex with disease in mid LAD.  He received 2 additional drug-eluting stents.  He was discharged but admitted later with similar chest pain and a troponin elevation a week after discharge.  Relook catheter showed patent LAD stents, left circumflex stent and RCA stent.  There was a 70% small diagonal lesion.  It was suspected he likely had coronary spasm.  Nitrate was added. He was readmitted to the hospital on 12/16/2018 with chest pain.  CT angiogram of the chest was negative for PE.  Myoview obtained on 12/17/2018 was intermediate risk study, finding showed large defect of severe severity present in the inferolateral wall consistent with scar, EF 38% with inferior anterolateral akinesis.  This was followed up using an echocardiogram on the same day which showed EF 40 to 45%, severe akinesis of the entire inferior wall.  No further study was recommended.  He was continued on aspirin, statin and Imdur at 60 mg daily.  Since I last saw him he has done well.  The patient denies any new symptoms such as chest discomfort, neck or arm discomfort. There has been no new shortness of breath, PND or orthopnea. There have been no reported palpitations, presyncope or syncope.  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  This brings  on no symptoms.   Past Medical History:  Diagnosis Date  . CAD S/P PCI to Cx & RCA    a. s/p PCI of LCx (2006) and ostium of RCA (2007) as well as DES to RCA in [place] (05/2014)  . HTN (hypertension)   . Hyperlipidemia     Past Surgical History:  Procedure Laterality Date  . CARDIAC CATHETERIZATION N/A 05/06/2016   Procedure: Left Heart Cath and Coronary Angiography;  Surgeon: Nelva Bush, MD;  Location: [facility];  Service: Cardiovascular;  Laterality: N/A;  . CARDIAC CATHETERIZATION N/A 05/06/2016   Procedure: Coronary Stent Intervention;  Surgeon: Nelva Bush, MD;  Location: [facility];  Service: Cardiovascular;  Laterality: N/A;  . CARDIAC CATHETERIZATION N/A 05/13/2016   Procedure: Coronary/Graft Angiography;  Surgeon: Peter M Martinique, MD;  Location: [facility];  Service: Cardiovascular;  Laterality: N/A;  . CORONARY ANGIOPLASTY WITH STENT PLACEMENT  2006   in [place], occluded CFX, s/p Taxus stent  . CORONARY ANGIOPLASTY WITH STENT PLACEMENT  2007   in [place], ostial RCA stent  . CORONARY ANGIOPLASTY WITH STENT PLACEMENT  2015   in [place], RCA stent  . TONSILLECTOMY AND ADENOIDECTOMY        Current Outpatient Medications  Medication Sig Dispense Refill  . aspirin 81 MG tablet Take 81 mg by mouth daily.    [name] atorvastatin (LIPITOR) 80 MG tablet TAKE 1 TABLET DAILY 90 tablet 3  .  BRILINTA 90 MG TABS tablet TAKE 1 TABLET TWICE A DAY 180 tablet 3  . CHERRY PO Take 50 mg by mouth daily. [place] extract    . Cinnamon 500 MG capsule Take 500 mg by mouth 2 (two) times daily.     . cyanocobalamin (,VITAMIN B-12,) 1000 MCG/ML injection 1000 mcg (1 mg) injection once per week for four weeks. 4 mL 3  . cyclobenzaprine (FLEXERIL) 10 MG tablet Take 1 tablet (10 mg total) by mouth 2 (two) times daily as needed for muscle spasms. 20 tablet 0  . hydrocortisone (ANUSOL-HC) 2.5 % rectal cream Place 1 application rectally 2 (two) times daily. 30  g 1  . isosorbide mononitrate (IMDUR) 60 MG 24 hr tablet TAKE 1 TABLET DAILY 90 tablet 3  . nitroGLYCERIN (NITROSTAT) 0.4 MG SL tablet DISSOLVE 1 TABLET UNDER THE TONGUE EVERY 5 MINUTES AS NEEDED FOR CHEST PAIN UP TO 3 DOSES 25 tablet 1  . Prasterone, DHEA, (DHEA 50 PO) Take 1 capsule by mouth daily. Take for 4 weeks, then off for 2 weeks    . Saw Palmetto, Serenoa repens, (SAW PALMETTO BERRY PO) Take 1 tablet by mouth in the morning and at bedtime.    . Turmeric 500 MG CAPS Take 500 mg by mouth daily.     . Vitamin D, Ergocalciferol, (DRISDOL) 1.25 MG (50000 UNIT) CAPS capsule TAKE 1 CAPSULE EVERY 7 DAYS 12 capsule 3   No current facility-administered medications for this visit.    Allergies:   Patient has no known allergies.   ROS:  Please see the history of present illness.   Otherwise, review of systems are positive for none.   All other systems are reviewed and negative.    PHYSICAL EXAM: VS:  BP 96/62 (BP Location: Left Arm, Patient Position: Sitting, Cuff Size: Normal)   Pulse 64   Ht 5' 6.5" (1.689 m)   Wt 198 lb (89.8 kg)   BMI 31.48 kg/m  , BMI Body mass index is 31.48 kg/m. GENERAL:  Well appearing NECK:  No jugular venous distention, waveform within normal limits, carotid upstroke brisk and symmetric, no bruits, no thyromegaly LUNGS:  Clear to auscultation bilaterally CHEST:  Unremarkable HEART:  PMI not displaced or sustained,S1 and S2 within normal limits, no S3, no S4, no clicks, no rubs, no murmurs ABD:  Flat, positive bowel sounds normal in frequency in pitch, no bruits, no rebound, no guarding, no midline pulsatile mass, no hepatomegaly, no splenomegaly EXT:  2 plus pulses throughout, no edema, no cyanosis no clubbing     EKG:  EKG is  ordered today. The ekg ordered today demonstrates sinus rhythm, rate 8864, axis within normal limits, intervals within normal limits, no acute ST-T wave changes.  Low voltage limb leads.     Recent Labs: 08/03/2020: ALT 29; BUN  20; Creat 0.96; Hemoglobin 14.5; Platelets 240; Potassium 4.1; Sodium 138; TSH 2.14    Lipid Panel    Component Value Date/Time   CHOL 117 08/03/2020 1448   TRIG 117 08/03/2020 1448   HDL 43 08/03/2020 1448   CHOLHDL 2.7 08/03/2020 1448   VLDL 17 12/17/2018 0520   LDLCALC 54 08/03/2020 1448   LDLDIRECT 62.0 09/14/2019 0851      Wt Readings from Last 3 Encounters:  12/05/20 198 lb (89.8 kg)  11/28/20 198 lb (89.8 kg)  10/17/20 198 lb (89.8 kg)      Other studies Reviewed: Additional studies/ records that were reviewed today include: Labs Review of the  above records demonstrates:   Please see elsewhere in the note.     ASSESSMENT AND PLAN:  Ischemic cardiomyopathy:    His blood pressure is low and would not allow for med titration.  In fact he has been taken off of beta-blockers and ACE inhibitors in the past.  He has class I symptoms.  No change in therapy.  I will consider follow-up echocardiography at the next visit.   CAD:  The patient has no new sypmtoms.  No further cardiovascular testing is indicated.  We will continue with aggressive risk reduction and meds as listed.  Of note given the number of steps and interventions we are continuing indefinitely with dual antiplatelet therapy.  Hypertension:   BP is low but he has no symptoms related to this.  He can continue the meds as listed.  Hyperlipidemia:  LDL is 54 with an HDL of 43.  No change in therapy.   Current medicines are reviewed at length with the patient today.  The patient does not have concerns regarding medicines.  The following changes have been made:   None  Labs/ tests ordered today include: None  Orders Placed This Encounter  Procedures  . EKG 12-Lead     Disposition:   FU with me in 12 months.     Signed, Minus Breeding, MD  12/05/2020 9:17 AM    [place]

## 2020-12-05 ENCOUNTER — Ambulatory Visit (INDEPENDENT_AMBULATORY_CARE_PROVIDER_SITE_OTHER): Payer: Medicare Other | Admitting: Cardiology

## 2020-12-05 ENCOUNTER — Encounter: Payer: Self-pay | Admitting: Cardiology

## 2020-12-05 ENCOUNTER — Other Ambulatory Visit: Payer: Self-pay

## 2020-12-05 VITALS — BP 96/62 | HR 64 | Ht 66.5 in | Wt 198.0 lb

## 2020-12-05 DIAGNOSIS — E785 Hyperlipidemia, unspecified: Secondary | ICD-10-CM

## 2020-12-05 DIAGNOSIS — I10 Essential (primary) hypertension: Secondary | ICD-10-CM

## 2020-12-05 DIAGNOSIS — I255 Ischemic cardiomyopathy: Secondary | ICD-10-CM | POA: Diagnosis not present

## 2020-12-05 DIAGNOSIS — I2510 Atherosclerotic heart disease of native coronary artery without angina pectoris: Secondary | ICD-10-CM | POA: Diagnosis not present

## 2020-12-05 NOTE — Patient Instructions (Signed)
Medication Instructions:   No changes  *If you need a refill on your cardiac medications before your next appointment, please call your pharmacy*   Lab Work: Not needed    Testing/Procedures:  Not needed  Follow-Up: At [facility], you and your health needs are our priority.  As part of our continuing mission to provide you with exceptional heart care, we have created designated Provider Care Teams.  These Care Teams include your primary Cardiologist (physician) and Advanced Practice Providers (APPs -  Physician Assistants and Nurse Practitioners) who all work together to provide you with the care you need, when you need it.     Your next appointment:   12 month(s)  The format for your next appointment:   In Person  Provider:   James Hochrein, MD   

## 2020-12-14 ENCOUNTER — Encounter: Payer: Self-pay | Admitting: Dermatology

## 2020-12-15 NOTE — Progress Notes (Signed)
   New Patient   Subjective  Michael [name] is a 65 y.o. male who presents for the following: New Patient (Initial Visit) (Patient here today for spot behind right ear x years per patient it has started growing and itching, no bleeding.  Check spot on left flank x 1 year per patient it's dark, no bleeding, patient would also like his back checked there is a spot that itches but he can't see it.).  Several skin issues to discuss.  New growth behind the right ear. Location:  Duration:  Quality:  Associated Signs/Symptoms: Modifying Factors:  Severity:  Timing: Context:    The following portions of the chart were reviewed this encounter and updated as appropriate:  Tobacco  Allergies  Meds  Problems  Med Hx  Surg Hx  Fam Hx      Objective  Well appearing patient in no apparent distress; mood and affect are within normal limits. Objective  Mid Back: Stuck-on, waxy papules and plaques. Many, behind right ear and side of neck, both legs post. And ant., upper right sideburn.  Includes spots on back that concern the patient.  Objective  Right Abdomen (side) - Upper: Many; chest, back, abdomen: 1 to 3 mm smooth red papules  Objective  Chest - Medial (Center): No atypical moles, or pigmented lesion: Waist up examination  Objective  Left Lower Eyelid, Right Lower Eyelid: Under both eyes, right neck: Pedunculated 1 to 3 mm flesh-colored papules  Objective  Right neck: Verrucous 6 mm pink papule     Objective  Left Forearm - Posterior: Four 1 cm ecchymoses, no history of abnormal bleeding.  Objective  Right Eyebrow: Small hornlike pink papule   A full examination was performed including scalp, head, eyes, ears, nose, lips, neck, chest, axillae, abdomen, back, buttocks, bilateral upper extremities, bilateral lower extremities, hands, feet, fingers, toes, fingernails, and toenails. All findings within normal limits unless otherwise noted below.   Assessment & Plan   Seborrheic keratosis Mid Back  Leave if stable  Cherry angioma Right Abdomen (side) - Upper  No intervention currently necessary  Screening exam for skin cancer Chest - Medial (Center)  Skin tag (2) Left Lower Eyelid; Right Lower Eyelid  Patient may choose to schedule removal in future.  Neoplasm of uncertain behavior of skin Right neck  Skin / nail biopsy Type of biopsy: tangential   Informed consent: discussed and consent obtained   Timeout: patient name, date of birth, surgical site, and procedure verified   Anesthesia: the lesion was anesthetized in a standard fashion   Anesthetic:  1% lidocaine w/ epinephrine 1-100,000 local infiltration Instrument used: flexible razor blade   Hemostasis achieved with: aluminum chloride and electrodesiccation   Outcome: patient tolerated procedure well   Post-procedure details: wound care instructions given    Specimen 1 - Surgical pathology Differential Diagnosis: bcc vs scc tag  Check Margins: No  Cautery only  Solar purpura ([place]) Left Forearm - Posterior  Patient may look for over-the-counter Dermend then apply this daily after bathing to see if it reduces easy bruising on arms.  AK (actinic keratosis) Right Eyebrow  History that this may be new onset, return if persists or grows

## 2021-01-19 ENCOUNTER — Emergency Department (HOSPITAL_BASED_OUTPATIENT_CLINIC_OR_DEPARTMENT_OTHER)
Admission: EM | Admit: 2021-01-19 | Discharge: 2021-01-19 | Disposition: A | Discharge status: Home / Self Care | Payer: Medicare Other | Attending: Emergency Medicine | Admitting: Emergency Medicine

## 2021-01-19 ENCOUNTER — Encounter (HOSPITAL_BASED_OUTPATIENT_CLINIC_OR_DEPARTMENT_OTHER): Payer: Self-pay

## 2021-01-19 ENCOUNTER — Emergency Department (HOSPITAL_BASED_OUTPATIENT_CLINIC_OR_DEPARTMENT_OTHER): Payer: Medicare Other | Admitting: Radiology

## 2021-01-19 DIAGNOSIS — Z8616 Personal history of COVID-19: Secondary | ICD-10-CM | POA: Insufficient documentation

## 2021-01-19 DIAGNOSIS — Z79899 Other long term (current) drug therapy: Secondary | ICD-10-CM | POA: Insufficient documentation

## 2021-01-19 DIAGNOSIS — Z951 Presence of aortocoronary bypass graft: Secondary | ICD-10-CM | POA: Diagnosis not present

## 2021-01-19 DIAGNOSIS — Z7982 Long term (current) use of aspirin: Secondary | ICD-10-CM | POA: Diagnosis not present

## 2021-01-19 DIAGNOSIS — M79602 Pain in left arm: Secondary | ICD-10-CM

## 2021-01-19 DIAGNOSIS — R079 Chest pain, unspecified: Secondary | ICD-10-CM

## 2021-01-19 DIAGNOSIS — M79622 Pain in left upper arm: Secondary | ICD-10-CM | POA: Insufficient documentation

## 2021-01-19 DIAGNOSIS — I2510 Atherosclerotic heart disease of native coronary artery without angina pectoris: Secondary | ICD-10-CM | POA: Diagnosis not present

## 2021-01-19 DIAGNOSIS — I10 Essential (primary) hypertension: Secondary | ICD-10-CM | POA: Insufficient documentation

## 2021-01-19 LAB — CBC
HCT: 39.8 % (ref 39.0–52.0)
Hemoglobin: 14 g/dL (ref 13.0–17.0)
MCH: 32 pg (ref 26.0–34.0)
MCHC: 35.2 g/dL (ref 30.0–36.0)
MCV: 91.1 fL (ref 80.0–100.0)
Platelets: 214 10*3/uL (ref 150–400)
RBC: 4.37 MIL/uL (ref 4.22–5.81)
RDW: 12.9 % (ref 11.5–15.5)
WBC: 6.9 10*3/uL (ref 4.0–10.5)
nRBC: 0 % (ref 0.0–0.2)

## 2021-01-19 LAB — BASIC METABOLIC PANEL
Anion gap: 9 (ref 5–15)
BUN: 18 mg/dL (ref 8–23)
CO2: 24 mmol/L (ref 22–32)
Calcium: 9.1 mg/dL (ref 8.9–10.3)
Chloride: 106 mmol/L (ref 98–111)
Creatinine, Ser: 0.87 mg/dL (ref 0.61–1.24)
GFR, Estimated: >60 mL/min (ref >60)
Glucose, Bld: 108 mg/dL — ABNORMAL HIGH (ref 70–99)
Potassium: 3.7 mmol/L (ref 3.5–5.1)
Sodium: 139 mmol/L (ref 135–145)

## 2021-01-19 LAB — TROPONIN I (HIGH SENSITIVITY)
Troponin I (High Sensitivity): 5 ng/L (ref <18)
Troponin I (High Sensitivity): 5 ng/L (ref <18)

## 2021-01-19 NOTE — ED Triage Notes (Signed)
He reports some central chest pain radiating into his left arm. He is in no distress. He states "I just had a visit with my cardiologist and everything looked good".

## 2021-01-19 NOTE — ED Notes (Signed)
Pt was discharged home by Dillon Bjork, RN

## 2021-01-19 NOTE — ED Provider Notes (Signed)
[facility] EMERGENCY DEPT Provider Note   CSN: [id number] Arrival date & time: 01/19/21  1525     History Chief Complaint  Patient presents with  . Chest Pain    Michael [name] is a 65 y.o. male.  HPI Patient presents with left-sided chest pain/arm pain.  Brought in for pain in the left arm.  Started more towards her shoulder and now down more towards the elbow.  States it actually felt a little better with movement.  Patient is worried because he has had previous heart attacks.  States has had a slight dull lateral left chest pain that he has been told previously was musculoskeletal but also states he has had some previous heart issues.  No nausea or vomiting.  No diarrhea.  No shortness of breath.  States he was able to push mower the other day without any chest pain exertion or shortness of breath.  No swelling in his legs.  Did take a nitroglycerin earlier today and it did help with the arm pain.  Pain is dull.  Pain is improved somewhat now.    Past Medical History:  Diagnosis Date  . CAD S/P PCI to Cx & RCA    a. s/p PCI of LCx (2006) and ostium of RCA (2007) as well as DES to RCA in [place] (05/2014)  . HTN (hypertension)   . Hyperlipidemia     Patient Active Problem List   Diagnosis Date Noted  . Right carpal tunnel syndrome 09/05/2020  . Perianal irritation 06/08/2020  . Ischemic cardiomyopathy 12/05/2019  . Educated about COVID-19 virus infection 12/05/2019  . History of adenomatous polyp of colon 06/07/2018  . BPH (benign prostatic hyperplasia) 06/07/2018  . Vitiligo 06/07/2018  . Gluteal pain 04/10/2018  . Plantar fasciitis 10/12/2017  . Back pain 04/13/2017  . Nonallopathic lesion of thoracic region 04/13/2017  . Nonallopathic lesion of sacral region 04/13/2017  . Nonallopathic lesion of lumbosacral region 04/13/2017  . Impaired glucose tolerance 11/03/2016  . Obesity   . HTN (hypertension)   . CAD S/P multiple PCIs 09/08/2014  .  Dyslipidemia 09/08/2014    Past Surgical History:  Procedure Laterality Date  . CARDIAC CATHETERIZATION N/A 05/06/2016   Procedure: Left Heart Cath and Coronary Angiography;  Surgeon: Nelva Bush, MD;  Location: [facility];  Service: Cardiovascular;  Laterality: N/A;  . CARDIAC CATHETERIZATION N/A 05/06/2016   Procedure: Coronary Stent Intervention;  Surgeon: Nelva Bush, MD;  Location: [facility];  Service: Cardiovascular;  Laterality: N/A;  . CARDIAC CATHETERIZATION N/A 05/13/2016   Procedure: Coronary/Graft Angiography;  Surgeon: Peter M Martinique, MD;  Location: [facility];  Service: Cardiovascular;  Laterality: N/A;  . CORONARY ANGIOPLASTY WITH STENT PLACEMENT  2006   in [place], occluded CFX, s/p Taxus stent  . CORONARY ANGIOPLASTY WITH STENT PLACEMENT  2007   in [place], ostial RCA stent  . CORONARY ANGIOPLASTY WITH STENT PLACEMENT  2015   in [place], RCA stent  . TONSILLECTOMY AND ADENOIDECTOMY          Family History  Problem Relation Age of Onset  . Hypertension Father   . Dementia Father        Vascular. patient states dementia/possible alzheimers as well  . Hyperlipidemia Sister   . Cancer Brother        type unknown  . Cancer Brother        type unknown  . Heart disease Maternal Grandfather 55       Died  probably of heart diseasse  . Heart disease Maternal Uncle 73       Died of "heart exploding"  . Cerebral palsy Daughter   . Stroke Son        severe problems with blood clotting  . Colon cancer Neg Hx   . Esophageal cancer Neg Hx   . Pancreatic cancer Neg Hx   . Stomach cancer Neg Hx   . Liver disease Neg Hx     Social History   Tobacco Use  . Smoking status: Never Smoker  . Smokeless tobacco: Never Used  Vaping Use  . Vaping Use: Never used  Substance Use Topics  . Alcohol use: No    Alcohol/week: 0.0 standard drinks  . Drug use: No    Home Medications Prior to Admission medications   Medication Sig  Start Date End Date Taking? Authorizing Provider  aspirin 81 MG tablet Take 81 mg by mouth daily.    [provider]  atorvastatin (LIPITOR) 80 MG tablet TAKE 1 TABLET DAILY 03/12/20   Almyra Deforest, [place]  BRILINTA 90 MG TABS tablet TAKE 1 TABLET TWICE A DAY 03/12/20   Almyra Deforest, PA  CHERRY PO Take 50 mg by mouth daily. Cherry extract    [provider]  Cinnamon 500 MG capsule Take 500 mg by mouth 2 (two) times daily.     [provider]  cyanocobalamin (,VITAMIN B-12,) 1000 MCG/ML injection 1000 mcg (1 mg) injection once per week for four weeks. 08/07/20   [name], MD  cyclobenzaprine (FLEXERIL) 10 MG tablet Take 1 tablet (10 mg total) by mouth 2 (two) times daily as needed for muscle spasms. 09/14/19   [name], MD  hydrocortisone (ANUSOL-HC) 2.5 % rectal cream Place 1 application rectally 2 (two) times daily. 06/08/20   Zehr, Laban Emperor, PA-C  isosorbide mononitrate (IMDUR) 60 MG 24 hr tablet TAKE 1 TABLET DAILY 03/12/20   Almyra Deforest, PA  nitroGLYCERIN (NITROSTAT) 0.4 MG SL tablet DISSOLVE 1 TABLET UNDER THE TONGUE EVERY 5 MINUTES AS NEEDED FOR CHEST PAIN UP TO 3 DOSES 11/28/19   Minus Breeding, MD  Prasterone, DHEA, (DHEA 50 PO) Take 1 capsule by mouth daily. Take for 4 weeks, then off for 2 weeks    [provider]  Saw Palmetto, Serenoa repens, (SAW PALMETTO BERRY PO) Take 1 tablet by mouth in the morning and at bedtime.    [provider]  Turmeric 500 MG CAPS Take 500 mg by mouth daily.     [provider]  Vitamin D, Ergocalciferol, (DRISDOL) 1.25 MG (50000 UNIT) CAPS capsule TAKE 1 CAPSULE EVERY 7 DAYS 10/01/20   [name], DO    Allergies    Patient has no known allergies.  Review of Systems   Review of Systems  Constitutional: Negative for appetite change and fever.  HENT: Negative for congestion.   Respiratory: Negative for shortness of breath.   Cardiovascular: Positive for chest pain.  Genitourinary: Negative  for flank pain.  Musculoskeletal: Negative for back pain.       Left arm pain.  Skin: Negative for rash.  Neurological: Negative for weakness.  Psychiatric/Behavioral: Negative for confusion.    Physical Exam Updated Vital Signs BP 138/68   Pulse 70   Temp 97.9 F (36.6 C) (Oral)   Resp 15   SpO2 99%   Physical Exam Vitals and nursing note reviewed.  Constitutional:      Appearance: Normal appearance.  HENT:  Head: Atraumatic.     Right Ear: External ear normal.     Left Ear: External ear normal.     Nose: Nose normal.  Eyes:     Pupils: Pupils are equal, round, and reactive to light.  Cardiovascular:     Rate and Rhythm: Regular rhythm.  Pulmonary:     Breath sounds: No wheezing or rhonchi.  Abdominal:     Tenderness: There is no abdominal tenderness.  Musculoskeletal:        General: No tenderness.     Cervical back: Neck supple.     Comments: Good range of motion left upper extremity.  No tenderness over shoulder elbow or upper arm.  Neurovascular intact in left hand.  Skin:    General: Skin is warm.     Capillary Refill: Capillary refill takes less than 2 seconds.  Neurological:     Mental Status: He is alert and oriented to person, place, and time.  Psychiatric:        Mood and Affect: Mood normal.     ED Results / Procedures / Treatments   Labs (all labs ordered are listed, but only abnormal results are displayed) Labs Reviewed  BASIC METABOLIC PANEL - Abnormal; Notable for the following components:      Result Value   Glucose, Bld 108 (*)    All other components within normal limits  CBC  TROPONIN I (HIGH SENSITIVITY)  TROPONIN I (HIGH SENSITIVITY)    EKG EKG Interpretation  Date/Time:  Saturday January 19 2021 15:35:54 EDT Ventricular Rate:  77 PR Interval:  157 QRS Duration: 104 QT Interval:  396 QTC Calculation: 449 R Axis:   71 Text Interpretation: Sinus rhythm No significant change since last tracing Confirmed by Davonna Belling  [phone]) on 01/19/2021 3:43:50 PM   Radiology DG Chest 2 View  Result Date: 01/19/2021 CLINICAL DATA:  Chest pain EXAM: CHEST - 2 VIEW COMPARISON:  Chest x-ray dated 09/14/2019 FINDINGS: Heart size and mediastinal contours are within normal limits. LEFT costophrenic angle is excluded on the AP view. Nodular density at the RIGHT lung base is most likely related to nipple shadow. Additional density projected over the upper thoracic spine on the lateral view, of uncertain significance, possibly superimposition of normal osseous structures. Lungs appear otherwise clear. No pleural effusion or pneumothorax is seen. IMPRESSION: 1. Rounded density projected over the upper thoracic spine on the lateral view only, suspected superimposition of normal osseous structures but similar findings are not seen on previous exams so neoplastic mass or focal rounded pneumonia cannot be confidently excluded. Recommend chest CT for definitive characterization. 2. Additional nodular density at the RIGHT lung base is most likely nipple shadow. This can be also further characterized on the chest CT recommended above. 3. Lungs otherwise clear. No convincing evidence of pneumonia. No evidence of pulmonary edema. Electronically Signed   By: Franki Cabot M.D.   On: 01/19/2021 17:07    Procedures Procedures   Medications Ordered in ED Medications - No data to display  ED Course  I have reviewed the triage vital signs and the nursing notes.  Pertinent labs & imaging results that were available during my care of the patient were reviewed by me and considered in my medical decision making (see chart for details).    MDM Rules/Calculators/A&P                          Patient with arm pain.  States got a  little better with moving around.  Has cardiac history.  States he has been able to do exertion without any increase of pain.  Has had left-sided chest pain last couple days but able to walk and work without difficulty.  EKG  reassuring.  Troponin negative x2.  Chest x-ray reassuring.  Doubt pulm embolism.  Doubt pneumonia.  Doubt cardiac cause ischemia.  I think patient stable for discharge home with outpatient follow-up.  Final Clinical Impression(s) / ED Diagnoses Final diagnoses:  Pain of left upper extremity  Nonspecific chest pain    Rx / DC Orders ED Discharge Orders    None       Davonna Belling, MD 01/19/21 1901

## 2021-01-24 ENCOUNTER — Ambulatory Visit: Payer: Self-pay

## 2021-01-24 ENCOUNTER — Ambulatory Visit (INDEPENDENT_AMBULATORY_CARE_PROVIDER_SITE_OTHER): Payer: Medicare Other | Admitting: Family Medicine

## 2021-01-24 ENCOUNTER — Encounter: Payer: Self-pay | Admitting: Family Medicine

## 2021-01-24 ENCOUNTER — Ambulatory Visit (INDEPENDENT_AMBULATORY_CARE_PROVIDER_SITE_OTHER): Payer: Medicare Other

## 2021-01-24 ENCOUNTER — Other Ambulatory Visit: Payer: Self-pay

## 2021-01-24 VITALS — BP 112/72 | HR 72 | Ht 66.5 in | Wt 202.0 lb

## 2021-01-24 DIAGNOSIS — M79602 Pain in left arm: Secondary | ICD-10-CM | POA: Diagnosis not present

## 2021-01-24 DIAGNOSIS — M19019 Primary osteoarthritis, unspecified shoulder: Secondary | ICD-10-CM | POA: Insufficient documentation

## 2021-01-24 DIAGNOSIS — R079 Chest pain, unspecified: Secondary | ICD-10-CM | POA: Diagnosis not present

## 2021-01-24 DIAGNOSIS — M19012 Primary osteoarthritis, left shoulder: Secondary | ICD-10-CM | POA: Diagnosis not present

## 2021-01-24 DIAGNOSIS — I255 Ischemic cardiomyopathy: Secondary | ICD-10-CM

## 2021-01-24 DIAGNOSIS — M25512 Pain in left shoulder: Secondary | ICD-10-CM | POA: Diagnosis not present

## 2021-01-24 DIAGNOSIS — R9389 Abnormal findings on diagnostic imaging of other specified body structures: Secondary | ICD-10-CM | POA: Insufficient documentation

## 2021-01-24 NOTE — Assessment & Plan Note (Signed)
Patient given injection and tolerated the procedure well, discussed icing regimen and home exercises, discussed which he needs to do which wants to avoid.  Discussed topical anti-inflammatories.  We will see how patient responds.  On ultrasound there is some mild findings of acromioclavicular arthritis as well as the mild partial tearing of the rotator cuff.  Patient will limit the repetitive motions as as well as the heavy lifting.  Follow-up with me again in 6 weeks.  Differential does include the possibility of cervical radiculopathy and if continuing to have discomfort and pain I do feel MRI of the neck with potential epidurals would be next.

## 2021-01-24 NOTE — Progress Notes (Signed)
Michael [name] Phone: [phone] Subjective:   Michael [name], am serving as a scribe for Dr. Hulan [name]. This visit occurred during the SARS-CoV-2 public health emergency.  Safety protocols were in place, including screening questions prior to the visit, additional usage of staff PPE, and extensive cleaning of exam room while observing appropriate contact time as indicated for disinfecting solutions.   I'm seeing this patient by the request  of:  Michael Olp, MD  CC: Arm pain  WGN:[id number]  Michael [name] is a 65 y.o. male coming in with complaint of arm pain.  Patient was having pain in the left upper extremity with nonspecific chest pain.  Seen in the emergency room on January 19, 2021 these notes were reviewed.  Patient did have an EKG when he was done there that shows [name] significant changes from previous EKGs.  Chest x-ray was normal except for a rounded density over the upper thoracic spine of unknown characteristics.  Difficult to assess Patient also had troponins negative x2.  For past 3 weeks patient has had L arm pain and burning. Pain can be at clavicle or at elbow. Most of the time pain is over tricep. Works part-time and lifts heavy items but is unsure if he has true injury.  Pain is intermittent but always has pain while working on his computer.      Past Medical History:  Diagnosis Date  . CAD S/P PCI to Cx & RCA    a. s/p PCI of LCx (2006) and ostium of RCA (2007) as well as DES to RCA in [place] (05/2014)  . HTN (hypertension)   . Hyperlipidemia    Past Surgical History:  Procedure Laterality Date  . CARDIAC CATHETERIZATION N/A 05/06/2016   Procedure: Left Heart Cath and Coronary Angiography;  Surgeon: Michael Bush, MD;  Location: [facility];  Service: Cardiovascular;  Laterality: N/A;  . CARDIAC CATHETERIZATION N/A 05/06/2016   Procedure: Coronary Stent Intervention;   Surgeon: Michael Bush, MD;  Location: [facility];  Service: Cardiovascular;  Laterality: N/A;  . CARDIAC CATHETERIZATION N/A 05/13/2016   Procedure: Coronary/Graft Angiography;  Surgeon: Michael M Martinique, MD;  Location: [facility];  Service: Cardiovascular;  Laterality: N/A;  . CORONARY ANGIOPLASTY WITH STENT PLACEMENT  2006   in [place], occluded CFX, s/p Taxus stent  . CORONARY ANGIOPLASTY WITH STENT PLACEMENT  2007   in [place], ostial RCA stent  . CORONARY ANGIOPLASTY WITH STENT PLACEMENT  2015   in [place], RCA stent  . TONSILLECTOMY AND ADENOIDECTOMY      Social History   Socioeconomic History  . Marital status: Married    Spouse name: Not on file  . Number of children: 4  . Years of education: Not on file  . Highest education level: Not on file  Occupational History  . Occupation: retired  Tobacco Use  . Smoking status: Never Smoker  . Smokeless tobacco: Never Used  Vaping Use  . Vaping Use: Never used  Substance and Sexual Activity  . Alcohol use: [name]    Alcohol/week: 0.0 standard drinks  . Drug use: [name]  . Sexual activity: Yes  Other Topics Concern  . Not on file  Social History Narrative   Lives with wife.  4 kids total (2 step, 2 biological). 1 that has CP. Daughter Michael [name]- comes to [facility]).        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      Hobbies: gardening, lawn care, care for grandkids   Social Determinants of Health   Financial Resource Strain: Not on file  Food Insecurity: Not on file  Transportation Needs: Not on file  Physical Activity: Not on file  Stress: Not on file  Social Connections: Not on file   [name] Known Allergies Family History  Problem Relation Age of Onset  . Hypertension Father   . Dementia Father        Vascular. patient states dementia/possible alzheimers as well  . Hyperlipidemia Sister   . Cancer Brother        type unknown  . Cancer Brother        type unknown  . Heart  disease Maternal Grandfather 36       Died probably of heart diseasse  . Heart disease Maternal Uncle 51       Died of "heart exploding"  . Cerebral palsy Daughter   . Stroke Son        severe problems with blood clotting  . Colon cancer Neg Hx   . Esophageal cancer Neg Hx   . Pancreatic cancer Neg Hx   . Stomach cancer Neg Hx   . Liver disease Neg Hx      Current Outpatient Medications (Cardiovascular):  .  atorvastatin (LIPITOR) 80 MG tablet, TAKE 1 TABLET DAILY .  isosorbide mononitrate (IMDUR) 60 MG 24 hr tablet, TAKE 1 TABLET DAILY .  nitroGLYCERIN (NITROSTAT) 0.4 MG SL tablet, DISSOLVE 1 TABLET UNDER THE TONGUE EVERY 5 MINUTES AS NEEDED FOR CHEST PAIN UP TO 3 DOSES   Current Outpatient Medications (Analgesics):  .  aspirin 81 MG tablet, Take 81 mg by mouth daily.  Current Outpatient Medications (Hematological):  [name]  BRILINTA 90 MG TABS tablet, TAKE 1 TABLET TWICE A DAY .  cyanocobalamin (,VITAMIN B-12,) 1000 MCG/ML injection, 1000 mcg (1 mg) injection once per week for four weeks.  Current Outpatient Medications (Other):  [name]  CHERRY PO, Take 50 mg by mouth daily. Edgemere extract .  Cinnamon 500 MG capsule, Take 500 mg by mouth 2 (two) times daily.  .  cyclobenzaprine (FLEXERIL) 10 MG tablet, Take 1 tablet (10 mg total) by mouth 2 (two) times daily as needed for muscle spasms. .  hydrocortisone (ANUSOL-HC) 2.5 % rectal cream, Place 1 application rectally 2 (two) times daily. .  Prasterone, DHEA, (DHEA 50 PO), Take 1 capsule by mouth daily. Take for 4 weeks, then off for 2 weeks .  Saw Palmetto, Serenoa repens, (SAW PALMETTO BERRY PO), Take 1 tablet by mouth in the morning and at bedtime. .  Turmeric 500 MG CAPS, Take 500 mg by mouth daily.  .  Vitamin D, Ergocalciferol, (DRISDOL) 1.25 MG (50000 UNIT) CAPS capsule, TAKE 1 CAPSULE EVERY 7 DAYS   Reviewed prior external information including notes and imaging from  primary care provider As well as notes that were available  from care everywhere and other healthcare systems.  Past medical history, social, surgical and family history all reviewed in electronic medical record.  [name] pertanent information unless stated regarding to the chief complaint.   Review of Systems:  [name] headache, visual changes, nausea, vomiting, diarrhea, constipation, dizziness, abdominal pain, skin rash, fevers, chills, night sweats, weight loss, swollen lymph nodes, body aches, joint swelling, chest pain, shortness of breath, mood changes. POSITIVE muscle aches  Objective  There were [name] vitals taken for this visit.   General: [name] apparent distress alert  and oriented x3 mood and affect normal, dressed appropriately.  HEENT: Pupils equal, extraocular movements intact  Respiratory: Patient's speak in full sentences and does not appear short of breath  Cardiovascular: [name] lower extremity edema, non tender, [name] erythema  Gait normal with good balance and coordination.  MSK: Patient's neck exam does have some loss of lordosis.  Patient does lack the last 10 degrees of extension.  Mild positive Spurling's noted on the left side with mild radicular symptoms only to the upper arm.  Good grip strength noted.  Left shoulder exam [name] does show the patient does have positive impingement noted.  Mild positive crossover noted as well.  Rotator cuff strength though does appear to be intact.  Does have some very mild limitation with internal range of motion to sacrum.  Limited musculoskeletal ultrasound was performed and interpreted by [name]  Limited ultrasound of patient's left shoulder shows the patient does have acromioclavicular arthritis noted with a positive Sign.  Patient does have also what appears to be a very small partial-thickness tear of the supraspinatus was noted with [name] true retraction noted. 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  Verbal informed consent obtained.  Time-out conducted.  Noted [name] overlying erythema, induration, or other signs of local infection.  Skin prepped in a sterile fashion.  Local anesthesia: Topical Ethyl chloride.  With sterile technique and under real time ultrasound guidance:  Joint visualized.  21g 2 inch needle inserted posterior approach. Pictures taken for needle placement. Patient did have injection of 2 cc of 0.5% Marcaine, and 1cc of Kenalog 40 mg/dL. Completed without difficulty  Pain immediately resolved suggesting accurate placement of the medication.  Advised to call if fevers/chills, erythema, induration, drainage, or persistent bleeding.  Impression: Technically successful ultrasound guided injection.  Procedure: Real-time Ultrasound Guided Injection of left acromioclavicular joint Device: GE Logiq Q7 Ultrasound guided injection is preferred based studies that show increased duration, increased effect, greater accuracy, decreased procedural pain, increased response rate, and decreased cost with ultrasound guided versus blind injection.  Verbal informed consent obtained.  Time-out conducted.  Noted [name] overlying erythema, induration, or other signs of local infection.  Skin prepped in a sterile fashion.  Local anesthesia: Topical Ethyl chloride.  With sterile technique and under real time ultrasound guidance: With a 25-gauge half inch needle injected with 0.5 cc of 0.5% Marcaine and 0.5 cc of Kenalog 40 mg/mL Completed without difficulty  Pain immediately resolved suggesting accurate placement of the medication.  Advised to call if fevers/chills, erythema, induration, drainage, or persistent bleeding.  Impression: Technically  successful ultrasound guided injection.    Impression and Recommendations:     The above documentation has been reviewed and is accurate and complete [name], DO

## 2021-01-24 NOTE — Assessment & Plan Note (Signed)
Noted on chest x-ray when patient was in the emergency room.  No shortness of breath but will get CT without contrast to further evaluate.

## 2021-01-24 NOTE — Assessment & Plan Note (Signed)
Injection given today.  Tolerated the procedure well, discussed icing regimen and home exercises.  Differential includes may be a possibility of cervical radiculopathy and if worsening pain will consider an MRI.  Follow-up with me again 6 to 8 weeks

## 2021-01-24 NOTE — Patient Instructions (Signed)
Good to see you CT chest without contrast to evaluate the small area 2 injections to the left shoulder Exercises for rotator cuff Left shoulder xray If not better in 2 weeks write [place] and we will do MRI of the neck See me again in 4-6 weeks otherwise

## 2021-02-04 NOTE — Progress Notes (Incomplete)
Phone [phone] In person visit   Subjective:   Michael [name] is a 65 y.o. year old very pleasant male patient who presents for/with See problem oriented charting No chief complaint on file.   This visit occurred during the SARS-CoV-2 public health emergency.  Safety protocols were in place, including screening questions prior to the visit, additional usage of staff PPE, and extensive cleaning of exam room while observing appropriate contact time as indicated for disinfecting solutions.   Past Medical History-  Patient Active Problem List   Diagnosis Date Noted  . Left shoulder pain 01/24/2021  . AC (acromioclavicular) arthritis 01/24/2021  . Abnormal chest x-ray 01/24/2021  . Right carpal tunnel syndrome 09/05/2020  . Perianal irritation 06/08/2020  . Ischemic cardiomyopathy 12/05/2019  . Educated about COVID-19 virus infection 12/05/2019  . History of adenomatous polyp of colon 06/07/2018  . BPH (benign prostatic hyperplasia) 06/07/2018  . Vitiligo 06/07/2018  . Gluteal pain 04/10/2018  . Plantar fasciitis 10/12/2017  . Back pain 04/13/2017  . Nonallopathic lesion of thoracic region 04/13/2017  . Nonallopathic lesion of sacral region 04/13/2017  . Nonallopathic lesion of lumbosacral region 04/13/2017  . Impaired glucose tolerance 11/03/2016  . Obesity   . HTN (hypertension)   . CAD S/P multiple PCIs 09/08/2014  . Dyslipidemia 09/08/2014    Medications- reviewed and updated Current Outpatient Medications  Medication Sig Dispense Refill  . aspirin 81 MG tablet Take 81 mg by mouth daily.    [name] atorvastatin (LIPITOR) 80 MG tablet TAKE 1 TABLET DAILY 90 tablet 3  . BRILINTA 90 MG TABS tablet TAKE 1 TABLET TWICE A DAY 180 tablet 3  . CHERRY PO Take 50 mg by mouth daily. [place] extract    . Cinnamon 500 MG capsule Take 500 mg by mouth 2 (two) times daily.     . cyanocobalamin (,VITAMIN B-12,) 1000 MCG/ML injection 1000 mcg (1 mg) injection once per week for four  weeks. 4 mL 3  . cyclobenzaprine (FLEXERIL) 10 MG tablet Take 1 tablet (10 mg total) by mouth 2 (two) times daily as needed for muscle spasms. 20 tablet 0  . hydrocortisone (ANUSOL-HC) 2.5 % rectal cream Place 1 application rectally 2 (two) times daily. 30 g 1  . isosorbide mononitrate (IMDUR) 60 MG 24 hr tablet TAKE 1 TABLET DAILY 90 tablet 3  . nitroGLYCERIN (NITROSTAT) 0.4 MG SL tablet DISSOLVE 1 TABLET UNDER THE TONGUE EVERY 5 MINUTES AS NEEDED FOR CHEST PAIN UP TO 3 DOSES 25 tablet 1  . Prasterone, DHEA, (DHEA 50 PO) Take 1 capsule by mouth daily. Take for 4 weeks, then off for 2 weeks    . Saw Palmetto, Serenoa repens, (SAW PALMETTO BERRY PO) Take 1 tablet by mouth in the morning and at bedtime.    . Turmeric 500 MG CAPS Take 500 mg by mouth daily.     . Vitamin D, Ergocalciferol, (DRISDOL) 1.25 MG (50000 UNIT) CAPS capsule TAKE 1 CAPSULE EVERY 7 DAYS 12 capsule 3   No current facility-administered medications for this visit.     Objective:  There were no vitals taken for this visit. Gen: NAD, resting comfortably CV: RRR no murmurs rubs or gallops Lungs: CTAB no crackles, wheeze, rhonchi Abdomen: soft/nontender/nondistended/normal bowel sounds. No rebound or guarding.  Ext: no edema Skin: warm, dry Neuro: grossly normal, moves all extremities  ***    Assessment and Plan  *** @SPECCOMM @  No problem-specific Assessment & Plan notes found for this encounter.   Recommended follow  up: No follow-ups on file. Future Appointments  Date Time Provider [place]  02/05/2021  8:00 AM [name], MD [facility]  02/08/2021  9:00 AM GI-315 CT 1 GI-315CT [facility]  02/26/2021  8:00 AM [name], DO [facility] None  11/27/2021  8:30 AM Lavonna Monarch, MD [facility]    Lab/Order associations: No diagnosis found.  No orders of the defined types were placed in this encounter.   Time Spent: *** minutes of total time (10:17 AM***- 10:17 AM***) was spent on the  date of the encounter performing the following actions: chart review prior to seeing the patient, obtaining history, performing a medically necessary exam, counseling on the treatment plan, placing orders, and documenting in our EHR.    I,Alexis Bryant,acting as a scribe for Garret Reddish, MD.,have documented all relevant documentation on the behalf of Garret Reddish, MD,as directed by  Garret Reddish, MD while in the presence of Garret Reddish, MD.  *** Return precautions advised.  Lucille Passy

## 2021-02-05 ENCOUNTER — Ambulatory Visit (INDEPENDENT_AMBULATORY_CARE_PROVIDER_SITE_OTHER): Payer: Medicare Other | Admitting: Family Medicine

## 2021-02-05 ENCOUNTER — Encounter: Payer: Self-pay | Admitting: Family Medicine

## 2021-02-05 ENCOUNTER — Other Ambulatory Visit: Payer: Self-pay

## 2021-02-05 VITALS — BP 120/64 | HR 65 | Temp 98.0°F | Ht 67.0 in | Wt 197.4 lb

## 2021-02-05 DIAGNOSIS — I2510 Atherosclerotic heart disease of native coronary artery without angina pectoris: Secondary | ICD-10-CM | POA: Diagnosis not present

## 2021-02-05 DIAGNOSIS — R7302 Impaired glucose tolerance (oral): Secondary | ICD-10-CM

## 2021-02-05 DIAGNOSIS — I255 Ischemic cardiomyopathy: Secondary | ICD-10-CM | POA: Diagnosis not present

## 2021-02-05 DIAGNOSIS — E785 Hyperlipidemia, unspecified: Secondary | ICD-10-CM

## 2021-02-05 DIAGNOSIS — E538 Deficiency of other specified B group vitamins: Secondary | ICD-10-CM

## 2021-02-05 DIAGNOSIS — Z9861 Coronary angioplasty status: Secondary | ICD-10-CM

## 2021-02-05 DIAGNOSIS — I10 Essential (primary) hypertension: Secondary | ICD-10-CM

## 2021-02-05 LAB — COMPREHENSIVE METABOLIC PANEL
ALT: 22 U/L (ref 0–53)
AST: 23 U/L (ref 0–37)
Albumin: 4.1 g/dL (ref 3.5–5.2)
Alkaline Phosphatase: 77 U/L (ref 39–117)
BUN: 26 mg/dL — ABNORMAL HIGH (ref 6–23)
CO2: 32 mEq/L (ref 19–32)
Calcium: 9.4 mg/dL (ref 8.4–10.5)
Chloride: 102 mEq/L (ref 96–112)
Creatinine, Ser: 1.04 mg/dL (ref 0.40–1.50)
GFR: 75.14 mL/min (ref >60.00)
Glucose, Bld: 105 mg/dL — ABNORMAL HIGH (ref 70–99)
Potassium: 4.1 mEq/L (ref 3.5–5.1)
Sodium: 140 mEq/L (ref 135–145)
Total Bilirubin: 1.3 mg/dL — ABNORMAL HIGH (ref 0.2–1.2)
Total Protein: 6.8 g/dL (ref 6.0–8.3)

## 2021-02-05 LAB — VITAMIN B12: Vitamin B-12: 79 pg/mL — ABNORMAL LOW (ref 211–911)

## 2021-02-05 LAB — HEMOGLOBIN A1C: Hgb A1c MFr Bld: 6.3 % (ref 4.6–6.5)

## 2021-02-05 NOTE — Progress Notes (Signed)
Phone [phone] In person visit   Subjective:   Michael [name] is a 65 y.o. year old very pleasant male patient who presents for/with See problem oriented charting Chief Complaint  Patient presents with  . Hypertension  . Hyperlipidemia  . Hyperglycemia   This visit occurred during the SARS-CoV-2 public health emergency.  Safety protocols were in place, including screening questions prior to the visit, additional usage of staff PPE, and extensive cleaning of exam room while observing appropriate contact time as indicated for disinfecting solutions.   Past Medical History-  Patient Active Problem List   Diagnosis Date Noted  . Ischemic cardiomyopathy 12/05/2019    Priority: High  . CAD S/P multiple PCIs 09/08/2014    Priority: High  . History of adenomatous polyp of colon 06/07/2018    Priority: Medium  . BPH (benign prostatic hyperplasia) 06/07/2018    Priority: Medium  . Impaired glucose tolerance 11/03/2016    Priority: Medium  . HTN (hypertension)     Priority: Medium  . Dyslipidemia 09/08/2014    Priority: Medium  . Left shoulder pain 01/24/2021    Priority: Low  . AC (acromioclavicular) arthritis 01/24/2021    Priority: Low  . Vitiligo 06/07/2018    Priority: Low  . Gluteal pain 04/10/2018    Priority: Low  . Plantar fasciitis 10/12/2017    Priority: Low  . Back pain 04/13/2017    Priority: Low  . Nonallopathic lesion of thoracic region 04/13/2017    Priority: Low  . Nonallopathic lesion of sacral region 04/13/2017    Priority: Low  . Nonallopathic lesion of lumbosacral region 04/13/2017    Priority: Low  . Obesity     Priority: Low  . Abnormal chest x-ray 01/24/2021  . Right carpal tunnel syndrome 09/05/2020  . Perianal irritation 06/08/2020    Medications- reviewed and updated Current Outpatient Medications  Medication Sig Dispense Refill  . aspirin 81 MG tablet Take 81 mg by mouth daily.    [name] atorvastatin (LIPITOR) 80 MG tablet TAKE 1 TABLET  DAILY 90 tablet 3  . BRILINTA 90 MG TABS tablet TAKE 1 TABLET TWICE A DAY 180 tablet 3  . CHERRY PO Take 50 mg by mouth daily. [place] extract    . Cinnamon 500 MG capsule Take 500 mg by mouth 2 (two) times daily.     . cyclobenzaprine (FLEXERIL) 10 MG tablet Take 1 tablet (10 mg total) by mouth 2 (two) times daily as needed for muscle spasms. 20 tablet 0  . hydrocortisone (ANUSOL-HC) 2.5 % rectal cream Place 1 application rectally 2 (two) times daily. 30 g 1  . isosorbide mononitrate (IMDUR) 60 MG 24 hr tablet TAKE 1 TABLET DAILY 90 tablet 3  . nitroGLYCERIN (NITROSTAT) 0.4 MG SL tablet DISSOLVE 1 TABLET UNDER THE TONGUE EVERY 5 MINUTES AS NEEDED FOR CHEST PAIN UP TO 3 DOSES 25 tablet 1  . Prasterone, DHEA, (DHEA 50 PO) Take 1 capsule by mouth daily. Take for 4 weeks, then off for 2 weeks    . Saw Palmetto, Serenoa repens, (SAW PALMETTO BERRY PO) Take 1 tablet by mouth in the morning and at bedtime.    . Turmeric 500 MG CAPS Take 500 mg by mouth daily.     . Vitamin D, Ergocalciferol, (DRISDOL) 1.25 MG (50000 UNIT) CAPS capsule TAKE 1 CAPSULE EVERY 7 DAYS 12 capsule 3  . cyanocobalamin (,VITAMIN B-12,) 1000 MCG/ML injection 1000 mcg (1 mg) injection once per week for four weeks. (Patient not taking: Reported on  02/05/2021) 4 mL 3   No current facility-administered medications for this visit.     Objective:  BP 120/64   Pulse 65   Temp 98 F (36.7 C) (Temporal)   Ht 5\' 7"  (1.702 m)   Wt 197 lb 6.4 oz (89.5 kg)   SpO2 99%   BMI 30.92 kg/m  Gen: NAD, resting comfortably CV: RRR no murmurs rubs or gallops Lungs: CTAB no crackles, wheeze, rhonchi Ext: no edema     Assessment and Plan   #Left arm pain/ED visit-patient with recent emergency room visit for left arm pain-thankfully cardiac work-up was reassuring.  Patient had follow-up with Dr. Caffie [name] injection with Dr. Myrene [name] states he is improving.Largely normal chest x-ray- otherwise concern for rounded density upper thoracic  spine on lateral view only-chest CT ordered by Dr. Tamala [name]  #CAD with angina controlled on Imdur #hyperlipidemia-LDL goal under 70 S: Medication:Atorvastatin 80 mg, aspirin 81 mg, Brilinta 90 mg, Imdur 60 mg, nitroglycerin on hand but has not had to use regularly-tried with left arm pain but minimal relief  Current symptoms: No chest pain or shortness of breath.  Had some left arm pain which mimics cardiac pain but thankfully that work-up was reassuring as above A/P: For CAD--largely asymptomatic-continue current medications For hyperlipidemia- was at goal in November-recheck at next visit  #hypertension S: medication: Imdur 60 mg A/P: stable. patient is recommended to continue current routine/medications.  # Hyperglycemia/insulin resistance/prediabetes-peak A1c 6.0 S: Medication: None Exercise and diet- says he is fine and is doing activities with his son. Patient has also cut down on packaged desserts. A/P: Hopefully stable-update A1c with labs today  # B12 deficiency S: Current treatment/medication (oral vs. IM): Patient with low under 200 November 2021-treated with 4 weeks of B12 injections. At present taking no medication-did not refill medicine A/P:  pt took 4 weeks of medcation, we will update B12 level- hopefully improved-may consider oral versus injections depending on level..  Seems like he did fine with home injections  Recommended follow QB:[id number] in about 6 months (around 08/08/2021) for follow up- or sooner if needed. Future Appointments  Date Time Provider [place]  02/08/2021  9:00 AM GI-315 CT 1 GI-315CT GI-315 W. [facility]  02/26/2021  8:00 AM Michael Pulley, DO [facility] None  11/27/2021  8:30 AM Michael Monarch, MD [facility]    Lab/Order associations:   ICD-10-CM   1. CAD S/P multiple PCIs  I25.10    Z98.61   2. Impaired glucose tolerance  R73.02 Hemoglobin A1c  3. Primary hypertension  I10   4. Dyslipidemia  E78.5 Comprehensive metabolic panel  5. B12  deficiency  E53.8 Vitamin B12   I,Michael [name],acting as a scribe for Michael Reddish, MD.,have documented all relevant documentation on the behalf of Michael Reddish, MD,as directed by  Michael Reddish, MD while in the presence of Michael Reddish, MD.   I, Michael Reddish, MD, have reviewed all documentation for this visit. The documentation on 02/05/21 for the exam, diagnosis, procedures, and orders are all accurate and complete.   Return precautions advised.  Michael Reddish, MD

## 2021-02-05 NOTE — Patient Instructions (Addendum)
Please stop by lab before you go If you have mychart- we will send your results within 3 business days of [place] receiving them.  If you do not have mychart- we will call you about results within 5 business days of [place] receiving them.  *please also note that you will see labs on mychart as soon as they post. I will later go in and write notes on them- will say "notes from Dr. Yong Channel"  No changes today unless labs lead [place] to make changes  Certainly hoping for the best with the CT  Recommended follow up: Return in about 6 months (around 08/08/2021) for follow up- or sooner if needed.

## 2021-02-08 ENCOUNTER — Ambulatory Visit
Admission: RE | Admit: 2021-02-08 | Discharge: 2021-02-08 | Disposition: A | Discharge status: Home / Self Care | Payer: Medicare Other | Source: Ambulatory Visit | Attending: Family Medicine | Admitting: Family Medicine

## 2021-02-08 DIAGNOSIS — R079 Chest pain, unspecified: Secondary | ICD-10-CM

## 2021-02-21 NOTE — Progress Notes (Signed)
[facility] [address] [place] Phone: [phone] Subjective:   I [name] am serving as a Education administrator for Dr. Hulan Saas.  This visit occurred during the SARS-CoV-2 public health emergency.  Safety protocols were in place, including screening questions prior to the visit, additional usage of staff PPE, and extensive cleaning of exam room while observing appropriate contact time as indicated for disinfecting solutions.   I'm seeing this patient by the request  of:  [name], MD  CC: right shoulder pain and back pain   [id number]   01/24/2021 Injection given today.  Tolerated the procedure well, discussed icing regimen and home exercises.  Differential includes may be a possibility of cervical radiculopathy and if worsening pain will consider an MRI.  Follow-up with me again 6 to 8 weeks  Patient given injection and tolerated the procedure well, discussed icing regimen and home exercises, discussed which he needs to do which wants to avoid.  Discussed topical anti-inflammatories.  We will see how patient responds.  On ultrasound there is some mild findings of acromioclavicular arthritis as well as the mild partial tearing of the rotator cuff.  Patient will limit the repetitive motions as as well as the heavy lifting.  Follow-up with me again in 6 weeks.  Differential does include the possibility of cervical radiculopathy and if continuing to have discomfort and pain I do feel MRI of the neck with potential epidurals would be next.   Update 02/26/2021 Jerric Oyen is a 65 y.o. male coming in with complaint of L shoulder pain. Patient states he is a lot better. Wants to talk about the right shoulder and states it is related to a job he is doing. Posterior joint pain. History of carpal tunnel. Has tried ice for the pain. States that last week he felt pain and used ice which helped.   Xray 01/24/2021 L  shoulder IMPRESSION: Degenerative change without acute abnormality.    Past Medical History:  Diagnosis Date  . CAD S/P PCI to Cx & RCA    a. s/p PCI of LCx (2006) and ostium of RCA (2007) as well as DES to RCA in [place] (05/2014)  . HTN (hypertension)   . Hyperlipidemia    Past Surgical History:  Procedure Laterality Date  . CARDIAC CATHETERIZATION N/A 05/06/2016   Procedure: Left Heart Cath and Coronary Angiography;  Surgeon: Nelva Bush, MD;  Location: [facility];  Service: Cardiovascular;  Laterality: N/A;  . CARDIAC CATHETERIZATION N/A 05/06/2016   Procedure: Coronary Stent Intervention;  Surgeon: Nelva Bush, MD;  Location: [facility];  Service: Cardiovascular;  Laterality: N/A;  . CARDIAC CATHETERIZATION N/A 05/13/2016   Procedure: Coronary/Graft Angiography;  Surgeon: Peter M Martinique, MD;  Location: [facility];  Service: Cardiovascular;  Laterality: N/A;  . CORONARY ANGIOPLASTY WITH STENT PLACEMENT  2006   in [place], occluded CFX, s/p Taxus stent  . CORONARY ANGIOPLASTY WITH STENT PLACEMENT  2007   in [place], ostial RCA stent  . CORONARY ANGIOPLASTY WITH STENT PLACEMENT  2015   in [place], RCA stent  . TONSILLECTOMY AND ADENOIDECTOMY      Social History   Socioeconomic History  . Marital status: Married    Spouse name: Not on file  . Number of children: 4  . Years of education: Not on file  . Highest education level: Not on file  Occupational History  . Occupation: retired  Tobacco Use  . Smoking  status: Never Smoker  . Smokeless tobacco: Never Used  Vaping Use  . Vaping Use: Never used  Substance and Sexual Activity  . Alcohol use: No    Alcohol/week: 0.0 standard drinks  . Drug use: No  . Sexual activity: Yes  Other Topics Concern  . Not on file  Social History Narrative   Lives with wife.  4 kids total (2 step, 2 biological). 1 that has CP. Daughter [name]- comes to [facility]).        Retired Herbalist.  Oldest daughter has CP      Hobbies: gardening, lawn care, care for grandkids   Social Determinants of Health   Financial Resource Strain: Not on file  Food Insecurity: Not on file  Transportation Needs: Not on file  Physical Activity: Not on file  Stress: Not on file  Social Connections: Not on file   No Known Allergies Family History  Problem Relation Age of Onset  . Hypertension Father   . Dementia Father        Vascular. patient states dementia/possible alzheimers as well  . Hyperlipidemia Sister   . Cancer Brother        type unknown  . Cancer Brother        type unknown  . Heart disease Maternal Grandfather 47       Died probably of heart diseasse  . Heart disease Maternal Uncle 18       Died of "heart exploding"  . Cerebral palsy Daughter   . Stroke Son        severe problems with blood clotting  . Colon cancer Neg Hx   . Esophageal cancer Neg Hx   . Pancreatic cancer Neg Hx   . Stomach cancer Neg Hx   . Liver disease Neg Hx      Current Outpatient Medications (Cardiovascular):  .  atorvastatin (LIPITOR) 80 MG tablet, TAKE 1 TABLET DAILY .  isosorbide mononitrate (IMDUR) 60 MG 24 hr tablet, TAKE 1 TABLET DAILY .  nitroGLYCERIN (NITROSTAT) 0.4 MG SL tablet, DISSOLVE 1 TABLET UNDER THE TONGUE EVERY 5 MINUTES AS NEEDED FOR CHEST PAIN UP TO 3 DOSES   Current Outpatient Medications (Analgesics):  .  aspirin 81 MG tablet, Take 81 mg by mouth daily.  Current Outpatient Medications (Hematological):  [name]  BRILINTA 90 MG TABS tablet, TAKE 1 TABLET TWICE A DAY .  cyanocobalamin (,VITAMIN B-12,) 1000 MCG/ML injection, 1000 mcg (1 mg) injection once per week for four weeks.  Current Outpatient Medications (Other):  [name]  CHERRY PO, Take 50 mg by mouth daily. [place] extract .  Cinnamon 500 MG capsule, Take 500 mg by mouth 2 (two) times daily.  .  cyclobenzaprine (FLEXERIL) 10 MG tablet, Take 1 tablet (10 mg total) by mouth 2 (two) times daily as needed  for muscle spasms. .  hydrocortisone (ANUSOL-HC) 2.5 % rectal cream, Place 1 application rectally 2 (two) times daily. .  Prasterone, DHEA, (DHEA 50 PO), Take 1 capsule by mouth daily. Take for 4 weeks, then off for 2 weeks .  Saw Palmetto, Serenoa repens, (SAW PALMETTO BERRY PO), Take 1 tablet by mouth in the morning and at bedtime. .  Turmeric 500 MG CAPS, Take 500 mg by mouth daily.  .  Vitamin D, Ergocalciferol, (DRISDOL) 1.25 MG (50000 UNIT) CAPS capsule, TAKE 1 CAPSULE EVERY 7 DAYS   Reviewed prior external information including notes and imaging from  primary care provider As well as notes that were available  from care everywhere and other healthcare systems.  Past medical history, social, surgical and family history all reviewed in electronic medical record.  No pertanent information unless stated regarding to the chief complaint.   Review of Systems:  No headache, visual changes, nausea, vomiting, diarrhea, constipation, dizziness, abdominal pain, skin rash, fevers, chills, night sweats, weight loss, swollen lymph nodes, body aches, joint swelling, chest pain, shortness of breath, mood changes. POSITIVE muscle aches  Objective  Blood pressure 120/70, pulse 74, height 5\' 7"  (1.702 m), weight 201 lb (91.2 kg), SpO2 98 %.   General: No apparent distress alert and oriented x3 mood and affect normal, dressed appropriately.  HEENT: Pupils equal, extraocular movements intact  Respiratory: Patient's speak in full sentences and does not appear short of breath  Cardiovascular: No lower extremity edema, non tender, no erythema  Gait normal with good balance and coordination.  MSK:  Right shoulder positive impingement, positive crossover.  Patient does not have any weakness noted.  Patient does have very mild limited active motion with the and crossover.  Low back exam does have some mild loss of lordosis.  Patient does have some tenderness in the thoracolumbar juncture right greater than  left.  Tightness of the right sacroiliac joint also noted.  Negative straight leg test.  Procedure: Real-time Ultrasound Guided Injection of right glenohumeral joint Device: GE Logiq Q7  Ultrasound guided injection is preferred based studies that show increased duration, increased effect, greater accuracy, decreased procedural pain, increased response rate with ultrasound guided versus blind injection.  Verbal informed consent obtained.  Time-out conducted.  Noted no overlying erythema, induration, or other signs of local infection.  Skin prepped in a sterile fashion.  Local anesthesia: Topical Ethyl chloride.  With sterile technique and under real time ultrasound guidance:  Joint visualized.  23g 1  inch needle inserted posterior approach. Pictures taken for needle placement. Patient did have injection of 2 cc of 1% lidocaine, 2 cc of 0.5% Marcaine, and 1.0 cc of Kenalog 40 mg/dL. Completed without difficulty  Pain immediately resolved suggesting accurate placement of the medication.  Advised to call if fevers/chills, erythema, induration, drainage, or persistent bleeding.  Impression: Technically successful ultrasound guided injection.  Procedure: Real-time Ultrasound Guided Injection of right acromioclavicular joint Device: GE Logiq Q7 Ultrasound guided injection is preferred based studies that show increased duration, increased effect, greater accuracy, decreased procedural pain, increased response rate, and decreased cost with ultrasound guided versus blind injection.  Verbal informed consent obtained.  Time-out conducted.  Noted no overlying erythema, induration, or other signs of local infection.  Skin prepped in a sterile fashion.  Local anesthesia: Topical Ethyl chloride.  With sterile technique and under real time ultrasound guidance: With a 25-gauge half inch needle injected with 0.5 cc of 0.5% Marcaine and 0.5 cc of Kenalog 40 mg/mL Completed without difficulty  Pain  immediately resolved suggesting accurate placement of the medication.  Advised to call if fevers/chills, erythema, induration, drainage, or persistent bleeding.  Impression: Technically successful ultrasound guided injection.  Osteopathic findings T7 extended rotated and side bent left L1 flexed rotated and side bent right Sacrum left on left   Impression and Recommendations:     The above documentation has been reviewed and is accurate and complete [name], DO

## 2021-02-26 ENCOUNTER — Ambulatory Visit (INDEPENDENT_AMBULATORY_CARE_PROVIDER_SITE_OTHER): Payer: Medicare Other | Admitting: Family Medicine

## 2021-02-26 ENCOUNTER — Other Ambulatory Visit: Payer: Self-pay

## 2021-02-26 ENCOUNTER — Ambulatory Visit (INDEPENDENT_AMBULATORY_CARE_PROVIDER_SITE_OTHER): Payer: Medicare Other

## 2021-02-26 ENCOUNTER — Encounter: Payer: Self-pay | Admitting: Family Medicine

## 2021-02-26 ENCOUNTER — Ambulatory Visit: Payer: Self-pay

## 2021-02-26 VITALS — BP 120/70 | HR 74 | Ht 67.0 in | Wt 201.0 lb

## 2021-02-26 DIAGNOSIS — M9903 Segmental and somatic dysfunction of lumbar region: Secondary | ICD-10-CM | POA: Diagnosis not present

## 2021-02-26 DIAGNOSIS — I255 Ischemic cardiomyopathy: Secondary | ICD-10-CM

## 2021-02-26 DIAGNOSIS — M25511 Pain in right shoulder: Secondary | ICD-10-CM

## 2021-02-26 DIAGNOSIS — M9902 Segmental and somatic dysfunction of thoracic region: Secondary | ICD-10-CM

## 2021-02-26 DIAGNOSIS — M9904 Segmental and somatic dysfunction of sacral region: Secondary | ICD-10-CM | POA: Diagnosis not present

## 2021-02-26 DIAGNOSIS — L80 Vitiligo: Secondary | ICD-10-CM | POA: Diagnosis not present

## 2021-02-26 DIAGNOSIS — M19012 Primary osteoarthritis, left shoulder: Secondary | ICD-10-CM | POA: Diagnosis not present

## 2021-02-26 DIAGNOSIS — M549 Dorsalgia, unspecified: Secondary | ICD-10-CM

## 2021-02-26 DIAGNOSIS — M19011 Primary osteoarthritis, right shoulder: Secondary | ICD-10-CM

## 2021-02-26 DIAGNOSIS — G8929 Other chronic pain: Secondary | ICD-10-CM

## 2021-02-26 NOTE — Assessment & Plan Note (Signed)
Chronic problem.  Very mild.  Seems to be relatively stable but mild exacerbation since he has been working more.  Patient responds well to manipulation.  Follow-up again in 6 to 8 weeks

## 2021-02-26 NOTE — Assessment & Plan Note (Signed)
Discussed with patient about dermatology.  Patient will try to follow-up with them.

## 2021-02-26 NOTE — Assessment & Plan Note (Signed)
right sided injected today, xray pending, discuss avoid repetitive motion  Discussed HEP  RTC in 6-8 weeks

## 2021-02-26 NOTE — Patient Instructions (Addendum)
Good to see you Xray today 2 injections in the shoulder today Have fun on the trip  I think you deserve a break Continue exercises See me again in 6-8 weeks

## 2021-03-08 ENCOUNTER — Other Ambulatory Visit: Payer: Self-pay

## 2021-03-08 ENCOUNTER — Ambulatory Visit (INDEPENDENT_AMBULATORY_CARE_PROVIDER_SITE_OTHER): Payer: Medicare Other | Admitting: Family

## 2021-03-08 ENCOUNTER — Encounter: Payer: Self-pay | Admitting: Family

## 2021-03-08 VITALS — BP 112/69 | HR 77 | Temp 98.1°F | Ht 67.0 in | Wt 197.2 lb

## 2021-03-08 DIAGNOSIS — I255 Ischemic cardiomyopathy: Secondary | ICD-10-CM

## 2021-03-08 DIAGNOSIS — L239 Allergic contact dermatitis, unspecified cause: Secondary | ICD-10-CM

## 2021-03-08 MED ORDER — PREDNISONE 20 MG PO TABS: 20.0000 mg | ORAL_TABLET | Freq: Every day | ORAL | 0 refills | Status: DC

## 2021-03-08 NOTE — Progress Notes (Signed)
Acute Office Visit  Subjective:    Patient ID: Michael [name], male    DOB: 03-07-1956, 66 y.o.   MRN: [id number]  Chief Complaint  Patient presents with  . itching    All over his body     HPI Patient is in today with complaints of itching and a rash across his chest and back x10 days.  He has been using Benadryl that helps with the itching.  Denies any changes in detergents, soaps, or lotions.  No environmental changes.  No new foods.  Patient reports that he is started B12 and saw palmetto a few days prior to the rash presenting itself but has since discontinued the supplements.  He has a history of vitiligo that has been stable.  Past Medical History:  Diagnosis Date  . CAD S/P PCI to Cx & RCA    a. s/p PCI of LCx (2006) and ostium of RCA (2007) as well as DES to RCA in [place] (05/2014)  . HTN (hypertension)   . Hyperlipidemia     Past Surgical History:  Procedure Laterality Date  . CARDIAC CATHETERIZATION N/A 05/06/2016   Procedure: Left Heart Cath and Coronary Angiography;  Surgeon: Nelva Bush, MD;  Location: [facility];  Service: Cardiovascular;  Laterality: N/A;  . CARDIAC CATHETERIZATION N/A 05/06/2016   Procedure: Coronary Stent Intervention;  Surgeon: Nelva Bush, MD;  Location: [facility];  Service: Cardiovascular;  Laterality: N/A;  . CARDIAC CATHETERIZATION N/A 05/13/2016   Procedure: Coronary/Graft Angiography;  Surgeon: Peter M Martinique, MD;  Location: [facility];  Service: Cardiovascular;  Laterality: N/A;  . CORONARY ANGIOPLASTY WITH STENT PLACEMENT  2006   in [place], occluded CFX, s/p Taxus stent  . CORONARY ANGIOPLASTY WITH STENT PLACEMENT  2007   in [place], ostial RCA stent  . CORONARY ANGIOPLASTY WITH STENT PLACEMENT  2015   in [place], RCA stent  . TONSILLECTOMY AND ADENOIDECTOMY       Family History  Problem Relation Age of Onset  . Hypertension Father   . Dementia Father        Vascular. patient  states dementia/possible alzheimers as well  . Hyperlipidemia Sister   . Cancer Brother        type unknown  . Cancer Brother        type unknown  . Heart disease Maternal Grandfather 49       Died probably of heart diseasse  . Heart disease Maternal Uncle 36       Died of "heart exploding"  . Cerebral palsy Daughter   . Stroke Son        severe problems with blood clotting  . Colon cancer Neg Hx   . Esophageal cancer Neg Hx   . Pancreatic cancer Neg Hx   . Stomach cancer Neg Hx   . Liver disease Neg Hx     Social History   Socioeconomic History  . Marital status: Married    Spouse name: Not on file  . Number of children: 4  . Years of education: Not on file  . Highest education level: Not on file  Occupational History  . Occupation: retired  Tobacco Use  . Smoking status: Never  . Smokeless tobacco: Never  Vaping Use  . Vaping Use: Never used  Substance and Sexual Activity  . Alcohol use: No    Alcohol/week: 0.0 standard drinks  . Drug use: No  . Sexual activity: Yes  Other Topics Concern  .  Not on file  Social History Narrative   Lives with wife.  4 kids total (2 step, 2 biological). 1 that has CP. Daughter [name]- comes to [facility]).        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 aspirin 81 MG tablet Take 81 mg by mouth daily.    [name] atorvastatin (LIPITOR) 80 MG tablet TAKE 1 TABLET DAILY 90 tablet 3  . BRILINTA 90 MG TABS tablet TAKE 1 TABLET TWICE A DAY 180 tablet 3  . cyclobenzaprine (FLEXERIL) 10 MG tablet Take 1 tablet (10 mg total) by  mouth 2 (two) times daily as needed for muscle spasms. 20 tablet 0  . hydrocortisone (ANUSOL-HC) 2.5 % rectal cream Place 1 application rectally 2 (two) times daily. 30 g 1  . isosorbide mononitrate (IMDUR) 60 MG 24 hr tablet TAKE 1 TABLET DAILY 90 tablet 3  . nitroGLYCERIN (NITROSTAT) 0.4 MG SL tablet DISSOLVE 1 TABLET UNDER THE TONGUE EVERY 5 MINUTES AS NEEDED FOR CHEST PAIN UP TO 3 DOSES 25 tablet 1  . Vitamin D, Ergocalciferol, (DRISDOL) 1.25 MG (50000 UNIT) CAPS capsule TAKE 1 CAPSULE EVERY 7 DAYS 12 capsule 3  . CHERRY PO Take 50 mg by mouth daily. Cherry extract (Patient not taking: Reported on 03/08/2021)    . Cinnamon 500 MG capsule Take 500 mg by mouth 2 (two) times daily.  (Patient not taking: Reported on 03/08/2021)    . Prasterone, DHEA, (DHEA 50 PO) Take 1 capsule by mouth daily. Take for 4 weeks, then off for 2 weeks (Patient not taking: Reported on 03/08/2021)    . Saw Palmetto, Serenoa repens, (SAW PALMETTO BERRY PO) Take 1 tablet by mouth in the morning and at bedtime. (Patient not taking: Reported on 03/08/2021)    . Turmeric 500 MG CAPS Take 500 mg by mouth daily.  (Patient not taking: Reported on 03/08/2021)    . cyanocobalamin (,VITAMIN B-12,) 1000 MCG/ML injection 1000 mcg (1 mg) injection once per week for four weeks. (Patient not taking: Reported on 03/08/2021) 4 mL 3   No facility-administered medications prior to visit.    No Known Allergies  Review of Systems  Constitutional: Negative.   HENT: Negative.    Respiratory: Negative.    Cardiovascular: Negative.   Musculoskeletal: Negative.   Skin:  Positive for rash.       Itching  Allergic/Immunologic: Negative for environmental allergies and food allergies.  Neurological: Negative.   Hematological:  Bruises/bleeds easily.  Psychiatric/Behavioral: Negative.        Objective:    Physical Exam Vitals and nursing note reviewed.  Constitutional:      Appearance: Normal appearance.  Cardiovascular:     Rate and  Rhythm: Normal rate and regular rhythm.     Pulses: Normal pulses.     Heart sounds: Normal heart sounds.  Pulmonary:     Breath sounds: Normal breath sounds.  Musculoskeletal:     Cervical back: Normal range of motion and neck supple.  Skin:    Findings: Rash present.  Comments: Dry, red, excoriated rash across the chest and back.  Fine papular lesions.  Neurological:     General: No focal deficit present.     Mental Status: He is alert and oriented to person, place, and time.  Psychiatric:        Mood and Affect: Mood normal.        Behavior: Behavior normal.   BP 112/69   Pulse 77   Temp 98.1 F (36.7 C) (Temporal)   Ht 5\' 7"  (1.702 m)   Wt 197 lb 3.2 oz (89.4 kg)   SpO2 99%   BMI 30.89 kg/m  Wt Readings from Last 3 Encounters:  03/08/21 197 lb 3.2 oz (89.4 kg)  02/26/21 201 lb (91.2 kg)  02/05/21 197 lb 6.4 oz (89.5 kg)    There are no preventive care reminders to display for this patient.  There are no preventive care reminders to display for this patient.   Lab Results  Component Value Date   TSH 2.14 08/03/2020   Lab Results  Component Value Date   WBC 6.9 01/19/2021   HGB 14.0 01/19/2021   HCT 39.8 01/19/2021   MCV 91.1 01/19/2021   PLT 214 01/19/2021   Lab Results  Component Value Date   NA 140 02/05/2021   K 4.1 02/05/2021   CO2 32 02/05/2021   GLUCOSE 105 (H) 02/05/2021   BUN 26 (H) 02/05/2021   CREATININE 1.04 02/05/2021   BILITOT 1.3 (H) 02/05/2021   ALKPHOS 77 02/05/2021   AST 23 02/05/2021   ALT 22 02/05/2021   PROT 6.8 02/05/2021   ALBUMIN 4.1 02/05/2021   CALCIUM 9.4 02/05/2021   ANIONGAP 9 01/19/2021   GFR 75.14 02/05/2021   Lab Results  Component Value Date   CHOL 117 08/03/2020   Lab Results  Component Value Date   HDL 43 08/03/2020   Lab Results  Component Value Date   LDLCALC 54 08/03/2020   Lab Results  Component Value Date   TRIG 117 08/03/2020   Lab Results  Component Value Date   CHOLHDL 2.7 08/03/2020    Lab Results  Component Value Date   HGBA1C 6.3 02/05/2021       Assessment & Plan:   Lonzie was seen today for itching.  Diagnoses and all orders for this visit:  Allergic contact dermatitis, unspecified trigger  Other orders -     predniSONE (DELTASONE) 20 MG tablet; Take 1-2 tablets (20-40 mg total) by mouth daily with breakfast. 2 tabs daily x 4 days, 1 tab daily x 4 days.     Encourage patient to keep a diary of anything that is new, different or changed.  Prednisone as discussed.  Call the office if symptoms worsen or persist.  Keep appointment with allergist as scheduled.   Kennyth Arnold, FNP

## 2021-03-14 NOTE — Progress Notes (Signed)
  [place] [address] [place] Phone: [phone] Subjective:   I Michael [name] am serving as a Education administrator for Dr. Hulan Saas.  This visit occurred during the SARS-CoV-2 public health emergency.  Safety protocols were in place, including screening questions prior to the visit, additional usage of staff PPE, and extensive cleaning of exam room while observing appropriate contact time as indicated for disinfecting solutions.   I'm seeing this patient by the request  of:  [name], MD  CC: Low back pain follow-up, shoulder pain and arm pain  [id number]  Michael [name] is a 65 y.o. male coming in with complaint of back and neck pain. OMT 02/26/2021. R AC and GHJ injections last visit as well. Patient states the shoulder is ok. Beginning of last week he states he started getting weakness and pain in the right forearm. Hand will stay cold. Has been using ice and tylenol. With activity in the right arm is when he starts to get worse. Back is doing well.   Medications patient has been prescribed: Gabapentin  Taking: Very intermittent         Reviewed prior external information including notes and imaging from previsou exam, outside providers and external EMR if available.   As well as notes that were available from care everywhere and other healthcare systems.  Past medical history, social, surgical and family history all reviewed in electronic medical record.  No pertanent information unless stated regarding to the chief complaint.   Past Medical History:  Diagnosis Date   CAD S/P PCI to Cx & RCA    a. s/p PCI of LCx (2006) and ostium of RCA (2007) as well as DES to RCA in [place] (05/2014)   HTN (hypertension)    Hyperlipidemia     No Known Allergies   Review of Systems:  No headache, visual changes, nausea, vomiting, diarrhea, constipation, dizziness, abdominal pain, skin rash, fevers, chills, night sweats,  weight loss, swollen lymph nodes, joint swelling, chest pain, shortness of breath, mood changes. POSITIVE muscle aches, body aches  Objective  Blood pressure 126/78, pulse 85, height 5\' 7"  (1.702 m), weight 199 lb (90.3 kg), SpO2 97 %.   General: No apparent distress alert and oriented x3 mood and affect normal, dressed appropriately.  HEENT: Pupils equal, extraocular movements intact  Respiratory: Patient's speak in full sentences and does not appear short of breath  Cardiovascular: No lower extremity edema, non tender, no erythema  Right shoulder exam has significant improvement in range of motion from previous exam.  Limited range of motion of the neck especially right-sided sidebending.  Negative though Spurling's.  Patient does have mild crepitus noted.  Lacks last 5 degrees of extension Negative Tinel's at the cubital tunnel.  5 out of 5 strength noted with the C5.  C8 distribution.  Very mild tenderness over the forearm.      Assessment and Plan:          The above documentation has been reviewed and is accurate and complete Michael Pulley, DO       Note: This dictation was prepared with Dragon dictation along with smaller phrase technology. Any transcriptional errors that result from this process are unintentional.

## 2021-03-15 ENCOUNTER — Ambulatory Visit (INDEPENDENT_AMBULATORY_CARE_PROVIDER_SITE_OTHER): Payer: Medicare Other | Admitting: Family Medicine

## 2021-03-15 ENCOUNTER — Other Ambulatory Visit: Payer: Self-pay

## 2021-03-15 ENCOUNTER — Encounter: Payer: Self-pay | Admitting: Family Medicine

## 2021-03-15 VITALS — BP 126/78 | HR 85 | Ht 67.0 in | Wt 199.0 lb

## 2021-03-15 DIAGNOSIS — M79601 Pain in right arm: Secondary | ICD-10-CM

## 2021-03-15 DIAGNOSIS — M5010 Cervical disc disorder with radiculopathy, unspecified cervical region: Secondary | ICD-10-CM

## 2021-03-15 DIAGNOSIS — G5601 Carpal tunnel syndrome, right upper limb: Secondary | ICD-10-CM | POA: Diagnosis not present

## 2021-03-15 DIAGNOSIS — I255 Ischemic cardiomyopathy: Secondary | ICD-10-CM | POA: Diagnosis not present

## 2021-03-15 MED ORDER — GABAPENTIN 100 MG PO CAPS: 200.0000 mg | ORAL_CAPSULE | Freq: Every day | ORAL | 3 refills | Status: DC

## 2021-03-15 NOTE — Assessment & Plan Note (Signed)
Cervical radiculopathy.  Discussed with patient that there is concern for this.  Nerve conduction study is ordered.  We discussed taking gabapentin regularly.  Having more pain on the ulnar aspect but good grip strength noted.  Patient does have more forearm aspect as well that could be more of the radial nerve which would be C7 distribution but full strength noted.  Patient will follow-up after studies to further evaluate.

## 2021-03-15 NOTE — Assessment & Plan Note (Signed)
Patient has had carpal tunnel before the patient is having more of the ulnar aspect.  Concern for this being a possibility.  Cervical radiculopathy is a possibility as well.  X-rays previously demonstrated foraminal narrowing on the right side from C3-C7.  We will monitor.  We discussed taking the gabapentin on a regular basis.  We will get a nerve conduction study to further evaluate where any neurologic Compromise could be occurring.  Do not feel that this is secondary to the injections in the shoulder with patient now having improvement in range of motion as well as strength of the shoulder as well as if any injury would occur usually in the C5-C6 distribution.  Also patient has no significant weakness at all and does have full strength at the moment.  Follow-up with me again as scheduled for his other adjustment for the back as well as discussing the shoulder pain.  The radicular symptoms.

## 2021-03-15 NOTE — Patient Instructions (Addendum)
Good to see you Refilled gabapentin take regularly at night Right upper extremity nerve conduction  Keep other appointment for manipulation If things worsen call us or seek medical attention See me again as scheduled

## 2021-03-18 ENCOUNTER — Telehealth: Payer: Self-pay

## 2021-03-18 ENCOUNTER — Other Ambulatory Visit: Payer: Self-pay | Admitting: Family

## 2021-03-18 DIAGNOSIS — L239 Allergic contact dermatitis, unspecified cause: Secondary | ICD-10-CM

## 2021-03-18 MED ORDER — PREDNISONE 20 MG PO TABS
ORAL_TABLET | ORAL | 0 refills | Status: DC
Start: 2021-03-18 — End: 2021-05-08

## 2021-03-18 NOTE — Telephone Encounter (Signed)
Called and spoke with pt, pt made aware and referral to derm has been placed.

## 2021-03-18 NOTE — Telephone Encounter (Signed)
I sent in a lower dose but longer taper of prednisone.  You may refer to dermatology under rash.  If symptoms worsen or if he has new or recurrent symptoms please let us know-would need to see him again

## 2021-03-18 NOTE — Telephone Encounter (Signed)
Pt called needing a refill on prednisone. He stated that he was out and he had a few questions if someone could call him. The phone number is [phone]

## 2021-03-18 NOTE — Telephone Encounter (Signed)
Called and spoke with pt and he states he saw [place] on 06/17 and she gave him prednisone for his itiching, he is awaiting an appointment for dermatology and would like to know if he can have a refill on Prednisone?

## 2021-03-21 ENCOUNTER — Other Ambulatory Visit: Payer: Self-pay

## 2021-03-21 DIAGNOSIS — R202 Paresthesia of skin: Secondary | ICD-10-CM

## 2021-03-22 ENCOUNTER — Other Ambulatory Visit: Payer: Self-pay | Admitting: Physician Assistant

## 2021-04-03 NOTE — Progress Notes (Signed)
[place] [address] [place] Phone: [phone] Subjective:   I Michael [name] am serving as a Education administrator for Dr. Hulan Saas.  This visit occurred during the SARS-CoV-2 public health emergency.  Safety protocols were in place, including screening questions prior to the visit, additional usage of staff PPE, and extensive cleaning of exam room while observing appropriate contact time as indicated for disinfecting solutions.   I'm seeing this patient by the request  of:  [name], MD  CC: Patient does have neck pain with mild radicular symptoms  [id number]  03/15/2021 Cervical radiculopathy.  Discussed with patient that there is concern for this.  Nerve conduction study is ordered.  We discussed taking gabapentin regularly.  Having more pain on the ulnar aspect but good grip strength noted.  Patient does have more forearm aspect as well that could be more of the radial nerve which would be C7 distribution but full strength noted.  Patient will follow-up after studies to further evaluate.  Patient has had carpal tunnel before the patient is having more of the ulnar aspect.  Concern for this being a possibility.  Cervical radiculopathy is a possibility as well.  X-rays previously demonstrated foraminal narrowing on the right side from C3-C7.  We will monitor.  We discussed taking the gabapentin on a regular basis.  We will get a nerve conduction study to further evaluate where any neurologic Compromise could be occurring.  Do not feel that this is secondary to the injections in the shoulder with patient now having improvement in range of motion as well as strength of the shoulder as well as if any injury would occur usually in the C5-C6 distribution.  Also patient has no significant weakness at all and does have full strength at the moment.  Follow-up with me again as scheduled for his other adjustment for the back as well as discussing  the  Update 04/12/2021 Michael [name] is a 65 y.o. male coming in with complaint of R carpal tunnel syndrome. Patient states he had acupuncture so he is feeling better. State she is still not sure if the pain was carpal tunnel or something else.  Patient states that he is making some improvement but continues to have intermittent pain.  Patient stopped making cookies which was helped        Past Medical History:  Diagnosis Date   CAD S/P PCI to Cx & RCA    a. s/p PCI of LCx (2006) and ostium of RCA (2007) as well as DES to RCA in [place] (05/2014)   HTN (hypertension)    Hyperlipidemia    Past Surgical History:  Procedure Laterality Date   CARDIAC CATHETERIZATION N/A 05/06/2016   Procedure: Left Heart Cath and Coronary Angiography;  Surgeon: Nelva Bush, MD;  Location: [facility];  Service: Cardiovascular;  Laterality: N/A;   CARDIAC CATHETERIZATION N/A 05/06/2016   Procedure: Coronary Stent Intervention;  Surgeon: Nelva Bush, MD;  Location: [facility];  Service: Cardiovascular;  Laterality: N/A;   CARDIAC CATHETERIZATION N/A 05/13/2016   Procedure: Coronary/Graft Angiography;  Surgeon: Peter M Martinique, MD;  Location: [facility];  Service: Cardiovascular;  Laterality: N/A;   CORONARY ANGIOPLASTY WITH STENT PLACEMENT  2006   in [place], occluded CFX, s/p Taxus stent   CORONARY ANGIOPLASTY WITH STENT PLACEMENT  2007   in [place], ostial RCA stent   CORONARY ANGIOPLASTY WITH STENT PLACEMENT  2015   in [place],  RCA stent   TONSILLECTOMY AND ADENOIDECTOMY      Social History   Socioeconomic History   Marital status: Married    Spouse name: Not on file   Number of children: 4   Years of education: Not on file   Highest education level: Not on file  Occupational History   Occupation: retired  Tobacco Use   Smoking status: Never   Smokeless tobacco: Never  Vaping Use   Vaping Use: Never used  Substance and Sexual Activity    Alcohol use: No    Alcohol/week: 0.0 standard drinks   Drug use: No   Sexual activity: Yes  Other Topics Concern   Not on file  Social History Narrative   Lives with wife.  4 kids total (2 step, 2 biological). 1 that has CP. Daughter [name]- comes to [facility]).        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 patient states dementia/possible alzheimers as well   Hyperlipidemia Sister    Cancer Brother        type unknown   Cancer Brother        type unknown   Heart disease Maternal Grandfather 41       Died probably of heart diseasse   Heart disease Maternal Uncle 33       Died of "heart exploding"   Cerebral palsy Daughter    Stroke Son        severe problems with blood clotting   Colon cancer Neg Hx    Esophageal cancer Neg Hx    Pancreatic cancer Neg Hx    Stomach cancer Neg Hx    Liver disease Neg Hx     Current Outpatient Medications (Endocrine & Metabolic):    predniSONE (DELTASONE) 20 MG tablet, Take 1-2 tablets (20-40 mg total) by mouth daily with breakfast. 2 tabs daily x 4 days, 1 tab daily x 4 days.   predniSONE (DELTASONE) 20 MG tablet, 1 pill for 6 days, 1/2 pill for 4 days, 1/2 pill every other day until finished  Current Outpatient Medications (Cardiovascular):    atorvastatin (LIPITOR) 80 MG tablet, TAKE 1 TABLET DAILY   isosorbide mononitrate (IMDUR) 60 MG 24 hr tablet, TAKE 1 TABLET DAILY   nitroGLYCERIN (NITROSTAT) 0.4 MG SL tablet, DISSOLVE 1 TABLET UNDER THE TONGUE EVERY 5 MINUTES AS NEEDED FOR CHEST PAIN UP TO 3  DOSES   Current Outpatient Medications (Analgesics):    aspirin 81 MG tablet, Take 81 mg by mouth daily.  Current Outpatient Medications (Hematological):    BRILINTA 90 MG TABS tablet, TAKE 1 TABLET TWICE A DAY  Current Outpatient Medications (Other):    CHERRY PO, Take 50 mg by mouth daily. Cherry extract   Cinnamon 500 MG capsule, Take 500 mg by mouth 2 (two) times daily.   cyclobenzaprine (FLEXERIL) 10 MG tablet, Take 1 tablet (10 mg total) by mouth 2 (two) times daily as needed for muscle spasms.   gabapentin (NEURONTIN) 100 MG capsule,  Take 2 capsules (200 mg total) by mouth at bedtime.   hydrocortisone (ANUSOL-HC) 2.5 % rectal cream, Place 1 application rectally 2 (two) times daily.   Prasterone, DHEA, (DHEA 50 PO), Take 1 capsule by mouth daily. Take for 4 weeks, then off for 2 weeks   Saw Palmetto, Serenoa repens, (SAW PALMETTO BERRY PO), Take 1 tablet by mouth in the morning and at bedtime.   Turmeric 500 MG CAPS, Take 500 mg by mouth daily.   Vitamin D, Ergocalciferol, (DRISDOL) 1.25 MG (50000 UNIT) CAPS capsule, TAKE 1 CAPSULE EVERY 7 DAYS   Reviewed prior external information including notes and imaging from  primary care provider As well as notes that were available from care everywhere and other healthcare systems.  Past medical history, social, surgical and family history all reviewed in electronic medical record.  No pertanent information unless stated regarding to the chief complaint.   Review of Systems:  No headache, visual changes, nausea, vomiting, diarrhea, constipation, dizziness, abdominal pain, skin rash, fevers, chills, night sweats, weight loss, swollen lymph nodes, body aches, joint swelling, chest pain, shortness of breath, mood changes. POSITIVE muscle aches  Objective  Blood pressure 118/72, pulse 81, height 5\' 7"  (1.702 m), weight 201 lb (91.2 kg), SpO2 97 %.   General: No apparent distress alert and oriented x3 mood and affect normal, dressed  appropriately.  HEENT: Pupils equal, extraocular movements intact  Respiratory: Patient's speak in full sentences and does not appear short of breath  Cardiovascular: No lower extremity edema, non tender, no erythema  Gait normal with good balance and coordination.  MSK: Neck exam does have some loss of lordosis.  Some tenderness to palpation in the paraspinal musculature of the lumbar spine minorly on the right side.  Neck exam still very mild decrease in sidebending and rotation to the left.  Very uncomfortable with Spurling's on the left but no true radicular symptoms.  5-5 strength noted.  Mild positive Tinel's at the wrist  Osteopathic findings C2 flexed rotated and side bent right C4 flexed rotated and side bent left T7 extended rotated and side bent left L2 flexed rotated and side bent right Sacrum right on right     Impression and Recommendations:     The above documentation has been reviewed and is accurate and complete [name], DO

## 2021-04-12 ENCOUNTER — Other Ambulatory Visit: Payer: Self-pay

## 2021-04-12 ENCOUNTER — Encounter: Payer: Self-pay | Admitting: Family Medicine

## 2021-04-12 ENCOUNTER — Ambulatory Visit (INDEPENDENT_AMBULATORY_CARE_PROVIDER_SITE_OTHER): Payer: Medicare Other | Admitting: Family Medicine

## 2021-04-12 VITALS — BP 118/72 | HR 81 | Ht 67.0 in | Wt 201.0 lb

## 2021-04-12 DIAGNOSIS — I255 Ischemic cardiomyopathy: Secondary | ICD-10-CM

## 2021-04-12 DIAGNOSIS — M5010 Cervical disc disorder with radiculopathy, unspecified cervical region: Secondary | ICD-10-CM | POA: Diagnosis not present

## 2021-04-12 DIAGNOSIS — M9902 Segmental and somatic dysfunction of thoracic region: Secondary | ICD-10-CM | POA: Diagnosis not present

## 2021-04-12 DIAGNOSIS — M9901 Segmental and somatic dysfunction of cervical region: Secondary | ICD-10-CM

## 2021-04-12 DIAGNOSIS — M9904 Segmental and somatic dysfunction of sacral region: Secondary | ICD-10-CM | POA: Diagnosis not present

## 2021-04-12 DIAGNOSIS — M999 Biomechanical lesion, unspecified: Secondary | ICD-10-CM

## 2021-04-12 DIAGNOSIS — G5601 Carpal tunnel syndrome, right upper limb: Secondary | ICD-10-CM | POA: Diagnosis not present

## 2021-04-12 DIAGNOSIS — M9903 Segmental and somatic dysfunction of lumbar region: Secondary | ICD-10-CM

## 2021-04-12 NOTE — Assessment & Plan Note (Signed)
   Decision today to treat with OMT was based on Physical Exam  After verbal consent patient was treated with  ME, FPR techniques in cervical, thoracic,  lumbar and sacral areas, all areas are chronic only did not muscle energy secondary to the intermittent radicular symptoms. Patient tolerated the procedure well with improvement in symptoms  Patient given exercises, stretches and lifestyle modifications  See medications in patient instructions if given  Patient will follow up in 4-8 weeks

## 2021-04-12 NOTE — Assessment & Plan Note (Signed)
Patient still having intermittent radicular symptoms.  Did more muscle energy on the neck.  Have any significant weakness noted.  We are awaiting the nerve conduction study and we will see what this shows.  Follow-up with me again in 6 to 8 weeks.  Patient knows if worsening symptoms to call us or seek medical attention immediately.  Does have muscle relaxers for breakthrough.

## 2021-04-12 NOTE — Patient Instructions (Addendum)
Good to see you Glad you are feeling better  Get the nerve conduction study See me again in 6-8 weeks

## 2021-04-12 NOTE — Assessment & Plan Note (Signed)
Continue to monitor.  Patient declined injection for diagnostic and therapeutic purposes.  Patient is awaiting the nerve conduction study

## 2021-04-22 ENCOUNTER — Other Ambulatory Visit: Payer: Self-pay | Admitting: Physician Assistant

## 2021-05-01 ENCOUNTER — Ambulatory Visit (INDEPENDENT_AMBULATORY_CARE_PROVIDER_SITE_OTHER): Payer: Medicare Other | Admitting: Neurology

## 2021-05-01 ENCOUNTER — Other Ambulatory Visit: Payer: Self-pay

## 2021-05-01 DIAGNOSIS — R202 Paresthesia of skin: Secondary | ICD-10-CM

## 2021-05-01 DIAGNOSIS — G5621 Lesion of ulnar nerve, right upper limb: Secondary | ICD-10-CM

## 2021-05-01 NOTE — Procedures (Signed)
[facility]  [address] [place]  [place], [place] [address] Tel: [phone] Fax:  [phone] Test Date:  05/01/2021  Patient: Michael [name] DOB: 01-Jan-1956 Physician: Narda Amber, DO  Sex: Male Height: '5\' 7"'$  Ref Phys: Hulan Saas, DO  ID#: [id number]   Technician:    Patient Complaints: This is a 65 year old man referred for evaluation of right hand pain and paresthesias.  NCV & EMG Findings: Extensive electrodiagnostic testing of the right upper extremity shows:  Right median, ulnar, and mixed palmar sensory responses are within normal limits. Right median motor response is within normal limits.  Right ulnar motor response shows slowed conduction velocity across the elbow (A Elbow-B Elbow, 40 m/s).  There is no evidence of active or chronic motor axonal loss changes affecting any of the tested muscles.  Motor unit configuration and recruitment pattern is within normal limits.    Impression: Right ulnar neuropathy with slowing across the elbow, purely demyelinating, mild. There is no evidence of carpal tunnel syndrome or cervical radiculopathy affecting the right upper extremity   ___________________________ Narda Amber, DO    Nerve Conduction Studies Anti Sensory Summary Table   Stim Site NR Peak (ms) Norm Peak (ms) P-T Amp (V) Norm P-T Amp  Right Median Anti Sensory (2nd Digit)  35C  Wrist    2.7 <3.8 25.2 >10  Right Ulnar Anti Sensory (5th Digit)  35C  Wrist    2.8 <3.2 15.4 >5   Motor Summary Table   Stim Site NR Onset (ms) Norm Onset (ms) O-P Amp (mV) Norm O-P Amp Site1 Site2 Delta-0 (ms) Dist (cm) Vel (m/s) Norm Vel (m/s)  Right Median Motor (Abd Poll Brev)  35C  Wrist    3.0 <4.0 8.7 >5 Elbow Wrist 4.5 29.0 64 >50  Elbow    7.5  8.0         Right Ulnar Motor (Abd Dig Minimi)  35C  Wrist    2.5 <3.1 11.1 >7 B Elbow Wrist 3.6 23.0 64 >50  B Elbow    6.1  10.6  A Elbow B Elbow 2.5 10.0 40 >50  A Elbow    8.6  9.6           Comparison Summary Table   Stim Site NR Peak (ms) Norm Peak (ms) P-T Amp (V) Site1 Site2 Delta-P (ms) Norm Delta (ms)  Right Median/Ulnar Palm Comparison (Wrist - 8cm)  35C  Median Palm    1.6 <2.2 73.8 Median Palm Ulnar Palm 0.0   Ulnar Palm    1.6 <2.2 27.1       EMG   Side Muscle Ins Act Fibs Psw Fasc Number Recrt Dur Dur. Amp Amp. Poly Poly. Comment  Right 1stDorInt Nml Nml Nml Nml Nml Nml Nml Nml Nml Nml Nml Nml N/A  Right PronatorTeres Nml Nml Nml Nml Nml Nml Nml Nml Nml Nml Nml Nml N/A  Right Biceps Nml Nml Nml Nml Nml Nml Nml Nml Nml Nml Nml Nml N/A  Right Triceps Nml Nml Nml Nml Nml Nml Nml Nml Nml Nml Nml Nml N/A  Right Deltoid Nml Nml Nml Nml Nml Nml Nml Nml Nml Nml Nml Nml N/A  Right ABD Dig Min Nml Nml Nml Nml Nml Nml Nml Nml Nml Nml Nml Nml N/A  Right FlexCarpiUln Nml Nml Nml Nml Nml Nml Nml Nml Nml Nml Nml Nml N/A      Waveforms:

## 2021-05-02 ENCOUNTER — Telehealth: Payer: Medicare Other | Admitting: Family Medicine

## 2021-05-08 ENCOUNTER — Other Ambulatory Visit: Payer: Self-pay

## 2021-05-08 ENCOUNTER — Ambulatory Visit (INDEPENDENT_AMBULATORY_CARE_PROVIDER_SITE_OTHER): Payer: Medicare Other | Admitting: Physician Assistant

## 2021-05-08 ENCOUNTER — Encounter: Payer: Self-pay | Admitting: Physician Assistant

## 2021-05-08 VITALS — BP 102/64 | HR 80 | Temp 98.3°F | Ht 67.0 in | Wt 201.0 lb

## 2021-05-08 DIAGNOSIS — H6690 Otitis media, unspecified, unspecified ear: Secondary | ICD-10-CM

## 2021-05-08 MED ORDER — AMOXICILLIN-POT CLAVULANATE 875-125 MG PO TABS: 1.0000 | ORAL_TABLET | Freq: Two times a day (BID) | ORAL | 0 refills | Status: DC

## 2021-05-08 MED ORDER — PREDNISONE 20 MG PO TABS: 40.0000 mg | ORAL_TABLET | Freq: Every day | ORAL | 0 refills | Status: DC

## 2021-05-08 NOTE — Progress Notes (Signed)
Michael [name] is a 64 y.o. male here for a follow up of a pre-existing problem.  I acted as a Education administrator for [employer], PA-C Anselmo Pickler, LPN   History of Present Illness:   Chief Complaint  Patient presents with   Otalgia    HPI  Otalgia Pt c/o right ear infection, went to [facility] on 05/02/2021 was treated with Amoxicillin x 7 days. He has been taking as as prescribed. He is currently still on medication. Pt c/o of ear is still clogged, having pressure off and on but overall pain is relieved.  Denies: fevers, chills, n/v/d   Past Medical History:  Diagnosis Date   CAD S/P PCI to Cx & RCA    a. s/p PCI of LCx (2006) and ostium of RCA (2007) as well as DES to RCA in [place] (05/2014)   HTN (hypertension)    Hyperlipidemia      Social History   Tobacco Use   Smoking status: Never   Smokeless tobacco: Never  Vaping Use   Vaping Use: Never used  Substance Use Topics   Alcohol use: No    Alcohol/week: 0.0 standard drinks   Drug use: No    Past Surgical History:  Procedure Laterality Date   CARDIAC CATHETERIZATION N/A 05/06/2016   Procedure: Left Heart Cath and Coronary Angiography;  Surgeon: Nelva Bush, MD;  Location: [facility];  Service: Cardiovascular;  Laterality: N/A;   CARDIAC CATHETERIZATION N/A 05/06/2016   Procedure: Coronary Stent Intervention;  Surgeon: Nelva Bush, MD;  Location: [facility];  Service: Cardiovascular;  Laterality: N/A;   CARDIAC CATHETERIZATION N/A 05/13/2016   Procedure: Coronary/Graft Angiography;  Surgeon: Peter M Martinique, MD;  Location: [facility];  Service: Cardiovascular;  Laterality: N/A;   CORONARY ANGIOPLASTY WITH STENT PLACEMENT  2006   in [place], occluded CFX, s/p Taxus stent   CORONARY ANGIOPLASTY WITH STENT PLACEMENT  2007   in [place], ostial RCA stent   CORONARY ANGIOPLASTY WITH STENT PLACEMENT  2015   in [place], RCA stent   TONSILLECTOMY AND ADENOIDECTOMY        Family History  Problem Relation Age of Onset   Hypertension Father    Dementia Father        Vascular. patient states dementia/possible alzheimers as well   Hyperlipidemia Sister    Cancer Brother        type unknown   Cancer Brother        type unknown   Heart disease Maternal Grandfather 12       Died probably of heart diseasse   Heart disease Maternal Uncle 79       Died of "heart exploding"   Cerebral palsy Daughter    Stroke Son        severe problems with blood clotting   Colon cancer Neg Hx    Esophageal cancer Neg Hx    Pancreatic cancer Neg Hx    Stomach cancer Neg Hx    Liver disease Neg Hx     No Known Allergies  Current Medications:   Current Outpatient Medications:    amoxicillin-clavulanate (AUGMENTIN) 875-125 MG tablet, Take 1 tablet by mouth 2 (two) times daily., Disp: 20 tablet, Rfl: 0   aspirin 81 MG tablet, Take 81 mg by mouth daily., Disp: , Rfl:    atorvastatin (LIPITOR) 80 MG tablet, TAKE 1 TABLET DAILY, Disp: 90 tablet, Rfl: 3   BRILINTA 90 MG TABS tablet, TAKE 1 TABLET TWICE A DAY,  Disp: 180 tablet, Rfl: 3   CHERRY PO, Take 50 mg by mouth daily. Cherry extract, Disp: , Rfl:    cyclobenzaprine (FLEXERIL) 10 MG tablet, Take 1 tablet (10 mg total) by mouth 2 (two) times daily as needed for muscle spasms., Disp: 20 tablet, Rfl: 0   hydrocortisone (ANUSOL-HC) 2.5 % rectal cream, Place 1 application rectally 2 (two) times daily., Disp: 30 g, Rfl: 1   isosorbide mononitrate (IMDUR) 60 MG 24 hr tablet, TAKE 1 TABLET DAILY, Disp: 90 tablet, Rfl: 3   nitroGLYCERIN (NITROSTAT) 0.4 MG SL tablet, DISSOLVE 1 TABLET UNDER THE TONGUE EVERY 5 MINUTES AS NEEDED FOR CHEST PAIN UP TO 3 DOSES, Disp: 25 tablet, Rfl: 1   Prasterone, DHEA, (DHEA 50 PO), Take 1 capsule by mouth daily. Take for 4 weeks, then off for 2 weeks, Disp: , Rfl:    predniSONE (DELTASONE) 20 MG tablet, Take 2 tablets (40 mg total) by mouth daily., Disp: 10 tablet, Rfl: 0   Turmeric 500 MG CAPS,  Take 500 mg by mouth daily., Disp: , Rfl:    Vitamin D, Ergocalciferol, (DRISDOL) 1.25 MG (50000 UNIT) CAPS capsule, TAKE 1 CAPSULE EVERY 7 DAYS, Disp: 12 capsule, Rfl: 3   Cinnamon 500 MG capsule, Take 500 mg by mouth 2 (two) times daily. (Patient not taking: Reported on 05/08/2021), Disp: , Rfl:    gabapentin (NEURONTIN) 100 MG capsule, Take 2 capsules (200 mg total) by mouth at bedtime. (Patient not taking: Reported on 05/08/2021), Disp: 180 capsule, Rfl: 3   Review of Systems:   ROS Negative unless otherwise specified per HPI.  Vitals:   Vitals:   05/08/21 1003  BP: 102/64  Pulse: 80  Temp: 98.3 F (36.8 C)  TempSrc: Temporal  SpO2: 95%  Weight: 201 lb (91.2 kg)  Height: '5\' 7"'$  (1.702 m)     Body mass index is 31.48 kg/m.  Physical Exam:   Physical Exam Vitals and nursing note reviewed.  Constitutional:      General: He is not in acute distress.    Appearance: He is well-developed. He is not ill-appearing or toxic-appearing.  HENT:     Head: Normocephalic and atraumatic.     Right Ear: Ear canal and external ear normal. A middle ear effusion is present. Tympanic membrane is erythematous. Tympanic membrane is not retracted or bulging.     Left Ear: Tympanic membrane, ear canal and external ear normal. Tympanic membrane is not erythematous, retracted or bulging.     Nose: Nose normal.     Right Sinus: No maxillary sinus tenderness or frontal sinus tenderness.     Left Sinus: No maxillary sinus tenderness or frontal sinus tenderness.     Mouth/Throat:     Pharynx: Uvula midline. No posterior oropharyngeal erythema.  Eyes:     General: Lids are normal.     Conjunctiva/sclera: Conjunctivae normal.  Neck:     Trachea: Trachea normal.  Cardiovascular:     Rate and Rhythm: Normal rate and regular rhythm.     Heart sounds: Normal heart sounds, S1 normal and S2 normal.  Pulmonary:     Effort: Pulmonary effort is normal.     Breath sounds: Normal breath sounds. No decreased  breath sounds, wheezing, rhonchi or rales.  Lymphadenopathy:     Cervical: No cervical adenopathy.  Skin:    General: Skin is warm and dry.  Neurological:     Mental Status: He is alert.  Psychiatric:        Speech:  Speech normal.        Behavior: Behavior normal. Behavior is cooperative.     Assessment and Plan:   Claire was seen today for otalgia.  Diagnoses and all orders for this visit:  Acute otitis media, unspecified otitis media type  Other orders -     amoxicillin-clavulanate (AUGMENTIN) 875-125 MG tablet; Take 1 tablet by mouth 2 (two) times daily. -     predniSONE (DELTASONE) 20 MG tablet; Take 2 tablets (40 mg total) by mouth daily.  Unresolved. Stop amoxicillin Start augmentin He would like to avoid nasal sprays due to hx of nosebleeds in the past while on them + blood thinner Start antihistamine of choice Pocket rx of prednisone also given if sx do not improve in a few days  If no improvement, will refer to ENT  CMA or LPN served as scribe during this visit. History, Physical, and Plan performed by medical provider. The above documentation has been reviewed and is accurate and complete.   Inda Coke, PA-C

## 2021-05-08 NOTE — Patient Instructions (Addendum)
It was great to see you!  Stop amoxicillin Start augmentin antibiotic Re-start antihistimane of choice  If no improvement of symptoms, use the prednisone  If still no improvement, let me know  From the allergist: May use over the counter antihistamines such as Zyrtec (cetirizine), Claritin (loratadine), Allegra (fexofenadine), or Xyzal (levocetirizine) daily as needed. May take twice a day if needed as long as it does not cause drowsiness. Nasal saline spray (i.e., Simply Saline) or nasal saline lavage (i.e., NeilMed) is recommended as needed and prior to medicated nasal sprays.  Take care,  Inda Coke PA-C

## 2021-05-14 ENCOUNTER — Other Ambulatory Visit: Payer: Self-pay | Admitting: Physician Assistant

## 2021-05-31 ENCOUNTER — Ambulatory Visit: Payer: Medicare Other | Admitting: Family Medicine

## 2021-06-05 ENCOUNTER — Ambulatory Visit: Payer: Medicare Other | Admitting: Dermatology

## 2021-06-14 NOTE — Progress Notes (Signed)
[facility] [place] Phone: [phone] Subjective:   [name], am serving as a scribe for Dr. Hulan Saas.  This visit occurred during the SARS-CoV-2 public health emergency.  Safety protocols were in place, including screening questions prior to the visit, additional usage of staff PPE, and extensive cleaning of exam room while observing appropriate contact time as indicated for disinfecting solutions.   I'm seeing this patient by the request  of:  [name], MD  CC: Neck and back pain follow-up  [id number]  Michael [name] is a 65 y.o. male coming in with complaint of back and neck pain. OMT 04/12/2021.  Patient states that he is doing much better. Nerve pain in his arm has improved. Some numbness in pinky L hand.  Patient did have a nerve conduction study done.  Nerve conduction showed that patient did have a right side mild ulnar neuropathy noted.  Patient states that they did discontinue the gabapentin though secondary to giving him difficulty.  Otherwise though feels like with him not doing as much of the repetitive activity seems to be doing better.  Medications patient has been prescribed: Gabapentin  Taking: Normal         Reviewed prior external information including notes and imaging from previsou exam, outside providers and external EMR if available.   As well as notes that were available from care everywhere and other healthcare systems.  Past medical history, social, surgical and family history all reviewed in electronic medical record.  No pertanent information unless stated regarding to the chief complaint.   Past Medical History:  Diagnosis Date   CAD S/P PCI to Cx & RCA    a. s/p PCI of LCx (2006) and ostium of RCA (2007) as well as DES to RCA in [place] (05/2014)   HTN (hypertension)    Hyperlipidemia     No Known Allergies   Review of Systems:  No headache, visual  changes, nausea, vomiting, diarrhea, constipation, dizziness, abdominal pain, skin rash, fevers, chills, night sweats, weight loss, swollen lymph nodes, body aches, joint swelling, chest pain, shortness of breath, mood changes. POSITIVE muscle aches  Objective  Blood pressure 124/80, pulse 92, height 5\' 7"  (1.702 m), weight 200 lb (90.7 kg).   General: No apparent distress alert and oriented x3 mood and affect normal, dressed appropriately.  HEENT: Pupils equal, extraocular movements intact  Respiratory: Patient's speak in full sentences and does not appear short of breath  Cardiovascular: No lower extremity edema, non tender, no erythema   low back exam does have some mild loss of lordosis.  Patient is still working on the core strengthening.  Neurovascularly intact distally.  Tightness noted of the straight leg test. Neck exam does have some mild loss of lordosis.  Some tightness with sidebending bilaterally.  Mild crepitus noted.  5 out of 5 strength otherwise.  Tender also noted of the right elbow area.  Osteopathic findings  C2 flexed rotated and side bent right C6 flexed rotated and side bent left T3 extended rotated and side bent right inhaled rib T9 extended rotated and side bent left L2 flexed rotated and side bent right Sacrum right on right       Assessment and Plan:  Cervical disc disorder with radiculopathy of cervical region Known degenerative disc disease but nerve conduction study was unremarkable.  Discussed with patient icing regimen and home exercises.  Increase activity slowly.  Patient is doing well  longer taking the gabapentin but will keep it on his medication profile in case he would want to go back but did seem to give him more of the pain refilling.  Discussed which activities to doing which wants to avoid.  Increase activity slowly.  Follow-up again in 6 to 8 weeks.   Nonallopathic problems  Decision today to treat with OMT was based on Physical  Exam  After verbal consent patient was treated with HVLA, ME, FPR techniques in cervical, rib, thoracic, lumbar, and sacral  areas  Patient tolerated the procedure well with improvement in symptoms  Patient given exercises, stretches and lifestyle modifications  See medications in patient instructions if given  Patient will follow up in 4-8 weeks     The above documentation has been reviewed and is accurate and complete [name], DO        Note: This dictation was prepared with Dragon dictation along with smaller phrase technology. Any transcriptional errors that result from this process are unintentional.

## 2021-06-17 ENCOUNTER — Ambulatory Visit (INDEPENDENT_AMBULATORY_CARE_PROVIDER_SITE_OTHER): Payer: Medicare Other | Admitting: Family Medicine

## 2021-06-17 ENCOUNTER — Other Ambulatory Visit: Payer: Self-pay

## 2021-06-17 ENCOUNTER — Encounter: Payer: Self-pay | Admitting: Family Medicine

## 2021-06-17 VITALS — BP 124/80 | HR 92 | Ht 67.0 in | Wt 200.0 lb

## 2021-06-17 DIAGNOSIS — M5010 Cervical disc disorder with radiculopathy, unspecified cervical region: Secondary | ICD-10-CM | POA: Diagnosis not present

## 2021-06-17 DIAGNOSIS — I255 Ischemic cardiomyopathy: Secondary | ICD-10-CM

## 2021-06-17 DIAGNOSIS — M9908 Segmental and somatic dysfunction of rib cage: Secondary | ICD-10-CM | POA: Diagnosis not present

## 2021-06-17 DIAGNOSIS — M9901 Segmental and somatic dysfunction of cervical region: Secondary | ICD-10-CM | POA: Diagnosis not present

## 2021-06-17 DIAGNOSIS — M9903 Segmental and somatic dysfunction of lumbar region: Secondary | ICD-10-CM | POA: Diagnosis not present

## 2021-06-17 DIAGNOSIS — M9902 Segmental and somatic dysfunction of thoracic region: Secondary | ICD-10-CM

## 2021-06-17 DIAGNOSIS — M549 Dorsalgia, unspecified: Secondary | ICD-10-CM

## 2021-06-17 DIAGNOSIS — G8929 Other chronic pain: Secondary | ICD-10-CM

## 2021-06-17 DIAGNOSIS — M9904 Segmental and somatic dysfunction of sacral region: Secondary | ICD-10-CM | POA: Diagnosis not present

## 2021-06-17 NOTE — Assessment & Plan Note (Signed)
Chronic with exacerbation.  Discussed the possibility for the Flexeril and using that intermittently instead of the gabapentin.  Patient will continue to work on home exercises and icing regimen.  Follow-up again in 6 to 8 weeks

## 2021-06-17 NOTE — Patient Instructions (Signed)
Try biking, elliptical or swimming Stretch for back Wear elbow compression with repetitive activity See me in 7-8 weeks

## 2021-06-17 NOTE — Assessment & Plan Note (Signed)
Known degenerative disc disease but nerve conduction study was unremarkable.  Discussed with patient icing regimen and home exercises.  Increase activity slowly.  Patient is doing well longer taking the gabapentin but will keep it on his medication profile in case he would want to go back but did seem to give him more of the pain refilling.  Discussed which activities to doing which wants to avoid.  Increase activity slowly.  Follow-up again in 6 to 8 weeks.

## 2021-06-19 ENCOUNTER — Ambulatory Visit (INDEPENDENT_AMBULATORY_CARE_PROVIDER_SITE_OTHER): Payer: Medicare Other | Admitting: Dermatology

## 2021-06-19 ENCOUNTER — Encounter: Payer: Self-pay | Admitting: Dermatology

## 2021-06-19 ENCOUNTER — Other Ambulatory Visit: Payer: Self-pay

## 2021-06-19 DIAGNOSIS — L308 Other specified dermatitis: Secondary | ICD-10-CM | POA: Diagnosis not present

## 2021-06-19 DIAGNOSIS — I255 Ischemic cardiomyopathy: Secondary | ICD-10-CM | POA: Diagnosis not present

## 2021-06-19 NOTE — Patient Instructions (Addendum)
   CeraVe Itch Relief Moisturizing Lotion with Pramoxine Hydrochloride for Dry Skin

## 2021-07-06 ENCOUNTER — Encounter: Payer: Self-pay | Admitting: Dermatology

## 2021-07-06 NOTE — Progress Notes (Signed)
   Follow-Up Visit   Subjective  Michael [name] is a 66 y.o. male who presents for the following: Follow-up (Patient stated he had a groin rash months ago and was treated with prednisone twice. He still breaks out on his abdomen with patches that scale and itch. He also stated his wife was flared about the same time bed bugs were no ruled out he was traveling at the time ).  Itching groin Location:  Duration:  Quality:  Associated Signs/Symptoms: Modifying Factors:  Severity:  Timing: Context:   Objective  Well appearing patient in no apparent distress; mood and affect are within normal limits. Pubic Excoriations without Primary lesion.  No sign of these.  No marginated spots to suggest tinea (on feet clear).  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    A focused examination was performed including lower abdomen, genitalia, perianal, upper legs, finger webs, feet.. Relevant physical exam findings are noted in the Assessment and Plan.   Assessment & Plan    Other eczema Pubic   CeraVe Itch Relief Moisturizing Lotion with Pramoxine Hydrochloride for Dry Skin  Doxepin cream next step therapy zonalon      I, Lavonna Monarch, MD, have reviewed all documentation for this visit.  The documentation on 07/06/21 for the exam, diagnosis, procedures, and orders are all accurate and complete.

## 2021-07-31 NOTE — Progress Notes (Signed)
Phone [phone] In person visit   Subjective:   Michael [name] is a 65 y.o. year old very pleasant male patient who presents for/with See problem oriented charting Chief Complaint  Patient presents with   Hyperlipidemia   Hypertension    This visit occurred during the SARS-CoV-2 public health emergency.  Safety protocols were in place, including screening questions prior to the visit, additional usage of staff PPE, and extensive cleaning of exam room while observing appropriate contact time as indicated for disinfecting solutions.   Past Medical History-  Patient Active Problem List   Diagnosis Date Noted   Ischemic cardiomyopathy 12/05/2019    Priority: High   CAD S/P multiple PCIs 09/08/2014    Priority: High   History of adenomatous polyp of colon 06/07/2018    Priority: Medium    BPH (benign prostatic hyperplasia) 06/07/2018    Priority: Medium    Impaired glucose tolerance 11/03/2016    Priority: Medium    HTN (hypertension)     Priority: Medium    Dyslipidemia 09/08/2014    Priority: Medium    Left shoulder pain 01/24/2021    Priority: Low   AC (acromioclavicular) arthritis 01/24/2021    Priority: Low   Vitiligo 06/07/2018    Priority: Low   Gluteal pain 04/10/2018    Priority: Low   Plantar fasciitis 10/12/2017    Priority: Low   Back pain 04/13/2017    Priority: Low   Nonallopathic lesion of thoracic region 04/13/2017    Priority: Low   Nonallopathic lesion of sacral region 04/13/2017    Priority: Low   Nonallopathic lesion of lumbosacral region 04/13/2017    Priority: Low   Obesity     Priority: Low   Cervical disc disorder with radiculopathy of cervical region 03/15/2021   Abnormal chest x-ray 01/24/2021   Right carpal tunnel syndrome 09/05/2020   Perianal irritation 06/08/2020    Medications- reviewed and updated Current Outpatient Medications  Medication Sig Dispense Refill   aspirin 81 MG tablet Take 81 mg by mouth daily.      atorvastatin (LIPITOR) 80 MG tablet TAKE 1 TABLET DAILY 90 tablet 3   BRILINTA 90 MG TABS tablet TAKE 1 TABLET TWICE A DAY 180 tablet 3   CHERRY PO Take 50 mg by mouth daily. Cherry extract     Cinnamon 500 MG capsule Take 500 mg by mouth 2 (two) times daily.     cyclobenzaprine (FLEXERIL) 10 MG tablet Take 1 tablet (10 mg total) by mouth 2 (two) times daily as needed for muscle spasms. 20 tablet 0   hydrocortisone (ANUSOL-HC) 2.5 % rectal cream Place 1 application rectally 2 (two) times daily. 30 g 1   isosorbide mononitrate (IMDUR) 60 MG 24 hr tablet TAKE 1 TABLET DAILY 90 tablet 3   nitroGLYCERIN (NITROSTAT) 0.4 MG SL tablet DISSOLVE 1 TABLET UNDER THE TONGUE EVERY 5 MINUTES AS NEEDED FOR CHEST PAIN UP TO 3 DOSES 25 tablet 1   Turmeric 500 MG CAPS Take 500 mg by mouth daily.     Vitamin D, Ergocalciferol, (DRISDOL) 1.25 MG (50000 UNIT) CAPS capsule TAKE 1 CAPSULE EVERY 7 DAYS 12 capsule 3   No current facility-administered medications for this visit.     Objective:  BP 118/68   Pulse 68   Temp 98.1 F (36.7 C) (Temporal)   Ht 5\' 6"  (1.676 m)   Wt 199 lb 12.8 oz (90.6 kg)   SpO2 98%   BMI 32.25 kg/m  Gen: NAD, resting  comfortably CV: RRR no murmurs rubs or gallops Lungs: CTAB no crackles, wheeze, rhonchi Abdomen: soft/nontender/nondistended/normal bowel sounds. No rebound or guarding.  Ext: no edema Skin: warm, dry Neuro: grossly normal, moves all extremities     Assessment and Plan     #CAD with angina controlled on Imdur #hyperlipidemia-LDL goal under 70 S: Medication:Atorvastatin 80 mg daily, aspirin 81 mg daily, Brilinta 90 mg twice daily, Imdur 60 mg daily, nitroglycerin on hand  but has not had to use recently- months ago  Current symptoms: No chest pain or shortness of breath noted  Lab Results  Component Value Date   CHOL 117 08/03/2020   HDL 43 08/03/2020   LDLCALC 54 08/03/2020   LDLDIRECT 62.0 09/14/2019   TRIG 117 08/03/2020   CHOLHDL 2.7 08/03/2020    A/P: CAD with stable angina on Imdur.  Continue current medication.  Now due for full lipid panel-LDL goal under 70-for now continue atorvastatin 80 mg   #hypertension S: medication: Imdur 60 mg daily Home readings #s: does not check BP Readings from Last 3 Encounters:  08/07/21 118/68  06/17/21 124/80  05/08/21 102/64  A/P:  Controlled. Continue current medications.    # Hyperglycemia/insulin resistance/prediabetes-peak A1c 6.0 S: Medication: None Exercise and diet- [employer] membership- resistance plus swimming twice a week- taking some time for body to recover- wants to work towards 3 - weight pretty stable but is going to work on carbs and portion sizes Lab Results  Component Value Date   HGBA1C 6.3 02/05/2021   HGBA1C 6.0 (H) 08/03/2020   HGBA1C 6.0 09/14/2019   A/P: A1c trending up last visit-update A1c with labs-discussed statins due to increased diabetes risk but with CAD certainly needs to continue  # B12 deficiency S: Current treatment/medication (oral vs. IM): Patient with low under 200 November 2021-treated with 4 weeks of B12 injections. At last visit taking no medication as planned.  -restarted oral b12 (preferred not to do injections)  Lab Results  Component Value Date   VITAMINB12 79 (L) 02/05/2021  A/P: b12 hopefully improved- update with labs and continue oral b12 1000 mcg a day   # groin itching S:saw Dr. Denna Haggard 06/19/21 thought  eczema related. Cerave seems to be helping.  -jock itch cream didn't help. Steroid cream didn't help A/P: was told eczema- if fails to ocntinue to improve would recommend seeing Dr. Denna Haggard again   #gabapentin for elbow issues- more irritability- rx thrugh Dr. Nonie Hoyer- he stopped this- also felt foggy next morning  #screen prostate cancer - has nocturia 1-2x a night Lab Results  Component Value Date   PSA 1.37 08/03/2020   PSA 1.25 03/15/2019   PSA 1.53 12/08/2017   #Vitamin D high dose per DR. Tamala Julian- tried to check vitamin D to  make sure not too high with ongoing high dose vitamin D but ABN would not print and I legally cannot order without his signature so he will discuss with Dr. Tamala Julian  Recommended follow up: Return in about 6 months (around 02/04/2022) for follow up- or sooner if needed. Future Appointments  Date Time Provider [place]  09/05/2021  9:00 AM [name], DO [facility] None  11/27/2021  8:30 AM Lavonna Monarch, MD [facility]    Lab/Order associations:   ICD-10-CM   1. Hyperglycemia  R73.9 HgB A1c    2. Primary hypertension  I10     3. Coronary artery disease with stable angina pectoris, unspecified vessel or lesion type, unspecified whether native or transplanted  heart ([place])  I25.118     4. Need for immunization against influenza  Z23 Flu Vaccine QUAD High Dose(Fluad)    5. Impaired glucose tolerance  R73.02     6. B12 deficiency  E53.8 Vitamin B12    7. Nocturia  R35.1 PSA    8. Dyslipidemia  E78.5 CBC with Differential/Platelet    Comprehensive metabolic panel    Lipid panel     No orders of the defined types were placed in this encounter.  I,Jada Bradford,acting as a scribe for Garret Reddish, MD.,have documented all relevant documentation on the behalf of Garret Reddish, MD,as directed by  Garret Reddish, MD while in the presence of Garret Reddish, MD.  I, Garret Reddish, MD, have reviewed all documentation for this visit. The documentation on 08/07/21 for the exam, diagnosis, procedures, and orders are all accurate and complete.  Return precautions advised.  Garret Reddish, MD

## 2021-08-06 ENCOUNTER — Ambulatory Visit: Payer: Medicare Other | Admitting: Family Medicine

## 2021-08-07 ENCOUNTER — Ambulatory Visit (INDEPENDENT_AMBULATORY_CARE_PROVIDER_SITE_OTHER): Payer: Medicare Other | Admitting: Family Medicine

## 2021-08-07 ENCOUNTER — Encounter: Payer: Self-pay | Admitting: Family Medicine

## 2021-08-07 ENCOUNTER — Other Ambulatory Visit: Payer: Self-pay

## 2021-08-07 VITALS — BP 118/68 | HR 68 | Temp 98.1°F | Ht 66.0 in | Wt 199.8 lb

## 2021-08-07 DIAGNOSIS — I10 Essential (primary) hypertension: Secondary | ICD-10-CM | POA: Diagnosis not present

## 2021-08-07 DIAGNOSIS — I25118 Atherosclerotic heart disease of native coronary artery with other forms of angina pectoris: Secondary | ICD-10-CM | POA: Diagnosis not present

## 2021-08-07 DIAGNOSIS — R351 Nocturia: Secondary | ICD-10-CM

## 2021-08-07 DIAGNOSIS — E785 Hyperlipidemia, unspecified: Secondary | ICD-10-CM | POA: Diagnosis not present

## 2021-08-07 DIAGNOSIS — I255 Ischemic cardiomyopathy: Secondary | ICD-10-CM | POA: Diagnosis not present

## 2021-08-07 DIAGNOSIS — Z23 Encounter for immunization: Secondary | ICD-10-CM | POA: Diagnosis not present

## 2021-08-07 DIAGNOSIS — R7302 Impaired glucose tolerance (oral): Secondary | ICD-10-CM

## 2021-08-07 DIAGNOSIS — E538 Deficiency of other specified B group vitamins: Secondary | ICD-10-CM

## 2021-08-07 DIAGNOSIS — Z79899 Other long term (current) drug therapy: Secondary | ICD-10-CM

## 2021-08-07 DIAGNOSIS — R739 Hyperglycemia, unspecified: Secondary | ICD-10-CM

## 2021-08-07 LAB — LIPID PANEL
Cholesterol: 117 mg/dL (ref 0–200)
HDL: 49.8 mg/dL (ref >39.00)
LDL Cholesterol: 52 mg/dL (ref 0–99)
NonHDL: 67.23
Total CHOL/HDL Ratio: 2
Triglycerides: 77 mg/dL (ref 0.0–149.0)
VLDL: 15.4 mg/dL (ref 0.0–40.0)

## 2021-08-07 LAB — COMPREHENSIVE METABOLIC PANEL
ALT: 82 U/L — ABNORMAL HIGH (ref 0–53)
AST: 50 U/L — ABNORMAL HIGH (ref 0–37)
Albumin: 4.1 g/dL (ref 3.5–5.2)
Alkaline Phosphatase: 87 U/L (ref 39–117)
BUN: 26 mg/dL — ABNORMAL HIGH (ref 6–23)
CO2: 28 mEq/L (ref 19–32)
Calcium: 9.2 mg/dL (ref 8.4–10.5)
Chloride: 105 mEq/L (ref 96–112)
Creatinine, Ser: 1.13 mg/dL (ref 0.40–1.50)
GFR: 67.78 mL/min (ref >60.00)
Glucose, Bld: 105 mg/dL — ABNORMAL HIGH (ref 70–99)
Potassium: 4.6 mEq/L (ref 3.5–5.1)
Sodium: 141 mEq/L (ref 135–145)
Total Bilirubin: 1.4 mg/dL — ABNORMAL HIGH (ref 0.2–1.2)
Total Protein: 6.5 g/dL (ref 6.0–8.3)

## 2021-08-07 LAB — CBC WITH DIFFERENTIAL/PLATELET
Basophils Absolute: 0 10*3/uL (ref 0.0–0.1)
Basophils Relative: 0.5 % (ref 0.0–3.0)
Eosinophils Absolute: 0.1 10*3/uL (ref 0.0–0.7)
Eosinophils Relative: 1 % (ref 0.0–5.0)
HCT: 43 % (ref 39.0–52.0)
Hemoglobin: 14.8 g/dL (ref 13.0–17.0)
Lymphocytes Relative: 14.8 % (ref 12.0–46.0)
Lymphs Abs: 1 10*3/uL (ref 0.7–4.0)
MCHC: 34.4 g/dL (ref 30.0–36.0)
MCV: 93.6 fl (ref 78.0–100.0)
Monocytes Absolute: 0.6 10*3/uL (ref 0.1–1.0)
Monocytes Relative: 9.2 % (ref 3.0–12.0)
Neutro Abs: 5.1 10*3/uL (ref 1.4–7.7)
Neutrophils Relative %: 74.5 % (ref 43.0–77.0)
Platelets: 211 10*3/uL (ref 150.0–400.0)
RBC: 4.6 Mil/uL (ref 4.22–5.81)
RDW: 13.6 % (ref 11.5–15.5)
WBC: 6.8 10*3/uL (ref 4.0–10.5)

## 2021-08-07 LAB — HEMOGLOBIN A1C: Hgb A1c MFr Bld: 6.2 % (ref 4.6–6.5)

## 2021-08-07 LAB — PSA: PSA: 1.43 ng/mL (ref 0.10–4.00)

## 2021-08-07 LAB — VITAMIN B12: Vitamin B-12: 480 pg/mL (ref 211–911)

## 2021-08-07 NOTE — Patient Instructions (Addendum)
Health Maintenance Due  Topic Date Due   COVID-19 Vaccine (1)  Wants to hold off on covid vaccine Never done   Zoster Vaccines- Shingrix (1 of 2)  Please check with your pharmacy to see if they have the shingrix vaccine. If they do- please get this immunization and update [place] by phone call or mychart with dates you receive the vaccine  Never done   Pneumonia Vaccine 59+ Years old (2 - PCV)  - prefers to hold off for now- consider next year as had first pneumonia shot just last year 08/03/2021   Please stop by lab before you go If you have mychart- we will send your results within 3 business days of [place] receiving them.  If you do not have mychart- we will call you about results within 5 business days of [place] receiving them.  *please also note that you will see labs on mychart as soon as they post. I will later go in and write notes on them- will say "notes from Dr. Yong Channel"  Recommended follow up: Return in about 6 months (around 02/04/2022) for follow up- or sooner if needed.

## 2021-08-09 ENCOUNTER — Other Ambulatory Visit: Payer: Self-pay

## 2021-08-09 DIAGNOSIS — R17 Unspecified jaundice: Secondary | ICD-10-CM

## 2021-08-09 DIAGNOSIS — R7989 Other specified abnormal findings of blood chemistry: Secondary | ICD-10-CM

## 2021-08-26 ENCOUNTER — Ambulatory Visit
Admission: RE | Admit: 2021-08-26 | Discharge: 2021-08-26 | Disposition: A | Discharge status: Home / Self Care | Payer: Medicare Other | Source: Ambulatory Visit | Attending: Family Medicine | Admitting: Family Medicine

## 2021-08-26 DIAGNOSIS — R17 Unspecified jaundice: Secondary | ICD-10-CM

## 2021-08-26 DIAGNOSIS — R7989 Other specified abnormal findings of blood chemistry: Secondary | ICD-10-CM

## 2021-08-27 ENCOUNTER — Ambulatory Visit: Payer: Medicare Other

## 2021-08-28 ENCOUNTER — Other Ambulatory Visit: Payer: Self-pay | Admitting: Family Medicine

## 2021-09-04 NOTE — Progress Notes (Signed)
[facility] [place] Phone: [phone] Subjective:   Michael [name], am serving as a scribe for Dr. Hulan [name].  This visit occurred during the SARS-CoV-2 public health emergency.  Safety protocols were in place, including screening questions prior to the visit, additional usage of staff PPE, and extensive cleaning of exam room while observing appropriate contact time as indicated for disinfecting solutions.   I'm seeing this patient by the request  of:  Michael Olp, MD  CC: Neck and back pain follow-up  [id number]  Michael [name] is a 65 y.o. male coming in with complaint of back and neck pain. OMT 06/17/2021. Patient states that he started going to the gym and working out has caused some aches and pains. R shoulder was injected previously and now having dull ache in R scapula. Complains of intermittent lower back pain. Also notes pain in lateral upper R leg when he sits on couch for a long time with leg in FABER position.   Medications patient has been prescribed: None          Reviewed prior external information including notes and imaging from previsou exam, outside providers and external EMR if available.   As well as notes that were available from care everywhere and other healthcare systems.  Past medical history, social, surgical and family history all reviewed in electronic medical record.  [name] pertanent information unless stated regarding to the chief complaint.   Past Medical History:  Diagnosis Date   CAD S/P PCI to Cx & RCA    a. s/p PCI of LCx (2006) and ostium of RCA (2007) as well as DES to RCA in [place] (05/2014)   HTN (hypertension)    Hyperlipidemia     [name] Known Allergies   Review of Systems:  [name] headache, visual changes, nausea, vomiting, diarrhea, constipation, dizziness, abdominal pain, skin rash, fevers, chills, night sweats, weight loss, swollen lymph nodes, body aches,  joint swelling, chest pain, shortness of breath, mood changes. POSITIVE muscle aches  Objective  Blood pressure 128/78, pulse 78, height 5\' 6"  (1.676 m), weight 200 lb (90.7 kg), SpO2 98 %.   General: [name] apparent distress alert and oriented x3 mood and affect normal, dressed appropriately.  HEENT: Pupils equal, extraocular movements intact  Respiratory: Patient's speak in full sentences and does not appear short of breath  Cardiovascular: [name] lower extremity edema, non tender, [name] erythema  Patient does have some loss of lordosis.  Tightness in the lower back again noted today.  Patient does have tightness of the hamstrings bilaterally.  Difficult to do [place]y Sox secondary to also tight.  Osteopathic findings   C3 flexed rotated and side bent left T3 extended rotated and side bent right inhaled rib T7 extended rotated and side bent left L2 flexed rotated and side bent right Sacrum right on right       Assessment and Plan:  Gluteal pain Increased tightness of the lower back.  Discussed posture and ergonomics, discussed which activities to do which wants to avoid.  Increase activity slowly.  Follow-up with me again in 6 to 8 weeks did discuss with this being a chronic problem with exacerbation of the cyclobenzaprine if needed.   Nonallopathic problems  Decision today to treat with OMT was based on Physical Exam  After verbal consent patient was treated with  ME, FPR techniques in cervical, rib, thoracic, lumbar, and sacral  areas  Patient tolerated the procedure well with  improvement in symptoms  Patient given exercises, stretches and lifestyle modifications  See medications in patient instructions if given  Patient will follow up in 4-8 weeks     The above documentation has been reviewed and is accurate and complete Michael Pulley, DO        Note: This dictation was prepared with Dragon dictation along with smaller phrase technology. Any transcriptional errors that result  from this process are unintentional.

## 2021-09-05 ENCOUNTER — Other Ambulatory Visit: Payer: Self-pay

## 2021-09-05 ENCOUNTER — Ambulatory Visit (INDEPENDENT_AMBULATORY_CARE_PROVIDER_SITE_OTHER): Payer: Medicare Other | Admitting: Family Medicine

## 2021-09-05 ENCOUNTER — Encounter: Payer: Self-pay | Admitting: Family Medicine

## 2021-09-05 VITALS — BP 128/78 | HR 78 | Ht 66.0 in | Wt 200.0 lb

## 2021-09-05 DIAGNOSIS — M9903 Segmental and somatic dysfunction of lumbar region: Secondary | ICD-10-CM

## 2021-09-05 DIAGNOSIS — M9902 Segmental and somatic dysfunction of thoracic region: Secondary | ICD-10-CM | POA: Diagnosis not present

## 2021-09-05 DIAGNOSIS — M9904 Segmental and somatic dysfunction of sacral region: Secondary | ICD-10-CM

## 2021-09-05 DIAGNOSIS — M7918 Myalgia, other site: Secondary | ICD-10-CM

## 2021-09-05 DIAGNOSIS — I255 Ischemic cardiomyopathy: Secondary | ICD-10-CM

## 2021-09-05 DIAGNOSIS — M9908 Segmental and somatic dysfunction of rib cage: Secondary | ICD-10-CM | POA: Diagnosis not present

## 2021-09-05 DIAGNOSIS — M9901 Segmental and somatic dysfunction of cervical region: Secondary | ICD-10-CM | POA: Diagnosis not present

## 2021-09-05 NOTE — Assessment & Plan Note (Signed)
Increased tightness of the lower back.  Discussed posture and ergonomics, discussed which activities to do which wants to avoid.  Increase activity slowly.  Follow-up with me again in 6 to 8 weeks did discuss with this being a chronic problem with exacerbation of the cyclobenzaprine if needed.

## 2021-09-05 NOTE — Patient Instructions (Signed)
Great to see you Recover is as important as exercises See me in 6-8 weeks

## 2021-09-24 ENCOUNTER — Other Ambulatory Visit: Payer: Self-pay

## 2021-09-24 ENCOUNTER — Ambulatory Visit (INDEPENDENT_AMBULATORY_CARE_PROVIDER_SITE_OTHER): Payer: Medicare Other

## 2021-09-24 DIAGNOSIS — Z0000 Encounter for general adult medical examination without abnormal findings: Secondary | ICD-10-CM | POA: Diagnosis not present

## 2021-09-24 NOTE — Progress Notes (Signed)
Virtual Visit via Telephone Note  I connected with  Michael [name] on 09/24/21 at  8:45 AM EST by telephone and verified that I am speaking with the correct person using two identifiers.  Medicare Annual Wellness visit completed telephonically due to Covid-19 pandemic.   Persons participating in this call: This Health Coach and this patient.   Location: Patient: home Provider: office   I discussed the limitations, risks, security and privacy concerns of performing an evaluation and management service by telephone and the availability of in person appointments. The patient expressed understanding and agreed to proceed.  Unable to perform video visit due to video visit attempted and failed and/or patient does not have video capability.   Some vital signs may be absent or patient reported.   Willette Brace, LPN   Subjective:   Michael [name] is a 66 y.o. male who presents for an Initial Medicare Annual Wellness Visit.  Review of Systems     Cardiac Risk Factors include: advanced age (>32men, >21 women);dyslipidemia;male gender;hypertension;obesity (BMI >30kg/m2)     Objective:    There were no vitals filed for this visit. There is no height or weight on file to calculate BMI.  Advanced Directives 09/24/2021 01/19/2021 08/05/2019 05/23/2019 12/17/2018 01/20/2018 04/23/2017  Does Patient Have a Medical Advance Directive? Yes No No No Yes Yes Yes  Type of Advance Directive [place];Living will [place];Living will [place];Living will  Does patient want to make changes to medical advance directive? - - - - No - Patient declined - -  Copy of [place] in Chart? Yes - validated most recent copy scanned in chart (See row information) - - - Yes - validated most recent copy scanned in chart (See row information) Yes -  Would patient like information on creating a medical  advance directive? - No - Patient declined - No - Patient declined - - -    Current Medications (verified) Outpatient Encounter Medications as of 09/24/2021  Medication Sig   aspirin 81 MG tablet Take 81 mg by mouth daily.   atorvastatin (LIPITOR) 80 MG tablet TAKE 1 TABLET DAILY   BRILINTA 90 MG TABS tablet TAKE 1 TABLET TWICE A DAY   CHERRY PO Take 50 mg by mouth daily. Cherry extract   Cinnamon 500 MG capsule Take 500 mg by mouth 2 (two) times daily.   cyclobenzaprine (FLEXERIL) 10 MG tablet Take 1 tablet (10 mg total) by mouth 2 (two) times daily as needed for muscle spasms.   EPINEPHrine 0.3 mg/0.3 mL IJ SOAJ injection See admin instructions.   hydrocortisone (ANUSOL-HC) 2.5 % rectal cream Place 1 application rectally 2 (two) times daily.   isosorbide mononitrate (IMDUR) 60 MG 24 hr tablet TAKE 1 TABLET DAILY   nitroGLYCERIN (NITROSTAT) 0.4 MG SL tablet DISSOLVE 1 TABLET UNDER THE TONGUE EVERY 5 MINUTES AS NEEDED FOR CHEST PAIN UP TO 3 DOSES   Turmeric 500 MG CAPS Take 500 mg by mouth daily.   Vitamin D, Ergocalciferol, (DRISDOL) 1.25 MG (50000 UNIT) CAPS capsule TAKE 1 CAPSULE EVERY 7 DAYS   cetirizine (ZYRTEC) 10 MG tablet Take 10 mg by mouth daily.   No facility-administered encounter medications on file as of 09/24/2021.    Allergies (verified) Patient has no known allergies.   History: Past Medical History:  Diagnosis Date   CAD S/P PCI to Cx & RCA    a. s/p PCI of  LCx (2006) and ostium of RCA (2007) as well as DES to RCA in [place] (05/2014)   HTN (hypertension)    Hyperlipidemia    Past Surgical History:  Procedure Laterality Date   CARDIAC CATHETERIZATION N/A 05/06/2016   Procedure: Left Heart Cath and Coronary Angiography;  Surgeon: Nelva Bush, MD;  Location: [facility];  Service: Cardiovascular;  Laterality: N/A;   CARDIAC CATHETERIZATION N/A 05/06/2016   Procedure: Coronary Stent Intervention;  Surgeon: Nelva Bush, MD;  Location: [facility];  Service: Cardiovascular;  Laterality: N/A;   CARDIAC CATHETERIZATION N/A 05/13/2016   Procedure: Coronary/Graft Angiography;  Surgeon: Peter M Martinique, MD;  Location: [facility];  Service: Cardiovascular;  Laterality: N/A;   CORONARY ANGIOPLASTY WITH STENT PLACEMENT  2006   in [place], occluded CFX, s/p Taxus stent   CORONARY ANGIOPLASTY WITH STENT PLACEMENT  2007   in [place], ostial RCA stent   CORONARY ANGIOPLASTY WITH STENT PLACEMENT  2015   in [place], RCA stent   TONSILLECTOMY AND ADENOIDECTOMY      Family History  Problem Relation Age of Onset   Hypertension Father    Dementia Father        Vascular. patient states dementia/possible alzheimers as well   Hyperlipidemia Sister    Cancer Brother        type unknown   Cancer Brother        type unknown   Heart disease Maternal Grandfather 44       Died probably of heart diseasse   Heart disease Maternal Uncle 46       Died of "heart exploding"   Cerebral palsy Daughter    Stroke Son        severe problems with blood clotting   Colon cancer Neg Hx    Esophageal cancer Neg Hx    Pancreatic cancer Neg Hx    Stomach cancer Neg Hx    Liver disease Neg Hx    Social History   Socioeconomic History   Marital status: Married    Spouse name: Not on file   Number of children: 4   Years of education: Not on file   Highest education level: Not on file  Occupational History   Occupation: retired  Tobacco Use   Smoking status: Never   Smokeless tobacco: Never  Vaping Use   Vaping Use: Never used  Substance and Sexual Activity   Alcohol use: No    Alcohol/week: 0.0 standard drinks   Drug use: No   Sexual activity: Yes  Other Topics Concern   Not on file  Social History Narrative   Lives with wife.  4 kids total (2 step, 2 biological). 1 that has CP. Daughter [name]- comes to [facility]).        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      Hobbies: gardening, lawn care, care  for grandkids   Social Determinants of Health   Financial Resource Strain: Low Risk    Difficulty of Paying Living Expenses: Not hard at all  Food Insecurity: No Food Insecurity   Worried About Charity fundraiser in the Last Year: Never true   Arboriculturist in the Last Year: Never true  Transportation Needs: No Transportation Needs   Lack of Transportation (Medical): No   Lack of Transportation (Non-Medical): No  Physical Activity: Inactive   Days of Exercise per Week: 0 days   Minutes of Exercise per Session:  0 min  Stress: No Stress Concern Present   Feeling of Stress : Not at all  Social Connections: Unknown   Frequency of Communication with Friends and Family: More than three times a week   Frequency of Social Gatherings with Friends and Family: More than three times a week   Attends Religious Services: Not on Electrical engineer or Organizations: Not on file   Attends Archivist Meetings: Not on file   Marital Status: Married    Tobacco Counseling Counseling given: Not Answered   Clinical Intake:  Pre-visit preparation completed: Yes  Pain : No/denies pain     BMI - recorded: 32.3 Nutritional Status: BMI > 30  Obese Nutritional Risks: None Diabetes: No  How often do you need to have someone help you when you read instructions, pamphlets, or other written materials from your doctor or pharmacy?: 1 - Never  Diabetic?no  Interpreter Needed?: No  Information entered by :: Charlott Rakes, LPN   Activities of Daily Living In your present state of health, do you have any difficulty performing the following activities: 09/24/2021  Hearing? N  Vision? N  Difficulty concentrating or making decisions? N  Walking or climbing stairs? N  Dressing or bathing? N  Doing errands, shopping? N  Preparing Food and eating ? N  Using the Toilet? N  In the past six months, have you accidently leaked urine? N  Do you have problems with loss of bowel  control? N  Managing your Medications? N  Managing your Finances? N  Housekeeping or managing your Housekeeping? N  Some recent data might be hidden    Patient Care Team: [name], MD as PCP - General (Family Medicine) Minus Breeding, MD as PCP - Cardiology (Cardiology) Lyndee Hensen, PT as Physical Therapist (Physical Therapy) Lavonna Monarch, MD as Consulting Physician (Dermatology)  Indicate any recent Medical Services you may have received from other than [facility] providers in the past year (date may be approximate).     Assessment:   This is a routine wellness examination for Michael [name].  Hearing/Vision screen Hearing Screening - Comments:: Pt denies any hearing issues Vision Screening - Comments:: Pt follows up with Dr Laban Emperor for annaul eye exams   Dietary issues and exercise activities discussed: Current Exercise Habits: The patient does not participate in regular exercise at present   Goals Addressed             This Visit's Progress    Patient Stated       None at this time       Depression Screen PHQ 2/9 Scores 09/24/2021 02/05/2021 09/14/2019 03/15/2019 09/29/2018 12/08/2017 08/09/2014  PHQ - 2 Score 0 0 0 0 0 0 0  PHQ- 9 Score - 0 - - - - -    Fall Risk Fall Risk  09/24/2021 02/05/2021 08/03/2020 12/08/2017  Falls in the past year? 0 0 0 No  Number falls in past yr: 0 0 0 -  Injury with Fall? 0 0 0 -  Risk for fall due to : Impaired vision - - -  Follow up Falls prevention discussed - - -    FALL RISK PREVENTION PERTAINING TO THE HOME:  Any stairs in or around the home? Yes  If so, are there any without handrails? No  Home free of loose throw rugs in walkways, pet beds, electrical cords, etc? Yes  Adequate lighting in your home to reduce risk of falls? Yes   ASSISTIVE  DEVICES UTILIZED TO PREVENT FALLS:  Life alert? No  Use of a cane, walker or w/c? No  Grab bars in the bathroom? Yes  Shower chair or bench in shower? No  Elevated toilet seat or a  handicapped toilet? No   TIMED UP AND GO:  Was the test performed? No .  Cognitive Function:     6CIT Screen 09/24/2021  What Year? 0 points  What month? 0 points  What time? 0 points  Count back from 20 0 points  Months in reverse 0 points  Repeat phrase 2 points  Total Score 2    Immunizations Immunization History  Administered Date(s) Administered   Fluad Quad(high Dose 65+) 08/03/2020, 08/07/2021   Influenza,inj,Quad PF,6+ Mos 06/07/2018, 09/14/2019   Influenza-Unspecified 06/26/2014   Pneumococcal Polysaccharide-23 08/03/2020   Td 12/08/2017    TDAP status: Up to date  Flu Vaccine status: Up to date  Pneumococcal vaccine status: Due, Education has been provided regarding the importance of this vaccine. Advised may receive this vaccine at local pharmacy or Health Dept. Aware to provide a copy of the vaccination record if obtained from local pharmacy or Health Dept. Verbalized acceptance and understanding.  Covid-19 vaccine status: Declined, Education has been provided regarding the importance of this vaccine but patient still declined. Advised may receive this vaccine at local pharmacy or Health Dept.or vaccine clinic. Aware to provide a copy of the vaccination record if obtained from local pharmacy or Health Dept. Verbalized acceptance and understanding.  Qualifies for Shingles Vaccine? Yes   Zostavax completed No   Shingrix Completed?: No.    Education has been provided regarding the importance of this vaccine. Patient has been advised to call insurance company to determine out of pocket expense if they have not yet received this vaccine. Advised may also receive vaccine at local pharmacy or Health Dept. Verbalized acceptance and understanding.  Screening Tests Health Maintenance  Topic Date Due   Zoster Vaccines- Shingrix (1 of 2) Never done   Pneumonia Vaccine 65+ Years old (2 - PCV) 08/03/2021   COVID-19 Vaccine (1) 10/10/2021 (Originally 09/12/1956)   COLONOSCOPY  (Pts 45-74yrs Insurance coverage will need to be confirmed)  01/21/2023   TETANUS/TDAP  12/09/2027   INFLUENZA VACCINE  Completed   Hepatitis C Screening  Completed   HPV VACCINES  Aged Out    Health Maintenance  Health Maintenance Due  Topic Date Due   Zoster Vaccines- Shingrix (1 of 2) Never done   Pneumonia Vaccine 56+ Years old (2 - PCV) 08/03/2021    Colorectal cancer screening: Type of screening: Colonoscopy. Completed 01/20/18. Repeat every 5 years  Additional Screening:  Hepatitis C Screening:  Completed 12/08/17  Vision Screening: Recommended annual ophthalmology exams for early detection of glaucoma and other disorders of the eye. Is the patient up to date with their annual eye exam?  Yes  Who is the provider or what is the name of the office in which the patient attends annual eye exams? [facility] Dr Laban Emperor If pt is not established with a provider, would they like to be referred to a provider to establish care? No .   Dental Screening: Recommended annual dental exams for proper oral hygiene  Community Resource Referral / Chronic Care Management: CRR required this visit?  No   CCM required this visit?  No      Plan:     I have personally reviewed and noted the following in the patients chart:   Medical and social history  Use of alcohol, tobacco or illicit drugs  Current medications and supplements including opioid prescriptions. Patient is not currently taking opioid prescriptions. Functional ability and status Nutritional status Physical activity Advanced directives List of other physicians Hospitalizations, surgeries, and ER visits in previous 12 months Vitals Screenings to include cognitive, depression, and falls Referrals and appointments  In addition, I have reviewed and discussed with patient certain preventive protocols, quality metrics, and best practice recommendations. A written personalized care plan for preventive services as well as general  preventive health recommendations were provided to patient.     Willette Brace, LPN   02/27/140   Nurse Notes: None

## 2021-09-24 NOTE — Patient Instructions (Signed)
Michael [name] , Thank you for taking time to come for your Medicare Wellness Visit. I appreciate your ongoing commitment to your health goals. Please review the following plan we discussed and let me know if I can assist you in the future.   Screening recommendations/referrals: Colonoscopy: done 01/20/18 repeat every 5 years  Recommended yearly ophthalmology/optometry visit for glaucoma screening and checkup Recommended yearly dental visit for hygiene and checkup  Vaccinations: Influenza vaccine: done 08/07/21 repeat every year Pneumococcal vaccine: Due and discussed Tdap vaccine: doe 12/08/17 repeat every 10 years Shingles vaccine: Shingrix discussed. Please contact your pharmacy for coverage information.    Covid-19: declined and discussed  Advanced directives: copies in chart  Conditions/risks identified: none at this time  Next appointment: Follow up in one year for your annual wellness visit.   Preventive Care 67 Years and Older, Male Preventive care refers to lifestyle choices and visits with your health care provider that can promote health and wellness. What does preventive care include? A yearly physical exam. This is also called an annual well check. Dental exams once or twice a year. Routine eye exams. Ask your health care provider how often you should have your eyes checked. Personal lifestyle choices, including: Daily care of your teeth and gums. Regular physical activity. Eating a healthy diet. Avoiding tobacco and drug use. Limiting alcohol use. Practicing safe sex. Taking low doses of aspirin every day. Taking vitamin and mineral supplements as recommended by your health care provider. What happens during an annual well check? The services and screenings done by your health care provider during your annual well check will depend on your age, overall health, lifestyle risk factors, and family history of disease. Counseling  Your health care provider may ask you  questions about your: Alcohol use. Tobacco use. Drug use. Emotional well-being. Home and relationship well-being. Sexual activity. Eating habits. History of falls. Memory and ability to understand (cognition). Work and work Statistician. Screening  You may have the following tests or measurements: Height, weight, and BMI. Blood pressure. Lipid and cholesterol levels. These may be checked every 5 years, or more frequently if you are over 54 years old. Skin check. Lung cancer screening. You may have this screening every year starting at age 67 if you have a 30-pack-year history of smoking and currently smoke or have quit within the past 15 years. Fecal occult blood test (FOBT) of the stool. You may have this test every year starting at age 67. Flexible sigmoidoscopy or colonoscopy. You may have a sigmoidoscopy every 5 years or a colonoscopy every 10 years starting at age 67. Prostate cancer screening. Recommendations will vary depending on your family history and other risks. Hepatitis C blood test. Hepatitis B blood test. Sexually transmitted disease (STD) testing. Diabetes screening. This is done by checking your blood sugar (glucose) after you have not eaten for a while (fasting). You may have this done every 1-3 years. Abdominal aortic aneurysm (AAA) screening. You may need this if you are a current or former smoker. Osteoporosis. You may be screened starting at age 67 if you are at high risk. Talk with your health care provider about your test results, treatment options, and if necessary, the need for more tests. Vaccines  Your health care provider may recommend certain vaccines, such as: Influenza vaccine. This is recommended every year. Tetanus, diphtheria, and acellular pertussis (Tdap, Td) vaccine. You may need a Td booster every 10 years. Zoster vaccine. You may need this after age 37. Pneumococcal 13-valent  conjugate (PCV13) vaccine. One dose is recommended after age  52. Pneumococcal polysaccharide (PPSV23) vaccine. One dose is recommended after age 72. Talk to your health care provider about which screenings and vaccines you need and how often you need them. This information is not intended to replace advice given to you by your health care provider. Make sure you discuss any questions you have with your health care provider. Document Released: 10/05/2015 Document Revised: 05/28/2016 Document Reviewed: 07/10/2015 Elsevier Interactive Patient Education  2017 [place] Prevention in the Home Falls can cause injuries. They can happen to people of all ages. There are many things you can do to make your home safe and to help prevent falls. What can I do on the outside of my home? Regularly fix the edges of walkways and driveways and fix any cracks. Remove anything that might make you trip as you walk through a door, such as a raised step or threshold. Trim any bushes or trees on the path to your home. Use bright outdoor lighting. Clear any walking paths of anything that might make someone trip, such as rocks or tools. Regularly check to see if handrails are loose or broken. Make sure that both sides of any steps have handrails. Any raised decks and porches should have guardrails on the edges. Have any leaves, snow, or ice cleared regularly. Use sand or salt on walking paths during winter. Clean up any spills in your garage right away. This includes oil or grease spills. What can I do in the bathroom? Use night lights. Install grab bars by the toilet and in the tub and shower. Do not use towel bars as grab bars. Use non-skid mats or decals in the tub or shower. If you need to sit down in the shower, use a plastic, non-slip stool. Keep the floor dry. Clean up any water that spills on the floor as soon as it happens. Remove soap buildup in the tub or shower regularly. Attach bath mats securely with double-sided non-slip rug tape. Do not have throw  rugs and other things on the floor that can make you trip. What can I do in the bedroom? Use night lights. Make sure that you have a light by your bed that is easy to reach. Do not use any sheets or blankets that are too big for your bed. They should not hang down onto the floor. Have a firm chair that has side arms. You can use this for support while you get dressed. Do not have throw rugs and other things on the floor that can make you trip. What can I do in the kitchen? Clean up any spills right away. Avoid walking on wet floors. Keep items that you use a lot in easy-to-reach places. If you need to reach something above you, use a strong step stool that has a grab bar. Keep electrical cords out of the way. Do not use floor polish or wax that makes floors slippery. If you must use wax, use non-skid floor wax. Do not have throw rugs and other things on the floor that can make you trip. What can I do with my stairs? Do not leave any items on the stairs. Make sure that there are handrails on both sides of the stairs and use them. Fix handrails that are broken or loose. Make sure that handrails are as long as the stairways. Check any carpeting to make sure that it is firmly attached to the stairs. Fix any carpet that  is loose or worn. Avoid having throw rugs at the top or bottom of the stairs. If you do have throw rugs, attach them to the floor with carpet tape. Make sure that you have a light switch at the top of the stairs and the bottom of the stairs. If you do not have them, ask someone to add them for you. What else can I do to help prevent falls? Wear shoes that: Do not have high heels. Have rubber bottoms. Are comfortable and fit you well. Are closed at the toe. Do not wear sandals. If you use a stepladder: Make sure that it is fully opened. Do not climb a closed stepladder. Make sure that both sides of the stepladder are locked into place. Ask someone to hold it for you, if  possible. Clearly mark and make sure that you can see: Any grab bars or handrails. First and last steps. Where the edge of each step is. Use tools that help you move around (mobility aids) if they are needed. These include: Canes. Walkers. Scooters. Crutches. Turn on the lights when you go into a dark area. Replace any light bulbs as soon as they burn out. Set up your furniture so you have a clear path. Avoid moving your furniture around. If any of your floors are uneven, fix them. If there are any pets around you, be aware of where they are. Review your medicines with your doctor. Some medicines can make you feel dizzy. This can increase your chance of falling. Ask your doctor what other things that you can do to help prevent falls. This information is not intended to replace advice given to you by your health care provider. Make sure you discuss any questions you have with your health care provider. Document Released: 07/05/2009 Document Revised: 02/14/2016 Document Reviewed: 10/13/2014 Elsevier Interactive Patient Education  2017 [employer].

## 2021-09-25 NOTE — Progress Notes (Signed)
Michael [name] [place] [address] [place] Phone: [phone] Subjective:   Michael [name], am serving as a scribe for Dr. Hulan [name]. This visit occurred during the SARS-CoV-2 public health emergency.  Safety protocols were in place, including screening questions prior to the visit, additional usage of staff PPE, and extensive cleaning of exam room while observing appropriate contact time as indicated for disinfecting solutions.   I'm seeing this patient by the request  of:  Michael Olp, MD  CC: Right knee injury [id number]  Michael [name] is a 66 y.o. male coming in with complaint of R knee injury. Patient states wants to know about brace for knee. Thinking about steroid injection. Usually has pain and it will go away. Location starts on the lateral side with a burning sensation and then goes above and below the patella. No feeling of instability. No popping when walking.     Past Medical History:  Diagnosis Date   CAD S/P PCI to Cx & RCA    a. s/p PCI of LCx (2006) and ostium of RCA (2007) as well as DES to RCA in [place] (05/2014)   HTN (hypertension)    Hyperlipidemia    Past Surgical History:  Procedure Laterality Date   CARDIAC CATHETERIZATION N/A 05/06/2016   Procedure: Left Heart Cath and Coronary Angiography;  Surgeon: Michael Bush, MD;  Location: [facility];  Service: Cardiovascular;  Laterality: N/A;   CARDIAC CATHETERIZATION N/A 05/06/2016   Procedure: Coronary Stent Intervention;  Surgeon: Michael Bush, MD;  Location: [facility];  Service: Cardiovascular;  Laterality: N/A;   CARDIAC CATHETERIZATION N/A 05/13/2016   Procedure: Coronary/Graft Angiography;  Surgeon: Michael M Martinique, MD;  Location: [facility];  Service: Cardiovascular;  Laterality: N/A;   CORONARY ANGIOPLASTY WITH STENT PLACEMENT  2006   in [place], occluded CFX, s/p Taxus stent   CORONARY ANGIOPLASTY WITH STENT  PLACEMENT  2007   in [place], ostial RCA stent   CORONARY ANGIOPLASTY WITH STENT PLACEMENT  2015   in [place], RCA stent   TONSILLECTOMY AND ADENOIDECTOMY      Social History   Socioeconomic History   Marital status: Married    Spouse name: Not on file   Number of children: 4   Years of education: Not on file   Highest education level: Not on file  Occupational History   Occupation: retired  Tobacco Use   Smoking status: Never   Smokeless tobacco: Never  Vaping Use   Vaping Use: Never used  Substance and Sexual Activity   Alcohol use: No    Alcohol/week: 0.0 standard drinks   Drug use: No   Sexual activity: Yes  Other Topics Concern   Not on file  Social History Narrative   Lives with wife.  4 kids total (2 step, 2 biological). 1 that has CP. Daughter Michael [name]- comes to [facility]).        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      Hobbies: gardening, lawn care, care for grandkids   Social Determinants of Health   Financial Resource Strain: Low Risk    Difficulty of Paying Living Expenses: Not hard at all  Food Insecurity: No Food Insecurity   Worried About Charity fundraiser in the Last Year: Never true   Arboriculturist in the Last Year: Never true  Transportation Needs: No Transportation Needs   Lack of Transportation (Medical): No  Lack of Transportation (Non-Medical): No  Physical Activity: Inactive   Days of Exercise per Week: 0 days   Minutes of Exercise per Session: 0 min  Stress: No Stress Concern Present   Feeling of Stress : Not at all  Social Connections: Unknown   Frequency of Communication with Friends and Family: More than three times a week   Frequency of Social Gatherings with Friends and Family: More than three times a week   Attends Religious Services: Not on Electrical engineer or Organizations: Not on file   Attends Archivist Meetings: Not on file   Marital Status: Married   No Known  Allergies Family History  Problem Relation Age of Onset   Hypertension Father    Dementia Father        Vascular. patient states dementia/possible alzheimers as well   Hyperlipidemia Sister    Cancer Brother        type unknown   Cancer Brother        type unknown   Heart disease Maternal Grandfather 75       Died probably of heart diseasse   Heart disease Maternal Uncle 22       Died of "heart exploding"   Cerebral palsy Daughter    Stroke Son        severe problems with blood clotting   Colon cancer Neg Hx    Esophageal cancer Neg Hx    Pancreatic cancer Neg Hx    Stomach cancer Neg Hx    Liver disease Neg Hx      Current Outpatient Medications (Cardiovascular):    atorvastatin (LIPITOR) 80 MG tablet, TAKE 1 TABLET DAILY   EPINEPHrine 0.3 mg/0.3 mL IJ SOAJ injection, See admin instructions.   isosorbide mononitrate (IMDUR) 60 MG 24 hr tablet, TAKE 1 TABLET DAILY   nitroGLYCERIN (NITROSTAT) 0.4 MG SL tablet, DISSOLVE 1 TABLET UNDER THE TONGUE EVERY 5 MINUTES AS NEEDED FOR CHEST PAIN UP TO 3 DOSES  Current Outpatient Medications (Respiratory):    cetirizine (ZYRTEC) 10 MG tablet, Take 10 mg by mouth daily.  Current Outpatient Medications (Analgesics):    aspirin 81 MG tablet, Take 81 mg by mouth daily.  Current Outpatient Medications (Hematological):    BRILINTA 90 MG TABS tablet, TAKE 1 TABLET TWICE A DAY  Current Outpatient Medications (Other):    CHERRY PO, Take 50 mg by mouth daily. Cherry extract   Cinnamon 500 MG capsule, Take 500 mg by mouth 2 (two) times daily.   cyclobenzaprine (FLEXERIL) 10 MG tablet, Take 1 tablet (10 mg total) by mouth 2 (two) times daily as needed for muscle spasms.   hydrocortisone (ANUSOL-HC) 2.5 % rectal cream, Place 1 application rectally 2 (two) times daily.   Turmeric 500 MG CAPS, Take 500 mg by mouth daily.   Vitamin D, Ergocalciferol, (DRISDOL) 1.25 MG (50000 UNIT) CAPS capsule, TAKE 1 CAPSULE EVERY 7 DAYS   Reviewed prior  external information including notes and imaging from  primary care provider As well as notes that were available from care everywhere and other healthcare systems.  Past medical history, social, surgical and family history all reviewed in electronic medical record.  No pertanent information unless stated regarding to the chief complaint.   Review of Systems:  No headache, visual changes, nausea, vomiting, diarrhea, constipation, dizziness, abdominal pain, skin rash, fevers, chills, night sweats, weight loss, swollen lymph nodes, body aches, joint swelling, chest pain, shortness of breath, mood changes. POSITIVE muscle aches  Objective  Blood pressure 108/68, pulse 75, height 5\' 6"  (1.676 m), weight 201 lb (91.2 kg), SpO2 98 %.   General: No apparent distress alert and oriented x3 mood and affect normal, dressed appropriately.  HEENT: Pupils equal, extraocular movements intact  Respiratory: Patient's speak in full sentences and does not appear short of breath  Cardiovascular: No lower extremity edema, non tender, no erythema  Gait normal with good balance and coordination.  MSK:   Right knee exam shows patient does have some lateral tracking of the patella noted.  Positive McMurray's noted.  Patient does have tenderness noted.  Trace effusion noted of the patellofemoral joint.  Good range of motion otherwise.  Limited muscular skeletal ultrasound was performed and interpreted by Michael [name], M  Patient's lateral meniscus does have displacement noted of approximately 25% laterally.  Appears to be an acute tear with some hypoechoic changes surrounding the area.  In addition to this significant narrowing of the patellofemoral joint.  Effusion noted of the patellofemoral joint. 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 Right knee was prepped with alcohol swab and utilizing anterolateral  approach, patient's right knee space was injected with 4:1  marcaine 0.5%: Kenalog 40mg /dL. Patient tolerated the procedure well without immediate complications.    Impression and Recommendations:     The above documentation has been reviewed and is accurate and complete [name], DO

## 2021-09-26 ENCOUNTER — Ambulatory Visit (INDEPENDENT_AMBULATORY_CARE_PROVIDER_SITE_OTHER): Payer: Medicare Other | Admitting: Family Medicine

## 2021-09-26 ENCOUNTER — Other Ambulatory Visit: Payer: Self-pay

## 2021-09-26 ENCOUNTER — Ambulatory Visit: Payer: Self-pay

## 2021-09-26 ENCOUNTER — Encounter: Payer: Self-pay | Admitting: Family Medicine

## 2021-09-26 ENCOUNTER — Ambulatory Visit (INDEPENDENT_AMBULATORY_CARE_PROVIDER_SITE_OTHER): Payer: Medicare Other

## 2021-09-26 VITALS — BP 108/68 | HR 75 | Ht 66.0 in | Wt 201.0 lb

## 2021-09-26 DIAGNOSIS — M25561 Pain in right knee: Secondary | ICD-10-CM

## 2021-09-26 DIAGNOSIS — S83281A Other tear of lateral meniscus, current injury, right knee, initial encounter: Secondary | ICD-10-CM

## 2021-09-26 HISTORY — DX: Other tear of lateral meniscus, current injury, right knee, initial encounter: S83.281A

## 2021-09-26 NOTE — Patient Instructions (Addendum)
Xray today Do prescribed exercises at least 3x a week Keep next appointment

## 2021-09-26 NOTE — Assessment & Plan Note (Signed)
Patient did have an acute tear on the lateral aspect.  Mild displacement noted.  Patient also has patellofemoral arthritis.  We will get x-rays to further evaluate.  Triple light given as well today.  Discussed which activities to do which wants to avoid.  Increasing activity slowly.  Will avoid significant twisting motions.  Follow-up with me again in 6 weeks

## 2021-10-16 NOTE — Progress Notes (Signed)
[place] Phone: [phone] Subjective:   Michael [name], am serving as a scribe for Dr. Hulan Saas. This visit occurred during the SARS-CoV-2 public health emergency.  Safety protocols were in place, including screening questions prior to the visit, additional usage of staff PPE, and extensive cleaning of exam room while observing appropriate contact time as indicated for disinfecting solutions.  I'm seeing this patient by the request  of:  [name], MD  CC: Knee pain, neck pain and back pain follow-up  [id number]  09/26/2021 Patient did have an acute tear on the lateral aspect.  Mild displacement noted.  Patient also has patellofemoral arthritis.  We will get x-rays to further evaluate.  Triple light given as well today.  Discussed which activities to do which wants to avoid.  Increasing activity slowly.  Will avoid significant twisting motions.  Follow-up with me again in 6 weeks  Update 10/17/2021 Michael [name] is a 66 y.o. male coming in with complaint of R knee pain. Patient states that he has been improving but still not 100%. Wearing brace for support with activity.  Patient stated is significantly better where he does have some improvement in range of motion.  Still not wearing the brace on a regular activities though.  Patient even went to walking the other day without the brace and did well.    Past Medical History:  Diagnosis Date   CAD S/P PCI to Cx & RCA    a. s/p PCI of LCx (2006) and ostium of RCA (2007) as well as DES to RCA in [place] (05/2014)   HTN (hypertension)    Hyperlipidemia    Past Surgical History:  Procedure Laterality Date   CARDIAC CATHETERIZATION N/A 05/06/2016   Procedure: Left Heart Cath and Coronary Angiography;  Surgeon: Nelva Bush, MD;  Location: [facility];  Service: Cardiovascular;  Laterality: N/A;   CARDIAC CATHETERIZATION N/A 05/06/2016    Procedure: Coronary Stent Intervention;  Surgeon: Nelva Bush, MD;  Location: [facility];  Service: Cardiovascular;  Laterality: N/A;   CARDIAC CATHETERIZATION N/A 05/13/2016   Procedure: Coronary/Graft Angiography;  Surgeon: Peter M Martinique, MD;  Location: [facility];  Service: Cardiovascular;  Laterality: N/A;   CORONARY ANGIOPLASTY WITH STENT PLACEMENT  2006   in [place], occluded CFX, s/p Taxus stent   CORONARY ANGIOPLASTY WITH STENT PLACEMENT  2007   in [place], ostial RCA stent   CORONARY ANGIOPLASTY WITH STENT PLACEMENT  2015   in [place], RCA stent   TONSILLECTOMY AND ADENOIDECTOMY      Social History   Socioeconomic History   Marital status: Married    Spouse name: Not on file   Number of children: 4   Years of education: Not on file   Highest education level: Not on file  Occupational History   Occupation: retired  Tobacco Use   Smoking status: Never   Smokeless tobacco: Never  Vaping Use   Vaping Use: Never used  Substance and Sexual Activity   Alcohol use: [name]    Alcohol/week: 0.0 standard drinks   Drug use: [name]   Sexual activity: Yes  Other Topics Concern   Not on file  Social History Narrative   Lives with wife.  4 kids total (2 step, 2 biological). 1 that has CP. Daughter [name]- comes to [facility]).        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      Hobbies: gardening, lawn care, care for grandkids   Social Determinants of Health   Financial Resource Strain: Low Risk    Difficulty of Paying Living Expenses: Not hard at all  Food Insecurity: [name] Food Insecurity   Worried About Charity fundraiser in the Last Year: Never true   Arboriculturist in the Last Year: Never true  Transportation Needs: [name] Transportation Needs   Lack of Transportation (Medical): [name]   Lack of Transportation (Non-Medical): [name]  Physical Activity: Inactive   Days of Exercise per Week: 0 days   Minutes of Exercise per Session: 0 min   Stress: [name] Stress Concern Present   Feeling of Stress : Not at all  Social Connections: Unknown   Frequency of Communication with Friends and Family: More than three times a week   Frequency of Social Gatherings with Friends and Family: More than three times a week   Attends Religious Services: Not on Electrical engineer or Organizations: Not on file   Attends Archivist Meetings: Not on file   Marital Status: Married   [name] Known Allergies Family History  Problem Relation Age of Onset   Hypertension Father    Dementia Father        Vascular. patient states dementia/possible alzheimers as well   Hyperlipidemia Sister    Cancer Brother        type unknown   Cancer Brother        type unknown   Heart disease Maternal Grandfather 9       Died probably of heart diseasse   Heart disease Maternal Uncle 49       Died of "heart exploding"   Cerebral palsy Daughter    Stroke Son        severe problems with blood clotting   Colon cancer Neg Hx    Esophageal cancer Neg Hx    Pancreatic cancer Neg Hx    Stomach cancer Neg Hx    Liver disease Neg Hx      Current Outpatient Medications (Cardiovascular):    atorvastatin (LIPITOR) 80 MG tablet, TAKE 1 TABLET DAILY   EPINEPHrine 0.3 mg/0.3 mL IJ SOAJ injection, See admin instructions.   isosorbide mononitrate (IMDUR) 60 MG 24 hr tablet, TAKE 1 TABLET DAILY   nitroGLYCERIN (NITROSTAT) 0.4 MG SL tablet, DISSOLVE 1 TABLET UNDER THE TONGUE EVERY 5 MINUTES AS NEEDED FOR CHEST PAIN UP TO 3 DOSES  Current Outpatient Medications (Respiratory):    cetirizine (ZYRTEC) 10 MG tablet, Take 10 mg by mouth daily.  Current Outpatient Medications (Analgesics):    aspirin 81 MG tablet, Take 81 mg by mouth daily.  Current Outpatient Medications (Hematological):    BRILINTA 90 MG TABS tablet, TAKE 1 TABLET TWICE A DAY  Current Outpatient Medications (Other):    CHERRY PO, Take 50 mg by mouth daily. Cherry extract   Cinnamon 500  MG capsule, Take 500 mg by mouth 2 (two) times daily.   cyclobenzaprine (FLEXERIL) 10 MG tablet, Take 1 tablet (10 mg total) by mouth 2 (two) times daily as needed for muscle spasms.   hydrocortisone (ANUSOL-HC) 2.5 % rectal cream, Place 1 application rectally 2 (two) times daily.   Turmeric 500 MG CAPS, Take 500 mg by mouth daily.   Vitamin D, Ergocalciferol, (DRISDOL) 1.25 MG (50000 UNIT) CAPS capsule, TAKE 1 CAPSULE EVERY 7 DAYS   Reviewed prior external information including notes and imaging from  primary care provider As well as notes that were available from care everywhere and other healthcare systems.  Past medical history, social, surgical and family history all reviewed in electronic medical record.  [name] pertanent information unless stated regarding to the chief complaint.   Review of Systems:  [name] headache, visual changes, nausea, vomiting, diarrhea, constipation, dizziness, abdominal pain, skin rash, fevers, chills, night sweats, weight loss, swollen lymph nodes, body aches, joint swelling, chest pain, shortness of breath, mood changes. POSITIVE muscle aches  Objective  Blood pressure 110/72, height 5\' 6"  (1.676 m), weight 195 lb (88.5 kg).   General: [name] apparent distress alert and oriented x3 mood and affect normal, dressed appropriately.  HEENT: Pupils equal, extraocular movements intact  Respiratory: Patient's speak in full sentences and does not appear short of breath  Cardiovascular: [name] lower extremity edema, non tender, [name] erythema  Gait normal with good balance and coordination.  MSK: Patient continues to have some discomfort overall.  Right knee does still have some lateral tracking noted.  Patient does have some mild crepitus noted.  Patient has had negative McMurray's at the moment. Neck exam does have some limited sidebending bilaterally.  Lacks last 10 degrees of extension.  5 out of 5 strength of the upper extremities.  Low back does have significant loss of lordosis.   Patient does have tightness with FABER test right greater than left.  Limited muscular skeletal ultrasound was performed and interpreted by Hulan Saas, M  Limited ultrasound of patient's right knee shows that patient does have trace effusion noted.  Meniscus still has slight degenerative changes but [name] significant displacement noted at the moment.  Patient does have narrowing of the patellofemoral joint.  Otherwise fairly unremarkable. Impression: Interval improvement  Osteopathic findings C4 flexed rotated and side bent left C6 flexed rotated and side bent right T5 extended rotated and side bent right inhaled third rib T6 extended rotated and side bent left L1 flexed rotated and side bent right Sacrum right on right    Impression and Recommendations:    The above documentation has been reviewed and is accurate and complete [name], DO

## 2021-10-17 ENCOUNTER — Encounter: Payer: Self-pay | Admitting: Family Medicine

## 2021-10-17 ENCOUNTER — Ambulatory Visit: Payer: Self-pay

## 2021-10-17 ENCOUNTER — Ambulatory Visit (INDEPENDENT_AMBULATORY_CARE_PROVIDER_SITE_OTHER): Payer: Medicare Other | Admitting: Family Medicine

## 2021-10-17 ENCOUNTER — Other Ambulatory Visit: Payer: Self-pay

## 2021-10-17 VITALS — BP 110/72 | Ht 66.0 in | Wt 195.0 lb

## 2021-10-17 DIAGNOSIS — M25561 Pain in right knee: Secondary | ICD-10-CM

## 2021-10-17 DIAGNOSIS — M9908 Segmental and somatic dysfunction of rib cage: Secondary | ICD-10-CM

## 2021-10-17 DIAGNOSIS — M9904 Segmental and somatic dysfunction of sacral region: Secondary | ICD-10-CM

## 2021-10-17 DIAGNOSIS — M9903 Segmental and somatic dysfunction of lumbar region: Secondary | ICD-10-CM

## 2021-10-17 DIAGNOSIS — M5010 Cervical disc disorder with radiculopathy, unspecified cervical region: Secondary | ICD-10-CM | POA: Diagnosis not present

## 2021-10-17 DIAGNOSIS — S83281A Other tear of lateral meniscus, current injury, right knee, initial encounter: Secondary | ICD-10-CM | POA: Diagnosis not present

## 2021-10-17 DIAGNOSIS — M9901 Segmental and somatic dysfunction of cervical region: Secondary | ICD-10-CM | POA: Diagnosis not present

## 2021-10-17 DIAGNOSIS — M9902 Segmental and somatic dysfunction of thoracic region: Secondary | ICD-10-CM

## 2021-10-17 DIAGNOSIS — G8929 Other chronic pain: Secondary | ICD-10-CM

## 2021-10-17 DIAGNOSIS — M999 Biomechanical lesion, unspecified: Secondary | ICD-10-CM

## 2021-10-17 NOTE — Assessment & Plan Note (Signed)
Patient does have some loss of lordosis of the neck.  Patient does have some tenderness to palpation of the paraspinal musculature.  Discussed increasing activity, home exercises, which activities to do which wants to avoid.  Increase activity slowly.  Follow-up again in 4 to 6 weeks.

## 2021-10-17 NOTE — Assessment & Plan Note (Signed)

## 2021-10-17 NOTE — Assessment & Plan Note (Signed)
Patient is feeling better.  Significant decrease in amount of inflammation.  Does have a patellofemoral arthritis and the chondrocalcinosis as well.  We will continue to monitor.  Worsening pain could be a candidate for viscosupplementation but at the moment patient has been able to start increasing activity.  Patient will follow-up with me again in 4 weeks to further evaluate.  Worsening pain and locking do need to consider the possibility of advanced imaging.

## 2021-10-17 NOTE — Patient Instructions (Signed)
Continue to monitor knee Wear brace with hills Ice after activity See me in 4 weeks

## 2021-11-14 NOTE — Progress Notes (Signed)
Michael [name] [place] [address] [place] Michael [name] Phone: [phone] Subjective:   IVilma [name], am serving as a scribe for Dr. Hulan Saas. This visit occurred during the SARS-CoV-2 public health emergency.  Safety protocols were in place, including screening questions prior to the visit, additional usage of staff PPE, and extensive cleaning of exam room while observing appropriate contact time as indicated for disinfecting solutions.   I'm seeing this patient by the request  of:  [name], MD  CC: Low back and neck pain follow-up, knee pain  [id number]  Michael [name] is a 66 y.o. male coming in with complaint of back and neck pain. OMT 10/17/2021. Patient states same per usual. Doing more swimming than walking now. Wants you to look at right knee again. He is being very cautious with it. Pain is sporadic.  Medications patient has been prescribed: Vit D  Taking:         Reviewed prior external information including notes and imaging from previsou exam, outside providers and external EMR if available.   As well as notes that were available from care everywhere and other healthcare systems.  Past medical history, social, surgical and family history all reviewed in electronic medical record.  No pertanent information unless stated regarding to the chief complaint.   Past Medical History:  Diagnosis Date   CAD S/P PCI to Cx & RCA    a. s/p PCI of LCx (2006) and ostium of RCA (2007) as well as DES to RCA in [place] (05/2014)   HTN (hypertension)    Hyperlipidemia     No Known Allergies   Review of Systems:  No headache, visual changes, nausea, vomiting, diarrhea, constipation, dizziness, abdominal pain, skin rash, fevers, chills, night sweats, weight loss, swollen lymph nodes, body aches, joint swelling, chest pain, shortness of breath, mood changes. POSITIVE muscle aches  Objective  Blood pressure 128/84, pulse 73,  height 5\' 6"  (1.676 m), weight 194 lb (88 kg), SpO2 98 %.   General: No apparent distress alert and oriented x3 mood and affect normal, dressed appropriately.  HEENT: Pupils equal, extraocular movements intact  Respiratory: Patient's speak in full sentences and does not appear short of breath  Cardiovascular: No lower extremity edema, non tender, no erythema  Low back exam does have some loss of lordosis, some tenderness to palpation in the paraspinal musculature.  Patient's neck exam also has some limited sidebending bilaterally. Tightness with straight leg test. Vitiligo  Patient does have some arthritic changes noted. This is of the knee.  Lateral tracking noted.  Minimal tenderness over the medial joint line and more pain over the patellofemoral joint.  The area of the medial meniscus tear.  Osteopathic findings  C2 flexed rotated and side bent right C6 flexed rotated and side bent left T3 extended rotated and side bent right inhaled rib T7 extended rotated and side bent left L2 flexed rotated and side bent right Sacrum right on right    Assessment and Plan:  Degenerative arthritis of right knee Patient does have some underlying degenerative arthritis that I think is also contributing to more of the knee pain.  The patient could be a good candidate for viscosupplementation and we will try to get approval.  Once we have approval we will have patient come back and will hopefully do the sections in the near future.  Patient otherwise we will continue to monitor the knees.  Follow-up with me again as stated.  Cervical disc disorder with radiculopathy of cervical region Discussed with patient icing regimen and home exercises.  Patient given the suggestion of a potential pillow that could be beneficial.  Patient has been swimming more regularly and could be secondary to this contributed.  Encourage patient to do some stretching afterwards.  Responding well though to osteopathic manipulation.   Follow-up again in 6 to 8 weeks   Nonallopathic problems  Decision today to treat with OMT was based on Physical Exam  After verbal consent patient was treated with HVLA, ME, FPR techniques in cervical, rib, thoracic, lumbar, and sacral  areas  Patient tolerated the procedure well with improvement in symptoms  Patient given exercises, stretches and lifestyle modifications  See medications in patient instructions if given  Patient will follow up in 4-8 weeks     The above documentation has been reviewed and is accurate and complete Michael Pulley, DO        Note: This dictation was prepared with Dragon dictation along with smaller phrase technology. Any transcriptional errors that result from this process are unintentional.

## 2021-11-15 ENCOUNTER — Other Ambulatory Visit: Payer: Self-pay

## 2021-11-15 ENCOUNTER — Encounter: Payer: Self-pay | Admitting: Family Medicine

## 2021-11-15 ENCOUNTER — Ambulatory Visit (INDEPENDENT_AMBULATORY_CARE_PROVIDER_SITE_OTHER): Payer: Medicare Other | Admitting: Family Medicine

## 2021-11-15 VITALS — BP 128/84 | HR 73 | Ht 66.0 in | Wt 194.0 lb

## 2021-11-15 DIAGNOSIS — M9904 Segmental and somatic dysfunction of sacral region: Secondary | ICD-10-CM

## 2021-11-15 DIAGNOSIS — M9901 Segmental and somatic dysfunction of cervical region: Secondary | ICD-10-CM | POA: Diagnosis not present

## 2021-11-15 DIAGNOSIS — M9902 Segmental and somatic dysfunction of thoracic region: Secondary | ICD-10-CM | POA: Diagnosis not present

## 2021-11-15 DIAGNOSIS — M9903 Segmental and somatic dysfunction of lumbar region: Secondary | ICD-10-CM

## 2021-11-15 DIAGNOSIS — M5010 Cervical disc disorder with radiculopathy, unspecified cervical region: Secondary | ICD-10-CM

## 2021-11-15 DIAGNOSIS — M1711 Unilateral primary osteoarthritis, right knee: Secondary | ICD-10-CM

## 2021-11-15 DIAGNOSIS — M9908 Segmental and somatic dysfunction of rib cage: Secondary | ICD-10-CM

## 2021-11-15 NOTE — Assessment & Plan Note (Signed)
Discussed with patient icing regimen and home exercises.  Patient given the suggestion of a potential pillow that could be beneficial.  Patient has been swimming more regularly and could be secondary to this contributed.  Encourage patient to do some stretching afterwards.  Responding well though to osteopathic manipulation.  Follow-up again in 6 to 8 weeks

## 2021-11-15 NOTE — Patient Instructions (Signed)
Good to see you! Send me link to [facility] Will call you once you are approved for gel for knee See you again in 6 weeks

## 2021-11-15 NOTE — Assessment & Plan Note (Signed)
Patient does have some underlying degenerative arthritis that I think is also contributing to more of the knee pain.  The patient could be a good candidate for viscosupplementation and we will try to get approval.  Once we have approval we will have patient come back and will hopefully do the sections in the near future.  Patient otherwise we will continue to monitor the knees.  Follow-up with me again as stated.

## 2021-11-24 ENCOUNTER — Encounter: Payer: Self-pay | Admitting: Family Medicine

## 2021-11-27 ENCOUNTER — Other Ambulatory Visit: Payer: Self-pay

## 2021-11-27 ENCOUNTER — Ambulatory Visit (INDEPENDENT_AMBULATORY_CARE_PROVIDER_SITE_OTHER): Payer: Medicare Other | Admitting: Dermatology

## 2021-11-27 DIAGNOSIS — L821 Other seborrheic keratosis: Secondary | ICD-10-CM | POA: Diagnosis not present

## 2021-11-27 DIAGNOSIS — Z1283 Encounter for screening for malignant neoplasm of skin: Secondary | ICD-10-CM | POA: Diagnosis not present

## 2021-11-27 DIAGNOSIS — D1801 Hemangioma of skin and subcutaneous tissue: Secondary | ICD-10-CM | POA: Diagnosis not present

## 2021-11-27 DIAGNOSIS — D485 Neoplasm of uncertain behavior of skin: Secondary | ICD-10-CM | POA: Diagnosis not present

## 2021-11-27 DIAGNOSIS — L304 Erythema intertrigo: Secondary | ICD-10-CM

## 2021-11-27 NOTE — Patient Instructions (Addendum)
Otc triple paste AF ? ? Biopsy, Surgery (Curettage) & Surgery (Excision) Aftercare Instructions ? ?1. Okay to remove bandage in 24 hours ? ?2. Wash area with soap and water ? ?3. Apply Vaseline to area twice daily until healed (Not Neosporin) ? ?4. Okay to cover with a Band-Aid to decrease the chance of infection or prevent irritation from clothing; also it's okay to uncover lesion at home. ? ?5. Suture instructions: return to our office in 7-10 or 10-14 days for a nurse visit for suture removal. Variable healing with sutures, if pain or itching occurs call our office. It's okay to shower or bathe 24 hours after sutures are given. ? ?6. The following risks may occur after a biopsy, curettage or excision: bleeding, scarring, discoloration, recurrence, infection (redness, yellow drainage, pain or swelling). ? ?7. For questions, concerns and results call our office at [facility] before 4pm & Friday before 3pm. Biopsy results will be available in 1 week. ? ?

## 2021-12-07 ENCOUNTER — Encounter: Payer: Self-pay | Admitting: Dermatology

## 2021-12-07 NOTE — Progress Notes (Signed)
? ?  Follow-Up Visit ?  ?Subjective  ?Michael [name] is a 66 y.o. male who presents for the following: Annual Exam (Pt here for annual. Pt has a spot he may want removed on the L flank). ? ?Annual skin examination, growth below the left eye ?Location:  ?Duration:  ?Quality:  ?Associated Signs/Symptoms: ?Modifying Factors:  ?Severity:  ?Timing: ?Context:  ? ?Objective  ?Well appearing patient in no apparent distress; mood and affect are within normal limits. ?General skin examination, no atypical pigmented lesions.  1 possible nonmelanoma skin cancer below the left eye will be biopsied. ? ?Mid Back (5), Scalp ?Brown textured 5 to 6 mm flattopped papules, compatible dermoscopy ? ?Chest - Medial [facility]) ?Multiple 1 mm smooth red dermal papules ? ?Left Malar Cheek ?Verrucous pearly 4 mm exophytic papule ? ? ? ? ? ? ?Left Thigh - Anterior, Right Thigh - Anterior ?None marginated erythema suggestive of irritant dermatitis. ? ? ? ?A full examination was performed including scalp, head, eyes, ears, nose, lips, neck, chest, axillae, abdomen, back, buttocks, bilateral upper extremities, bilateral lower extremities, hands, feet, fingers, toes, fingernails, and toenails. All findings within normal limits unless otherwise noted below. ? ? ?Assessment & Plan  ? ? ?Encounter for screening for malignant neoplasm of skin ? ?Annual skin examination.  Patient encouraged to self examine twice annually.  Continued ultraviolet protection. ? ?Seborrheic keratosis (6) ?Mid Back (5); Scalp ? ?Leave if stable ? ?Cherry angioma ?Chest - Medial [facility]) ? ?No intervention necessary ? ?Neoplasm of uncertain behavior of skin ?Left Malar Cheek ? ?Skin / nail biopsy ?Type of biopsy: tangential   ?Informed consent: discussed and consent obtained   ?Timeout: patient name, date of birth, surgical site, and procedure verified   ?Anesthesia: the lesion was anesthetized in a standard fashion   ?Anesthetic:  1% lidocaine w/ epinephrine 1-100,000 local  infiltration ?Instrument used: flexible razor blade   ?Hemostasis achieved with: ferric subsulfate and electrodesiccation   ?Outcome: patient tolerated procedure well   ?Post-procedure details: wound care instructions given   ? ?Specimen 1 - Surgical pathology ?Differential Diagnosis: r/o skin tag - cautery after biospy  ? ?Check Margins: No ? ?Intertrigo ?Left Thigh - Anterior; Right Thigh - Anterior ? ?We will look for over-the-counter triple paste AF and apply after bathing for 2 weeks.  Contact me if this does not help. ? ? ? ? ? ?I, Lavonna Monarch, MD, have reviewed all documentation for this visit.  The documentation on 12/07/21 for the exam, diagnosis, procedures, and orders are all accurate and complete. ?

## 2021-12-08 NOTE — Progress Notes (Signed)
?  ?Cardiology Office Note ? ? ?Date:  12/09/2021  ? ?ID:  Michael [name], DOB 1956-01-29, MRN [id number] ? ?PCP:  [name], MD  ?Cardiologist:   Minus Breeding, MD ? ? ?Chief Complaint  ?Patient presents with  ? Cardiomyopathy  ? ? ?  ?History of Present Illness: ?Michael [name] is a 66 y.o. male with CAD, HTN and HLD.  He moved from [place].  Cardiac catheterization done prior to moving demonstrated a right coronary artery lesion that was treated with DES.  In September 2016, he was admitted for unstable angina.  Myoview was low risk.  He had angina again in August 2017 and underwent cardiac catheterization that showed patent stent in the RCA and left circumflex with disease in mid LAD.  He received 2 additional drug-eluting stents.  He was discharged but admitted later with similar chest pain and a troponin elevation a week after discharge.  Relook catheter showed patent LAD stents, left circumflex stent and RCA stent.  There was a 70% small diagonal lesion.  It was suspected he likely had coronary spasm.  Nitrate was added. He was readmitted to the hospital on 12/16/2018 with chest pain.  CT angiogram of the chest was negative for PE.  Myoview obtained on 12/17/2018 was intermediate risk study, finding showed large defect of severe severity present in the inferolateral wall consistent with scar, EF 38% with inferior anterolateral akinesis.  This was followed up using an echocardiogram on the same day which showed EF 40 to 45%, severe akinesis of the entire inferior wall.  No further study was recommended.  He was continued on aspirin, statin and Imdur at 60 mg daily. ? ?Since I last saw him he had 1 episode of arm pain and was in the emergency room in April.  This was felt to be musculoskeletal.  He has otherwise done okay. The patient denies any new symptoms such as chest discomfort, neck or arm discomfort. There has been no new shortness of breath, PND or orthopnea. There have been no reported  palpitations, presyncope or syncope.  He is swimming.  He has been a little bit limited because of knee pain. ? ? ?Past Medical History:  ?Diagnosis Date  ? CAD S/P PCI to Cx & RCA   ? a. s/p PCI of LCx (2006) and ostium of RCA (2007) as well as DES to RCA in [place] (05/2014)  ? HTN (hypertension)   ? Hyperlipidemia   ? ? ?Past Surgical History:  ?Procedure Laterality Date  ? CARDIAC CATHETERIZATION N/A 05/06/2016  ? Procedure: Left Heart Cath and Coronary Angiography;  Surgeon: Nelva Bush, MD;  Location: [place];  Service: Cardiovascular;  Laterality: N/A;  ? CARDIAC CATHETERIZATION N/A 05/06/2016  ? Procedure: Coronary Stent Intervention;  Surgeon: Nelva Bush, MD;  Location: [place] [facility];  Service: Cardiovascular;  Laterality: N/A;  ? CARDIAC CATHETERIZATION N/A 05/13/2016  ? Procedure: Coronary/Graft Angiography;  Surgeon: Peter M Martinique, MD;  Location: [place] [facility];  Service: Cardiovascular;  Laterality: N/A;  ? CORONARY ANGIOPLASTY WITH STENT PLACEMENT  2006  ? in [place], occluded CFX, s/p Taxus stent  ? CORONARY ANGIOPLASTY WITH STENT PLACEMENT  2007  ? in [place], ostial RCA stent  ? CORONARY ANGIOPLASTY WITH STENT PLACEMENT  2015  ? in [place], RCA stent  ? TONSILLECTOMY AND ADENOIDECTOMY     ? ? ? ?Current Outpatient Medications  ?Medication Sig Dispense Refill  ? aspirin 81 MG tablet Take 81 mg by  mouth daily.    ? atorvastatin (LIPITOR) 80 MG tablet TAKE 1 TABLET DAILY 90 tablet 3  ? BRILINTA 90 MG TABS tablet TAKE 1 TABLET TWICE A DAY 180 tablet 3  ? CHERRY PO Take 50 mg by mouth daily. Cherry extract    ? Cinnamon 500 MG capsule Take 500 mg by mouth daily.    ? cyclobenzaprine (FLEXERIL) 10 MG tablet Take 1 tablet (10 mg total) by mouth 2 (two) times daily as needed for muscle spasms. 20 tablet 0  ? EPINEPHrine 0.3 mg/0.3 mL IJ SOAJ injection See admin instructions.    ? hydrocortisone (ANUSOL-HC) 2.5 % rectal cream Place 1 application rectally 2 (two)  times daily. 30 g 1  ? isosorbide mononitrate (IMDUR) 60 MG 24 hr tablet TAKE 1 TABLET DAILY 90 tablet 3  ? nitroGLYCERIN (NITROSTAT) 0.4 MG SL tablet DISSOLVE 1 TABLET UNDER THE TONGUE EVERY 5 MINUTES AS NEEDED FOR CHEST PAIN UP TO 3 DOSES 25 tablet 1  ? Turmeric 500 MG CAPS Take 500 mg by mouth daily.    ? Vitamin D, Ergocalciferol, (DRISDOL) 1.25 MG (50000 UNIT) CAPS capsule TAKE 1 CAPSULE EVERY 7 DAYS 12 capsule 3  ? ?No current facility-administered medications for this visit.  ? ? ?Allergies:   Patient has no known allergies.  ? ?ROS:  Please see the history of present illness.   Otherwise, review of systems are positive for none.   All other systems are reviewed and negative.  ? ? ?PHYSICAL EXAM: ?VS:  BP 126/70   Pulse 63   Ht 5' 6.5" (1.689 m)   Wt 194 lb (88 kg)   SpO2 95%   BMI 30.84 kg/m?  , BMI Body mass index is 30.84 kg/m?. ?GENERAL:  Well appearing ?NECK:  No jugular venous distention, waveform within normal limits, carotid upstroke brisk and symmetric, no bruits, no thyromegaly ?LUNGS:  Clear to auscultation bilaterally ?CHEST:  Unremarkable ?HEART:  PMI not displaced or sustained,S1 and S2 within normal limits, no S3, no S4, no clicks, no rubs, no murmurs ?ABD:  Flat, positive bowel sounds normal in frequency in pitch, no bruits, no rebound, no guarding, no midline pulsatile mass, no hepatomegaly, no splenomegaly ?EXT:  2 plus pulses throughout, no edema, no cyanosis no clubbing ? ? ?EKG:  EKG is  ordered today. ?The ekg ordered today demonstrates sinus rhythm, rate 63, axis within normal limits, intervals within normal limits, no acute ST-T wave changes.  Low voltage limb leads.   ? ? ?Recent Labs: ?08/07/2021: ALT 82; BUN 26; Creatinine, Ser 1.13; Hemoglobin 14.8; Platelets 211.0; Potassium 4.6; Sodium 141  ? ? ?Lipid Panel ?   ?Component Value Date/Time  ? CHOL 117 08/07/2021 0918  ? TRIG 77.0 08/07/2021 0918  ? HDL 49.80 08/07/2021 0918  ? CHOLHDL 2 08/07/2021 0918  ? VLDL 15.4 08/07/2021  0918  ? [place] 52 08/07/2021 0918  ? LDLCALC 54 08/03/2020 1448  ? LDLDIRECT 62.0 09/14/2019 0851  ? ?  ? ?Wt Readings from Last 3 Encounters:  ?12/09/21 194 lb (88 kg)  ?11/15/21 194 lb (88 kg)  ?10/17/21 195 lb (88.5 kg)  ?  ? ? ?Other studies Reviewed: ?Additional studies/ records that were reviewed today include: Labs ?Review of the above records demonstrates:   Please see elsewhere in the note.   ? ? ?ASSESSMENT AND PLAN: ? ?Ischemic cardiomyopathy:    The patient's ejection fraction was 45% and I have not checked in 3 years.  He has been taken  off of beta-blockers and ACE inhibitors in the past because of some lower blood pressures.  However, he and I talked about this and if his ejection fraction on repeat is a lower I would want him to at least be back on an ARB. ? ?CAD:  The patient has no new sypmtoms.  No change in therapy.  We will continue with risk reduction. ? ?Hypertension:   BP is normal and will be managed in the context of managing his mildly reduced ejection fraction.  ?  ?Hyperlipidemia:  LDL is 52 with an HDL of 50.  No change in therapy.  ? ?Current medicines are reviewed at length with the patient today.  The patient does not have concerns regarding medicines. ? ?The following changes have been made:   None ? ?Labs/ tests ordered today include:    ? ?Orders Placed This Encounter  ?Procedures  ? EKG 12-Lead  ? ECHOCARDIOGRAM COMPLETE  ? ? ? ?Disposition:   FU with me in 12 months.   ? ? ?Signed, ?Minus Breeding, MD  ?12/09/2021 8:31 AM    ?[place] ? ? ?

## 2021-12-09 ENCOUNTER — Other Ambulatory Visit: Payer: Self-pay

## 2021-12-09 ENCOUNTER — Ambulatory Visit (INDEPENDENT_AMBULATORY_CARE_PROVIDER_SITE_OTHER): Payer: Medicare Other | Admitting: Cardiology

## 2021-12-09 ENCOUNTER — Encounter: Payer: Self-pay | Admitting: Cardiology

## 2021-12-09 VITALS — BP 126/70 | HR 63 | Ht 66.5 in | Wt 194.0 lb

## 2021-12-09 DIAGNOSIS — E785 Hyperlipidemia, unspecified: Secondary | ICD-10-CM | POA: Diagnosis not present

## 2021-12-09 DIAGNOSIS — I255 Ischemic cardiomyopathy: Secondary | ICD-10-CM

## 2021-12-09 DIAGNOSIS — I2510 Atherosclerotic heart disease of native coronary artery without angina pectoris: Secondary | ICD-10-CM | POA: Diagnosis not present

## 2021-12-09 DIAGNOSIS — I10 Essential (primary) hypertension: Secondary | ICD-10-CM

## 2021-12-09 NOTE — Patient Instructions (Signed)
Medication Instructions:  ?Continue same medications ?*If you need a refill on your cardiac medications before your next appointment, please call your pharmacy* ? ? ?Lab Work: ?None ordered ? ? ?Testing/Procedures: ?Echo ? ? ?Follow-Up: ?At [facility], you and your health needs are our priority.  As part of our continuing mission to provide you with exceptional heart care, we have created designated Provider Care Teams.  These Care Teams include your primary Cardiologist (physician) and Advanced Practice Providers (APPs -  Physician Assistants and Nurse Practitioners) who all work together to provide you with the care you need, when you need it. ? ?We recommend signing up for the patient portal called "MyChart".  Sign up information is provided on this After Visit Summary.  MyChart is used to connect with patients for Virtual Visits (Telemedicine).  Patients are able to view lab/test results, encounter notes, upcoming appointments, etc.  Non-urgent messages can be sent to your provider as well.   ?To learn more about what you can do with MyChart, go to [email or web address].   ? ?Your next appointment:  1 year ?  ? ?The format for your next appointment: Office  ? ? ?Provider:  Dr.Hochrein ? ? ?

## 2021-12-18 ENCOUNTER — Ambulatory Visit (HOSPITAL_COMMUNITY): Payer: Medicare Other | Attending: Cardiology

## 2021-12-18 ENCOUNTER — Other Ambulatory Visit: Payer: Self-pay

## 2021-12-18 DIAGNOSIS — I10 Essential (primary) hypertension: Secondary | ICD-10-CM | POA: Diagnosis present

## 2021-12-18 DIAGNOSIS — I2510 Atherosclerotic heart disease of native coronary artery without angina pectoris: Secondary | ICD-10-CM | POA: Diagnosis present

## 2021-12-18 DIAGNOSIS — E785 Hyperlipidemia, unspecified: Secondary | ICD-10-CM

## 2021-12-18 DIAGNOSIS — I255 Ischemic cardiomyopathy: Secondary | ICD-10-CM

## 2021-12-18 LAB — ECHOCARDIOGRAM COMPLETE
Area-P 1/2: 2.62 cm2
S' Lateral: 3.9 cm

## 2021-12-30 ENCOUNTER — Ambulatory Visit: Payer: Medicare Other | Admitting: Family Medicine

## 2022-01-01 NOTE — Progress Notes (Signed)
?Charlann Boxer D.O. ?[facility] ?[place] ?Phone: [phone] ?Subjective:   ?I, [name], am serving as a Education administrator for Dr. Hulan Saas. ?This visit occurred during the SARS-CoV-2 public health emergency.  Safety protocols were in place, including screening questions prior to the visit, additional usage of staff PPE, and extensive cleaning of exam room while observing appropriate contact time as indicated for disinfecting solutions.  ? ?I'm seeing this patient by the request  of:  [name], MD ? ?CC: Back and neck pain follow-up ? ?[id number]  ?Michael [name] is a 66 y.o. male coming in with complaint of back and neck pain. OMT 11/15/2021. Also f/u for R knee pain. Patient states back is the same per usual. Right knee is about the same. Would like gel injection today. No new complaints. ? ?Medications patient has been prescribed: None ? ?Taking: ? ? ?  ? ? ? ? ?Reviewed prior external information including notes and imaging from previsou exam, outside providers and external EMR if available.  ? ?As well as notes that were available from care everywhere and other healthcare systems. ? ?Past medical history, social, surgical and family history all reviewed in electronic medical record.  No pertanent information unless stated regarding to the chief complaint.  ? ?Past Medical History:  ?Diagnosis Date  ? CAD S/P PCI to Cx & RCA   ? a. s/p PCI of LCx (2006) and ostium of RCA (2007) as well as DES to RCA in [place] (05/2014)  ? HTN (hypertension)   ? Hyperlipidemia   ?  ?No Known Allergies ? ? ?Review of Systems: ? No headache, visual changes, nausea, vomiting, diarrhea, constipation, dizziness, abdominal pain, skin rash, fevers, chills, night sweats, weight loss, swollen lymph nodes, body aches, joint swelling, chest pain, shortness of breath, mood changes. POSITIVE muscle aches ? ?Objective  ?Blood pressure 118/74, pulse 80, height '5\' 6"'$  (1.676 m), weight  195 lb (88.5 kg), SpO2 98 %. ?  ?General: No apparent distress alert and oriented x3 mood and affect normal, dressed appropriately.  ?HEENT: Pupils equal, extraocular movements intact  ?Respiratory: Patient's speak in full sentences and does not appear short of breath  ?Cardiovascular: No lower extremity edema, non tender, no erythema  ?Neck exam does have some limited sidebending bilaterally.  Tenderness to palpation in the paraspinal musculature.  Patient does have some tightness noted in the back as well.  Seems to be right greater than left.  Difficulty with FABER test but secondary to more of the knee pain.  Right knee exam does have some arthritic changes noted.  Patient does have trace effusion noted of the patellofemoral joint. ? ? ? ?Osteopathic findings ? ?C5 flexed rotated and side bent left ?T3 extended rotated and side bent right inhaled rib ?T9 extended rotated and side bent left ?L2 flexed rotated and side bent right ?Sacrum right on right ? ? ?After informed written and verbal consent, patient was seated on exam table. Right knee was prepped with alcohol swab and utilizing anterolateral approach, patient's right knee space was injected with 48 mg per 3 mL of Monovisc (sodium hyaluronate) in a prefilled syringe was injected easily into the knee through a 22-gauge needle..Patient tolerated the procedure well without immediate complications. ? ?  ?Assessment and Plan: ? ?Degenerative arthritis of right knee ?Chronic problem with exacerbation ? ?Patient though will hopefully will respond well to the viscosupplementation.  Patient does want to avoid certain activities for the next  3 days we discussed.  Discussed which activities to do and which ones to avoid, increase activity slowly.  Follow-up again in 6 to 8 weeks. ? ?Cervical disc disorder with radiculopathy of cervical region ?Chronic, with mild exacerbation as well.  Some of this could be secondary to patient compensating with palpation of been  walking secondary to the knee.  Discussed icing regimen and home exercises, which activities to do which ones to avoid, increase activity slowly.  Follow-up with me again in 6 to 8 weeks.  ? ?Nonallopathic problems ? ?Decision today to treat with OMT was based on Physical Exam ? ?After verbal consent patient was treated with HVLA, ME, FPR techniques in cervical, rib, thoracic, lumbar, and sacral  areas ? ?Patient tolerated the procedure well with improvement in symptoms ? ?Patient given exercises, stretches and lifestyle modifications ? ?See medications in patient instructions if given ? ?Patient will follow up in 4-8 weeks ? ?  ? ? ?The above documentation has been reviewed and is accurate and complete [name], DO ? ? ? ?  ? ? Note: This dictation was prepared with Dragon dictation along with smaller phrase technology. Any transcriptional errors that result from this process are unintentional.    ?  ?  ? ?

## 2022-01-06 ENCOUNTER — Ambulatory Visit (INDEPENDENT_AMBULATORY_CARE_PROVIDER_SITE_OTHER): Payer: Medicare Other | Admitting: Family Medicine

## 2022-01-06 VITALS — BP 118/74 | HR 80 | Ht 66.0 in | Wt 195.0 lb

## 2022-01-06 DIAGNOSIS — I255 Ischemic cardiomyopathy: Secondary | ICD-10-CM

## 2022-01-06 DIAGNOSIS — M1711 Unilateral primary osteoarthritis, right knee: Secondary | ICD-10-CM | POA: Diagnosis not present

## 2022-01-06 DIAGNOSIS — M9902 Segmental and somatic dysfunction of thoracic region: Secondary | ICD-10-CM

## 2022-01-06 DIAGNOSIS — M9903 Segmental and somatic dysfunction of lumbar region: Secondary | ICD-10-CM | POA: Diagnosis not present

## 2022-01-06 DIAGNOSIS — M5010 Cervical disc disorder with radiculopathy, unspecified cervical region: Secondary | ICD-10-CM

## 2022-01-06 DIAGNOSIS — M9908 Segmental and somatic dysfunction of rib cage: Secondary | ICD-10-CM | POA: Diagnosis not present

## 2022-01-06 DIAGNOSIS — M9901 Segmental and somatic dysfunction of cervical region: Secondary | ICD-10-CM

## 2022-01-06 DIAGNOSIS — M9904 Segmental and somatic dysfunction of sacral region: Secondary | ICD-10-CM

## 2022-01-06 NOTE — Assessment & Plan Note (Signed)
Chronic, with mild exacerbation as well.  Some of this could be secondary to patient compensating with palpation of been walking secondary to the knee.  Discussed icing regimen and home exercises, which activities to do which ones to avoid, increase activity slowly.  Follow-up with me again in 6 to 8 weeks. ?

## 2022-01-06 NOTE — Assessment & Plan Note (Signed)
Chronic problem with exacerbation ? ?Patient though will hopefully will respond well to the viscosupplementation.  Patient does want to avoid certain activities for the next 3 days we discussed.  Discussed which activities to do and which ones to avoid, increase activity slowly.  Follow-up again in 6 to 8 weeks. ?

## 2022-01-06 NOTE — Patient Instructions (Addendum)
Good to see you! ?Gel injection will be stiff for 3 days ?Call if any redness or swelling ?See you again in 6-8 weeks ?

## 2022-01-15 ENCOUNTER — Emergency Department (HOSPITAL_BASED_OUTPATIENT_CLINIC_OR_DEPARTMENT_OTHER)
Admission: EM | Admit: 2022-01-15 | Discharge: 2022-01-15 | Disposition: A | Discharge status: Home / Self Care | Payer: Medicare Other | Attending: Emergency Medicine | Admitting: Emergency Medicine

## 2022-01-15 ENCOUNTER — Other Ambulatory Visit: Payer: Self-pay

## 2022-01-15 ENCOUNTER — Encounter (HOSPITAL_BASED_OUTPATIENT_CLINIC_OR_DEPARTMENT_OTHER): Payer: Self-pay

## 2022-01-15 ENCOUNTER — Emergency Department (HOSPITAL_BASED_OUTPATIENT_CLINIC_OR_DEPARTMENT_OTHER): Payer: Medicare Other | Admitting: Radiology

## 2022-01-15 DIAGNOSIS — M79602 Pain in left arm: Secondary | ICD-10-CM | POA: Insufficient documentation

## 2022-01-15 DIAGNOSIS — Z7982 Long term (current) use of aspirin: Secondary | ICD-10-CM | POA: Insufficient documentation

## 2022-01-15 DIAGNOSIS — I2510 Atherosclerotic heart disease of native coronary artery without angina pectoris: Secondary | ICD-10-CM | POA: Diagnosis not present

## 2022-01-15 LAB — CBC
HCT: 42.1 % (ref 39.0–52.0)
Hemoglobin: 14.6 g/dL (ref 13.0–17.0)
MCH: 32 pg (ref 26.0–34.0)
MCHC: 34.7 g/dL (ref 30.0–36.0)
MCV: 92.3 fL (ref 80.0–100.0)
Platelets: 218 10*3/uL (ref 150–400)
RBC: 4.56 MIL/uL (ref 4.22–5.81)
RDW: 12.7 % (ref 11.5–15.5)
WBC: 7.3 10*3/uL (ref 4.0–10.5)
nRBC: 0 % (ref 0.0–0.2)

## 2022-01-15 LAB — BASIC METABOLIC PANEL
Anion gap: 10 (ref 5–15)
BUN: 22 mg/dL (ref 8–23)
CO2: 25 mmol/L (ref 22–32)
Calcium: 9.8 mg/dL (ref 8.9–10.3)
Chloride: 104 mmol/L (ref 98–111)
Creatinine, Ser: 0.93 mg/dL (ref 0.61–1.24)
GFR, Estimated: >60 mL/min (ref >60)
Glucose, Bld: 92 mg/dL (ref 70–99)
Potassium: 3.9 mmol/L (ref 3.5–5.1)
Sodium: 139 mmol/L (ref 135–145)

## 2022-01-15 LAB — TROPONIN I (HIGH SENSITIVITY)
Troponin I (High Sensitivity): 4 ng/L (ref <18)
Troponin I (High Sensitivity): 5 ng/L (ref <18)

## 2022-01-15 NOTE — ED Provider Notes (Signed)
?  Physical Exam  ?BP 124/72   Pulse 63   Temp 97.8 ?F (36.6 ?C) (Oral)   Resp 14   SpO2 99%  ? ?Physical Exam ? ?Procedures  ?Procedures ? ?ED Course / MDM  ?  ?Medical Decision Making ?Amount and/or Complexity of Data Reviewed ?Labs: ordered. ?Radiology: ordered. ? ? ?Received care of patient from Dr. Roslynn Amble. Please see his note for prior hx, physical and care. Briefly this is a 65yo male with history of CAD who presents with concern for left arm pain. Had similar presentation last April> Pain is not exertional, does change somewhat with arm movements but was also relieved by nitro with both typical and atypical symptoms. Discussed that with his hx of CAD could consider admission for cardiac work up , but also feel he is appropriate for outpatient management given some atypical symptoms and negative troponins. Delta troponin is negative. Normal pulses doubt dissection/PE/arterial thrombus/ACS.  He prefers outpatient management with strict return precautions. Patient discharged in stable condition with understanding of reasons to return and need for close PCP/Cardiology follow up.   ? ? ? ? ?  ?Gareth Morgan, MD ?01/16/22 1002 ? ?

## 2022-01-15 NOTE — ED Triage Notes (Signed)
Pt. C/o left arm discomfort off and on x 2-3 days which is relieved by sl ntg. He tells me he has hx of CAD and has 5 cardiac stents. He sees Dr. Percival Spanish. He is ambulatory, breathing normally and in no distress. ?

## 2022-01-20 ENCOUNTER — Encounter: Payer: Self-pay | Admitting: Family

## 2022-01-20 ENCOUNTER — Telehealth: Payer: Self-pay | Admitting: Family Medicine

## 2022-01-20 ENCOUNTER — Ambulatory Visit (INDEPENDENT_AMBULATORY_CARE_PROVIDER_SITE_OTHER): Payer: Medicare Other | Admitting: Family

## 2022-01-20 VITALS — BP 94/61 | HR 74 | Temp 98.3°F | Ht 66.0 in | Wt 195.1 lb

## 2022-01-20 DIAGNOSIS — R21 Rash and other nonspecific skin eruption: Secondary | ICD-10-CM | POA: Diagnosis not present

## 2022-01-20 DIAGNOSIS — G8929 Other chronic pain: Secondary | ICD-10-CM

## 2022-01-20 DIAGNOSIS — M25512 Pain in left shoulder: Secondary | ICD-10-CM

## 2022-01-20 MED ORDER — TRIAMCINOLONE ACETONIDE 0.1 % EX CREA: 1.0000 "application " | TOPICAL_CREAM | Freq: Two times a day (BID) | CUTANEOUS | 0 refills | Status: AC

## 2022-01-20 NOTE — Telephone Encounter (Signed)
I think 5/12 is reasonable unless he is having worsening symptoms- looks like scheduled 02/06/22 ? ?Prefer in person if possible ?

## 2022-01-20 NOTE — Telephone Encounter (Signed)
See below

## 2022-01-20 NOTE — Patient Instructions (Signed)
It was very nice to see you today! ? ?Call Dr. Tamala Julian with Sports Medicine that you have seen before regarding your left shoulder and have imaging done and to discuss treatment options. ?I have sent a steroid cream to your pharmacy to use on your hands. Clean hands first with soap and water, dry well, then apply a thin layer to the rash twice a day for 1 week. Follow up with your dermatologist if this is not getting better. ? ? ? ?PLEASE NOTE: ? ?If you had any lab tests please let us know if you have not heard back within a few days. You may see your results on MyChart before we have a chance to review them but we will give you a call once they are reviewed by [place]. If we ordered any referrals today, please let us know if you have not heard from their office within the next week.  ? ?Please try these tips to maintain a healthy lifestyle: ? ?Eat most of your calories during the day when you are active. Eliminate processed foods including packaged sweets (pies, cakes, cookies), reduce intake of potatoes, white bread, white pasta, and white rice. Look for whole grain options, oat flour or almond flour. ? ?Each meal should contain half fruits/vegetables, one quarter protein, and one quarter carbs (no bigger than a computer mouse). ? ?Cut down on sweet beverages. This includes juice, soda, and sweet tea. Also watch fruit intake, though this is a healthier sweet option, it still contains natural sugar! Limit to 3 servings daily. ? ?Drink at least 1 glass of water with each meal and aim for at least 8 glasses per day ? ?Exercise at least 150 minutes every week.  ? ?

## 2022-01-20 NOTE — Assessment & Plan Note (Signed)
Chronic - pt advised on conservative tx and to follow up with his ORTHO that he has seen in past for same problem.  ?

## 2022-01-20 NOTE — Progress Notes (Signed)
? ?Subjective:  ? ? ? Patient ID: Michael [name], male    DOB: Feb 16, 1956, 66 y.o.   MRN: [id number] ? ?Chief Complaint  ?Patient presents with  ? Hand Pain  ?  Pt c/o hand rash on inner hands bilateral for about 3 weeks. Has tried benadryl/hydrocortisone cream which helped a little but came back. Does garden work and started itching. Burning sensation and sore at touch.   ? Shoulder Injury  ?  Pt c/o Left shoulder pain for months, states it feels like bruising and could be sports injury. Starts from elbow and radiating upward. Would like a MRI.    ? ?HPI: ?Dermatitis: Patient complains of a rash. Symptoms began  months ago. Patient describes the rash as erythematous. Characteristics of rash and associated history: Similar rash in the past? no, Is rash pruritic?  yes, Is rash painful?  mild burning pain. Patient's previous dermatologic history includes vitiligo and pt is established with DERM . Medications currently using: hydrocortisone. Environmental exposures or allergies: none ?Shoulder Pain: Patient complaints of left shoulder pain. This is evaluated as a personal injury. The pain is described as aching.  The onset of the pain was several months ago.  The pain occurs  intermittently  and lasts  hours.  Location is anterior. No history of dislocation. Symptoms are aggravated by all activities. Symptoms are diminished by  rest.   Limited activities include: exercise:swimming. mild stiffness, no swelling is reported.  ?   ?Assessment & Plan:  ? ?Problem List Items Addressed This Visit   ? ?  ? Other  ? Left shoulder pain  ?  Chronic - pt advised on conservative tx including alternating heat and ice up to 59mn. tid, analgesic creams, tylenol '650mg'$  tid prn,  no activity using shoulder for 1-2 weeks, and to follow up with his ORTHO that he has seen in past for same problem.  ? ?  ?  ? ?Other Visit Diagnoses   ? ? Rash of both hands    -  [facility] not have current rash. He has tried  hydrocortisone and it does get a little better but then returns. Advised on wearing gloves with any activity involving chemicals, solvents, etc.  Sending Triamcinolone, advised on use & SE and to f/u with DERM if not resolving. ? ?Relevant Medications  ? triamcinolone cream (KENALOG) 0.1 %  ? ?  ? ?Outpatient Medications Prior to Visit  ?Medication Sig Dispense Refill  ? aspirin 81 MG tablet Take 81 mg by mouth daily.    ? atorvastatin (LIPITOR) 80 MG tablet TAKE 1 TABLET DAILY 90 tablet 3  ? BRILINTA 90 MG TABS tablet TAKE 1 TABLET TWICE A DAY 180 tablet 3  ? CHERRY PO Take 50 mg by mouth daily. Cherry extract    ? Cinnamon 500 MG capsule Take 500 mg by mouth daily.    ? cyclobenzaprine (FLEXERIL) 10 MG tablet Take 1 tablet (10 mg total) by mouth 2 (two) times daily as needed for muscle spasms. 20 tablet 0  ? EPINEPHrine 0.3 mg/0.3 mL IJ SOAJ injection See admin instructions.    ? hydrocortisone (ANUSOL-HC) 2.5 % rectal cream Place 1 application rectally 2 (two) times daily. 30 g 1  ? isosorbide mononitrate (IMDUR) 60 MG 24 hr tablet TAKE 1 TABLET DAILY 90 tablet 3  ? nitroGLYCERIN (NITROSTAT) 0.4 MG SL tablet DISSOLVE 1 TABLET UNDER THE TONGUE EVERY 5 MINUTES AS NEEDED FOR CHEST PAIN UP  TO 3 DOSES 25 tablet 1  ? Turmeric 500 MG CAPS Take 500 mg by mouth daily.    ? Vitamin D, Ergocalciferol, (DRISDOL) 1.25 MG (50000 UNIT) CAPS capsule TAKE 1 CAPSULE EVERY 7 DAYS 12 capsule 3  ? ?No facility-administered medications prior to visit.  ? ? ?Past Medical History:  ?Diagnosis Date  ? Acute lateral meniscal tear, right, initial encounter 09/26/2021  ? Injection given September 26, 2018.  ? CAD S/P PCI to Cx & RCA   ? a. s/p PCI of LCx (2006) and ostium of RCA (2007) as well as DES to RCA in [place] (05/2014)  ? HTN (hypertension)   ? Hyperlipidemia   ? Plantar fasciitis 10/12/2017  ? ? ?Past Surgical History:  ?Procedure Laterality Date  ? CARDIAC CATHETERIZATION N/A 05/06/2016  ? Procedure: Left Heart Cath and Coronary  Angiography;  Surgeon: Nelva Bush, MD;  Location: [facility];  Service: Cardiovascular;  Laterality: N/A;  ? CARDIAC CATHETERIZATION N/A 05/06/2016  ? Procedure: Coronary Stent Intervention;  Surgeon: Nelva Bush, MD;  Location: [facility];  Service: Cardiovascular;  Laterality: N/A;  ? CARDIAC CATHETERIZATION N/A 05/13/2016  ? Procedure: Coronary/Graft Angiography;  Surgeon: Peter M Martinique, MD;  Location: [facility];  Service: Cardiovascular;  Laterality: N/A;  ? CORONARY ANGIOPLASTY WITH STENT PLACEMENT  2006  ? in [place], occluded CFX, s/p Taxus stent  ? CORONARY ANGIOPLASTY WITH STENT PLACEMENT  2007  ? in [place], ostial RCA stent  ? CORONARY ANGIOPLASTY WITH STENT PLACEMENT  2015  ? in [place], RCA stent  ? TONSILLECTOMY AND ADENOIDECTOMY     ? ? ?No Known Allergies ? ?   ?Objective:  ?  ?Physical Exam ?Vitals and nursing note reviewed.  ?Constitutional:   ?   General: He is not in acute distress. ?   Appearance: Normal appearance.  ?HENT:  ?   Head: Normocephalic.  ?Cardiovascular:  ?   Rate and Rhythm: Normal rate and regular rhythm.  ?Pulmonary:  ?   Effort: Pulmonary effort is normal.  ?   Breath sounds: Normal breath sounds.  ?Musculoskeletal:     ?   General: Normal range of motion.  ?   Cervical back: Normal range of motion.  ?Skin: ?   General: Skin is warm and dry.  ?   Findings: Erythema and rash present. Rash is not crusting, scaling or vesicular.  ?   Comments: rash on bilateral palmar surface lateral side with warmth  ?Neurological:  ?   Mental Status: He is alert and oriented to person, place, and time.  ?Psychiatric:     ?   Mood and Affect: Mood normal.  ? ? ?BP 94/61 (BP Location: Left Arm, Patient Position: Sitting, Cuff Size: Large)   Pulse 74   Temp 98.3 ?F (36.8 ?C) (Temporal)   Ht '5\' 6"'$  (1.676 m)   Wt 195 lb 2 oz (88.5 kg)   SpO2 97%   BMI 31.49 kg/m?  ?Wt Readings from Last 3 Encounters:  ?01/20/22 195 lb 2 oz (88.5 kg)  ?01/06/22 195  lb (88.5 kg)  ?12/09/21 194 lb (88 kg)  ?   ? ?Jeanie Sewer, NP ? ?

## 2022-01-20 NOTE — Telephone Encounter (Signed)
Pt is requesting a HFU. ? ?Can this be virtual? ? ?Should this pt be worked in sooner than first available of Friday (05/12)? ? ?FYI: pt is seeing another provider today (05/01) at 1:20pm for possible allergic reaction on hands. ?

## 2022-01-21 ENCOUNTER — Ambulatory Visit (INDEPENDENT_AMBULATORY_CARE_PROVIDER_SITE_OTHER): Payer: Medicare Other | Admitting: Family Medicine

## 2022-01-21 ENCOUNTER — Ambulatory Visit (INDEPENDENT_AMBULATORY_CARE_PROVIDER_SITE_OTHER): Payer: Medicare Other

## 2022-01-21 VITALS — BP 142/84 | HR 82 | Wt 195.8 lb

## 2022-01-21 DIAGNOSIS — I255 Ischemic cardiomyopathy: Secondary | ICD-10-CM

## 2022-01-21 DIAGNOSIS — M5010 Cervical disc disorder with radiculopathy, unspecified cervical region: Secondary | ICD-10-CM

## 2022-01-21 NOTE — Progress Notes (Signed)
? ?  I, Peterson Lombard, LAT, ATC acting as a scribe for Lynne Leader, MD. ? ?Michael [name] is a 66 y.o. male who presents to [place] [facility] today for L upper arm pain. Pt was previously seen by Dr. Tamala Julian on 01/06/22 for OMT of the neck and spine. Today, pt c/o L upper arm pain ongoing since mid-April. Pt thinks pain is related to increased swimming and pain was intermittent, even at rest. Pt locates pain to the lateral aspect of the L upper arm Pt was seen at the ED on 01/19/21 and again a year later, 01/15/22. Pt is concerned the pain in her L upperarm is cardiac related. ? ?Neck pain: no- last week pain on the R trap area ?Radiates: no ?UE Numbness/tingling: no ?UE Weakness: no ?Aggravates: increased activity ?Treatments tried: ice, heat, creams, Tylenol, ? ?Dx testing: 01/15/22 Chest XR ? 01/15/22 Labs ? 12/18/21 Echocardiogram ? 02/08/21 Chest CT ? 01/24/21 L shoulder XR ? 01/19/21 Labs ? 09/05/20 C-spine XR ? 09/14/19 R ribs w/ chest XR ? 08/05/19 Chest XR ? ?Pertinent review of systems: No fevers or chills ? ?Relevant historical information: Heart disease. ? ? ?Exam:  ?BP (!) 142/84   Pulse 82   Wt 195 lb 12.8 oz (88.8 kg)   SpO2 98%   BMI 31.60 kg/m?  ?General: Well Developed, well nourished, and in no acute distress.  ? ?MSK: C-spine: Normal-appearing ?Nontender midline. ?Decreased cervical motion. ?Positive left-sided Spurling's test. ?Upper extremity strength is intact. ? ?Left shoulder normal-appearing ?Nontender. ?Normal shoulder motion. ?Intact strength. ?Negative Hawkins and Neer's test. ? ? ? ?Lab and Radiology Results ? ?X-ray images C-spine obtained today personally and independently interpreted ?Diffuse lower cervical DDD without acute fractures. ?Posterior cervical calcifications present. ?Await formal radiology review ? ? ? ?Assessment and Plan: ?66 y.o. male with left cervical radiculopathy likely explains his upper arm pain.  He has a positive Spurling's test and  negative shoulder rotator cuff impingement testing.  Plan for physical therapy.  We discussed medical treatment and would like to avoid prednisone if possible.  He notes gabapentin makes him feel weird so would like to avoid that too.  Certainly more to do if needed but for now physical therapy is a good starting point.  Plan to recheck in 6 weeks. ? ? ?PDMP not reviewed this encounter. ?Orders Placed This Encounter  ?Procedures  ? DG Cervical Spine Complete  ?  Order Specific Question:   Reason for Exam (SYMPTOM  OR DIAGNOSIS REQUIRED)  ?  Answer:   cervical radiculopathy  ?  Order Specific Question:   Preferred imaging location?  ?  Answer:   [facility]  ? Ambulatory referral to Physical Therapy  ?  Referral Priority:   Routine  ?  Referral Type:   Physical Medicine  ?  Referral Reason:   Specialty Services Required  ?  Requested Specialty:   Physical Therapy  ?  Number of Visits Requested:   1  ? ?No orders of the defined types were placed in this encounter. ? ? ? ?Discussed warning signs or symptoms. Please see discharge instructions. Patient expresses understanding. ? ? ?The above documentation has been reviewed and is accurate and complete Lynne Leader, M.D. ? ? ?

## 2022-01-21 NOTE — Patient Instructions (Addendum)
Thank you for coming in today.  ? ?Please get an Xray today before you leave  ? ?I've referred you to Physical Therapy.  Let us know if you don't hear from them in one week.  ? ?Recheck back in 2 months ?

## 2022-01-23 NOTE — ED Provider Notes (Signed)
?[facility] ?Provider Note ? ? ?CSN: [id number] ?Arrival date & time: 01/15/22  1343 ? ?  ? ?History ? ?Chief Complaint  ?Patient presents with  ? Arm Pain  ? ? ?Parris Cudworth is a 66 y.o. male.  Presented to ER due to concern for arm pain.  States that he has had left arm discomfort for the past couple days.  Seems to come and go.  Not associated with exertion.  Has tried nitroglycerin which seems to have helped.  He does not endorse history of coronary artery disease requiring stent placement.  Followed by Dr. Norman Herrlich.  Currently he denies any ongoing pain.  Denies any associated difficulty in breathing. ? ?HPI ? ?  ? ?Home Medications ?Prior to Admission medications   ?Medication Sig Start Date End Date Taking? Authorizing Provider  ?aspirin 81 MG tablet Take 81 mg by mouth daily.    [provider]  ?atorvastatin (LIPITOR) 80 MG tablet TAKE 1 TABLET DAILY 03/26/21   Minus Breeding, MD  ?BRILINTA 90 MG TABS tablet TAKE 1 TABLET TWICE A DAY 04/22/21   Minus Breeding, MD  ?CHERRY PO Take 50 mg by mouth daily. Cherry extract    [provider]  ?Cinnamon 500 MG capsule Take 500 mg by mouth daily.    [provider]  ?EPINEPHrine 0.3 mg/0.3 mL IJ SOAJ injection See admin instructions. 04/25/21   [provider]  ?hydrocortisone (ANUSOL-HC) 2.5 % rectal cream Place 1 application rectally 2 (two) times daily. 06/08/20   Zehr, Laban Emperor, PA-C  ?isosorbide mononitrate (IMDUR) 60 MG 24 hr tablet TAKE 1 TABLET DAILY 05/14/21   Minus Breeding, MD  ?nitroGLYCERIN (NITROSTAT) 0.4 MG SL tablet DISSOLVE 1 TABLET UNDER THE TONGUE EVERY 5 MINUTES AS NEEDED FOR CHEST PAIN UP TO 3 DOSES 11/28/19   Minus Breeding, MD  ?triamcinolone cream (KENALOG) 0.1 % Apply 1 application. topically 2 (two) times daily for 10 days. 01/20/22 01/30/22  Jeanie Sewer, NP  ?Turmeric 500 MG CAPS Take 500 mg by mouth daily.    [provider]  ?Vitamin D, Ergocalciferol, (DRISDOL)  1.25 MG (50000 UNIT) CAPS capsule TAKE 1 CAPSULE EVERY 7 DAYS 08/29/21   [name], DO  ?   ? ?Allergies    ?Patient has no known allergies.   ? ?Review of Systems   ?Review of Systems  ?Constitutional:  Negative for chills and fever.  ?HENT:  Negative for ear pain and sore throat.   ?Eyes:  Negative for pain and visual disturbance.  ?Respiratory:  Negative for cough, chest tightness and shortness of breath.   ?Cardiovascular:  Negative for chest pain and palpitations.  ?Gastrointestinal:  Negative for abdominal pain and vomiting.  ?Genitourinary:  Negative for dysuria and hematuria.  ?Musculoskeletal:  Positive for arthralgias. Negative for back pain.  ?Skin:  Negative for color change and rash.  ?Neurological:  Negative for seizures and syncope.  ?All other systems reviewed and are negative. ? ?Physical Exam ?Updated Vital Signs ?BP 140/84 (BP Location: Left Arm)   Pulse 63   Temp 97.9 ?F (36.6 ?C) (Oral)   Resp 15   SpO2 100%  ?Physical Exam ?Vitals and nursing note reviewed.  ?Constitutional:   ?   General: He is not in acute distress. ?   Appearance: He is well-developed.  ?HENT:  ?   Head: Normocephalic and atraumatic.  ?Eyes:  ?   Conjunctiva/sclera: Conjunctivae normal.  ?Cardiovascular:  ?   Rate and Rhythm: Normal rate and  regular rhythm.  ?   Heart sounds: No murmur heard. ?Pulmonary:  ?   Effort: Pulmonary effort is normal. No respiratory distress.  ?   Breath sounds: Normal breath sounds.  ?Abdominal:  ?   Palpations: Abdomen is soft.  ?   Tenderness: There is no abdominal tenderness.  ?Musculoskeletal:     ?   General: No swelling.  ?   Cervical back: Neck supple.  ?   Comments: LUE: No deformity to the left arm, normal strength and sensation throughout arm, normal radial pulse  ?Skin: ?   General: Skin is warm and dry.  ?   Capillary Refill: Capillary refill takes less than 2 seconds.  ?Neurological:  ?   Mental Status: He is alert.  ?Psychiatric:     ?   Mood and Affect: Mood normal.   ? ? ?ED Results / Procedures / Treatments   ?Labs ?(all labs ordered are listed, but only abnormal results are displayed) ?Labs Reviewed  ?BASIC METABOLIC PANEL  ?CBC  ?TROPONIN I (HIGH SENSITIVITY)  ?TROPONIN I (HIGH SENSITIVITY)  ? ? ?EKG ?EKG Interpretation ? ?Date/Time:  Wednesday January 15 2022 14:02:50 EDT ?Ventricular Rate:  73 ?PR Interval:  155 ?QRS Duration: 109 ?QT Interval:  412 ?QTC Calculation: 378 ?R Axis:   58 ?Text Interpretation: Sinus rhythm No significant change since last tracing Confirmed by Gareth Morgan [phone]) on 01/15/2022 5:20:35 PM ? ?Radiology ?No results found. ? ?Procedures ?Procedures  ? ? ?Medications Ordered in ED ?Medications - No data to display ? ?ED Course/ Medical Decision Making/ A&P ?  ?                        ?Medical Decision Making ?Amount and/or Complexity of Data Reviewed ?Labs: ordered. ?Radiology: ordered. ? ? ?66 year old male presents for left arm discomfort.  Does endorse CAD history.  On physical exam he is well-appearing in no distress.  His initial EKG does not demonstrate any acute ischemic change.  He is neurovascularly intact.  He does have some positional nature to the pain which would suggest MSK.  However given his CAD history, feel he would benefit from cardiac work-up today.  Have ordered basic labs, troponin, CXR.  While awaiting further work-up, signed out to Dr. Billy Fischer who will follow-up on tests and reassess patient. ? ? ? ? ? ? ? ? ?Final Clinical Impression(s) / ED Diagnoses ?Final diagnoses:  ?Left arm pain  ? ? ?Rx / DC Orders ?ED Discharge Orders   ? ? None  ? ?  ? ? ?  ?Lucrezia Starch, MD ?01/23/22 1545 ? ?

## 2022-01-23 NOTE — Progress Notes (Signed)
Cervical spine x-ray shows some diffuse arthritis changes in the neck.  This is unchanged from prior imaging of the cervical spine.  No fractures are visible.

## 2022-01-28 NOTE — Therapy (Signed)
?OUTPATIENT PHYSICAL THERAPY CERVICAL EVALUATION ? ? ?Patient Name: Michael [name] ?MRN: [id number] ?DOB:22-Jun-1956, 66 y.o., male ?Today's Date: 01/29/2022 ? ? PT End of Session - 01/29/22 0759   ? ? Visit Number 1   ? Number of Visits 12   ? Date for PT Re-Evaluation 03/12/22   ? PT Start Time 0801   ? PT Stop Time [phone]   ? PT Time Calculation (min) 38 min   ? ?  ?  ? ?  ? ? ?Past Medical History:  ?Diagnosis Date  ? Acute lateral meniscal tear, right, initial encounter 09/26/2021  ? Injection given September 26, 2018.  ? CAD S/P PCI to Cx & RCA   ? a. s/p PCI of LCx (2006) and ostium of RCA (2007) as well as DES to RCA in [place] (05/2014)  ? HTN (hypertension)   ? Hyperlipidemia   ? Plantar fasciitis 10/12/2017  ? ?Past Surgical History:  ?Procedure Laterality Date  ? CARDIAC CATHETERIZATION N/A 05/06/2016  ? Procedure: Left Heart Cath and Coronary Angiography;  Surgeon: Nelva Bush, MD;  Location: [facility];  Service: Cardiovascular;  Laterality: N/A;  ? CARDIAC CATHETERIZATION N/A 05/06/2016  ? Procedure: Coronary Stent Intervention;  Surgeon: Nelva Bush, MD;  Location: [facility];  Service: Cardiovascular;  Laterality: N/A;  ? CARDIAC CATHETERIZATION N/A 05/13/2016  ? Procedure: Coronary/Graft Angiography;  Surgeon: Peter M Martinique, MD;  Location: [facility];  Service: Cardiovascular;  Laterality: N/A;  ? CORONARY ANGIOPLASTY WITH STENT PLACEMENT  2006  ? in [place], occluded CFX, s/p Taxus stent  ? CORONARY ANGIOPLASTY WITH STENT PLACEMENT  2007  ? in [place], ostial RCA stent  ? CORONARY ANGIOPLASTY WITH STENT PLACEMENT  2015  ? in [place], RCA stent  ? TONSILLECTOMY AND ADENOIDECTOMY     ? ?Patient Active Problem List  ? Diagnosis Date Noted  ? Degenerative arthritis of right knee 11/15/2021  ? Cervical disc disorder with radiculopathy of cervical region 03/15/2021  ? Left shoulder pain 01/24/2021  ? AC (acromioclavicular) arthritis 01/24/2021  ? Abnormal chest  x-ray 01/24/2021  ? Right carpal tunnel syndrome 09/05/2020  ? Perianal irritation 06/08/2020  ? Ischemic cardiomyopathy 12/05/2019  ? History of adenomatous polyp of colon 06/07/2018  ? BPH (benign prostatic hyperplasia) 06/07/2018  ? Vitiligo 06/07/2018  ? Gluteal pain 04/10/2018  ? Back pain 04/13/2017  ? Nonallopathic lesion of thoracic region 04/13/2017  ? Nonallopathic lesion of sacral region 04/13/2017  ? Nonallopathic lesion of lumbosacral region 04/13/2017  ? Impaired glucose tolerance 11/03/2016  ? Obesity   ? HTN (hypertension)   ? CAD S/P multiple PCIs 09/08/2014  ? Dyslipidemia 09/08/2014  ? ? ?PCP: Garret Reddish, MD ? ?REFERRING PROVIDER: Gregor Hams, MD ? ?REFERRING DIAG: M50.10 (ICD-10-CM) - Cervical disc disorder with radiculopathy of cervical region ? ?THERAPY DIAG:  ?Cervicalgia ? ?Pain in left arm ? ?ONSET DATE: Since April 2023 ? ?SUBJECTIVE:                                                                                                                                                                                                        ? ?  SUBJECTIVE STATEMENT: ?States that he has been going to the gym at [employer] and the resistance training was bothering his shoulders and then he started swimming. States that his pain started in his shoulder and then he started having burning in his hans down the center and hypothenar eminence. This comes  and goes. States he was concerned it was cardiac issues but he was cleared with that. States that hurts on the side of the elbow. Reports he also has tenderness along the side of his chest wall on the left. States that Dr. Tamala Julian had him on [facility] and he recently went off it and realized that he started having more pains since then.  ? ? ?PERTINENT HISTORY:  ?History of cardiac issues x5 stents, low back pain and neck pain, torn right meniscus getting gel shots ? ?PAIN:  ?Are you having pain? Yes: NPRS scale: 2/10 ?Pain location: left shoulder lateral   ?Pain description: chirping, sore ?Aggravating factors: ends of the day/ after doing a lot of things ?Relieving factors: rest ? ?PRECAUTIONS: None ? ?WEIGHT BEARING RESTRICTIONS No ? ?FALLS:  ?Has patient fallen in last 6 months? No ? ?OCCUPATION: retired, enjoys walking and swimming  ? ?PLOF: Independent ? ?PATIENT GOALS to make it go away so he can go swimming  ? ?OBJECTIVE:  ? ?DIAGNOSTIC FINDINGS:  ?Cervical Xray: 01/22/22 ?IMPRESSION: ?1. Diffuse multilevel degenerative change again noted. 2 mm ?anterolisthesis C4 on C5, most likely degenerative. Multifocal ?bilateral neural foraminal narrowing again noted. Similar findings ?noted on prior exam. No evidence of fracture. ?  ?2.  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 ? ? ? ?COGNITION: ?Overall cognitive status: Within functional limits for tasks assessed ? ? ?SENSATION: ?WFL ? ?POSTURE:  ?Sacral sitting, forward head, rounded shoulders ? ?PALPATION: ?Increased resting tone on left shoulder girdle with tenderness along UT, deltoid, pecs on right  ? ?CERVICAL ROM:  ? ?Active ROM A/PROM (deg) ?01/29/2022  ?Flexion WFL  ?Extension WFL   ?Right lateral flexion   ?Left lateral flexion   ?Right rotation 60 - tight  ?Left rotation 50 - tight  ? (Blank rows = not tested) ? ? ? UE Measurements ?Upper Extremity Right ?01/29/2022 Left ?01/29/2022  ? A/PROM MMT A/PROM MMT  ?Shoulder Flexion 170  170   ?Shoulder Extension [facility]  WFL *   ?Shoulder Abduction      ?Shoulder Adduction      ?Shoulder Internal Rotation T L5 SP - tight  T12 SP    ?Shoulder External Rotation T2 SP  Top of right shoulder - tight*   ?Elbow Flexion      ?Elbow Extension      ?Wrist Flexion      ?Wrist Extension      ?Wrist Supination      ?Wrist Pronation      ?Wrist Ulnar Deviation      ?Wrist Radial Deviation      ?Grip Strength NA  NA   ?  (Blank rows = not tested) ?  * pain ?Right hand dominant ? ?CERVICAL SPECIAL TESTS:  ?Spurling's negative ?Distraction positive for relief in supine ?Ulnar tension, median  and radial nerve tests on Left ? ? ? ?TODAY'S TREATMENT:  ?01/29/2022 ?Therapeutic Exercise: ? Aerobic: ?Supine: ?Prone: ? Seated: ? Standing: median nerve glide x10, corner pec stretch - not tolerated - door pec stretch unilateral x5 15" holds bilateral, shoulder exten at wall with good posture x10 5" holds ?Neuromuscular Re-education: ?Manual Therapy: ?Therapeutic Activity: ?Self Care: ?Trigger Point Dry Needling:  ?  Modalities:  ? ? ? ? ?PATIENT EDUCATION:  ?Education details: on current presentation, on HEP, on clinical outcomes score and POC ?Person educated: Patient ?Education method: Explanation, Demonstration, and Handouts ?Education comprehension: verbalized understanding ? ? ?HOME EXERCISE PROGRAM: ?[id number] ? ?ASSESSMENT: ? ?CLINICAL IMPRESSION: ?Patient is a 66 y.o. male who was seen today for physical therapy evaluation and treatment for left cervical radiculopathy. Patient with positive neural tension and postural restrictions that are contributing to current presentation. Answered all questions added exercises to HEP. Patient would greatly benefit from skilled PT to  improve overall function and return him to optimal function. ? ? ?OBJECTIVE IMPAIRMENTS decreased activity tolerance, decreased ROM, decreased strength, postural dysfunction, and pain.  ? ?ACTIVITY LIMITATIONS community activity and working out .  ? ?PERSONAL FACTORS Age, Fitness, and 1-2 comorbidities: right knee pain, cardiac history  are also affecting patient's functional outcome.  ? ? ?REHAB POTENTIAL: Good ? ?CLINICAL DECISION MAKING: Stable/uncomplicated ? ?EVALUATION COMPLEXITY: Low ? ? ?GOALS: ?Goals reviewed with patient?  yes ? ?SHORT TERM GOALS: ? ?Patient will be independent in self management strategies to improve quality of life and functional outcomes. ?Baseline: new program ?Target date: 02/19/2022 ?Goal status: INITIAL ? ?2.  Patient will report at least 50% improvement in overall symptoms and/or function to demonstrate  improved functional mobility ?Baseline: 0% ?Target date: 02/19/2022 ?Goal status: INITIAL ? ?3.  Patient will report radicular symptoms in left UE to be at least 50% less ?Baseline: 0% better ?Target date: 5/31/2

## 2022-01-29 ENCOUNTER — Encounter: Payer: Self-pay | Admitting: Physical Therapy

## 2022-01-29 ENCOUNTER — Ambulatory Visit (INDEPENDENT_AMBULATORY_CARE_PROVIDER_SITE_OTHER): Payer: Medicare Other | Admitting: Physical Therapy

## 2022-01-29 DIAGNOSIS — M79602 Pain in left arm: Secondary | ICD-10-CM | POA: Diagnosis not present

## 2022-01-29 DIAGNOSIS — M542 Cervicalgia: Secondary | ICD-10-CM

## 2022-02-03 ENCOUNTER — Encounter: Payer: Self-pay | Admitting: Physical Therapy

## 2022-02-03 ENCOUNTER — Ambulatory Visit (INDEPENDENT_AMBULATORY_CARE_PROVIDER_SITE_OTHER): Payer: Medicare Other | Admitting: Physical Therapy

## 2022-02-03 DIAGNOSIS — M79602 Pain in left arm: Secondary | ICD-10-CM

## 2022-02-03 DIAGNOSIS — M542 Cervicalgia: Secondary | ICD-10-CM | POA: Diagnosis not present

## 2022-02-03 NOTE — Therapy (Signed)
?OUTPATIENT PHYSICAL THERAPY TREATMENT NOTE ? ? ?Patient Name: Michael [name] ?MRN: [id number] ?DOB:1956-06-25, 66 y.o., male ?Today's Date: 02/03/2022 ? ? ?END OF SESSION:  ? PT End of Session - 02/03/22 1428   ? ? Visit Number 2   ? Number of Visits 12   ? Date for PT Re-Evaluation 03/12/22   ? PT Start Time 1434   ? PT Stop Time 1512   ? PT Time Calculation (min) 38 min   ? ?  ?  ? ?  ? ? ?Past Medical History:  ?Diagnosis Date  ? Acute lateral meniscal tear, right, initial encounter 09/26/2021  ? Injection given September 26, 2018.  ? CAD S/P PCI to Cx & RCA   ? a. s/p PCI of LCx (2006) and ostium of RCA (2007) as well as DES to RCA in [place] (05/2014)  ? HTN (hypertension)   ? Hyperlipidemia   ? Plantar fasciitis 10/12/2017  ? ?Past Surgical History:  ?Procedure Laterality Date  ? CARDIAC CATHETERIZATION N/A 05/06/2016  ? Procedure: Left Heart Cath and Coronary Angiography;  Surgeon: Nelva Bush, MD;  Location: [facility];  Service: Cardiovascular;  Laterality: N/A;  ? CARDIAC CATHETERIZATION N/A 05/06/2016  ? Procedure: Coronary Stent Intervention;  Surgeon: Nelva Bush, MD;  Location: [facility];  Service: Cardiovascular;  Laterality: N/A;  ? CARDIAC CATHETERIZATION N/A 05/13/2016  ? Procedure: Coronary/Graft Angiography;  Surgeon: Peter M Martinique, MD;  Location: [facility];  Service: Cardiovascular;  Laterality: N/A;  ? CORONARY ANGIOPLASTY WITH STENT PLACEMENT  2006  ? in [place], occluded CFX, s/p Taxus stent  ? CORONARY ANGIOPLASTY WITH STENT PLACEMENT  2007  ? in [place], ostial RCA stent  ? CORONARY ANGIOPLASTY WITH STENT PLACEMENT  2015  ? in [place], RCA stent  ? TONSILLECTOMY AND ADENOIDECTOMY     ? ?Patient Active Problem List  ? Diagnosis Date Noted  ? Degenerative arthritis of right knee 11/15/2021  ? Cervical disc disorder with radiculopathy of cervical region 03/15/2021  ? Left shoulder pain 01/24/2021  ? AC (acromioclavicular) arthritis 01/24/2021  ?  Abnormal chest x-ray 01/24/2021  ? Right carpal tunnel syndrome 09/05/2020  ? Perianal irritation 06/08/2020  ? Ischemic cardiomyopathy 12/05/2019  ? History of adenomatous polyp of colon 06/07/2018  ? BPH (benign prostatic hyperplasia) 06/07/2018  ? Vitiligo 06/07/2018  ? Gluteal pain 04/10/2018  ? Back pain 04/13/2017  ? Nonallopathic lesion of thoracic region 04/13/2017  ? Nonallopathic lesion of sacral region 04/13/2017  ? Nonallopathic lesion of lumbosacral region 04/13/2017  ? Impaired glucose tolerance 11/03/2016  ? Obesity   ? HTN (hypertension)   ? CAD S/P multiple PCIs 09/08/2014  ? Dyslipidemia 09/08/2014  ? ?  ?PCP: Garret Reddish, MD ?  ?REFERRING PROVIDER: Gregor Hams, MD ?  ?REFERRING DIAG: M50.10 (ICD-10-CM) - Cervical disc disorder with radiculopathy of cervical region ?  ?THERAPY DIAG:  ?Cervicalgia ?  ?Pain in left arm ?  ?ONSET DATE: Since April 2023 ?  ?SUBJECTIVE:                                                                                                                                                                                                        ?  ?  SUBJECTIVE STATEMENT: ?02/03/2022 ?States that he felt a little better this weekend and he did some yard work. States he was thinking of returning to the pool this week.  ? ?Eval: States that he has been going to the gym at [place] and the resistance training was bothering his shoulders and then he started swimming. States that his pain started in his shoulder and then he started having burning in his hans down the center and hypothenar eminence. This comes  and goes. States he was concerned it was cardiac issues but he was cleared with that. States that hurts on the side of the elbow. Reports he also has tenderness along the side of his chest wall on the left. States that Dr. Tamala Julian had him on [facility] and he recently went off it and realized that he started having more pains since then.  ?  ?  ?PERTINENT HISTORY:  ?History of cardiac  issues x5 stents, low back pain and neck pain, torn right meniscus getting gel shots ?  ?PAIN:  ?Are you having pain? no: NPRS scale: 0/10 ?Pain location: left shoulder lateral  ?Pain description: chirping, sore ?Aggravating factors: ends of the day/ after doing a lot of things ?Relieving factors: rest ?  ?PRECAUTIONS: None ?  ?WEIGHT BEARING RESTRICTIONS No ?  ?FALLS:  ?Has patient fallen in last 6 months? No ?  ?OCCUPATION: retired, enjoys walking and swimming  ?  ?PLOF: Independent ?  ?PATIENT GOALS to make it go away so he can go swimming  ?  ?OBJECTIVE:  ?  ?DIAGNOSTIC FINDINGS:  ?Cervical Xray: 01/22/22 ?IMPRESSION: ?1. Diffuse multilevel degenerative change again noted. 2 mm ?anterolisthesis C4 on C5, most likely degenerative. Multifocal ?bilateral neural foraminal narrowing again noted. Similar findings ?noted on prior exam. No evidence of fracture. ?  ?2.  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 ?  ?  ?  ?COGNITION: ?Overall cognitive status: Within functional limits for tasks assessed ?  ?  ?SENSATION: ?WFL ?  ?POSTURE:  ?Sacral sitting, forward head, rounded shoulders ?  ?PALPATION: ?Increased resting tone on left shoulder girdle with tenderness along UT, deltoid, pecs on right    ?  ?CERVICAL ROM:  ?  ?Active ROM A/PROM (deg) ?01/29/2022  ?Flexion WFL  ?Extension WFL   ?Right lateral flexion    ?Left lateral flexion    ?Right rotation 60 - tight  ?Left rotation 50 - tight  ? (Blank rows = not tested) ?  ?  ?          UE Measurements ?      ?Upper Extremity Right ?01/29/2022 Left ?01/29/2022  ?  A/PROM MMT A/PROM MMT  ?Shoulder Flexion 170   170    ?Shoulder Extension [facility]   WFL *    ?Shoulder Abduction          ?Shoulder Adduction          ?Shoulder Internal Rotation T L5 SP - tight   T12 SP     ?Shoulder External Rotation T2 SP   Top of right shoulder - tight*    ?Elbow Flexion          ?Elbow Extension          ?Wrist Flexion          ?Wrist Extension          ?Wrist Supination          ?Wrist Pronation           ?Wrist Ulnar Deviation          ?  Wrist Radial Deviation          ?Grip Strength NA   NA    ?                     (Blank rows = not tested) ?                     * pain ?Right hand dominant ?  ?CERVICAL SPECIAL TESTS:  ?Spurling's negative ?Distraction positive for relief in supine ?Ulnar tension, median and radial nerve tests on Left ?  ?  ?  ?TODAY'S TREATMENT:  ?02/03/2022 ?Therapeutic Exercise: ?           Aerobic: ?Supine: s/l book stretch 2x5 bilateral ?Prone: shoulder retraction 3x5 5" holds, swimmers 3x5 5" holds ?           Seated: ?           Standing: median nerve glide x10,  pec stretch unilateral x3 15" holds bilateral, shoulder exten at wall with good posture x10 5" holds, SHOULDER flexion up wall 2x5 bilateral  ?Neuromuscular Re-education: ?Manual Therapy: ?Therapeutic Activity: ?Self Care: ?Trigger Point Dry Needling:  ?Modalities:  ?  ?  ?  ?  ?PATIENT EDUCATION:  ?Education details: on HEP, on reducing amount of laps in pool and easing back into precious exercises, on anatomy and lat dorsi muscle ?Person educated: Patient ?Education method: Explanation, Demonstration, and Handouts ?Education comprehension: verbalized understanding ?  ?  ?HOME EXERCISE PROGRAM: ?[id number] ?  ?ASSESSMENT: ?  ?CLINICAL IMPRESSION: ?02/03/2022 ?Progressed exercises today which were tolerated well. Educated patient in rationale for exercises and importance of adherence to continue to be mobile and be able to perform tasks he would like to do. Educated patient on anatomy and rationale for exercises. Will continue with current POC as tolerated.  ? ?Eval: Patient is a 66 y.o. male who was seen today for physical therapy evaluation and treatment for left cervical radiculopathy. Patient with positive neural tension and postural restrictions that are contributing to current presentation. Answered all questions added exercises to HEP. Patient would greatly benefit from skilled PT to  improve overall function and return him to  optimal function. ?  ?  ?OBJECTIVE IMPAIRMENTS decreased activity tolerance, decreased ROM, decreased strength, postural dysfunction, and pain.  ?  ?ACTIVITY LIMITATIONS community activity and working out .

## 2022-02-05 ENCOUNTER — Encounter: Payer: Self-pay | Admitting: Physical Therapy

## 2022-02-05 ENCOUNTER — Ambulatory Visit (INDEPENDENT_AMBULATORY_CARE_PROVIDER_SITE_OTHER): Payer: Medicare Other | Admitting: Physical Therapy

## 2022-02-05 DIAGNOSIS — M542 Cervicalgia: Secondary | ICD-10-CM

## 2022-02-05 DIAGNOSIS — M79602 Pain in left arm: Secondary | ICD-10-CM

## 2022-02-05 NOTE — Therapy (Signed)
?OUTPATIENT PHYSICAL THERAPY TREATMENT NOTE ? ? ?Patient Name: Michael [name] ?MRN: [id number] ?DOB:1955/10/23, 66 y.o., male ?Today's Date: 02/05/2022 ? ? ?END OF SESSION:  ? PT End of Session - 02/05/22 0801   ? ? Visit Number 3   ? Number of Visits 12   ? Date for PT Re-Evaluation 03/12/22   ? PT Start Time 0802   ? PT Stop Time 0840   ? PT Time Calculation (min) 38 min   ? ?  ?  ? ?  ? ? ?Past Medical History:  ?Diagnosis Date  ? Acute lateral meniscal tear, right, initial encounter 09/26/2021  ? Injection given September 26, 2018.  ? CAD S/P PCI to Cx & RCA   ? a. s/p PCI of LCx (2006) and ostium of RCA (2007) as well as DES to RCA in [place] (05/2014)  ? HTN (hypertension)   ? Hyperlipidemia   ? Plantar fasciitis 10/12/2017  ? ?Past Surgical History:  ?Procedure Laterality Date  ? CARDIAC CATHETERIZATION N/A 05/06/2016  ? Procedure: Left Heart Cath and Coronary Angiography;  Surgeon: Nelva Bush, MD;  Location: [facility];  Service: Cardiovascular;  Laterality: N/A;  ? CARDIAC CATHETERIZATION N/A 05/06/2016  ? Procedure: Coronary Stent Intervention;  Surgeon: Nelva Bush, MD;  Location: [facility];  Service: Cardiovascular;  Laterality: N/A;  ? CARDIAC CATHETERIZATION N/A 05/13/2016  ? Procedure: Coronary/Graft Angiography;  Surgeon: Peter M Martinique, MD;  Location: [facility];  Service: Cardiovascular;  Laterality: N/A;  ? CORONARY ANGIOPLASTY WITH STENT PLACEMENT  2006  ? in [place], occluded CFX, s/p Taxus stent  ? CORONARY ANGIOPLASTY WITH STENT PLACEMENT  2007  ? in [place], ostial RCA stent  ? CORONARY ANGIOPLASTY WITH STENT PLACEMENT  2015  ? in [place], RCA stent  ? TONSILLECTOMY AND ADENOIDECTOMY     ? ?Patient Active Problem List  ? Diagnosis Date Noted  ? Degenerative arthritis of right knee 11/15/2021  ? Cervical disc disorder with radiculopathy of cervical region 03/15/2021  ? Left shoulder pain 01/24/2021  ? AC (acromioclavicular) arthritis 01/24/2021  ?  Abnormal chest x-ray 01/24/2021  ? Right carpal tunnel syndrome 09/05/2020  ? Perianal irritation 06/08/2020  ? Ischemic cardiomyopathy 12/05/2019  ? History of adenomatous polyp of colon 06/07/2018  ? BPH (benign prostatic hyperplasia) 06/07/2018  ? Vitiligo 06/07/2018  ? Gluteal pain 04/10/2018  ? Back pain 04/13/2017  ? Nonallopathic lesion of thoracic region 04/13/2017  ? Nonallopathic lesion of sacral region 04/13/2017  ? Nonallopathic lesion of lumbosacral region 04/13/2017  ? Impaired glucose tolerance 11/03/2016  ? Obesity   ? HTN (hypertension)   ? CAD S/P multiple PCIs 09/08/2014  ? Dyslipidemia 09/08/2014  ? ?  ?PCP: Garret Reddish, MD ?  ?REFERRING PROVIDER: Gregor Hams, MD ?  ?REFERRING DIAG: M50.10 (ICD-10-CM) - Cervical disc disorder with radiculopathy of cervical region ?  ?THERAPY DIAG:  ?Cervicalgia ?  ?Pain in left arm ?  ?ONSET DATE: Since April 2023 ?  ?SUBJECTIVE:                                                                                                                                                                                                        ?  ?  SUBJECTIVE STATEMENT: ?02/05/2022 ?States he tried swimming but he took it easy. States he had some discomfort in his shoulder and elbows. States that he didn't have the same pain. Reports no pain or difficulties with exercises since last session. ? ?Eval: States that he has been going to the gym at [place] and the resistance training was bothering his shoulders and then he started swimming. States that his pain started in his shoulder and then he started having burning in his hands down the center and hypothenar eminence. This comes  and goes. States he was concerned it was cardiac issues but he was cleared with that. States that hurts on the side of the elbow. Reports he also has tenderness along the side of his chest wall on the left. States that Dr. Tamala Julian had him on [facility] and he recently went off it and realized that he started  having more pains since then.  ?  ?  ?PERTINENT HISTORY:  ?History of cardiac issues x5 stents, low back pain and neck pain, torn right meniscus getting gel shots ?  ?PAIN:  ?Are you having pain? no: NPRS scale: 0/10 ?Pain location: left shoulder lateral  ?Pain description: chirping, sore ?Aggravating factors: ends of the day/ after doing a lot of things ?Relieving factors: rest ?  ?PRECAUTIONS: None ?  ?WEIGHT BEARING RESTRICTIONS No ?  ?FALLS:  ?Has patient fallen in last 6 months? No ?  ?OCCUPATION: retired, enjoys walking and swimming  ?  ?PLOF: Independent ?  ?PATIENT GOALS to make it go away so he can go swimming  ?  ?OBJECTIVE:  ?  ?DIAGNOSTIC FINDINGS:  ?Cervical Xray: 01/22/22 ?IMPRESSION: ?1. Diffuse multilevel degenerative change again noted. 2 mm ?anterolisthesis C4 on C5, most likely degenerative. Multifocal ?bilateral neural foraminal narrowing again noted. Similar findings ?noted on prior exam. No evidence of fracture. ?  ?2.  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 ?  ?  ?  ?COGNITION: ?Overall cognitive status: Within functional limits for tasks assessed ?  ?  ?SENSATION: ?WFL ?  ?POSTURE:  ?Sacral sitting, forward head, rounded shoulders ?  ?PALPATION: ?Increased resting tone on left shoulder girdle with tenderness along UT, deltoid, pecs on right    ?  ?CERVICAL ROM:  ?  ?Active ROM A/PROM (deg) ?01/29/2022  ?Flexion WFL  ?Extension WFL   ?Right lateral flexion    ?Left lateral flexion    ?Right rotation 60 - tight  ?Left rotation 50 - tight  ? (Blank rows = not tested) ?  ?  ?          UE Measurements ?      ?Upper Extremity Right ?01/29/2022 Left ?01/29/2022  ?  A/PROM MMT A/PROM MMT  ?Shoulder Flexion 170   170    ?Shoulder Extension [facility]   WFL *    ?Shoulder Abduction          ?Shoulder Adduction          ?Shoulder Internal Rotation T L5 SP - tight   T12 SP     ?Shoulder External Rotation T2 SP   Top of right shoulder - tight*    ?Elbow Flexion          ?Elbow Extension          ?Wrist Flexion           ?Wrist Extension          ?Wrist Supination          ?Wrist Pronation          ?  Wrist Ulnar Deviation          ?Wrist Radial Deviation          ?Grip Strength NA   NA    ?                     (Blank rows = not tested) ?                     * pain ?Right hand dominant ?  ?CERVICAL SPECIAL TESTS:  ?Spurling's negative ?Distraction positive for relief in supine ?Ulnar tension, median and radial nerve tests on Left ?  ?  ?  ?TODAY'S TREATMENT:  ?02/05/2022 ?Therapeutic Exercise: ?           Aerobic: ?           Seated: ?           Standing:shoulder ER at wall 2x10 5" holds, shoulder flexion with dowel at wall with posterior tilt x10, Scare crow at wall x25 bilateral counter push up with + 5x5, modified child's pose x5 L/R /C at counter, shoulder extension with dowel x15 5" holds ?Neuromuscular Re-education: overhead lat pull down green band PT assist with  ? ?Previous Interventions: ?Supine: s/l book stretch 2x5 bilateral ?Prone: shoulder retraction 3x5 5" holds, swimmers 3x5 5" holds  ? median nerve glide x10,  pec stretch unilateral x3 15" holds bilateral, shoulder exten at wall with good posture x10 5" holds, SHOULDER flexion up wall 2x5 bilateral  ?Neuromuscular Re-education: overhead lat pull down green band PT assist with tactile cues 3x10 bilateral ?Manual Therapy: ?Therapeutic Activity: ?Self Care: ?Trigger Point Dry Needling:  ?Modalities:  ?  ?  ?  ?  ?PATIENT EDUCATION:  ?Education details: on HEP ?Person educated: Patient ?Education method: Explanation, Demonstration, and Handouts ?Education comprehension: verbalized understanding ?  ?  ?HOME EXERCISE PROGRAM: ?[id number] ?  ?ASSESSMENT: ?  ?CLINICAL IMPRESSION: ?02/05/2022 ?Continue to progress mobility and strength today which was tolerated well. Mild soreness in shoulders but this residual from swimming yesterday. Added new exercises to HEP. Mild soreness end of session as if he felt a work out. Will continue with current POC.  ? ?Eval: Patient is a 66 y.o.  male who was seen today for physical therapy evaluation and treatment for left cervical radiculopathy. Patient with positive neural tension and postural restrictions that are contributing to current presen

## 2022-02-06 ENCOUNTER — Encounter: Payer: Self-pay | Admitting: Family Medicine

## 2022-02-06 ENCOUNTER — Ambulatory Visit (INDEPENDENT_AMBULATORY_CARE_PROVIDER_SITE_OTHER): Payer: Medicare Other | Admitting: Family Medicine

## 2022-02-06 VITALS — BP 102/60 | HR 76 | Temp 97.3°F | Ht 66.0 in | Wt 195.2 lb

## 2022-02-06 DIAGNOSIS — I255 Ischemic cardiomyopathy: Secondary | ICD-10-CM | POA: Diagnosis not present

## 2022-02-06 DIAGNOSIS — E785 Hyperlipidemia, unspecified: Secondary | ICD-10-CM | POA: Diagnosis not present

## 2022-02-06 DIAGNOSIS — I10 Essential (primary) hypertension: Secondary | ICD-10-CM | POA: Diagnosis not present

## 2022-02-06 DIAGNOSIS — R739 Hyperglycemia, unspecified: Secondary | ICD-10-CM | POA: Diagnosis not present

## 2022-02-06 DIAGNOSIS — I25118 Atherosclerotic heart disease of native coronary artery with other forms of angina pectoris: Secondary | ICD-10-CM

## 2022-02-06 LAB — COMPREHENSIVE METABOLIC PANEL
ALT: 30 U/L (ref 0–53)
AST: 27 U/L (ref 0–37)
Albumin: 4.1 g/dL (ref 3.5–5.2)
Alkaline Phosphatase: 74 U/L (ref 39–117)
BUN: 22 mg/dL (ref 6–23)
CO2: 30 mEq/L (ref 19–32)
Calcium: 9.5 mg/dL (ref 8.4–10.5)
Chloride: 103 mEq/L (ref 96–112)
Creatinine, Ser: 0.98 mg/dL (ref 0.40–1.50)
GFR: 80.13 mL/min (ref >60.00)
Glucose, Bld: 125 mg/dL — ABNORMAL HIGH (ref 70–99)
Potassium: 3.6 mEq/L (ref 3.5–5.1)
Sodium: 140 mEq/L (ref 135–145)
Total Bilirubin: 1.4 mg/dL — ABNORMAL HIGH (ref 0.2–1.2)
Total Protein: 6.7 g/dL (ref 6.0–8.3)

## 2022-02-06 LAB — HEMOGLOBIN A1C: Hgb A1c MFr Bld: 5.9 % (ref 4.6–6.5)

## 2022-02-06 NOTE — Progress Notes (Signed)
Phone [phone] In person visit   Subjective:   Michael [name] is a 66 y.o. year old very pleasant male patient who presents for/with See problem oriented charting Chief Complaint  Patient presents with   Follow-up    Past Medical History-  Patient Active Problem List   Diagnosis Date Noted   Ischemic cardiomyopathy 12/05/2019    Priority: High   CAD S/P multiple PCIs 09/08/2014    Priority: High   History of adenomatous polyp of colon 06/07/2018    Priority: Medium    BPH (benign prostatic hyperplasia) 06/07/2018    Priority: Medium    Impaired glucose tolerance 11/03/2016    Priority: Medium    HTN (hypertension)     Priority: Medium    Dyslipidemia 09/08/2014    Priority: Medium    Left shoulder pain 01/24/2021    Priority: Low   AC (acromioclavicular) arthritis 01/24/2021    Priority: Low   Vitiligo 06/07/2018    Priority: Low   Gluteal pain 04/10/2018    Priority: Low   Back pain 04/13/2017    Priority: Low   Nonallopathic lesion of thoracic region 04/13/2017    Priority: Low   Nonallopathic lesion of sacral region 04/13/2017    Priority: Low   Nonallopathic lesion of lumbosacral region 04/13/2017    Priority: Low   Obesity     Priority: Low   Coronary artery disease with stable angina pectoris, unspecified vessel or lesion type, unspecified whether native or transplanted heart ([place]) 02/06/2022   Degenerative arthritis of right knee 11/15/2021   Cervical disc disorder with radiculopathy of cervical region 03/15/2021   Abnormal chest x-ray 01/24/2021   Right carpal tunnel syndrome 09/05/2020   Perianal irritation 06/08/2020    Medications- reviewed and updated Current Outpatient Medications  Medication Sig Dispense Refill   aspirin 81 MG tablet Take 81 mg by mouth daily.     atorvastatin (LIPITOR) 80 MG tablet TAKE 1 TABLET DAILY 90 tablet 3   BRILINTA 90 MG TABS tablet TAKE 1 TABLET TWICE A DAY 180 tablet 3   CHERRY PO Take 50 mg by mouth  daily. Cherry extract     Cinnamon 500 MG capsule Take 500 mg by mouth daily.     EPINEPHrine 0.3 mg/0.3 mL IJ SOAJ injection See admin instructions.     hydrocortisone (ANUSOL-HC) 2.5 % rectal cream Place 1 application rectally 2 (two) times daily. 30 g 1   isosorbide mononitrate (IMDUR) 60 MG 24 hr tablet TAKE 1 TABLET DAILY 90 tablet 3   nitroGLYCERIN (NITROSTAT) 0.4 MG SL tablet DISSOLVE 1 TABLET UNDER THE TONGUE EVERY 5 MINUTES AS NEEDED FOR CHEST PAIN UP TO 3 DOSES 25 tablet 1   Turmeric 500 MG CAPS Take 500 mg by mouth daily.     Vitamin D, Ergocalciferol, (DRISDOL) 1.25 MG (50000 UNIT) CAPS capsule TAKE 1 CAPSULE EVERY 7 DAYS 12 capsule 3   No current facility-administered medications for this visit.     Objective:  BP 102/60   Pulse 76   Temp (!) 97.3 F (36.3 C)   Ht _0  (1.676 m)   Wt 195 lb 3.2 oz (88.5 kg)   SpO2 99%   BMI 31.51 kg/m  Gen: NAD, resting comfortably CV: RRR no murmurs rubs or gallops Lungs: CTAB no crackles, wheeze, rhonchi Ext: no edema Skin: warm, dry    Assessment and Plan   #Cervicalgia-working with physical therapy referred by Dr. Georgina Snell who he saw on 01/21/2022 after being seen in  the emergency room on 01/15/2022 for left arm pain-had also previously seen Dr. Tamala Julian- has had various issues that have come up with different attempts to exercise - he stopped DHEA in march and feels like has had more issues since then. We will trend PSA -plans to follow up with Dr. Tamala Julian  # Rash/itching on hands and arms- was given triamcinolone by Jeanie Sewer, NP- states only helped some. Started end of march after stopping DHEA - had seen Dr. Denna Haggard on  for follow up and had intertrigo- states triple paste  antifungal cream wasn't helpful- may have worsened - from avs "Consider follow up with Dr. Denna Haggard- ask his opinion about allergist if he does not see a clear cause  Consider basic non scented soaps.   Could try claritin or allegra before bed" -has  already had recent CBC  #CAD with angina controlled on Imdur  #hyperlipidemia-LDL goal under 70 S: Medication:Atorvastatin 80 mg, aspirin 81 mg, Brilinta 90 mg, Imdur 60 mg, nitroglycerin on hand but has not had to use regularly- except for ER trip recently  Current symptoms:Denies chest pain shortness of breath -Last seen by Dr. Percival Spanish on 12/09/2021 Lab Results  Component Value Date   CHOL 117 08/07/2021   HDL 49.80 08/07/2021   [place] 52 08/07/2021   LDLDIRECT 62.0 09/14/2019   TRIG 77.0 08/07/2021   CHOLHDL 2 08/07/2021    A/P: For CAD largely angina free on current regimen-continue current medication For hyperlipidemia-Control last check with LDL under 70-continue current medicines  #hypertension S: medication: Imdur 60 mg. Does not feel lightheaded/dizzy Home readings #s: does not check.   BP Readings from Last 3 Encounters:  02/06/22 102/60  01/21/22 (!) 142/84  01/20/22 94/61   A/P:  Controlled. Continue current medications.  May need to reduce imdur if becomes orthostatic    # Hyperglycemia/insulin resistance/prediabetes-peak A1c 6.0 S:  Medication: None other than prefers to take cinnamon Lab Results  Component Value Date   HGBA1C 6.2 08/07/2021   HGBA1C 6.3 02/05/2021   HGBA1C 6.0 (H) 08/03/2020  A/P:  hopefully stable or improved- update A1c today. Continue without meds for now  other than cinnamon   # B12 deficiency S: Current treatment/medication (oral vs. IM): Patient with low under 200 November 2021-treated with 4 weeks of B12 injections.  At present taking MV with b12  A/P: suspect stable- recheck next visit   Recommended follow up: Return in about 6 months (around 08/09/2022) for followup or sooner if needed.Schedule b4 you leave. Future Appointments  Date Time Provider [place]  02/10/2022  2:30 PM Therisa Doyne, PT [facility]  02/12/2022  8:00 AM Therisa Doyne, PT [facility]  02/18/2022  9:30 AM Therisa Doyne, PT  [facility]  02/20/2022  9:30 AM Therisa Doyne, PT [facility]  02/24/2022  8:00 AM Therisa Doyne, PT [facility]  03/03/2022  8:00 AM Therisa Doyne, PT [facility]  03/04/2022  7:45 AM [name], DO [facility] None  03/05/2022  8:00 AM Therisa Doyne, PT [facility]  03/21/2022  8:00 AM Gregor Hams, MD [facility] None  10/07/2022  8:45 AM [facility] HEALTH COACH [facility]  12/02/2022  7:30 AM Lavonna Monarch, MD [facility]    Lab/Order associations:   ICD-10-CM   1. Primary hypertension  I10 Comp Met (CMET)    2. Hyperglycemia  R73.9 Hemoglobin A1c    3. Dyslipidemia  E78.5     4. Coronary artery disease  with stable angina pectoris, unspecified vessel or lesion type, unspecified whether native or transplanted heart ([place])  I25.118       No orders of the defined types were placed in this encounter.   Return precautions advised.  Garret Reddish, MD

## 2022-02-06 NOTE — Patient Instructions (Addendum)
Health Maintenance Due  Topic Date Due   Zoster Vaccines- Shingrix (1 of 2)- consider at pharmacy and send usdates Never done   Pneumonia Vaccine 54+ Years old (2 - PCV)- will plan on next visit 08/03/2021   Consider follow up with Dr. Denna Haggard- ask his opinion about allergist if he does not see a clear cause  Consider basic non scented soaps.   Could try claritin or allegra before bed  Please stop by lab before you go If you have mychart- we will send your results within 3 business days of [place] receiving them.  If you do not have mychart- we will call you about results within 5 business days of [place] receiving them.  *please also note that you will see labs on mychart as soon as they post. I will later go in and write notes on them- will say "notes from Dr. Yong Channel"   Recommended follow up: Return in about 6 months (around 08/09/2022) for followup or sooner if needed.Schedule b4 you leave.

## 2022-02-10 ENCOUNTER — Encounter: Payer: Self-pay | Admitting: Physical Therapy

## 2022-02-10 ENCOUNTER — Ambulatory Visit (INDEPENDENT_AMBULATORY_CARE_PROVIDER_SITE_OTHER): Payer: Medicare Other | Admitting: Physical Therapy

## 2022-02-10 DIAGNOSIS — M79602 Pain in left arm: Secondary | ICD-10-CM

## 2022-02-10 DIAGNOSIS — M542 Cervicalgia: Secondary | ICD-10-CM

## 2022-02-10 NOTE — Therapy (Signed)
OUTPATIENT PHYSICAL THERAPY TREATMENT NOTE   Patient Name: Michael [name] MRN: [id number] DOB:05/10/1956, 66 y.o., male Today's Date: 02/10/2022   END OF SESSION:   PT End of Session - 02/10/22 1424     Visit Number 4    Number of Visits 12    Date for PT Re-Evaluation 03/12/22    PT Start Time 1430    PT Stop Time 1510    PT Time Calculation (min) 40 min             Past Medical History:  Diagnosis Date   Acute lateral meniscal tear, right, initial encounter 09/26/2021   Injection given September 26, 2018.   CAD S/P PCI to Cx & RCA    a. s/p PCI of LCx (2006) and ostium of RCA (2007) as well as DES to RCA in [place] (05/2014)   HTN (hypertension)    Hyperlipidemia    Plantar fasciitis 10/12/2017   Past Surgical History:  Procedure Laterality Date   CARDIAC CATHETERIZATION N/A 05/06/2016   Procedure: Left Heart Cath and Coronary Angiography;  Surgeon: Nelva Bush, MD;  Location: [facility];  Service: Cardiovascular;  Laterality: N/A;   CARDIAC CATHETERIZATION N/A 05/06/2016   Procedure: Coronary Stent Intervention;  Surgeon: Nelva Bush, MD;  Location: [facility];  Service: Cardiovascular;  Laterality: N/A;   CARDIAC CATHETERIZATION N/A 05/13/2016   Procedure: Coronary/Graft Angiography;  Surgeon: Peter M Martinique, MD;  Location: [facility];  Service: Cardiovascular;  Laterality: N/A;   CORONARY ANGIOPLASTY WITH STENT PLACEMENT  2006   in [place], occluded CFX, s/p Taxus stent   CORONARY ANGIOPLASTY WITH STENT PLACEMENT  2007   in [place], ostial RCA stent   CORONARY ANGIOPLASTY WITH STENT PLACEMENT  2015   in [place], [place] stent   [place]      Patient Active Problem List   Diagnosis Date Noted   Coronary artery disease with stable angina pectoris, unspecified vessel or lesion type, unspecified whether native or transplanted heart ([place]) 02/06/2022   Degenerative arthritis of right knee 11/15/2021    Cervical disc disorder with radiculopathy of cervical region 03/15/2021   Left shoulder pain 01/24/2021   AC (acromioclavicular) arthritis 01/24/2021   Abnormal chest x-ray 01/24/2021   Right carpal tunnel syndrome 09/05/2020   Perianal irritation 06/08/2020   Ischemic cardiomyopathy 12/05/2019   History of adenomatous polyp of colon 06/07/2018   BPH (benign prostatic hyperplasia) 06/07/2018   Vitiligo 06/07/2018   Gluteal pain 04/10/2018   Back pain 04/13/2017   Nonallopathic lesion of thoracic region 04/13/2017   Nonallopathic lesion of sacral region 04/13/2017   Nonallopathic lesion of lumbosacral region 04/13/2017   Impaired glucose tolerance 11/03/2016   Obesity    HTN (hypertension)    CAD S/P multiple PCIs 09/08/2014   Dyslipidemia 09/08/2014     PCP: Garret Reddish, MD   REFERRING PROVIDER: Gregor Hams, MD   REFERRING DIAG: M50.10 (ICD-10-CM) - Cervical disc disorder with radiculopathy of cervical region   THERAPY DIAG:  Cervicalgia   Pain in left arm   ONSET DATE: Since April 2023   SUBJECTIVE:  SUBJECTIVE STATEMENT: 02/10/2022 Feels good today and yesterday. Went swimming on Friday with no difficulties. No current pain. States he had a slight flare up in right back pain after last session. States that he also did some pressure washing  too that may have flared it up. States that overall feels about 50-60% better since start of PT.  Eval: States that he has been going to the gym at [place] and the resistance training was bothering his shoulders and then he started swimming. States that his pain started in his shoulder and then he started having burning in his hands down the center and hypothenar eminence. This comes  and goes. States he was concerned it was cardiac  issues but he was cleared with that. States that hurts on the side of the elbow. Reports he also has tenderness along the side of his chest wall on the left. States that Dr. Tamala Julian had him on [facility] and he recently went off it and realized that he started having more pains since then.      PERTINENT HISTORY:  History of cardiac issues x5 stents, low back pain and neck pain, torn right meniscus getting gel shots   PAIN:  Are you having pain? no: NPRS scale: 0/10 Pain location: left shoulder lateral  Pain description: chirping, sore Aggravating factors: ends of the day/ after doing a lot of things Relieving factors: rest   PRECAUTIONS: None   WEIGHT BEARING RESTRICTIONS No   FALLS:  Has patient fallen in last 6 months? No   OCCUPATION: retired, enjoys walking and swimming    PLOF: Independent   PATIENT GOALS to make it go away so he can go swimming    OBJECTIVE:    DIAGNOSTIC FINDINGS:  Cervical Xray: 01/22/22 IMPRESSION: 1. Diffuse multilevel degenerative change again noted. 2 mm anterolisthesis C4 on C5, most likely degenerative. Multifocal bilateral neural foraminal narrowing again noted. Similar findings noted on prior exam. No evidence of fracture.   2.  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       COGNITION: Overall cognitive status: Within functional limits for tasks assessed     SENSATION: WFL   POSTURE:  Sacral sitting, forward head, rounded shoulders   PALPATION: Increased resting tone on left shoulder girdle with tenderness along UT, deltoid, pecs on right      CERVICAL ROM:    Active ROM A/PROM (deg) 01/29/2022  Flexion WFL  Extension WFL   Right lateral flexion    Left lateral flexion    Right rotation 60 - tight  Left rotation 50 - tight   (Blank rows = not tested)               UE Measurements       Upper Extremity Right 01/29/2022 Left 01/29/2022    A/PROM MMT A/PROM MMT  Shoulder Flexion 170   170    Shoulder Extension [facility]   WFL *     Shoulder Abduction          Shoulder Adduction          Shoulder Internal Rotation T L5 SP - tight   T12 SP     Shoulder External Rotation T2 SP   Top of right shoulder - tight*    Elbow Flexion          Elbow Extension          Wrist Flexion          Wrist Extension  Wrist Supination          Wrist Pronation          Wrist Ulnar Deviation          Wrist Radial Deviation          Grip Strength NA   NA                         (Blank rows = not tested)                      * pain Right hand dominant   CERVICAL SPECIAL TESTS:  Spurling's negative Distraction positive for relief in supine Ulnar tension, median and radial nerve tests on Left       TODAY'S TREATMENT:  02/10/2022 Therapeutic Exercise:            Aerobic:            Seated:            Standing:shoulder ER/IR at wall (scarecrow) 2x12 5" holds, shoulder flexion with dowel at wall with posterior tilt 2 minutes total, high row GTB 2x15 Bilateral, modified child's pose 2x5 5" holds straight only, counter push ups 4x5, D2 flexion GTB 3x10 B  S/L: left shoulder ER 3# 6" holds 3x10 total, shoulder flexion left 4x5      Previous Interventions: Supine: s/l book stretch 2x5 bilateral Prone: shoulder retraction 3x5 5" holds, swimmers 3x5 5" holds   median nerve glide x10,  pec stretch unilateral x3 15" holds bilateral, shoulder exten at wall with good posture x10 5" holds, SHOULDER flexion up wall 2x5 bilateral  Neuromuscular Re-education: overhead lat pull down green band PT assist with tactile cues 3x10 bilateral Manual Therapy: Therapeutic Activity: Self Care: Trigger Point Dry Needling:  Modalities:          PATIENT EDUCATION:  Education details: on HEP Person educated: Patient Education method: Explanation, Demonstration, and Handouts Education comprehension: verbalized understanding     HOME EXERCISE PROGRAM: [email or web address]   ASSESSMENT:   CLINICAL IMPRESSION: 02/10/2022 Continued to focus on  mobility and strengthening as tolerated. Verbal cues for form throughout session. Fatigue in arms but no pain in back or arms noted. Reviewed exercises for HEP to reduce risk of low back pain onset. Added new exercises to HEP, will continue with current POC as tolerated  Eval: Patient is a 66 y.o. male who was seen today for physical therapy evaluation and treatment for left cervical radiculopathy. Patient with positive neural tension and postural restrictions that are contributing to current presentation. Answered all questions added exercises to HEP. Patient would greatly benefit from skilled PT to  improve overall function and return him to optimal function.     OBJECTIVE IMPAIRMENTS decreased activity tolerance, decreased ROM, decreased strength, postural dysfunction, and pain.    ACTIVITY LIMITATIONS community activity and working out .    PERSONAL FACTORS Age, Fitness, and 1-2 comorbidities: right knee pain, cardiac history  are also affecting patient's functional outcome.      REHAB POTENTIAL: Good   CLINICAL DECISION MAKING: Stable/uncomplicated   EVALUATION COMPLEXITY: Low     GOALS: Goals reviewed with patient?  yes   SHORT TERM GOALS:   Patient will be independent in self management strategies to improve quality of life and functional outcomes. Baseline: new program Target date: 02/19/2022 Goal status: INITIAL   2.  Patient will report at least 50% improvement in overall symptoms and/or function to  demonstrate improved functional mobility Baseline: 0% Target date: 02/19/2022 Goal status: INITIAL   3.  Patient will report radicular symptoms in left UE to be at least 50% less Baseline: 0% better Target date: 02/19/2022 Goal status: INITIAL         LONG TERM GOALS:   Patient will report at least 75% improvement in overall symptoms and/or function to demonstrate improved functional mobility Baseline: 0% Target date: 03/12/2022 Goal status: INITIAL   2.  Patient  will be able to swim without pain or difficulties to return to PLOF Baseline: painful Target date: 03/12/2022 Goal status: INITIAL   3.  Patient will be able to demonstrate painfree left shoulder ROM Baseline: painful Target date: 03/12/2022 Goal status: INITIAL         PLAN: PT FREQUENCY: 2x/week   PT DURATION: 6 weeks   PLANNED INTERVENTIONS: Therapeutic exercises, Therapeutic activity, Neuromuscular re-education, Balance training, Gait training, Patient/Family education, Joint mobilization, Canalith repositioning, Dry Needling, Electrical stimulation, Spinal manipulation, Spinal mobilization, Cryotherapy, Moist heat, Ionotophoresis '4mg'$ /ml Dexamethasone, and Manual therapy   PLAN FOR NEXT SESSION: assess MMT, STM/self STM, thoracic mobility, neck/shoulder motion and strengthening, lat strengthening and mobility   3:11 PM, 02/10/22 Jerene Pitch, DPT Physical Therapy with Royston Sinner

## 2022-02-12 ENCOUNTER — Ambulatory Visit (INDEPENDENT_AMBULATORY_CARE_PROVIDER_SITE_OTHER): Payer: Medicare Other | Admitting: Physical Therapy

## 2022-02-12 ENCOUNTER — Encounter: Payer: Self-pay | Admitting: Physical Therapy

## 2022-02-12 DIAGNOSIS — M79602 Pain in left arm: Secondary | ICD-10-CM | POA: Diagnosis not present

## 2022-02-12 DIAGNOSIS — M542 Cervicalgia: Secondary | ICD-10-CM

## 2022-02-12 NOTE — Therapy (Signed)
OUTPATIENT PHYSICAL THERAPY TREATMENT NOTE   Patient Name: Michael [name] MRN: [id number] DOB:12-23-1955, 66 y.o., male Today's Date: 02/12/2022   END OF SESSION:   PT End of Session - 02/12/22 0759     Visit Number 5    Number of Visits 12    Date for PT Re-Evaluation 03/12/22    PT Start Time 0800    PT Stop Time 0840    PT Time Calculation (min) 40 min             Past Medical History:  Diagnosis Date   Acute lateral meniscal tear, right, initial encounter 09/26/2021   Injection given September 26, 2018.   CAD S/P PCI to Cx & RCA    a. s/p PCI of LCx (2006) and ostium of RCA (2007) as well as DES to RCA in [place] (05/2014)   HTN (hypertension)    Hyperlipidemia    Plantar fasciitis 10/12/2017   Past Surgical History:  Procedure Laterality Date   CARDIAC CATHETERIZATION N/A 05/06/2016   Procedure: Left Heart Cath and Coronary Angiography;  Surgeon: Nelva Bush, MD;  Location: [place] [facility];  Service: Cardiovascular;  Laterality: N/A;   CARDIAC CATHETERIZATION N/A 05/06/2016   Procedure: Coronary Stent Intervention;  Surgeon: Nelva Bush, MD;  Location: [place] [facility];  Service: Cardiovascular;  Laterality: N/A;   CARDIAC CATHETERIZATION N/A 05/13/2016   Procedure: Coronary/Graft Angiography;  Surgeon: Peter M Martinique, MD;  Location: [place] [facility];  Service: Cardiovascular;  Laterality: N/A;   CORONARY ANGIOPLASTY WITH STENT PLACEMENT  2006   in [place], occluded CFX, s/p Taxus stent   CORONARY ANGIOPLASTY WITH STENT PLACEMENT  2007   in [place], ostial RCA stent   CORONARY ANGIOPLASTY WITH STENT PLACEMENT  2015   in [place], [place] stent   [place]      Patient Active Problem List   Diagnosis Date Noted   Coronary artery disease with stable angina pectoris, unspecified vessel or lesion type, unspecified whether native or transplanted heart ([place]) 02/06/2022   Degenerative arthritis of right knee 11/15/2021    Cervical disc disorder with radiculopathy of cervical region 03/15/2021   Left shoulder pain 01/24/2021   AC (acromioclavicular) arthritis 01/24/2021   Abnormal chest x-ray 01/24/2021   Right carpal tunnel syndrome 09/05/2020   Perianal irritation 06/08/2020   Ischemic cardiomyopathy 12/05/2019   History of adenomatous polyp of colon 06/07/2018   BPH (benign prostatic hyperplasia) 06/07/2018   Vitiligo 06/07/2018   Gluteal pain 04/10/2018   Back pain 04/13/2017   Nonallopathic lesion of thoracic region 04/13/2017   Nonallopathic lesion of sacral region 04/13/2017   Nonallopathic lesion of lumbosacral region 04/13/2017   Impaired glucose tolerance 11/03/2016   Obesity    HTN (hypertension)    CAD S/P multiple PCIs 09/08/2014   Dyslipidemia 09/08/2014     PCP: Garret Reddish, MD   REFERRING PROVIDER: Gregor Hams, MD   REFERRING DIAG: M50.10 (ICD-10-CM) - Cervical disc disorder with radiculopathy of cervical region   THERAPY DIAG:  Cervicalgia   Pain in left arm   ONSET DATE: Since April 2023   SUBJECTIVE:  SUBJECTIVE STATEMENT: 02/12/2022 States no pain but has some stiffness in his neck. States that he has a topical he uses that really helps  Eval: States that he has been going to the gym at [place] and the resistance training was bothering his shoulders and then he started swimming. States that his pain started in his shoulder and then he started having burning in his hands down the center and hypothenar eminence. This comes  and goes. States he was concerned it was cardiac issues but he was cleared with that. States that hurts on the side of the elbow. Reports he also has tenderness along the side of his chest wall on the left. States that Dr. Tamala Julian had him on [facility] and he  recently went off it and realized that he started having more pains since then.      PERTINENT HISTORY:  History of cardiac issues x5 stents, low back pain and neck pain, torn right meniscus getting gel shots   PAIN:  Are you having pain? no: NPRS scale: 0/10 Pain location: left shoulder lateral  Pain description: chirping, sore Aggravating factors: ends of the day/ after doing a lot of things Relieving factors: rest   PRECAUTIONS: None   WEIGHT BEARING RESTRICTIONS No   FALLS:  Has patient fallen in last 6 months? No   OCCUPATION: retired, enjoys walking and swimming    PLOF: Independent   PATIENT GOALS to make it go away so he can go swimming    OBJECTIVE:    DIAGNOSTIC FINDINGS:  Cervical Xray: 01/22/22 IMPRESSION: 1. Diffuse multilevel degenerative change again noted. 2 mm anterolisthesis C4 on C5, most likely degenerative. Multifocal bilateral neural foraminal narrowing again noted. Similar findings noted on prior exam. No evidence of fracture.   2.  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       COGNITION: Overall cognitive status: Within functional limits for tasks assessed     SENSATION: WFL   POSTURE:  Sacral sitting, forward head, rounded shoulders   PALPATION: Increased resting tone on left shoulder girdle with tenderness along UT, deltoid, pecs on right      CERVICAL ROM:    Active ROM A/PROM (deg) 01/29/2022  Flexion WFL  Extension WFL   Right lateral flexion    Left lateral flexion    Right rotation 60 - tight  Left rotation 50 - tight   (Blank rows = not tested)               UE Measurements       Upper Extremity Right 01/29/2022 Left 01/29/2022    A/PROM MMT A/PROM MMT  Shoulder Flexion 170   170    Shoulder Extension [facility]   WFL *    Shoulder Abduction          Shoulder Adduction          Shoulder Internal Rotation T L5 SP - tight   T12 SP     Shoulder External Rotation T2 SP   Top of right shoulder - tight*    Elbow Flexion           Elbow Extension          Wrist Flexion          Wrist Extension          Wrist Supination          Wrist Pronation          Wrist Ulnar Deviation  Wrist Radial Deviation          Grip Strength NA   NA                         (Blank rows = not tested)                      * pain Right hand dominant   CERVICAL SPECIAL TESTS:  Spurling's negative Distraction positive for relief in supine Ulnar tension, median and radial nerve tests on Left       TODAY'S TREATMENT:  02/12/2022 Therapeutic Exercise:            Aerobic:            Seated: shoulder rolls forward and backwards x10 each, neck rolls CW and CCW x10 each, shoulder flexion x5            Standing: shoulder scaption with Red band around wrists 3x12 at wall for support, wall walks with band at wrists red band 5x5 bilateral, shoulder flexion stretch facing wall x15 B, pec stretch at wall x5 10" holds bilateral, pushup on table 4x5 slow and controlled , shoulder ER with Red band at wall and 45 deg of abd 3x10 B   Previous Interventions: Supine: s/l book stretch 2x5 bilateral Prone: shoulder retraction 3x5 5" holds, swimmers 3x5 5" holds   median nerve glide x10,  pec stretch unilateral x3 15" holds bilateral, shoulder exten at wall with good posture x10 5" holds, SHOULDER flexion up wall 2x5 bilateral  Neuromuscular Re-education: overhead lat pull down green band PT assist with tactile cues 3x10 bilateral Manual Therapy: Therapeutic Activity: Self Care: Trigger Point Dry Needling:  Modalities:          PATIENT EDUCATION:  Education details: on HEP Person educated: Patient Education method: Explanation, Media planner, and Handouts Education comprehension: verbalized understanding     HOME EXERCISE PROGRAM: [email or web address]   ASSESSMENT:   CLINICAL IMPRESSION: 02/12/2022 Continued to progress strengthening exercises. Cued and timed patient for adequate rest breaks secondary to patient rushing through exercises and  form being compromised. No pain noted end of session just muscle fatigue. Added new exercises to HEP. Will continue with progressing shoulder exercises as tolerated.   Eval: Patient is a 66 y.o. male who was seen today for physical therapy evaluation and treatment for left cervical radiculopathy. Patient with positive neural tension and postural restrictions that are contributing to current presentation. Answered all questions added exercises to HEP. Patient would greatly benefit from skilled PT to  improve overall function and return him to optimal function.     OBJECTIVE IMPAIRMENTS decreased activity tolerance, decreased ROM, decreased strength, postural dysfunction, and pain.    ACTIVITY LIMITATIONS community activity and working out .    PERSONAL FACTORS Age, Fitness, and 1-2 comorbidities: right knee pain, cardiac history  are also affecting patient's functional outcome.      REHAB POTENTIAL: Good   CLINICAL DECISION MAKING: Stable/uncomplicated   EVALUATION COMPLEXITY: Low     GOALS: Goals reviewed with patient?  yes   SHORT TERM GOALS:   Patient will be independent in self management strategies to improve quality of life and functional outcomes. Baseline: new program Target date: 02/19/2022 Goal status: INITIAL   2.  Patient will report at least 50% improvement in overall symptoms and/or function to demonstrate improved functional mobility Baseline: 0% Target date: 02/19/2022 Goal status: INITIAL   3.  Patient will report radicular  symptoms in left UE to be at least 50% less Baseline: 0% better Target date: 02/19/2022 Goal status: INITIAL         LONG TERM GOALS:   Patient will report at least 75% improvement in overall symptoms and/or function to demonstrate improved functional mobility Baseline: 0% Target date: 03/12/2022 Goal status: INITIAL   2.  Patient will be able to swim without pain or difficulties to return to PLOF Baseline: painful Target date:  03/12/2022 Goal status: INITIAL   3.  Patient will be able to demonstrate painfree left shoulder ROM Baseline: painful Target date: 03/12/2022 Goal status: INITIAL         PLAN: PT FREQUENCY: 2x/week   PT DURATION: 6 weeks   PLANNED INTERVENTIONS: Therapeutic exercises, Therapeutic activity, Neuromuscular re-education, Balance training, Gait training, Patient/Family education, Joint mobilization, Canalith repositioning, Dry Needling, Electrical stimulation, Spinal manipulation, Spinal mobilization, Cryotherapy, Moist heat, Ionotophoresis '4mg'$ /ml Dexamethasone, and Manual therapy   PLAN FOR NEXT SESSION: assess MMT, STM/self STM, thoracic mobility, neck/shoulder motion and strengthening, lat strengthening and mobility   8:40 AM, 02/12/22 Jerene Pitch, DPT Physical Therapy with [facility]ston Sinner

## 2022-02-18 ENCOUNTER — Encounter: Payer: Self-pay | Admitting: Physical Therapy

## 2022-02-18 ENCOUNTER — Ambulatory Visit (INDEPENDENT_AMBULATORY_CARE_PROVIDER_SITE_OTHER): Payer: Medicare Other | Admitting: Physical Therapy

## 2022-02-18 DIAGNOSIS — M542 Cervicalgia: Secondary | ICD-10-CM

## 2022-02-18 DIAGNOSIS — M79602 Pain in left arm: Secondary | ICD-10-CM | POA: Diagnosis not present

## 2022-02-18 NOTE — Therapy (Signed)
OUTPATIENT PHYSICAL THERAPY TREATMENT NOTE   Patient Name: Michael [name] MRN: [id number] DOB:1956/05/31, 66 y.o., male Today's Date: 02/18/2022   END OF SESSION:   PT End of Session - 02/18/22 0927     Visit Number 6    Number of Visits 12    Date for PT Re-Evaluation 03/12/22    PT Start Time 0930    PT Stop Time 1010    PT Time Calculation (min) 40 min             Past Medical History:  Diagnosis Date   Acute lateral meniscal tear, right, initial encounter 09/26/2021   Injection given September 26, 2018.   CAD S/P PCI to Cx & RCA    a. s/p PCI of LCx (2006) and ostium of RCA (2007) as well as DES to RCA in [place] (05/2014)   HTN (hypertension)    Hyperlipidemia    Plantar fasciitis 10/12/2017   Past Surgical History:  Procedure Laterality Date   CARDIAC CATHETERIZATION N/A 05/06/2016   Procedure: Left Heart Cath and Coronary Angiography;  Surgeon: Nelva Bush, MD;  Location: [facility];  Service: Cardiovascular;  Laterality: N/A;   CARDIAC CATHETERIZATION N/A 05/06/2016   Procedure: Coronary Stent Intervention;  Surgeon: Nelva Bush, MD;  Location: [facility];  Service: Cardiovascular;  Laterality: N/A;   CARDIAC CATHETERIZATION N/A 05/13/2016   Procedure: Coronary/Graft Angiography;  Surgeon: Peter M Martinique, MD;  Location: [facility];  Service: Cardiovascular;  Laterality: N/A;   CORONARY ANGIOPLASTY WITH STENT PLACEMENT  2006   in [place], occluded CFX, s/p Taxus stent   CORONARY ANGIOPLASTY WITH STENT PLACEMENT  2007   in [place], ostial RCA stent   CORONARY ANGIOPLASTY WITH STENT PLACEMENT  2015   in [place], [place] stent   [place]      Patient Active Problem List   Diagnosis Date Noted   Coronary artery disease with stable angina pectoris, unspecified vessel or lesion type, unspecified whether native or transplanted heart ([place]) 02/06/2022   Degenerative arthritis of right knee 11/15/2021    Cervical disc disorder with radiculopathy of cervical region 03/15/2021   Left shoulder pain 01/24/2021   AC (acromioclavicular) arthritis 01/24/2021   Abnormal chest x-ray 01/24/2021   Right carpal tunnel syndrome 09/05/2020   Perianal irritation 06/08/2020   Ischemic cardiomyopathy 12/05/2019   History of adenomatous polyp of colon 06/07/2018   BPH (benign prostatic hyperplasia) 06/07/2018   Vitiligo 06/07/2018   Gluteal pain 04/10/2018   Back pain 04/13/2017   Nonallopathic lesion of thoracic region 04/13/2017   Nonallopathic lesion of sacral region 04/13/2017   Nonallopathic lesion of lumbosacral region 04/13/2017   Impaired glucose tolerance 11/03/2016   Obesity    HTN (hypertension)    CAD S/P multiple PCIs 09/08/2014   Dyslipidemia 09/08/2014     PCP: Garret Reddish, MD   REFERRING PROVIDER: Gregor Hams, MD   REFERRING DIAG: M50.10 (ICD-10-CM) - Cervical disc disorder with radiculopathy of cervical region   THERAPY DIAG:  Cervicalgia   Pain in left arm   ONSET DATE: Since April 2023   SUBJECTIVE:  SUBJECTIVE STATEMENT: 02/18/2022 States that shoulder feels good. States that he rested it and didn't it do anything  major. States he does feel that his back has been tight with swimming but that is a long term issue. States that overall he feels about 80% better. States that occasionally gets a tweek but has not had it since last session.  Eval: States that he has been going to the gym at [place] and the resistance training was bothering his shoulders and then he started swimming. States that his pain started in his shoulder and then he started having burning in his hands down the center and hypothenar eminence. This comes  and goes. States he was concerned it was cardiac  issues but he was cleared with that. States that hurts on the side of the elbow. Reports he also has tenderness along the side of his chest wall on the left. States that Dr. Tamala Julian had him on [facility] and he recently went off it and realized that he started having more pains since then.      PERTINENT HISTORY:  History of cardiac issues x5 stents, low back pain and neck pain, torn right meniscus getting gel shots   PAIN:  Are you having pain? no: NPRS scale: 2/10 Pain location: low back  Pain description:  sore Aggravating factors: ends of the day/ after doing a lot of things Relieving factors: rest   PRECAUTIONS: None   WEIGHT BEARING RESTRICTIONS No   FALLS:  Has patient fallen in last 6 months? No   OCCUPATION: retired, enjoys walking and swimming    PLOF: Independent   PATIENT GOALS to make it go away so he can go swimming    OBJECTIVE:    DIAGNOSTIC FINDINGS:  Cervical Xray: 01/22/22 IMPRESSION: 1. Diffuse multilevel degenerative change again noted. 2 mm anterolisthesis C4 on C5, most likely degenerative. Multifocal bilateral neural foraminal narrowing again noted. Similar findings noted on prior exam. No evidence of fracture.   2.  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       COGNITION: Overall cognitive status: Within functional limits for tasks assessed     SENSATION: WFL   POSTURE:  Sacral sitting, forward head, rounded shoulders   PALPATION: Increased resting tone on left shoulder girdle with tenderness along UT, deltoid, pecs on right                     UE Measurements       Upper Extremity Right 02/18/2022 Left 02/18/2022    A/PROM MMT A/PROM MMT  Shoulder Flexion 170   170    Shoulder Extension        Shoulder Abduction  [facility]    [facility]    Shoulder Adduction          Shoulder Internal Rotation T L5 SP - tight   T12 SP     Shoulder External Rotation T4 SP   T4 SP    Elbow Flexion          Elbow Extension          Wrist Flexion          Wrist Extension           Wrist Supination          Wrist Pronation          Wrist Ulnar Deviation          Wrist Radial Deviation          Grip Strength NA  NA                         (Blank rows = not tested)                      * pain Right hand dominant          TODAY'S TREATMENT:  02/18/2022 Therapeutic Exercise:            Aerobic:            Seated: shoulder rolls forward and backwards x10 each, neck rolls CW and CCW x10 each, shoulder flexion x5            Standing: shoulder flexion with dowel at wall 2 minutes, shoulder extension with dowel 2 minutes, shoulder ER at wall 2 minutes, ball stab at wall up/down, lateral, CW, CCW 2x20 B and each, diagonals,  at wall with green band 3x10 bilateral, pec stretch at wall x5 10" holds bilateral   shoulder scaption with Red band around wrists 3x12 at wall for support, wall walks with band at wrists red band 5x5 bilateral, shoulder flexion stretch facing wall x15 B, pec stretch at wall x5 10" holds bilateral, pushup on table 4x5 slow and controlled , shoulder ER with Red band at wall and 45 deg of abd 3x10 B   Previous Interventions: Supine: s/l book stretch 2x5 bilateral Prone: shoulder retraction 3x5 5" holds, swimmers 3x5 5" holds   median nerve glide x10,  pec stretch unilateral x3 15" holds bilateral, shoulder exten at wall with good posture x10 5" holds, SHOULDER flexion up wall 2x5 bilateral  Neuromuscular Re-education: overhead lat pull down green band PT assist with tactile cues 3x10 bilateral Manual Therapy: Therapeutic Activity: Self Care: Trigger Point Dry Needling:  Modalities:          PATIENT EDUCATION:  Education details: on HEP, on progress and POC Person educated: Patient Education method: Explanation, Demonstration, and Handouts Education comprehension: verbalized understanding     HOME EXERCISE PROGRAM: [email or web address]   ASSESSMENT:   CLINICAL IMPRESSION: 02/18/2022 All short term goals met at this time and 2/3 long tem  goals met. Tolerated session well and reported no pain but fatigue in both arms end of session. Discussed curret progress and plan moving forward. Will continue with current POC as tolerated.   Eval: Patient is a 67 y.o. male who was seen today for physical therapy evaluation and treatment for left cervical radiculopathy. Patient with positive neural tension and postural restrictions that are contributing to current presentation. Answered all questions added exercises to HEP. Patient would greatly benefit from skilled PT to  improve overall function and return him to optimal function.     OBJECTIVE IMPAIRMENTS decreased activity tolerance, decreased ROM, decreased strength, postural dysfunction, and pain.    ACTIVITY LIMITATIONS community activity and working out .    PERSONAL FACTORS Age, Fitness, and 1-2 comorbidities: right knee pain, cardiac history  are also affecting patient's functional outcome.      REHAB POTENTIAL: Good   CLINICAL DECISION MAKING: Stable/uncomplicated   EVALUATION COMPLEXITY: Low     GOALS: Goals reviewed with patient?  yes   SHORT TERM GOALS:   Patient will be independent in self management strategies to improve quality of life and functional outcomes. Baseline: new program Target date: 02/19/2022 Goal status: MET   2.  Patient will report at least 50% improvement in overall symptoms and/or function to demonstrate improved functional mobility  Baseline: 0% Target date: 02/19/2022 Goal status: MET   3.  Patient will report radicular symptoms in left UE to be at least 50% less Baseline: 0% better Target date: 02/19/2022 Goal status: MET - 80% better.         LONG TERM GOALS:   Patient will report at least 75% improvement in overall symptoms and/or function to demonstrate improved functional mobility Baseline: 0% Target date: 03/12/2022 Goal status: MET   2.  Patient will be able to swim without pain or difficulties to return to PLOF Baseline:  painful Target date: 03/12/2022 Goal status: PROGRESSING   3.  Patient will be able to demonstrate painfree left shoulder ROM Baseline: painful Target date: 03/12/2022 Goal status: MET         PLAN: PT FREQUENCY: 2x/week   PT DURATION: 6 weeks   PLANNED INTERVENTIONS: Therapeutic exercises, Therapeutic activity, Neuromuscular re-education, Balance training, Gait training, Patient/Family education, Joint mobilization, Canalith repositioning, Dry Needling, Electrical stimulation, Spinal manipulation, Spinal mobilization, Cryotherapy, Moist heat, Ionotophoresis 95m/ml Dexamethasone, and Manual therapy   PLAN FOR NEXT SESSION:continue with current POC, next visit possible last visit pending pt presentation.   10:13 AM, 02/18/22 MJerene Pitch DPT Physical Therapy with CRoyston Sinner

## 2022-02-20 ENCOUNTER — Ambulatory Visit (INDEPENDENT_AMBULATORY_CARE_PROVIDER_SITE_OTHER): Payer: Medicare Other | Admitting: Physical Therapy

## 2022-02-20 ENCOUNTER — Encounter: Payer: Self-pay | Admitting: Physical Therapy

## 2022-02-20 DIAGNOSIS — M79602 Pain in left arm: Secondary | ICD-10-CM

## 2022-02-20 DIAGNOSIS — M542 Cervicalgia: Secondary | ICD-10-CM

## 2022-02-20 NOTE — Therapy (Signed)
OUTPATIENT PHYSICAL THERAPY TREATMENT NOTE and Discharge Note   PHYSICAL THERAPY DISCHARGE SUMMARY  Visits from Start of Care: 7  Current functional level related to goals / functional outcomes: All but one goal met at this time   Remaining deficits: Continued weakness   Education / Equipment: See below   Patient agrees to discharge. Patient goals were partially met. Patient is being discharged due to being pleased with the current functional level.   Patient Name: Michael [name] MRN: [id number] DOB:1956-09-19, 66 y.o., male Today's Date: 02/20/2022   END OF SESSION:   PT End of Session - 02/20/22 0930     Visit Number 7    Number of Visits 12    Date for PT Re-Evaluation 03/12/22    PT Start Time 0930    PT Stop Time 8832    PT Time Calculation (min) 38 min             Past Medical History:  Diagnosis Date   Acute lateral meniscal tear, right, initial encounter 09/26/2021   Injection given September 26, 2018.   CAD S/P PCI to Cx & RCA    a. s/p PCI of LCx (2006) and ostium of RCA (2007) as well as DES to RCA in [place] (05/2014)   HTN (hypertension)    Hyperlipidemia    Plantar fasciitis 10/12/2017   Past Surgical History:  Procedure Laterality Date   CARDIAC CATHETERIZATION N/A 05/06/2016   Procedure: Left Heart Cath and Coronary Angiography;  Surgeon: Nelva Bush, MD;  Location: [facility];  Service: Cardiovascular;  Laterality: N/A;   CARDIAC CATHETERIZATION N/A 05/06/2016   Procedure: Coronary Stent Intervention;  Surgeon: Nelva Bush, MD;  Location: [facility];  Service: Cardiovascular;  Laterality: N/A;   CARDIAC CATHETERIZATION N/A 05/13/2016   Procedure: Coronary/Graft Angiography;  Surgeon: Peter M Martinique, MD;  Location: [facility];  Service: Cardiovascular;  Laterality: N/A;   CORONARY ANGIOPLASTY WITH STENT PLACEMENT  2006   in [place], occluded CFX, s/p Taxus stent   CORONARY ANGIOPLASTY WITH STENT PLACEMENT   2007   in [place], ostial RCA stent   CORONARY ANGIOPLASTY WITH STENT PLACEMENT  2015   in [place], [place] stent   [place]      Patient Active Problem List   Diagnosis Date Noted   Coronary artery disease with stable angina pectoris, unspecified vessel or lesion type, unspecified whether native or transplanted heart ([place]) 02/06/2022   Degenerative arthritis of right knee 11/15/2021   Cervical disc disorder with radiculopathy of cervical region 03/15/2021   Left shoulder pain 01/24/2021   AC (acromioclavicular) arthritis 01/24/2021   Abnormal chest x-ray 01/24/2021   Right carpal tunnel syndrome 09/05/2020   Perianal irritation 06/08/2020   Ischemic cardiomyopathy 12/05/2019   History of adenomatous polyp of colon 06/07/2018   BPH (benign prostatic hyperplasia) 06/07/2018   Vitiligo 06/07/2018   Gluteal pain 04/10/2018   Back pain 04/13/2017   Nonallopathic lesion of thoracic region 04/13/2017   Nonallopathic lesion of sacral region 04/13/2017   Nonallopathic lesion of lumbosacral region 04/13/2017   Impaired glucose tolerance 11/03/2016   Obesity    HTN (hypertension)    CAD S/P multiple PCIs 09/08/2014   Dyslipidemia 09/08/2014     PCP: Garret Reddish, MD   REFERRING PROVIDER: Gregor Hams, MD   REFERRING DIAG: M50.10 (ICD-10-CM) - Cervical disc disorder with radiculopathy of cervical region   THERAPY DIAG:  Cervicalgia   Pain in left arm  ONSET DATE: Since April 2023   SUBJECTIVE:                                                                                                                                                                                                          SUBJECTIVE STATEMENT: 02/20/2022 Reports overall 85% in regards to his left shoulder pain. Would like to conclude therapy for his left shoulder inquires about low back.  Eval: States that he has been going to the gym at [place] and the resistance training  was bothering his shoulders and then he started swimming. States that his pain started in his shoulder and then he started having burning in his hands down the center and hypothenar eminence. This comes  and goes. States he was concerned it was cardiac issues but he was cleared with that. States that hurts on the side of the elbow. Reports he also has tenderness along the side of his chest wall on the left. States that Dr. Tamala Julian had him on [facility] and he recently went off it and realized that he started having more pains since then.      PERTINENT HISTORY:  History of cardiac issues x5 stents, low back pain and neck pain, torn right meniscus getting gel shots   PAIN:  Are you having pain? no: NPRS scale: 0/10 Pain location: low back  Pain description:  sore Aggravating factors: ends of the day/ after doing a lot of things Relieving factors: rest   PRECAUTIONS: None   WEIGHT BEARING RESTRICTIONS No   FALLS:  Has patient fallen in last 6 months? No   OCCUPATION: retired, enjoys walking and swimming    PLOF: Independent   PATIENT GOALS to make it go away so he can go swimming    OBJECTIVE:    DIAGNOSTIC FINDINGS:  Cervical Xray: 01/22/22 IMPRESSION: 1. Diffuse multilevel degenerative change again noted. 2 mm anterolisthesis C4 on C5, most likely degenerative. Multifocal bilateral neural foraminal narrowing again noted. Similar findings noted on prior exam. No evidence of fracture.   2.  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       COGNITION: Overall cognitive status: Within functional limits for tasks assessed     SENSATION: WFL   POSTURE:  Sacral sitting, forward head, rounded shoulders   PALPATION: Increased resting tone on left shoulder girdle with tenderness along UT, deltoid, pecs on right                     UE Measurements       Upper Extremity Right  02/18/2022 Left 02/18/2022    A/PROM MMT A/PROM MMT  Shoulder Flexion 170   170    Shoulder Extension         Shoulder Abduction  [facility]    WFL    Shoulder Adduction          Shoulder Internal Rotation T L5 SP - tight   T12 SP     Shoulder External Rotation T4 SP   T4 SP    Elbow Flexion          Elbow Extension          Wrist Flexion          Wrist Extension          Wrist Supination          Wrist Pronation          Wrist Ulnar Deviation          Wrist Radial Deviation          Grip Strength NA   NA                         (Blank rows = not tested)                      * pain Right hand dominant          TODAY'S TREATMENT:  02/20/2022 Therapeutic Exercise:            Aerobic:            Seated: neural tension left median x10             Standing: D1 single arm extension 2x12 B, high row x15 green theraband, shoulder scaption with medial pull 4x5 B red theraband     PATIENT EDUCATION:  Education details: on HEP, on progress and POC, on sitting posture with lumbar support and anatomy, on LBP HEP from previous PT and stretches to focus on. Person educated: Patient Education method: Explanation, Demonstration, and Handouts Education comprehension: verbalized understanding     HOME EXERCISE PROGRAM: [email or web address]   ASSESSMENT:   CLINICAL IMPRESSION: 02/20/2022 All short term goals met and 2/3 long term goals met at this time. Reviewed previous HEP for low back secondary to complaint of increased back pain with sitting. Discussed following up with MD in regards to his back pain in case he wants to pursue PT for LBP. Discussed using lumbar support and active sitting posture instead of tailbone sitting. This felt better when practiced in clinic. Answered all questions and patient to discharge from PT to HEP at this time secondary to progress made and independence in HEP.       OBJECTIVE IMPAIRMENTS decreased activity tolerance, decreased ROM, decreased strength, postural dysfunction, and pain.    ACTIVITY LIMITATIONS community activity and working out .    PERSONAL FACTORS Age, Fitness,  and 1-2 comorbidities: right knee pain, cardiac history  are also affecting patient's functional outcome.      REHAB POTENTIAL: Good   CLINICAL DECISION MAKING: Stable/uncomplicated   EVALUATION COMPLEXITY: Low     GOALS: Goals reviewed with patient?  yes   SHORT TERM GOALS:   Patient will be independent in self management strategies to improve quality of life and functional outcomes. Baseline: new program Target date: 02/19/2022 Goal status: MET   2.  Patient will report at least 50% improvement in overall symptoms and/or function to demonstrate improved functional mobility Baseline: 0% Target  date: 02/19/2022 Goal status: MET   3.  Patient will report radicular symptoms in left UE to be at least 50% less Baseline: 0% better Target date: 02/19/2022 Goal status: MET - 80% better.         LONG TERM GOALS:   Patient will report at least 75% improvement in overall symptoms and/or function to demonstrate improved functional mobility Baseline: 0% Target date: 03/12/2022 Goal status: MET   2.  Patient will be able to swim without pain or difficulties to return to PLOF Baseline: painful Target date: 03/12/2022 Goal status: PROGRESSING   3.  Patient will be able to demonstrate painfree left shoulder ROM Baseline: painful Target date: 03/12/2022 Goal status: MET         PLAN: PT FREQUENCY: 2x/week   PT DURATION: 6 weeks   PLANNED INTERVENTIONS: Therapeutic exercises, Therapeutic activity, Neuromuscular re-education, Balance training, Gait training, Patient/Family education, Joint mobilization, Canalith repositioning, Dry Needling, Electrical stimulation, Spinal manipulation, Spinal mobilization, Cryotherapy, Moist heat, Ionotophoresis 97m/ml Dexamethasone, and Manual therapy   PLAN FOR NEXT SESSION:DC to HEP    10:10 AM, 02/20/22 MJerene Pitch DPT Physical Therapy with CRoyston Sinner

## 2022-02-24 ENCOUNTER — Encounter: Payer: Medicare Other | Admitting: Physical Therapy

## 2022-02-26 NOTE — Progress Notes (Signed)
Michael [name] [address] [place] Phone: [phone] Subjective:   Michael [name], am serving as a scribe for Dr. Hulan Saas.  I'm seeing this patient by the request  of:  [name], MD  CC: back and neck pain   BHA:[id number]  Michael [name] is a 66 y.o. male coming in with complaint of back and neck pain. OMT 01/06/2022. Saw Dr Georgina Snell for shoulder pain which was more of a cervical pathology. Patient has been doing PT. Patient states now having pain in right side SI and gluteal pain with some motions. Sitting to standing.  Medications patient has been prescribed: None  Taking:         Reviewed prior external information including notes and imaging from previsou exam, outside providers and external EMR if available.   As well as notes that were available from care everywhere and other healthcare systems.  Past medical history, social, surgical and family history all reviewed in electronic medical record.  No pertanent information unless stated regarding to the chief complaint.   Past Medical History:  Diagnosis Date   Acute lateral meniscal tear, right, initial encounter 09/26/2021   Injection given September 26, 2018.   CAD S/P PCI to Cx & RCA    a. s/p PCI of LCx (2006) and ostium of RCA (2007) as well as DES to RCA in [place] (05/2014)   HTN (hypertension)    Hyperlipidemia    Plantar fasciitis 10/12/2017    No Known Allergies   Review of Systems:  No headache, visual changes, nausea, vomiting, diarrhea, constipation, dizziness, abdominal pain, skin rash, fevers, chills, night sweats, weight loss, swollen lymph nodes, body aches, joint swelling, chest pain, shortness of breath, mood changes. POSITIVE muscle aches  Objective  Blood pressure 116/64, pulse 71, height '5\' 6"'$  (1.676 m), weight 194 lb (88 kg), SpO2 99 %.   General: No apparent distress alert and oriented x3 mood and affect normal, dressed  appropriately.  HEENT: Pupils equal, extraocular movements intact  Respiratory: Patient's speak in full sentences and does not appear short of breath  Cardiovascular: No lower extremity edema, non tender, no erythema  Low back exam does have significant increase in tightness from patient previous exams.  Patient is tender to palpation in the paraspinal musculature.  Patient does have severe tenderness in the right gluteal area.  Procedure: Real-time Ultrasound Guided Injection of right gluteal tendon sheath Device: GE Logiq Q7 Ultrasound guided injection is preferred based studies that show increased duration, increased effect, greater accuracy, decreased procedural pain, increased response rate, and decreased cost with ultrasound guided versus blind injection.  Verbal informed consent obtained.  Time-out conducted.  Noted no overlying erythema, induration, or other signs of local infection.  Skin prepped in a sterile fashion.  Local anesthesia: Topical Ethyl chloride.  With sterile technique and under real time ultrasound guidance: With a 21-gauge 2 inch needle injected into the tendon sheath.  A total of 1 cc of 0.5% Marcaine and 1 cc of Kenalog 40 mg/mL.  No blood loss.  Postinjection instructions given Completed without difficulty  Pain immediately resolved suggesting accurate placement of the medication.  Advised to call if fevers/chills, erythema, induration, drainage, or persistent bleeding.  Impression: Technically successful ultrasound guided injection.     Assessment and Plan:  Back pain Chronic problem with mild worsening symptoms.  Discussed with posture and ergonomics, discussed which activities to do which ones to avoid.  Could be potentially  some radicular symptoms also contributing.  Patient will increase activity otherwise slowly.  Follow-up with me again in 6 to 8 weeks  Gluteal tendinitis of right buttock Today distal tendon injection and patient had significant  decrease in pain almost immediately.  Discussed with patient about icing regimen and home exercises, discussed about core strengthening and hip abductor strengthening.  Increase activity slowly.  Follow-up with me again in 6 to 8 weeks.        The above documentation has been reviewed and is accurate and complete [name], DO        Note: This dictation was prepared with Dragon dictation along with smaller phrase technology. Any transcriptional errors that result from this process are unintentional.

## 2022-03-03 ENCOUNTER — Encounter: Payer: Medicare Other | Admitting: Physical Therapy

## 2022-03-04 ENCOUNTER — Ambulatory Visit: Payer: Self-pay

## 2022-03-04 ENCOUNTER — Telehealth: Payer: Self-pay | Admitting: Gastroenterology

## 2022-03-04 ENCOUNTER — Ambulatory Visit (INDEPENDENT_AMBULATORY_CARE_PROVIDER_SITE_OTHER): Payer: Medicare Other | Admitting: Family Medicine

## 2022-03-04 VITALS — BP 116/64 | HR 71 | Ht 66.0 in | Wt 194.0 lb

## 2022-03-04 DIAGNOSIS — M549 Dorsalgia, unspecified: Secondary | ICD-10-CM | POA: Diagnosis not present

## 2022-03-04 DIAGNOSIS — G8929 Other chronic pain: Secondary | ICD-10-CM | POA: Diagnosis not present

## 2022-03-04 DIAGNOSIS — M999 Biomechanical lesion, unspecified: Secondary | ICD-10-CM

## 2022-03-04 DIAGNOSIS — I255 Ischemic cardiomyopathy: Secondary | ICD-10-CM

## 2022-03-04 DIAGNOSIS — M7601 Gluteal tendinitis, right hip: Secondary | ICD-10-CM

## 2022-03-04 MED ORDER — HYDROCORTISONE (PERIANAL) 2.5 % EX CREA: 1.0000 "application " | TOPICAL_CREAM | Freq: Two times a day (BID) | CUTANEOUS | 0 refills | Status: DC

## 2022-03-04 NOTE — Telephone Encounter (Signed)
Approved Hydrocortisone cream refill one time only. Patient needs office visit for additional refills.

## 2022-03-04 NOTE — Assessment & Plan Note (Signed)
Today distal tendon injection and patient had significant decrease in pain almost immediately.  Discussed with patient about icing regimen and home exercises, discussed about core strengthening and hip abductor strengthening.  Increase activity slowly.  Follow-up with me again in 6 to 8 weeks.

## 2022-03-04 NOTE — Telephone Encounter (Signed)
Inbound call from patient requesting a call back to discuss getting another proscription for Hydrocortisone cream. Please advise.

## 2022-03-04 NOTE — Patient Instructions (Addendum)
Injection today Hip strengthening exercises Ice after activity See you again in 4-6 weeks

## 2022-03-04 NOTE — Assessment & Plan Note (Signed)
Chronic problem with mild worsening symptoms.  Discussed with posture and ergonomics, discussed which activities to do which ones to avoid.  Could be potentially some radicular symptoms also contributing.  Patient will increase activity otherwise slowly.  Follow-up with me again in 6 to 8 weeks

## 2022-03-05 ENCOUNTER — Encounter: Payer: Medicare Other | Admitting: Physical Therapy

## 2022-03-21 ENCOUNTER — Ambulatory Visit: Payer: Medicare Other | Admitting: Family Medicine

## 2022-03-21 NOTE — Progress Notes (Signed)
Michael [name] [place] [address] [place] Phone: [phone] Subjective:   IVilma [name], am serving as a scribe for Dr. Hulan Saas.  I'm seeing this patient by the request  of:  [name], MD  CC: glute and low back pain   [id number]  03/04/2022 Today distal tendon injection and patient had significant decrease in pain almost immediately.  Discussed with patient about icing regimen and home exercises, discussed about core strengthening and hip abductor strengthening.  Increase activity slowly.  Follow-up with me again in 6 to 8 weeks.  Update 04/02/2022 Michael [name] is a 66 y.o. male coming in with complaint of R glute pain and LBP.  Last exam we did an injection into the gluteal tendon.  Patient states doing a lot better. Injection did help. Swimming and walking. Here for manipulation.       Past Medical History:  Diagnosis Date   Acute lateral meniscal tear, right, initial encounter 09/26/2021   Injection given September 26, 2018.   CAD S/P PCI to Cx & RCA    a. s/p PCI of LCx (2006) and ostium of RCA (2007) as well as DES to RCA in [place] (05/2014)   HTN (hypertension)    Hyperlipidemia    Plantar fasciitis 10/12/2017   Past Surgical History:  Procedure Laterality Date   CARDIAC CATHETERIZATION N/A 05/06/2016   Procedure: Left Heart Cath and Coronary Angiography;  Surgeon: Nelva Bush, MD;  Location: [facility];  Service: Cardiovascular;  Laterality: N/A;   CARDIAC CATHETERIZATION N/A 05/06/2016   Procedure: Coronary Stent Intervention;  Surgeon: Nelva Bush, MD;  Location: [facility];  Service: Cardiovascular;  Laterality: N/A;   CARDIAC CATHETERIZATION N/A 05/13/2016   Procedure: Coronary/Graft Angiography;  Surgeon: Peter M Martinique, MD;  Location: [facility];  Service: Cardiovascular;  Laterality: N/A;   CORONARY ANGIOPLASTY WITH STENT PLACEMENT  2006   in [place], occluded CFX,  s/p Taxus stent   CORONARY ANGIOPLASTY WITH STENT PLACEMENT  2007   in [place], ostial RCA stent   CORONARY ANGIOPLASTY WITH STENT PLACEMENT  2015   in [place], RCA stent   TONSILLECTOMY AND ADENOIDECTOMY      Social History   Socioeconomic History   Marital status: Married    Spouse name: Not on file   Number of children: 4   Years of education: Not on file   Highest education level: Not on file  Occupational History   Occupation: retired  Tobacco Use   Smoking status: Never   Smokeless tobacco: Never  Vaping Use   Vaping Use: Never used  Substance and Sexual Activity   Alcohol use: No    Alcohol/week: 0.0 standard drinks of alcohol   Drug use: No   Sexual activity: Yes  Other Topics Concern   Not on file  Social History Narrative   Lives with wife.  4 kids total (2 step, 2 biological). 1 that has CP. Daughter [name]- comes to [facility]).        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      Hobbies: gardening, lawn care, care for grandkids   Social Determinants of Health   Financial Resource Strain: Low Risk  (09/24/2021)   Overall Financial Resource Strain (CARDIA)    Difficulty of Paying Living Expenses: Not hard at all  Food Insecurity: No Food Insecurity (09/24/2021)   Hunger Vital Sign    Worried About Running Out  of Food in the Last Year: Never true    [place] in the Last Year: Never true  Transportation Needs: No Transportation Needs (09/24/2021)   PRAPARE - Hydrologist (Medical): No    Lack of Transportation (Non-Medical): No  Physical Activity: Inactive (09/24/2021)   Exercise Vital Sign    Days of Exercise per Week: 0 days    Minutes of Exercise per Session: 0 min  Stress: No Stress Concern Present (09/24/2021)   [place]    Feeling of Stress : Not at all  Social Connections: Unknown (09/24/2021)   Social Connection and Isolation  Panel [NHANES]    Frequency of Communication with Friends and Family: More than three times a week    Frequency of Social Gatherings with Friends and Family: More than three times a week    Attends Religious Services: Not on Advertising copywriter or Organizations: Not on file    Attends Archivist Meetings: Not on file    Marital Status: Married   No Known Allergies Family History  Problem Relation Age of Onset   Hypertension Father    Dementia Father        Vascular. patient states dementia/possible alzheimers as well   Hyperlipidemia Sister    Cancer Brother        type unknown   Cancer Brother        type unknown   Heart disease Maternal Grandfather 31       Died probably of heart diseasse   Heart disease Maternal Uncle 63       Died of "heart exploding"   Cerebral palsy Daughter    Stroke Son        severe problems with blood clotting   Colon cancer Neg Hx    Esophageal cancer Neg Hx    Pancreatic cancer Neg Hx    Stomach cancer Neg Hx    Liver disease Neg Hx      Current Outpatient Medications (Cardiovascular):    atorvastatin (LIPITOR) 80 MG tablet, TAKE 1 TABLET DAILY   EPINEPHrine 0.3 mg/0.3 mL IJ SOAJ injection, See admin instructions.   isosorbide mononitrate (IMDUR) 60 MG 24 hr tablet, TAKE 1 TABLET DAILY   nitroGLYCERIN (NITROSTAT) 0.4 MG SL tablet, DISSOLVE 1 TABLET UNDER THE TONGUE EVERY 5 MINUTES AS NEEDED FOR CHEST PAIN UP TO 3 DOSES   Current Outpatient Medications (Analgesics):    aspirin 81 MG tablet, Take 81 mg by mouth daily.  Current Outpatient Medications (Hematological):    BRILINTA 90 MG TABS tablet, TAKE 1 TABLET TWICE A DAY  Current Outpatient Medications (Other):    CHERRY PO, Take 50 mg by mouth daily. Cherry extract   Cinnamon 500 MG capsule, Take 500 mg by mouth daily.   hydrocortisone (ANUSOL-HC) 2.5 % rectal cream, Place 1 application  rectally 2 (two) times daily. Patient needs office visit.   Turmeric 500 MG  CAPS, Take 500 mg by mouth daily.   Vitamin D, Ergocalciferol, (DRISDOL) 1.25 MG (50000 UNIT) CAPS capsule, TAKE 1 CAPSULE EVERY 7 DAYS   Reviewed prior external information including notes and imaging from  primary care provider As well as notes that were available from care everywhere and other healthcare systems.  Past medical history, social, surgical and family history all reviewed in electronic medical record.  No pertanent information unless stated regarding to the chief complaint.  Review of Systems:  No headache, visual changes, nausea, vomiting, diarrhea, constipation, dizziness, abdominal pain, skin rash, fevers, chills, night sweats, weight loss, swollen lymph nodes, body aches, joint swelling, chest pain, shortness of breath, mood changes. POSITIVE muscle aches  Objective  Blood pressure 128/70, pulse 69, height '5\' 6"'$  (1.676 m), weight 194 lb (88 kg), SpO2 98 %.   General: No apparent distress alert and oriented x3 mood and affect normal, dressed appropriately.  HEENT: Pupils equal, extraocular movements intact  Respiratory: Patient's speak in full sentences and does not appear short of breath  Cardiovascular: No lower extremity edema, non tender, no erythema  Low back exam still has some loss of lordosis.  Some tenderness to palpation in the paraspinal musculature.  Less tenderness over the gluteal tendon than previous exam. Mild improvement in range of motion    Osteopathic findings C2 flexed rotated and side bent right C5 flexed rotated and side bent left T3 extended rotated and side bent right inhaled third rib T6 extended rotated and side bent left L2 flexed rotated and side bent right Sacrum right on right    Impression and Recommendations:    The above documentation has been reviewed and is accurate and complete [name], DO

## 2022-04-02 ENCOUNTER — Ambulatory Visit (INDEPENDENT_AMBULATORY_CARE_PROVIDER_SITE_OTHER): Payer: Medicare Other | Admitting: Family Medicine

## 2022-04-02 VITALS — BP 128/70 | HR 69 | Ht 66.0 in | Wt 194.0 lb

## 2022-04-02 DIAGNOSIS — M9902 Segmental and somatic dysfunction of thoracic region: Secondary | ICD-10-CM | POA: Diagnosis not present

## 2022-04-02 DIAGNOSIS — M9908 Segmental and somatic dysfunction of rib cage: Secondary | ICD-10-CM | POA: Diagnosis not present

## 2022-04-02 DIAGNOSIS — M9901 Segmental and somatic dysfunction of cervical region: Secondary | ICD-10-CM | POA: Diagnosis not present

## 2022-04-02 DIAGNOSIS — M9903 Segmental and somatic dysfunction of lumbar region: Secondary | ICD-10-CM

## 2022-04-02 DIAGNOSIS — M9904 Segmental and somatic dysfunction of sacral region: Secondary | ICD-10-CM | POA: Diagnosis not present

## 2022-04-02 DIAGNOSIS — M999 Biomechanical lesion, unspecified: Secondary | ICD-10-CM

## 2022-04-02 DIAGNOSIS — I255 Ischemic cardiomyopathy: Secondary | ICD-10-CM

## 2022-04-02 DIAGNOSIS — M7601 Gluteal tendinitis, right hip: Secondary | ICD-10-CM | POA: Diagnosis not present

## 2022-04-02 NOTE — Assessment & Plan Note (Signed)
Significant improvement after the last injection.  Discussed icing regimen and home exercises.  Discussed which activities to do and which ones to avoid.  Follow-up with me again in 6 to 8 weeks.

## 2022-04-02 NOTE — Assessment & Plan Note (Signed)

## 2022-04-02 NOTE — Patient Instructions (Signed)
Good to see you! Try to walk on flatter surface when available Have great time on cruise then get back to walking and swimming See you again in 6 weeks

## 2022-05-08 NOTE — Progress Notes (Signed)
[place] [address] [place] Phone: [phone] Subjective:   [name], am serving as a scribe for Dr. Hulan Saas.  I'm seeing this patient by the request  of:  [name], MD  CC: Low back pain  [id number]  Michael [name] is a 66 y.o. male coming in with complaint of back and neck pain. OMT 04/02/2022. Patient states doing well. Left shoulder is feeling better. No new issues.  Medications patient has been prescribed: None  Taking:         Reviewed prior external information including notes and imaging from previsou exam, outside providers and external EMR if available.   As well as notes that were available from care everywhere and other healthcare systems.  Past medical history, social, surgical and family history all reviewed in electronic medical record.  No pertanent information unless stated regarding to the chief complaint.   Past Medical History:  Diagnosis Date   Acute lateral meniscal tear, right, initial encounter 09/26/2021   Injection given September 26, 2018.   CAD S/P PCI to Cx & RCA    a. s/p PCI of LCx (2006) and ostium of RCA (2007) as well as DES to [place]CA in [place] (05/2014)   HTN (hypertension)    Hyperlipidemia    Plantar fasciitis 10/12/2017    No Known Allergies   Review of Systems:  No headache, visual changes, nausea, vomiting, diarrhea, constipation, dizziness, abdominal pain, skin rash, fevers, chills, night sweats, weight loss, swollen lymph nodes, body aches, joint swelling, chest pain, shortness of breath, mood changes. POSITIVE muscle aches  Objective  Blood pressure 114/66, pulse 76, height '5\' 6"'$  (1.676 m), weight 193 lb (87.5 kg), SpO2 96 %.   General: No apparent distress alert and oriented x3 mood and affect normal, dressed appropriately.  HEENT: Pupils equal, extraocular movements intact  Respiratory: Patient's speak in full sentences and does not appear short  of breath  Cardiovascular: No lower extremity edema, non tender, no erythema  Gait MSK:  Back low back exam still has tightness noted in the paraspinal musculature.  Seems to be more on the left greater than right.  Does have a bit of tightness still noted in the gluteal area that is more right than left mild limitation in the neck with bilateral sidebending  Osteopathic findings  C2 flexed rotated and side bent right C6 flexed rotated and side bent left T3 extended rotated and side bent left  inhaled rib T9 extended rotated and side bent left L3 flexed rotated and side bent left Sacrum right on right       Assessment and Plan:  Gluteal tendinitis of right buttock Continues to do relatively well overall.  We will repeat injections if necessary but patient has had near complete resolution of some of the pain.  Patient has been able to increase activity slowly.  Follow-up with me again in 6 to 8 weeks.    Nonallopathic problems  Decision today to treat with OMT was based on Physical Exam  After verbal consent patient was treated with HVLA, ME, FPR techniques in cervical, rib, thoracic, lumbar, and sacral  areas  Patient tolerated the procedure well with improvement in symptoms  Patient given exercises, stretches and lifestyle modifications  See medications in patient instructions if given  Patient will follow up in 4-8 weeks     The above documentation has been reviewed and is accurate and complete [name], DO  Note: This dictation was prepared with Dragon dictation along with smaller phrase technology. Any transcriptional errors that result from this process are unintentional.

## 2022-05-14 ENCOUNTER — Ambulatory Visit (INDEPENDENT_AMBULATORY_CARE_PROVIDER_SITE_OTHER): Payer: Medicare Other | Admitting: Family Medicine

## 2022-05-14 VITALS — BP 114/66 | HR 76 | Ht 66.0 in | Wt 193.0 lb

## 2022-05-14 DIAGNOSIS — I255 Ischemic cardiomyopathy: Secondary | ICD-10-CM

## 2022-05-14 DIAGNOSIS — M9903 Segmental and somatic dysfunction of lumbar region: Secondary | ICD-10-CM | POA: Diagnosis not present

## 2022-05-14 DIAGNOSIS — M9904 Segmental and somatic dysfunction of sacral region: Secondary | ICD-10-CM

## 2022-05-14 DIAGNOSIS — M9901 Segmental and somatic dysfunction of cervical region: Secondary | ICD-10-CM | POA: Diagnosis not present

## 2022-05-14 DIAGNOSIS — M9908 Segmental and somatic dysfunction of rib cage: Secondary | ICD-10-CM

## 2022-05-14 DIAGNOSIS — M9902 Segmental and somatic dysfunction of thoracic region: Secondary | ICD-10-CM

## 2022-05-14 DIAGNOSIS — M7601 Gluteal tendinitis, right hip: Secondary | ICD-10-CM | POA: Diagnosis not present

## 2022-05-14 NOTE — Assessment & Plan Note (Signed)
Continues to do relatively well overall.  We will repeat injections if necessary but patient has had near complete resolution of some of the pain.  Patient has been able to increase activity slowly.  Follow-up with me again in 6 to 8 weeks.

## 2022-05-14 NOTE — Patient Instructions (Signed)
Good to see you! Keep being active

## 2022-05-17 ENCOUNTER — Other Ambulatory Visit: Payer: Self-pay | Admitting: Cardiology

## 2022-06-13 ENCOUNTER — Other Ambulatory Visit: Payer: Self-pay | Admitting: Cardiology

## 2022-06-16 ENCOUNTER — Encounter: Payer: Self-pay | Admitting: *Deleted

## 2022-06-26 NOTE — Progress Notes (Signed)
[place] Phone: [phone] Subjective:   [name], am serving as a scribe for Dr. Hulan Saas.  I'm seeing this patient by the request  of:  [name], MD  CC: Back and neck pain follow-up  [id number]  Davontae Prusinski is a 66 y.o. male coming in with complaint of back and neck pain. OMT 05/14/2022. Patient states that his L SI joint has been painful on and off. Did some yardwork on Saturday aggravated L glute up into the lumbar spine.   Medications patient has been prescribed: None  Taking:         Reviewed prior external information including notes and imaging from previsou exam, outside providers and external EMR if available.   As well as notes that were available from care everywhere and other healthcare systems.  Past medical history, social, surgical and family history all reviewed in electronic medical record.  No pertanent information unless stated regarding to the chief complaint.   Past Medical History:  Diagnosis Date   Acute lateral meniscal tear, right, initial encounter 09/26/2021   Injection given September 26, 2018.   CAD S/P PCI to Cx & RCA    a. s/p PCI of LCx (2006) and ostium of RCA (2007) as well as DES to RCA in [place] (05/2014)   HTN (hypertension)    Hyperlipidemia    Plantar fasciitis 10/12/2017    No Known Allergies   Review of Systems:  No headache, visual changes, nausea, vomiting, diarrhea, constipation, dizziness, abdominal pain, skin rash, fevers, chills, night sweats, weight loss, swollen lymph nodes, body aches, joint swelling, chest pain, shortness of breath, mood changes. POSITIVE muscle aches  Objective  Blood pressure 132/72, pulse 78, height '5\' 6"'$  (1.676 m), weight 194 lb (88 kg), SpO2 98 %.   General: No apparent distress alert and oriented x3 mood and affect normal, dressed appropriately.  HEENT: Pupils equal, extraocular movements  intact  Respiratory: Patient's speak in full sentences and does not appear short of breath  Cardiovascular: No lower extremity edema, non tender, no erythema  Gait MSK:  Back back exam does have some loss of lordosis.  Some tenderness to palpation in the paraspinal musculature.  Continues to have tightness with FABER test bilaterally but does seem to be right greater than left.  Still some pain in the  Osteopathic findings  C4 flexed rotated and side bent right C7 flexed rotated and side bent left T5 extended rotated and side bent right inhaled rib L2 flexed rotated and side bent right Sacrum right on right       Assessment and Plan:  Back pain Multifactorial but stable overall.  Does respond well to osteopathic manipulation.  I do feel though evaluation again every 6 to 8 weeks is relatively used.  Can use other over-the-counter medications.  Worsening pain can consider restarting such things as gabapentin which patient has discontinued.    Nonallopathic problems  Decision today to treat with OMT was based on Physical Exam  After verbal consent patient was treated with HVLA, ME, FPR techniques in cervical, rib, thoracic, lumbar, and sacral  areas  Patient tolerated the procedure well with improvement in symptoms  Patient given exercises, stretches and lifestyle modifications  See medications in patient instructions if given  Patient will follow up in 4-8 weeks    The above documentation has been reviewed and is accurate and complete [name], DO  Note: This dictation was prepared with Dragon dictation along with smaller phrase technology. Any transcriptional errors that result from this process are unintentional.

## 2022-06-30 ENCOUNTER — Encounter: Payer: Self-pay | Admitting: Family Medicine

## 2022-06-30 ENCOUNTER — Ambulatory Visit (INDEPENDENT_AMBULATORY_CARE_PROVIDER_SITE_OTHER): Payer: Medicare Other | Admitting: Family Medicine

## 2022-06-30 VITALS — BP 132/72 | HR 78 | Ht 66.0 in | Wt 194.0 lb

## 2022-06-30 DIAGNOSIS — M9902 Segmental and somatic dysfunction of thoracic region: Secondary | ICD-10-CM

## 2022-06-30 DIAGNOSIS — M9908 Segmental and somatic dysfunction of rib cage: Secondary | ICD-10-CM | POA: Diagnosis not present

## 2022-06-30 DIAGNOSIS — M9903 Segmental and somatic dysfunction of lumbar region: Secondary | ICD-10-CM | POA: Diagnosis not present

## 2022-06-30 DIAGNOSIS — I255 Ischemic cardiomyopathy: Secondary | ICD-10-CM

## 2022-06-30 DIAGNOSIS — M549 Dorsalgia, unspecified: Secondary | ICD-10-CM

## 2022-06-30 DIAGNOSIS — M9904 Segmental and somatic dysfunction of sacral region: Secondary | ICD-10-CM

## 2022-06-30 DIAGNOSIS — G8929 Other chronic pain: Secondary | ICD-10-CM

## 2022-06-30 DIAGNOSIS — M9901 Segmental and somatic dysfunction of cervical region: Secondary | ICD-10-CM

## 2022-06-30 NOTE — Assessment & Plan Note (Signed)
Multifactorial but stable overall.  Does respond well to osteopathic manipulation.  I do feel though evaluation again every 6 to 8 weeks is relatively used.  Can use other over-the-counter medications.  Worsening pain can consider restarting such things as gabapentin which patient has discontinued.

## 2022-06-30 NOTE — Patient Instructions (Signed)
Good to see you Get back into the swing of things See me in 7-8 weeks

## 2022-07-15 ENCOUNTER — Ambulatory Visit (INDEPENDENT_AMBULATORY_CARE_PROVIDER_SITE_OTHER): Payer: Medicare Other | Admitting: Gastroenterology

## 2022-07-15 ENCOUNTER — Encounter: Payer: Self-pay | Admitting: Gastroenterology

## 2022-07-15 VITALS — BP 102/58 | HR 90 | Ht 66.75 in | Wt 192.1 lb

## 2022-07-15 DIAGNOSIS — Z86010 Personal history of colonic polyps: Secondary | ICD-10-CM | POA: Diagnosis not present

## 2022-07-15 DIAGNOSIS — I255 Ischemic cardiomyopathy: Secondary | ICD-10-CM | POA: Diagnosis not present

## 2022-07-15 DIAGNOSIS — L290 Pruritus ani: Secondary | ICD-10-CM | POA: Diagnosis not present

## 2022-07-15 DIAGNOSIS — K219 Gastro-esophageal reflux disease without esophagitis: Secondary | ICD-10-CM

## 2022-07-15 MED ORDER — FAMOTIDINE 40 MG PO TABS: 40.0000 mg | ORAL_TABLET | Freq: Every day | ORAL | 3 refills | Status: AC

## 2022-07-15 NOTE — Patient Instructions (Addendum)
We have sent the following prescriptions to your mail in pharmacy: famotidine 40 mg daily.   If you have not heard from your mail in pharmacy within 1 week or if you have not received your medication in the mail, please contact us at [phone] so we may find out why.   You can use witch hazel and tucks pad at night for anal itching.   Decrease your anusol cream and take as needed.   Call our office if these regimens do not help.  The [facility] providers would like to encourage you to use [facility] to communicate with providers for non-urgent requests or questions.  Due to long hold times on the telephone, sending your provider a message by [facility] may be a faster and more efficient way to get a response.  Please allow 48 business hours for a response.  Please remember that this is for non-urgent requests.   Thank you for choosing me and [facility].  Pricilla Riffle. Dagoberto Ligas., MD., Marval Regal

## 2022-07-15 NOTE — Progress Notes (Signed)
    Assessment     GERD Anal itching Personal history of adenomatous colon polyps   Recommendations    Follow antireflux measures, continue Mylanta as needed and begin famotidine 40 mg daily.  Contact us if symptoms are not adequately controlled.  If symptoms are not very well controlled I recommended EGD for further evaluation. Begin Tucks pads or witch hazel twice daily as needed.  Reserve Anusol HC cream for refractory symptoms.  Contact us if symptoms are not adequately controlled. Surveillance colonoscopy recommended at 7 years, May 2026   HPI    This is a 66 year old male with GERD and persistent anal itching.  He relates epigastric and substernal burning pain occurring 3-4 times per week which is relieved with Mylanta.  He has made antireflux diet modifications with a substantial reduction in symptoms.  He was advised to begin omeprazole but when he got the prescription he was concerned about the side effect profile including possible B12 deficiency.  He also reports ongoing problems with anal itching which was evaluated at his June 08, 2020 office visit.  He reports that Anusol HC cream controls his symptoms but he needs to use it frequently.   Labs / Imaging       Latest Ref Rng & Units 02/06/2022    9:44 AM 08/07/2021    9:18 AM 02/05/2021    8:28 AM  Hepatic Function  Total Protein 6.0 - 8.3 g/dL 6.7  6.5  6.8   Albumin 3.5 - 5.2 g/dL 4.1  4.1  4.1   AST 0 - 37 U/L 27  50  23   ALT 0 - 53 U/L 30  82  22   Alk Phosphatase 39 - 117 U/L 74  87  77   Total Bilirubin 0.2 - 1.2 mg/dL 1.4  1.4  1.3        Latest Ref Rng & Units 01/15/2022    2:05 PM 08/07/2021    9:18 AM 01/19/2021    3:45 PM  CBC  WBC 4.0 - 10.5 K/uL 7.3  6.8  6.9   Hemoglobin 13.0 - 17.0 g/dL 14.6  14.8  14.0   Hematocrit 39.0 - 52.0 % 42.1  43.0  39.8   Platelets 150 - 400 K/uL 218  211.0  214     Current Medications, Allergies, Past Medical History, Past Surgical History, Family History and  Social History were reviewed in [employer] record.   Physical Exam: General: Well developed, well nourished, no acute distress Head: Normocephalic and atraumatic Eyes: Sclerae anicteric, EOMI Ears: Normal auditory acuity Mouth: Not examined Lungs: Clear throughout to auscultation Heart: Regular rate and rhythm; no murmurs, rubs or bruits Abdomen: Soft, non tender and non distended. No masses, hepatosplenomegaly or hernias noted. Normal Bowel sounds Rectal: Not done Musculoskeletal: Symmetrical with no gross deformities  Pulses:  Normal pulses noted Extremities: No clubbing, cyanosis, edema or deformities noted Neurological: Alert oriented x 4, grossly nonfocal Psychological:  Alert and cooperative. Normal mood and affect   Dalesha Stanback T. Fuller Plan, MD 07/15/2022, 11:23 AM

## 2022-08-08 ENCOUNTER — Ambulatory Visit: Payer: Medicare Other | Admitting: Family Medicine

## 2022-08-26 ENCOUNTER — Other Ambulatory Visit: Payer: Self-pay | Admitting: Cardiology

## 2022-08-27 NOTE — Progress Notes (Signed)
[place] Phone: [phone] Subjective:    I'm seeing this patient by the request  of:  [name], MD  CC: Low back pain follow-up  [id number]  Michael [name] is a 66 y.o. male coming in with complaint of back and neck pain. OMT 06/30/2022. Patient states that he is having left low back pain that is bothering him. States his knee is still bothering him so he is compensating because of the pain and nothing he is doing is helping that issue.   Medications patient has been prescribed: None  Taking:         Reviewed prior external information including notes and imaging from previsou exam, outside providers and external EMR if available.   As well as notes that were available from care everywhere and other healthcare systems.  Past medical history, social, surgical and family history all reviewed in electronic medical record.  No pertanent information unless stated regarding to the chief complaint.   Past Medical History:  Diagnosis Date   Acute lateral meniscal tear, right, initial encounter 09/26/2021   Injection given September 26, 2018.   CAD S/P PCI to Cx & RCA    a. s/p PCI of LCx (2006) and ostium of RCA (2007) as well as DES to RCA in [place] (05/2014)   HTN (hypertension)    Hyperlipidemia    Plantar fasciitis 10/12/2017    No Known Allergies   Review of Systems:  No headache, visual changes, nausea, vomiting, diarrhea, constipation, dizziness, abdominal pain, skin rash, fevers, chills, night sweats, weight loss, swollen lymph nodes, body aches, joint swelling, chest pain, shortness of breath, mood changes. POSITIVE muscle aches  Objective  Blood pressure 118/80, pulse 80, height 5' 6.75" (1.695 m), weight 193 lb (87.5 kg), SpO2 98 %.   General: No apparent distress alert and oriented x3 mood and affect normal, dressed appropriately.  HEENT: Pupils equal, extraocular movements  intact  Respiratory: Patient's speak in full sentences and does not appear short of breath  Cardiovascular: No lower extremity edema, non tender, no erythema  Low back exam does have some loss of lordosis.  Patient does have worsening pain now in the sacroiliac joints left greater than right.  Significant tightness noted with FABER test on the left side as well.  Difficulty with any type of extension of the back.  After verbal consent patient was prepped with alcohol swabs and with a 21-gauge 2 inch needle injected into the left sacroiliac joint with a total of 0.5 cc of 0.5% Marcaine and 0.5 cc of Kenalog 40 mg/mL.  No blood loss.  Postinjection instructions given.  Osteopathic findings  C5 flexed rotated and side bent left T3 extended rotated and side bent right inhaled rib T5 extended rotated and side bent left L2 flexed rotated and side bent right L5 flexed rotated and side bent left Sacrum left on left       Assessment and Plan:  SI (sacroiliac) joint dysfunction New problem.  Worsening pain noted in usual.  Affecting daily activities as well as waking up patient at night.  Discussed with patient about posture and ergonomics.  Continue to work on core strength.  Has responded well to the injections previously well.  Follow-up with me again in 6 to 8 weeks    Nonallopathic problems  Decision today to treat with OMT was based on Physical Exam  After verbal consent patient was treated with HVLA, ME,  FPR techniques in cervical, rib, thoracic, lumbar, and sacral  areas  Patient tolerated the procedure well with improvement in symptoms  Patient given exercises, stretches and lifestyle modifications  See medications in patient instructions if given  Patient will follow up in 4-8 weeks    The above documentation has been reviewed and is accurate and complete [name], DO          Note: This dictation was prepared with Dragon dictation along with smaller phrase  technology. Any transcriptional errors that result from this process are unintentional.

## 2022-09-02 ENCOUNTER — Ambulatory Visit (INDEPENDENT_AMBULATORY_CARE_PROVIDER_SITE_OTHER): Payer: Medicare Other | Admitting: Family Medicine

## 2022-09-02 VITALS — BP 118/80 | HR 80 | Ht 66.75 in | Wt 193.0 lb

## 2022-09-02 DIAGNOSIS — M9902 Segmental and somatic dysfunction of thoracic region: Secondary | ICD-10-CM

## 2022-09-02 DIAGNOSIS — M533 Sacrococcygeal disorders, not elsewhere classified: Secondary | ICD-10-CM | POA: Insufficient documentation

## 2022-09-02 DIAGNOSIS — M9904 Segmental and somatic dysfunction of sacral region: Secondary | ICD-10-CM

## 2022-09-02 DIAGNOSIS — M9901 Segmental and somatic dysfunction of cervical region: Secondary | ICD-10-CM

## 2022-09-02 DIAGNOSIS — M9908 Segmental and somatic dysfunction of rib cage: Secondary | ICD-10-CM

## 2022-09-02 DIAGNOSIS — I255 Ischemic cardiomyopathy: Secondary | ICD-10-CM | POA: Diagnosis not present

## 2022-09-02 DIAGNOSIS — M5010 Cervical disc disorder with radiculopathy, unspecified cervical region: Secondary | ICD-10-CM

## 2022-09-02 DIAGNOSIS — M9903 Segmental and somatic dysfunction of lumbar region: Secondary | ICD-10-CM

## 2022-09-02 NOTE — Patient Instructions (Signed)
Good to see you Injection given  PT referral placed to [facility]  Follow up in 4-5 weeks

## 2022-09-02 NOTE — Assessment & Plan Note (Addendum)
New problem.  Worsening pain noted in usual.  Affecting daily activities as well as waking up patient at night.  Discussed with patient about posture and ergonomics.  Continue to work on core strength.  Has responded well to the injections previously well.  Follow-up with me again in 6 to 8 weeks patient is also responded extremely well to physical therapy but will referred patient according.

## 2022-09-03 NOTE — Therapy (Unsigned)
OUTPATIENT PHYSICAL THERAPY THORACOLUMBAR EVALUATION   Patient Name: Michael [name] MRN: [id number] DOB:09/26/1955, 66 y.o., male Today's Date: 09/04/2022  END OF SESSION:  PT End of Session - 09/04/22 0800     Visit Number 1    Number of Visits 12    Date for PT Re-Evaluation 10/30/22    Authorization Type medicare    PT Start Time 0802    PT Stop Time 4332    PT Time Calculation (min) 33 min             Past Medical History:  Diagnosis Date   Acute lateral meniscal tear, right, initial encounter 09/26/2021   Injection given September 26, 2018.   CAD S/P PCI to Cx & RCA    a. s/p PCI of LCx (2006) and ostium of RCA (2007) as well as DES to RCA in [place] (05/2014)   HTN (hypertension)    Hyperlipidemia    Plantar fasciitis 10/12/2017   Past Surgical History:  Procedure Laterality Date   CARDIAC CATHETERIZATION N/A 05/06/2016   Procedure: Left Heart Cath and Coronary Angiography;  Surgeon: Nelva Bush, MD;  Location: [facility];  Service: Cardiovascular;  Laterality: N/A;   CARDIAC CATHETERIZATION N/A 05/06/2016   Procedure: Coronary Stent Intervention;  Surgeon: Nelva Bush, MD;  Location: [facility];  Service: Cardiovascular;  Laterality: N/A;   CARDIAC CATHETERIZATION N/A 05/13/2016   Procedure: Coronary/Graft Angiography;  Surgeon: Peter M Martinique, MD;  Location: [facility];  Service: Cardiovascular;  Laterality: N/A;   CORONARY ANGIOPLASTY WITH STENT PLACEMENT  2006   in [place], occluded CFX, s/p Taxus stent   CORONARY ANGIOPLASTY WITH STENT PLACEMENT  2007   in [place], ostial RCA stent   CORONARY ANGIOPLASTY WITH STENT PLACEMENT  2015   in [place], RCA stent   [place]      Patient Active Problem List   Diagnosis Date Noted   SI (sacroiliac) joint dysfunction 09/02/2022   Gluteal tendinitis of right buttock 03/04/2022   Coronary artery disease with stable angina pectoris, unspecified  vessel or lesion type, unspecified whether native or transplanted heart ([place]) 02/06/2022   Degenerative arthritis of right knee 11/15/2021   Cervical disc disorder with radiculopathy of cervical region 03/15/2021   Left shoulder pain 01/24/2021   AC (acromioclavicular) arthritis 01/24/2021   Abnormal chest x-ray 01/24/2021   Right carpal tunnel syndrome 09/05/2020   Perianal irritation 06/08/2020   Ischemic cardiomyopathy 12/05/2019   History of adenomatous polyp of colon 06/07/2018   BPH (benign prostatic hyperplasia) 06/07/2018   Vitiligo 06/07/2018   Gluteal pain 04/10/2018   Back pain 04/13/2017   Nonallopathic lesion of thoracic region 04/13/2017   Nonallopathic lesion of sacral region 04/13/2017   Nonallopathic lesion of lumbosacral region 04/13/2017   Impaired glucose tolerance 11/03/2016   Obesity    HTN (hypertension)    CAD S/P multiple PCIs 09/08/2014   Dyslipidemia 09/08/2014    PCP: [name], MD  REFERRING PROVIDER: [name], DO  REFERRING DIAG: M99.01 (ICD-10-CM) - Somatic dysfunction of spine, cervical M50.10 (ICD-10-CM) - Cervical disc disorder with radiculopathy of cervical region M99.03 (ICD-10-CM) - Somatic dysfunction of lumbar region  Rationale for Evaluation and Treatment: Rehabilitation  THERAPY DIAG:  Muscle weakness (generalized) - Plan: PT plan of care cert/re-cert  Other low back pain - Plan: PT plan of care cert/re-cert  ONSET DATE: 2-3 months  SUBJECTIVE:  SUBJECTIVE STATEMENT: States that he has been having some low back pain on the left side. States he was swimming and exercises to help with it but it didn't seem to help it. States he feels he was over compensating with his right knee (has a torn meniscus) and he thinks that that caused his  back pain. States he has be bringing his knee to his chest to stretch it but can't seem to get it. Reports he recently got an injection in his back on the left side, first injection in his back on the left side. States the pain was radiating around hip on the left side but not since ht injection. States transitional movements also bothered him (STS)  PERTINENT HISTORY:  History of cardiac issues x5 stents, low back pain and neck pain, torn right meniscus getting gel shots   PAIN:  Are you having pain? Yes: NPRS scale: 2/10 Pain location: left mid back Pain description: sore Aggravating factors: walking Relieving factors: resting, stretches, injection  PRECAUTIONS: None  WEIGHT BEARING RESTRICTIONS: No  FALLS:  Has patient fallen in last 6 months? No    PLOF: Independent  PATIENT GOALS: to have less pain with walking   OBJECTIVE:   SCREENING FOR RED FLAGS: Bowel or bladder incontinence: No Spinal tumors: No Cauda equina syndrome: No Compression fracture: No Abdominal aneurysm: No  COGNITION: Overall cognitive status: Within functional limits for tasks assessed     SENSATION: Not tested  MUSCLE LENGTH: Hamstrings: Right 30 deg; Left 20 deg  POSTURE: rounded shoulders, forward head, and flexed trunk   PALPATION: Tenderness to palpation along lumbar paraspinals and increased resting tone noted bilaterally in lumbar paraspinals  LUMBAR ROM:   AROM eval  Flexion 25% limited * pain with movement  Extension 90% limited *  Right lateral flexion 75% limited*  Left lateral flexion 50% limited  Right rotation   Left rotation    (Blank rows = not tested)    LE Measurements Lower Extremity Right 09/04/2022 Left 09/04/2022   A/PROM MMT A/PROM MMT  Hip Flexion 100  105   Hip Extension  4-  4-  Hip Abduction      Hip Adduction      Hip Internal rotation 20  30   Hip External rotation 65  55   Knee Flexion      Knee Extension      Ankle Dorsiflexion       Ankle Plantarflexion      Ankle Inversion      Ankle Eversion       (Blank rows = not tested) * pain   LUMBAR SPECIAL TESTS:    Slump test neg B    SLR test neg B   FABER neg B FUNCTIONAL TESTS:  Transitional movements bed mobilities and STS painful    TODAY'S TREATMENT:  DATE:   09/04/2022  Therapeutic Exercise:  Aerobic: Supine: hip IR B x15 5" holds B Prone: laying on stomach 5 minutes, prone on elbows up down- reduced glute activation x20 5" holds, hamstring curls x5 5" holds   Seated:  Standing: Neuromuscular Re-education: Manual Therapy: Therapeutic Activity: Self Care: Trigger Point Dry Needling:  Modalities:    PATIENT EDUCATION:  Education details: on current presentation, on HEP, on clinical outcomes score and POC, on breathing with movement, log roll and how to  Person educated: Patient Education method: Consulting civil engineer, Demonstration, and Handouts Education comprehension: verbalized understanding   HOME EXERCISE PROGRAM: [id number]  ASSESSMENT:  CLINICAL IMPRESSION: Patient presents with low back pain that is occurred for the last couple of months and primarily worse with walking.  Patient with limited hip and lumbar motion and compensatory lumbar rotation with hip motions causing low back pain.  Patient presents with weakness, pain, limited mobility and would greatly benefit from skilled physical therapy to develop appropriate home exercise program to reduce pain and improve overall quality of life.  OBJECTIVE IMPAIRMENTS: decreased activity tolerance, difficulty walking, decreased ROM, decreased strength, postural dysfunction, and pain.   ACTIVITY LIMITATIONS: transfers, bed mobility, and locomotion level  PARTICIPATION LIMITATIONS: community activity  PERSONAL FACTORS: Age and 1-2 comorbidities: CAD, neck pain, right knee pain   are also affecting patient's functional outcome.   REHAB POTENTIAL: Good  CLINICAL DECISION MAKING: Stable/uncomplicated  EVALUATION COMPLEXITY: Low   GOALS: Goals reviewed with patient? yes  SHORT TERM GOALS: Target date: 10/02/2022  Patient will be independent in self management strategies to improve quality of life and functional outcomes. Baseline: New Program Goal status: INITIAL  2.  Patient will report at least 50% improvement in overall symptoms and/or function to demonstrate improved functional mobility Baseline: 0% better Goal status: INITIAL  3.  Patient will be able to walk up to 30 minutes without pain in low back Baseline: Unable Goal status: INITIAL     LONG TERM GOALS: Target date: 10/30/2022   Patient will report at least 75% improvement in overall symptoms and/or function to demonstrate improved functional mobility Baseline: 0% better Goal status: INITIAL  2.  Patient will be able to perform transitional movements like sit to stand and bed mobility is without pain to improve overall quality of life Baseline: Painful Goal status: INITIAL  3.  Patient will report lying on stomach at least 5 times a week to improve lumbar extension  Baseline 0 times a week Goal status: INITIAL  4.  Patient will demonstrate pain-free lumbar range of motion in all directions Baseline: Painful Goal status: INITIAL   PLAN:  PT FREQUENCY: 1-2x/week for max total of 12 visits  PT DURATION: 8 weeks  PLANNED INTERVENTIONS: Therapeutic exercises, Therapeutic activity, Neuromuscular re-education, Balance training, Gait training, Patient/Family education, Self Care, Joint mobilization, Joint manipulation, Vestibular training, Orthotic/Fit training, DME instructions, Aquatic Therapy, Dry Needling, Electrical stimulation, Spinal manipulation, Spinal mobilization, Cryotherapy, Moist heat, Taping, Ultrasound, Ionotophoresis '4mg'$ /ml Dexamethasone, Manual therapy, and  Re-evaluation.  PLAN FOR NEXT SESSION: hip IR, lumbar ROM, core strength, prone exercises-current plan 3-4 visits pending patient presentation   8:43 AM, 09/04/22 Jerene Pitch, DPT Physical Therapy with [facility]

## 2022-09-04 ENCOUNTER — Ambulatory Visit (INDEPENDENT_AMBULATORY_CARE_PROVIDER_SITE_OTHER): Payer: Medicare Other | Admitting: Physical Therapy

## 2022-09-04 ENCOUNTER — Encounter: Payer: Self-pay | Admitting: *Deleted

## 2022-09-04 ENCOUNTER — Encounter: Payer: Self-pay | Admitting: Physical Therapy

## 2022-09-04 DIAGNOSIS — M5459 Other low back pain: Secondary | ICD-10-CM

## 2022-09-04 DIAGNOSIS — M6281 Muscle weakness (generalized): Secondary | ICD-10-CM | POA: Diagnosis not present

## 2022-09-10 ENCOUNTER — Encounter: Payer: Medicare Other | Admitting: Physical Therapy

## 2022-09-17 ENCOUNTER — Encounter: Payer: Medicare Other | Admitting: Physical Therapy

## 2022-09-20 ENCOUNTER — Other Ambulatory Visit: Payer: Self-pay | Admitting: Family Medicine

## 2022-09-24 ENCOUNTER — Encounter: Payer: Self-pay | Admitting: Physical Therapy

## 2022-09-24 ENCOUNTER — Ambulatory Visit (INDEPENDENT_AMBULATORY_CARE_PROVIDER_SITE_OTHER): Payer: Medicare Other | Admitting: Physical Therapy

## 2022-09-24 DIAGNOSIS — M6281 Muscle weakness (generalized): Secondary | ICD-10-CM

## 2022-09-24 DIAGNOSIS — M5459 Other low back pain: Secondary | ICD-10-CM | POA: Diagnosis not present

## 2022-09-24 NOTE — Therapy (Signed)
OUTPATIENT PHYSICAL THERAPY TREATMENT NOTE PHYSICAL THERAPY DISCHARGE SUMMARY  Visits from Start of Care: 2  Current functional level related to goals / functional outcomes: See below    Remaining deficits: none   Education / Equipment: See below   Patient agrees to discharge. Patient goals were partially met. Patient is being discharged due to being pleased with the current functional level.   Patient Name: Michael [name] MRN: [id number] DOB:Mar 19, 1956, 67 y.o., male Today's Date: 09/24/2022  PCP: [name], MD   REFERRING PROVIDER: [name], DO  END OF SESSION:   PT End of Session - 09/24/22 0847     Visit Number 2    Number of Visits 12    Date for PT Re-Evaluation 10/30/22    Authorization Type medicare    PT Start Time 0851    PT Stop Time 0916    PT Time Calculation (min) 25 min             Past Medical History:  Diagnosis Date   Acute lateral meniscal tear, right, initial encounter 09/26/2021   Injection given September 26, 2018.   CAD S/P PCI to Cx & RCA    a. s/p PCI of LCx (2006) and ostium of RCA (2007) as well as DES to RCA in [place] (05/2014)   HTN (hypertension)    Hyperlipidemia    Plantar fasciitis 10/12/2017   Past Surgical History:  Procedure Laterality Date   CARDIAC CATHETERIZATION N/A 05/06/2016   Procedure: Left Heart Cath and Coronary Angiography;  Surgeon: Nelva Bush, MD;  Location: [facility];  Service: Cardiovascular;  Laterality: N/A;   CARDIAC CATHETERIZATION N/A 05/06/2016   Procedure: Coronary Stent Intervention;  Surgeon: Nelva Bush, MD;  Location: [facility];  Service: Cardiovascular;  Laterality: N/A;   CARDIAC CATHETERIZATION N/A 05/13/2016   Procedure: Coronary/Graft Angiography;  Surgeon: Peter M Martinique, MD;  Location: [facility];  Service: Cardiovascular;  Laterality: N/A;   CORONARY ANGIOPLASTY WITH STENT PLACEMENT  2006   in [place], occluded CFX, s/p Taxus stent    CORONARY ANGIOPLASTY WITH STENT PLACEMENT  2007   in [place], ostial RCA stent   CORONARY ANGIOPLASTY WITH STENT PLACEMENT  2015   in [place], RCA stent   [place]      Patient Active Problem List   Diagnosis Date Noted   SI (sacroiliac) joint dysfunction 09/02/2022   Gluteal tendinitis of right buttock 03/04/2022   Coronary artery disease with stable angina pectoris, unspecified vessel or lesion type, unspecified whether native or transplanted heart ([place]) 02/06/2022   Degenerative arthritis of right knee 11/15/2021   Cervical disc disorder with radiculopathy of cervical region 03/15/2021   Left shoulder pain 01/24/2021   AC (acromioclavicular) arthritis 01/24/2021   Abnormal chest x-ray 01/24/2021   Right carpal tunnel syndrome 09/05/2020   Perianal irritation 06/08/2020   Ischemic cardiomyopathy 12/05/2019   History of adenomatous polyp of colon 06/07/2018   BPH (benign prostatic hyperplasia) 06/07/2018   Vitiligo 06/07/2018   Gluteal pain 04/10/2018   Back pain 04/13/2017   Nonallopathic lesion of thoracic region 04/13/2017   Nonallopathic lesion of sacral region 04/13/2017   Nonallopathic lesion of lumbosacral region 04/13/2017   Impaired glucose tolerance 11/03/2016   Obesity    HTN (hypertension)    CAD S/P multiple PCIs 09/08/2014   Dyslipidemia 09/08/2014     THERAPY DIAG:  Muscle weakness (generalized)  Other low back pain    REFERRING DIAG: M99.01 (  ICD-10-CM) - Somatic dysfunction of spine, cervical M50.10 (ICD-10-CM) - Cervical disc disorder with radiculopathy of cervical region M99.03 (ICD-10-CM) - Somatic dysfunction of lumbar region   Rationale for Evaluation and Treatment: Rehabilitation     ONSET DATE: 2-3 months   SUBJECTIVE:                                                                                                                                                                                             SUBJECTIVE STATEMENT: 09/24/2022 States that he has been feeling good and he only had one day of pain since last session. States he did not do his exercises as much as he should as it was a busy holiday. 95% better .   Eval: States that he has been having some low back pain on the left side. States he was swimming and exercises to help with it but it didn't seem to help it. States he feels he was over compensating with his right knee (has a torn meniscus) and he thinks that that caused his back pain. States he has be bringing his knee to his chest to stretch it but can't seem to get it. Reports he recently got an injection in his back on the left side, first injection in his back on the left side. States the pain was radiating around hip on the left side but not since ht injection. States transitional movements also bothered him (STS)   PERTINENT HISTORY:  History of cardiac issues x5 stents, low back pain and neck pain, torn right meniscus getting gel shots    PAIN:  Are you having pain? Yes: NPRS scale: 2/10 Pain location: left mid back Pain description: sore Aggravating factors: walking Relieving factors: resting, stretches, injection   PRECAUTIONS: None   WEIGHT BEARING RESTRICTIONS: No   FALLS:  Has patient fallen in last 6 months? No       PLOF: Independent   PATIENT GOALS: to have less pain with walking     OBJECTIVE:     SCREENING FOR RED FLAGS: Bowel or bladder incontinence: No Spinal tumors: No Cauda equina syndrome: No Compression fracture: No Abdominal aneurysm: No   COGNITION: Overall cognitive status: Within functional limits for tasks assessed                          SENSATION: Not tested   MUSCLE LENGTH: Hamstrings: Right 30 deg; Left 20 deg   POSTURE: rounded shoulders, forward head, and flexed trunk    PALPATION: Tenderness to palpation along lumbar paraspinals and increased resting tone noted bilaterally in lumbar paraspinals  LUMBAR ROM:     AROM 09/24/22  Flexion 25% limited   Extension 75% limited   Right lateral flexion 75% limited  Left lateral flexion 50% limited  Right rotation    Left rotation     (Blank rows = not tested)       LE Measurements       Lower Extremity Right 09/04/2022 Left 09/04/2022    A/PROM MMT A/PROM MMT  Hip Flexion 100   105    Hip Extension   4-   4-  Hip Abduction          Hip Adduction          Hip Internal rotation 20   30    Hip External rotation 65   55    Knee Flexion          Knee Extension          Ankle Dorsiflexion          Ankle Plantarflexion          Ankle Inversion          Ankle Eversion           (Blank rows = not tested) * pain     LUMBAR SPECIAL TESTS:                       Slump test neg B                       SLR test neg B                      FABER neg B FUNCTIONAL TESTS:  Transitional movements bed mobilities and STS painful       TODAY'S TREATMENT:                                                                                                                              DATE:   09/24/2022   Therapeutic Exercise:    Aerobic: Supine: hip IR B x15 5" holds B, LTR 2 minutes B, SKC x5 15" holds B, bent knee fall outs 2 minutes Prone: laying on stomach 5 minutes, prone on elbows up down- reduced glute activation x20 5" holds, hamstring curls x5 5" holds     Seated: lumbar flexion x10 10" holds     Standing: Neuromuscular Re-education: Manual Therapy: Therapeutic Activity: Self Care: Trigger Point Dry Needling:  Modalities:      PATIENT EDUCATION:  Education details: on HEP, on importance of continued adherence to HEP Person educated: Patient Education method: Explanation, Demonstration, and Handouts Education comprehension: verbalized understanding     HOME EXERCISE PROGRAM: [id number]   ASSESSMENT:   CLINICAL IMPRESSION: 09/24/2022 Session focused on review of HEP and progression of exercises. 95% report improvement since last session  with patient desire to discontinue PT at this time. Printed off new exercises and  answered all questions. Patient to DC to HEP at this time secondary to progress and patient request.   Eval: Patient presents with low back pain that is occurred for the last couple of months and primarily worse with walking.  Patient with limited hip and lumbar motion and compensatory lumbar rotation with hip motions causing low back pain.  Patient presents with weakness, pain, limited mobility and would greatly benefit from skilled physical therapy to develop appropriate home exercise program to reduce pain and improve overall quality of life.   OBJECTIVE IMPAIRMENTS: decreased activity tolerance, difficulty walking, decreased ROM, decreased strength, postural dysfunction, and pain.    ACTIVITY LIMITATIONS: transfers, bed mobility, and locomotion level   PARTICIPATION LIMITATIONS: community activity   PERSONAL FACTORS: Age and 1-2 comorbidities: CAD, neck pain, right knee pain  are also affecting patient's functional outcome.    REHAB POTENTIAL: Good   CLINICAL DECISION MAKING: Stable/uncomplicated   EVALUATION COMPLEXITY: Low     GOALS: Goals reviewed with patient? yes   SHORT TERM GOALS: Target date: 10/02/2022  Patient will be independent in self management strategies to improve quality of life and functional outcomes. Baseline: New Program Goal status: PROGRESSING   2.  Patient will report at least 50% improvement in overall symptoms and/or function to demonstrate improved functional mobility Baseline: 0% better Goal status: MET   3.  Patient will be able to walk up to 30 minutes without pain in low back Baseline: Unable Goal status: MET         LONG TERM GOALS: Target date: 10/30/2022    Patient will report at least 75% improvement in overall symptoms and/or function to demonstrate improved functional mobility Baseline: 0% better Goal status: MET   2.  Patient will be able to perform  transitional movements like sit to stand and bed mobility is without pain to improve overall quality of life Baseline: Painful Goal status: MET   3.  Patient will report lying on stomach at least 5 times a week to improve lumbar extension  Baseline 0 times a week Goal status: PROGRESSING   4.  Patient will demonstrate pain-free lumbar range of motion in all directions Baseline: Painful Goal status: MET      PLAN:   PT FREQUENCY: 1-2x/week for max total of 12 visits   PT DURATION: 8 weeks   PLANNED INTERVENTIONS: Therapeutic exercises, Therapeutic activity, Neuromuscular re-education, Balance training, Gait training, Patient/Family education, Self Care, Joint mobilization, Joint manipulation, Vestibular training, Orthotic/Fit training, DME instructions, Aquatic Therapy, Dry Needling, Electrical stimulation, Spinal manipulation, Spinal mobilization, Cryotherapy, Moist heat, Taping, Ultrasound, Ionotophoresis 40m/ml Dexamethasone, Manual therapy, and Re-evaluation.   PLAN FOR NEXT SESSION:DC to HEP   9:19 AM, 09/24/22 MJerene Pitch DPT Physical Therapy with [facility]

## 2022-09-25 NOTE — Progress Notes (Signed)
[place] Phone: [phone] Subjective:   [name], am serving as a scribe for Dr. Hulan Saas.  I'm seeing this patient by the request  of:  [name], MD  CC: Neck and back pain follow-up  ASN:[id number]  Zakaria Sedor is a 67 y.o. male coming in with complaint of back and neck pain. OMT 09/02/2022. Patient states that he is doing much better than last visit. No longer having pain in L side of lumbar spine.   Medications patient has been prescribed: Vit D taking once monthly  Taking:         Reviewed prior external information including notes and imaging from previsou exam, outside providers and external EMR if available.   As well as notes that were available from care everywhere and other healthcare systems.  Past medical history, social, surgical and family history all reviewed in electronic medical record.  No pertanent information unless stated regarding to the chief complaint.   Past Medical History:  Diagnosis Date   Acute lateral meniscal tear, right, initial encounter 09/26/2021   Injection given September 26, 2018.   CAD S/P PCI to Cx & RCA    a. s/p PCI of LCx (2006) and ostium of RCA (2007) as well as DES to RCA in [place] (05/2014)   HTN (hypertension)    Hyperlipidemia    Plantar fasciitis 10/12/2017    No Known Allergies   Review of Systems:  No headache, visual changes, nausea, vomiting, diarrhea, constipation, dizziness, abdominal pain, skin rash, fevers, chills, night sweats, weight loss, swollen lymph nodes, body aches, joint swelling, chest pain, shortness of breath, mood changes. POSITIVE muscle aches  Objective  Blood pressure 110/72, pulse 81, height 5' 6.75" (1.695 m), weight 198 lb (89.8 kg), SpO2 99 %.   General: No apparent distress alert and oriented x3 mood and affect normal, dressed appropriately.  HEENT: Pupils equal, extraocular movements  intact  Respiratory: Patient's speak in full sentences and does not appear short of breath  Cardiovascular: No lower extremity edema, non tender, no erythema  Lower back does have some loss lordosis noted.  Some tenderness to palpation in the paraspinal musculature.  Patient does have more tightness in the thoracolumbar junction than usual.  Osteopathic findings  C2 flexed rotated and side bent right C7 flexed rotated and side bent left T3 extended rotated and side bent right inhaled rib L2 flexed rotated and side bent right L3 flexed rotated and side bent left Sacrum right on right       Assessment and Plan:  SI (sacroiliac) joint dysfunction Pain seems to be more in the thoracolumbar junction noted today.  Discussed with patient about icing regimen and home exercises, which activities to do and which ones to avoid.  Increase activity.  Discussed core strengthening and stretching hip flexors after activity.  Follow-up with me again in 6 to 8 weeks    Nonallopathic problems  Decision today to treat with OMT was based on Physical Exam  After verbal consent patient was treated with HVLA, ME, FPR techniques in cervical, rib, thoracic, lumbar, and sacral  areas  Patient tolerated the procedure well with improvement in symptoms  Patient given exercises, stretches and lifestyle modifications  See medications in patient instructions if given  Patient will follow up in 4-8 weeks    The above documentation has been reviewed and is accurate and complete [name], DO  Note: This dictation was prepared with Dragon dictation along with smaller phrase technology. Any transcriptional errors that result from this process are unintentional.

## 2022-10-01 ENCOUNTER — Encounter: Payer: Medicare Other | Admitting: Physical Therapy

## 2022-10-02 ENCOUNTER — Ambulatory Visit (INDEPENDENT_AMBULATORY_CARE_PROVIDER_SITE_OTHER): Payer: Medicare Other | Admitting: Family Medicine

## 2022-10-02 VITALS — BP 110/72 | HR 81 | Ht 66.75 in | Wt 198.0 lb

## 2022-10-02 DIAGNOSIS — M9901 Segmental and somatic dysfunction of cervical region: Secondary | ICD-10-CM

## 2022-10-02 DIAGNOSIS — M9903 Segmental and somatic dysfunction of lumbar region: Secondary | ICD-10-CM

## 2022-10-02 DIAGNOSIS — M9902 Segmental and somatic dysfunction of thoracic region: Secondary | ICD-10-CM

## 2022-10-02 DIAGNOSIS — M9908 Segmental and somatic dysfunction of rib cage: Secondary | ICD-10-CM | POA: Diagnosis not present

## 2022-10-02 DIAGNOSIS — M533 Sacrococcygeal disorders, not elsewhere classified: Secondary | ICD-10-CM | POA: Diagnosis not present

## 2022-10-02 DIAGNOSIS — M9904 Segmental and somatic dysfunction of sacral region: Secondary | ICD-10-CM | POA: Diagnosis not present

## 2022-10-02 NOTE — Patient Instructions (Addendum)
Should there you go again stretch hip flexors See me in 6-8 weeks

## 2022-10-02 NOTE — Assessment & Plan Note (Signed)
Pain seems to be more in the thoracolumbar junction noted today.  Discussed with patient about icing regimen and home exercises, which activities to do and which ones to avoid.  Increase activity.  Discussed core strengthening and stretching hip flexors after activity.  Follow-up with me again in 6 to 8 weeks

## 2022-10-07 ENCOUNTER — Ambulatory Visit: Payer: Medicare Other

## 2022-11-12 NOTE — Progress Notes (Unsigned)
[place] [address] [place] Phone: [phone] Subjective:   [name], am serving as a scribe for Dr. Hulan Saas.  I'm seeing this patient by the request  of:  [name], MD  CC: back and neck pain   [id number]  Michael [name] is a 67 y.o. male coming in with complaint of back and neck pain. OMT 10/02/2022. Patient states that he feels the same as last visit but slightly better since going to the gym. Tenderness in L lumbar spine.   Also mentions anterior R shoulder pain with [employer] press. Pain is minor at this time.   Also walking more and his R knee is now stiff in the mornings.   Medications patient has been prescribed: Vit D  Taking:         Reviewed prior external information including notes and imaging from previsou exam, outside providers and external EMR if available.   As well as notes that were available from care everywhere and other healthcare systems.  Past medical history, social, surgical and family history all reviewed in electronic medical record.  No pertanent information unless stated regarding to the chief complaint.   Past Medical History:  Diagnosis Date   Acute lateral meniscal tear, right, initial encounter 09/26/2021   Injection given September 26, 2018.   CAD S/P PCI to Cx & RCA    a. s/p PCI of LCx (2006) and ostium of RCA (2007) as well as DES to RCA in [place] (05/2014)   HTN (hypertension)    Hyperlipidemia    Plantar fasciitis 10/12/2017    No Known Allergies   Review of Systems:  No headache, visual changes, nausea, vomiting, diarrhea, constipation, dizziness, abdominal pain, skin rash, fevers, chills, night sweats, weight loss, swollen lymph nodes, body aches, joint swelling, chest pain, shortness of breath, mood changes. POSITIVE muscle aches  Objective  Blood pressure 124/64, pulse 83, height 5' 6"$  (1.676 m), weight 193 lb (87.5 kg), SpO2 97 %.   General:  No apparent distress alert and oriented x3 mood and affect normal, dressed appropriately.  HEENT: Pupils equal, extraocular movements intact  Respiratory: Patient's speak in full sentences and does not appear short of breath  Cardiovascular: No lower extremity edema, non tender, no erythema  Neck mild loss of lordosis patient does have limited sidebending.   Osteopathic findings  C2 flexed rotated and side bent right C6 flexed rotated and side bent left T3 extended rotated and side bent right inhaled rib T8 extended rotated and side bent left L3 flexed rotated and side bent right L5 F RS left  Sacrum right on right       Assessment and Plan:  Cervical disc disorder with radiculopathy of cervical region Loss of lordosis focused on more muscle energy.  Discussed again about the strengthening of the upper back but keeping hands within peripheral vision.  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  Continue work on core strength.  Still has some difficulty with flexion of the back.  Patient will continue to work on home exercises and icing regimen.  Will monitor for any repetitive extension that causes pain follow-up again in 6 to 8 weeks this is a follow-up     Nonallopathic problems  Decision today to treat with OMT was based on Physical Exam  After verbal consent patient was treated with HVLA,  ME, FPR techniques in cervical, rib, thoracic, lumbar, and sacral  areas muscle energy on the neck.  Patient tolerated the procedure well with improvement in symptoms  Patient given exercises, stretches and lifestyle modifications  See medications in patient instructions if given  Patient will follow up in 4-8 weeks    The above documentation has been reviewed and is accurate and complete [name], DO          Note: This dictation was prepared with Dragon dictation along with  smaller phrase technology. Any transcriptional errors that result from this process are unintentional.

## 2022-11-13 ENCOUNTER — Ambulatory Visit (INDEPENDENT_AMBULATORY_CARE_PROVIDER_SITE_OTHER): Payer: Medicare Other | Admitting: Family Medicine

## 2022-11-13 VITALS — BP 124/64 | HR 83 | Ht 66.0 in | Wt 193.0 lb

## 2022-11-13 DIAGNOSIS — M9903 Segmental and somatic dysfunction of lumbar region: Secondary | ICD-10-CM | POA: Diagnosis not present

## 2022-11-13 DIAGNOSIS — M9904 Segmental and somatic dysfunction of sacral region: Secondary | ICD-10-CM

## 2022-11-13 DIAGNOSIS — M9902 Segmental and somatic dysfunction of thoracic region: Secondary | ICD-10-CM

## 2022-11-13 DIAGNOSIS — M5010 Cervical disc disorder with radiculopathy, unspecified cervical region: Secondary | ICD-10-CM

## 2022-11-13 DIAGNOSIS — M9901 Segmental and somatic dysfunction of cervical region: Secondary | ICD-10-CM

## 2022-11-13 DIAGNOSIS — M533 Sacrococcygeal disorders, not elsewhere classified: Secondary | ICD-10-CM | POA: Diagnosis not present

## 2022-11-13 NOTE — Patient Instructions (Addendum)
Good to see you Keep hands in peripheral vision See me in 6-8 weeks

## 2022-11-13 NOTE — Assessment & Plan Note (Signed)
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  Continue work on core strength.  Still has some difficulty with flexion of the back.  Patient will continue to work on home exercises and icing regimen.  Will monitor for any repetitive extension that causes pain follow-up again in 6 to 8 weeks this is a follow-up

## 2022-11-13 NOTE — Assessment & Plan Note (Signed)
Loss of lordosis focused on more muscle energy.  Discussed again about the strengthening of the upper back but keeping hands within peripheral vision.  Follow-up again in 6 to 8 weeks

## 2022-11-14 IMAGING — US US ABDOMEN LIMITED
1 series · 14 of 25 positions shown · non-contrast
Comparison: None.

CLINICAL DATA: Elevated liver function tests and elevated
bilirubin.

EXAM:
ULTRASOUND ABDOMEN LIMITED RIGHT UPPER QUADRANT

[Series 1: us abdomen limited · 0.26mm/px · 14 of 34 slices shown]
[im 1/34]
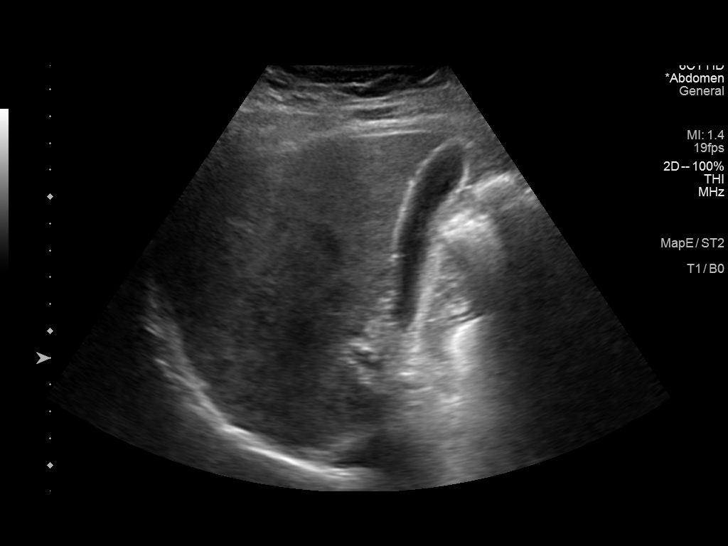
[im 3/34]
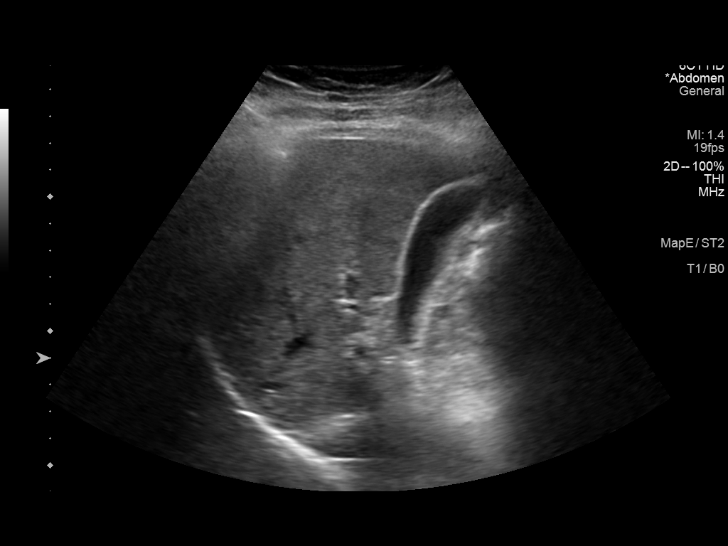
[im 6/34]
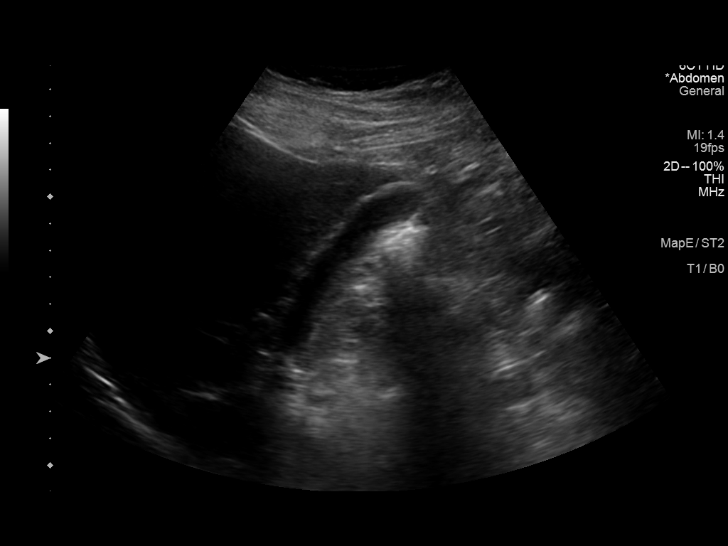
[im 9/34]
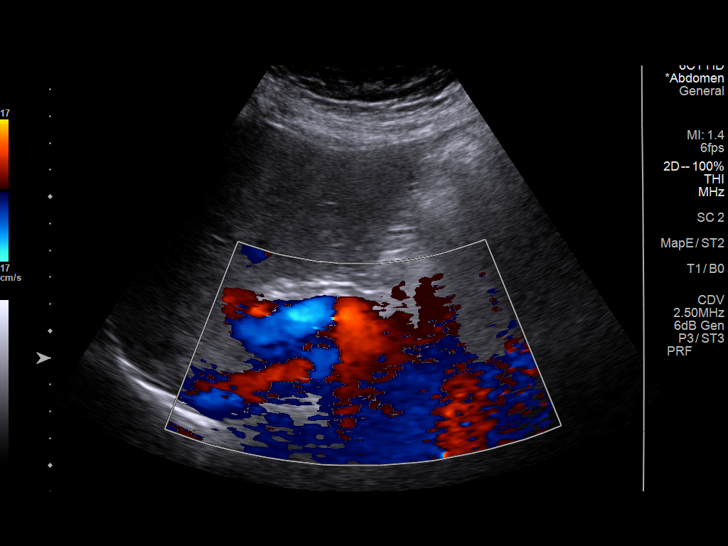
[im 12/34]
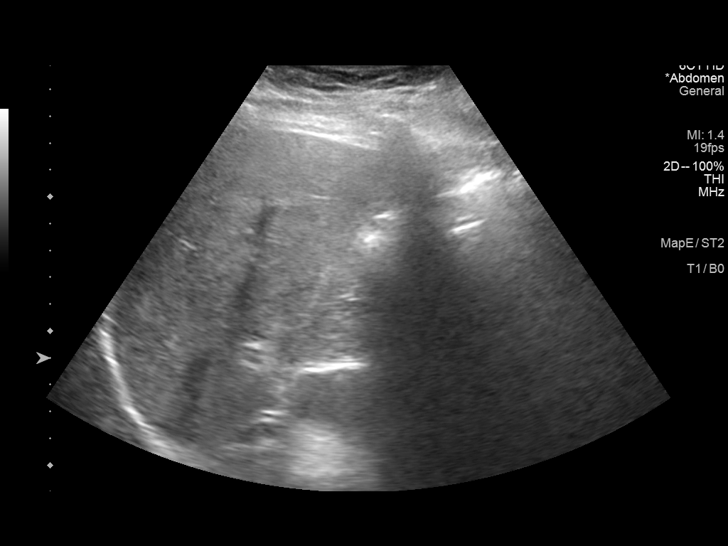
[im 13/34]
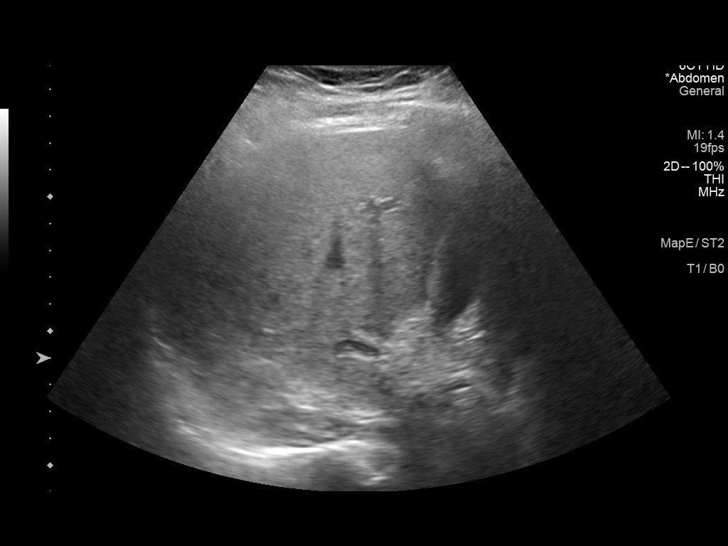
[im 16/34]
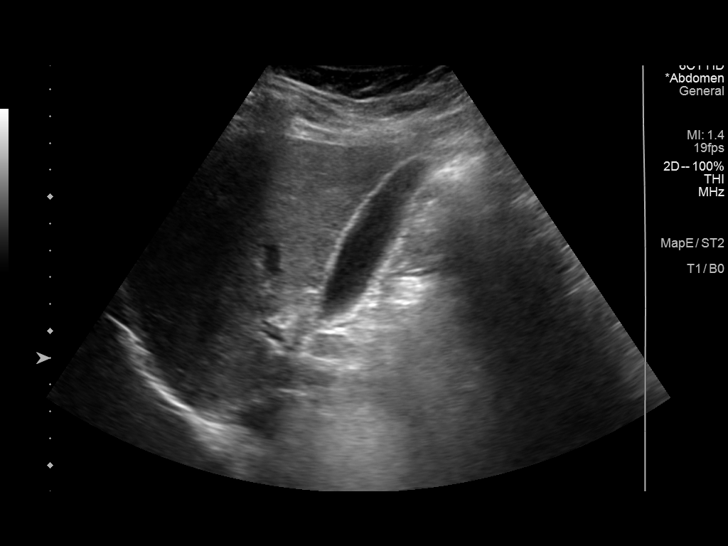
[im 18/34]
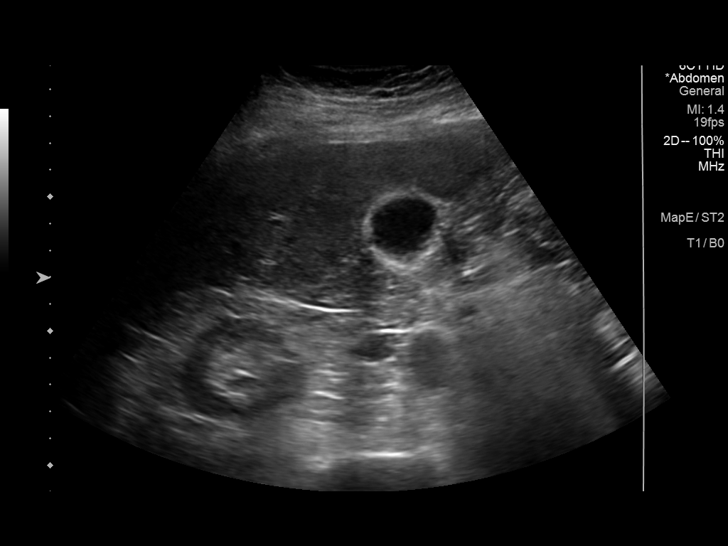
[im 21/34]
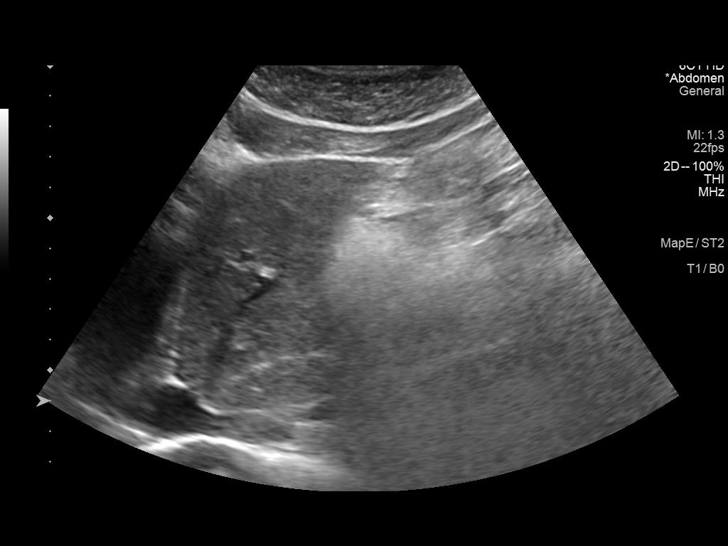
[im 23/34]
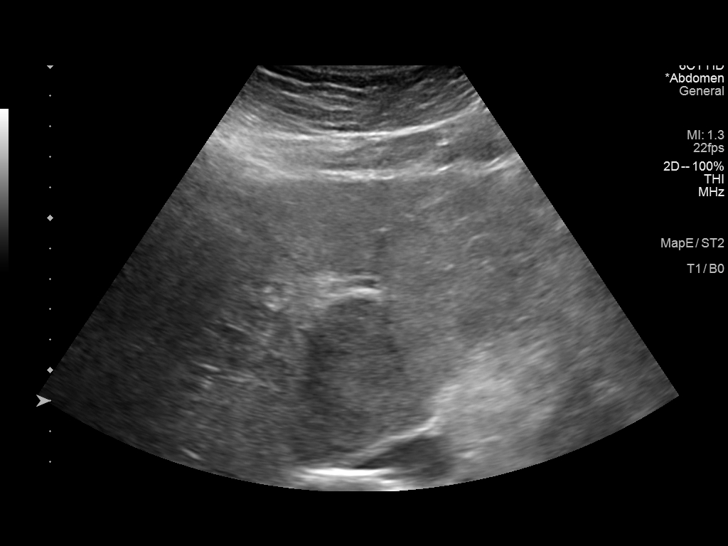
[im 25/34]
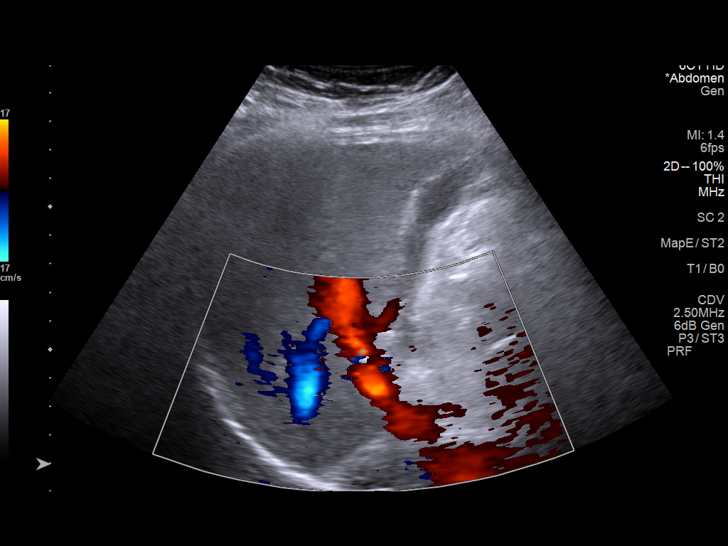
[im 28/34]
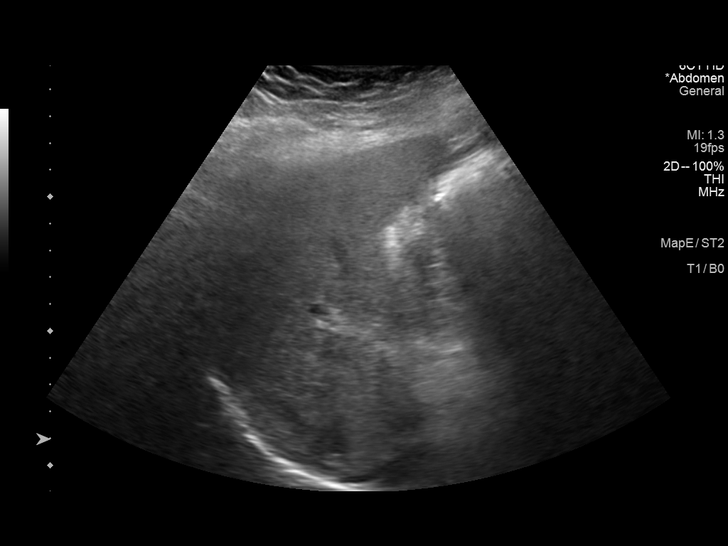
[im 31/34]
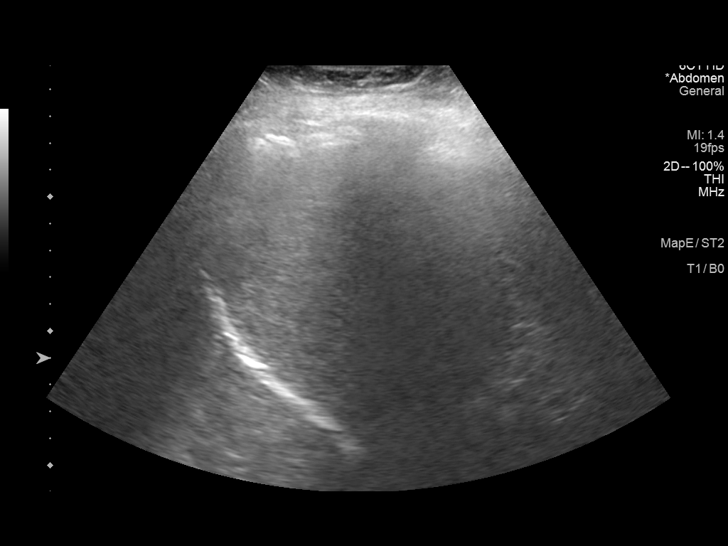
[im 34/34]
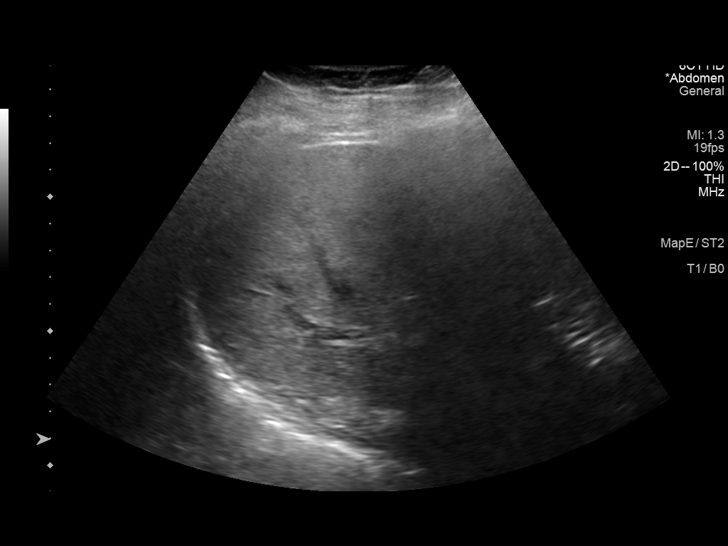

[14 of 25 positions shown; findings below may reference images not displayed]

FINDINGS: Gallbladder:

No gallstones or wall thickening visualized (2.6 mm). No sonographic
Murphy sign noted by sonographer.

Common bile duct:

Diameter: 3.1 mm (limited in evaluation secondary to overlying bowel
gas)

Liver:

No focal lesion identified. Liver parenchyma is mildly nodular in
echotexture and diffusely increased in echogenicity. Portal vein is
patent on color Doppler imaging with normal direction of blood flow
towards the liver.

Other: The study is limited secondary to overlying bowel gas.
IMPRESSION: Hepatic steatosis without focal liver lesions.

## 2022-12-02 ENCOUNTER — Ambulatory Visit: Payer: Medicare Other | Admitting: Dermatology

## 2022-12-21 NOTE — Progress Notes (Unsigned)
Cardiology Office Note:   Date:  12/23/2022  ID:  Michael [name], DOB [birth year], MRN [id number]  History of Present Illness:   Michael [name] is a 67 y.o. male Michael [name] is a 67 y.o. male with CAD, HTN and HLD.  He moved from [place].  Cardiac catheterization done prior to moving demonstrated a right coronary artery lesion that was treated with DES.  In September 2016, he was admitted for unstable angina.  Myoview was low risk.  He had angina again in August 2017 and underwent cardiac catheterization that showed patent stent in the RCA and left circumflex with disease in mid LAD.  He received 2 additional drug-eluting stents.  He was discharged but admitted later with similar chest pain and a troponin elevation a week after discharge.  Relook catheter showed patent LAD stents, left circumflex stent and RCA stent.  There was a 70% small diagonal lesion.  It was suspected he likely had coronary spasm.  Nitrate was added. He was readmitted to the hospital on 12/16/2018 with chest pain.  CT angiogram of the chest was negative for PE.  Myoview obtained on 12/17/2018 was intermediate risk study, finding showed large defect of severe severity present in the inferolateral wall consistent with scar, EF 38% with inferior anterolateral akinesis.  This was followed up using an echocardiogram on the same day which showed EF 40 to 45%, severe akinesis of the entire inferior wall.  No further study was recommended.  He was continued on aspirin, statin and Imdur at 60 mg daily. After my last appt with him he had an echo with a low normal EF.     Since I last saw him he has been doing well.  He has been swimming.  He denies any cardiovascular symptoms. The patient denies any new symptoms such as chest discomfort, neck or arm discomfort. There has been no new shortness of breath, PND or orthopnea. There have been no reported palpitations, presyncope or syncope. .  ROS: As stated in the HPI and negative for  all other systems.  Studies Reviewed:    EKG: Sinus rhythm, rate 68, axis within normal limits, intervals within normal limits, no acute ST-T wave changes.   Physical Exam:   VS:  BP 124/74 (BP Location: Left Arm, Patient Position: Sitting, Cuff Size: Normal)   Pulse 68   Ht 5\' 6"  (1.676 m)   Wt 192 lb (87.1 kg)   SpO2 99%   BMI 30.99 kg/m    Wt Readings from Last 3 Encounters:  12/23/22 192 lb (87.1 kg)  11/13/22 193 lb (87.5 kg)  10/02/22 198 lb (89.8 kg)     GEN: Well nourished, well developed in no acute distress NECK: No JVD; No carotid bruits CARDIAC: RRR, no murmurs, rubs, gallops RESPIRATORY:  Clear to auscultation without rales, wheezing or rhonchi  ABDOMEN: Soft, non-tender, non-distended EXTREMITIES:  No edema; No deformity   ASSESSMENT AND PLAN:    Ischemic cardiomyopathy:   His ejection fraction was low normal in 2023.  No further testing.  Will continue the meds as listed.   CAD:  The patient has no new sypmtoms.  No further cardiovascular testing is indicated.  We will continue with aggressive risk reduction and meds as listed.   Hypertension:   BP is at target.  No change in therapy.     Hyperlipidemia:  LDL was 52 although I do not have the most recent.  He said it was done by a primary provider and he is  going to send those results in My Chart.         Signed, Minus Breeding, MD

## 2022-12-23 ENCOUNTER — Ambulatory Visit: Payer: Medicare Other | Attending: Cardiology | Admitting: Cardiology

## 2022-12-23 ENCOUNTER — Encounter: Payer: Self-pay | Admitting: Cardiology

## 2022-12-23 VITALS — BP 124/74 | HR 68 | Ht 66.0 in | Wt 192.0 lb

## 2022-12-23 DIAGNOSIS — I340 Nonrheumatic mitral (valve) insufficiency: Secondary | ICD-10-CM

## 2022-12-23 DIAGNOSIS — I10 Essential (primary) hypertension: Secondary | ICD-10-CM | POA: Insufficient documentation

## 2022-12-23 DIAGNOSIS — I255 Ischemic cardiomyopathy: Secondary | ICD-10-CM | POA: Diagnosis not present

## 2022-12-23 DIAGNOSIS — I2510 Atherosclerotic heart disease of native coronary artery without angina pectoris: Secondary | ICD-10-CM | POA: Insufficient documentation

## 2022-12-23 DIAGNOSIS — E785 Hyperlipidemia, unspecified: Secondary | ICD-10-CM

## 2022-12-23 NOTE — Patient Instructions (Signed)
Medication Instructions:  Your physician recommends that you continue on your current medications as directed. Please refer to the Current Medication list given to you today.  *If you need a refill on your cardiac medications before your next appointment, please call your pharmacy*  Follow-Up: At [facility], you and your health needs are our priority.  As part of our continuing mission to provide you with exceptional heart care, we have created designated Provider Care Teams.  These Care Teams include your primary Cardiologist (physician) and Advanced Practice Providers (APPs -  Physician Assistants and Nurse Practitioners) who all work together to provide you with the care you need, when you need it.  We recommend signing up for the patient portal called "MyChart".  Sign up information is provided on this After Visit Summary.  MyChart is used to connect with patients for Virtual Visits (Telemedicine).  Patients are able to view lab/test results, encounter notes, upcoming appointments, etc.  Non-urgent messages can be sent to your provider as well.   To learn more about what you can do with MyChart, go to [email or web address].    Your next appointment:   12 month(s)  Provider:   James Hochrein, MD     

## 2022-12-31 NOTE — Progress Notes (Unsigned)
  [name] [address] [place] [address] Phone: [phone] Subjective:   Michael [name], am serving as a scribe for Dr. Antoine Primas.  I'm seeing this patient by the request  of:  Shelva Majestic, MD  CC: back and neck pain   [id number]  Michael [name] is a 67 y.o. male coming in with complaint of back and neck pain. OTM 11/13/2022. Patient states that he is doing well since last visit. Has been increasing weights at the gym.   Medications patient has been prescribed: Vit D  Taking:        Past Medical History:  Diagnosis Date   Acute lateral meniscal tear, right, initial encounter 09/26/2021   Injection given September 26, 2018.   CAD S/P PCI to Cx & RCA    a. s/p PCI of LCx (2006) and ostium of RCA (2007) as well as DES to RCA in [place] (05/2014)   HTN (hypertension)    Hyperlipidemia    Plantar fasciitis 10/12/2017    No Known Allergies   Review of Systems:  No headache, visual changes, nausea, vomiting, diarrhea, constipation, dizziness, abdominal pain, skin rash, fevers, chills, night sweats, weight loss, swollen lymph nodes, body aches, joint swelling, chest pain, shortness of breath, mood changes. POSITIVE muscle aches  Objective  Blood pressure 118/78, pulse 73, height 5\' 6"  (1.676 m), weight 192 lb (87.1 kg), SpO2 98 %.   General: No apparent distress alert and oriented x3 mood and affect normal, dressed appropriately.  HEENT: Pupils equal, extraocular movements intact  Respiratory: Patient's speak in full sentences and does not appear short of breath  Cardiovascular: No lower extremity edema, non tender, no erythema  Low back exam does have some loss of lordosis noted.  Some tenderness to palpation in the paraspinal musculature.  The patient's low back noted but has significant improvement in strength  Osteopathic findings  C4 flexed rotated and side bent right T3 extended rotated and side bent right  inhaled rib L1 flexed rotated and side bent right Sacrum right on right       Assessment and Plan:  SI (sacroiliac) joint dysfunction Discussed icing regimen and home exercises.  Patient is doing significantly better with the core strengthening at this moment.  Discussed icing regimen and home exercises, increase activity slowly.  Follow-up again in 8-10 weeks    Nonallopathic problems  Decision today to treat with OMT was based on Physical Exam  After verbal consent patient was treated with HVLA, ME, FPR techniques in cervical, rib, thoracic, lumbar, and sacral  areas  Patient tolerated the procedure well with improvement in symptoms  Patient given exercises, stretches and lifestyle modifications  See medications in patient instructions if given  Patient will follow up in 4-8 weeks      The above documentation has been reviewed and is accurate and complete Judi Saa, DO        Note: This dictation was prepared with Dragon dictation along with smaller phrase technology. Any transcriptional errors that result from this process are unintentional.

## 2023-01-01 ENCOUNTER — Encounter: Payer: Self-pay | Admitting: Family Medicine

## 2023-01-01 ENCOUNTER — Ambulatory Visit (INDEPENDENT_AMBULATORY_CARE_PROVIDER_SITE_OTHER): Payer: Medicare Other | Admitting: Family Medicine

## 2023-01-01 VITALS — BP 118/78 | HR 73 | Ht 66.0 in | Wt 192.0 lb

## 2023-01-01 DIAGNOSIS — M533 Sacrococcygeal disorders, not elsewhere classified: Secondary | ICD-10-CM

## 2023-01-01 DIAGNOSIS — M9908 Segmental and somatic dysfunction of rib cage: Secondary | ICD-10-CM

## 2023-01-01 DIAGNOSIS — M9901 Segmental and somatic dysfunction of cervical region: Secondary | ICD-10-CM

## 2023-01-01 DIAGNOSIS — M9903 Segmental and somatic dysfunction of lumbar region: Secondary | ICD-10-CM

## 2023-01-01 DIAGNOSIS — M9902 Segmental and somatic dysfunction of thoracic region: Secondary | ICD-10-CM

## 2023-01-01 DIAGNOSIS — M9904 Segmental and somatic dysfunction of sacral region: Secondary | ICD-10-CM | POA: Diagnosis not present

## 2023-01-01 NOTE — Patient Instructions (Signed)
Good to see you You are doing amazing Weight training is working See me in 8-10 weeks

## 2023-01-01 NOTE — Assessment & Plan Note (Addendum)
Discussed icing regimen and home exercises.  Patient is doing significantly better with the core strengthening at this moment.  Discussed icing regimen and home exercises, increase activity slowly.  Follow-up again in 8-10 weeks

## 2023-02-26 ENCOUNTER — Other Ambulatory Visit: Payer: Self-pay | Admitting: Gastroenterology

## 2023-03-10 LAB — HEMOGLOBIN A1C: Hemoglobin A1C: 6

## 2023-03-10 LAB — VITAMIN D 25 HYDROXY (VIT D DEFICIENCY, FRACTURES): Vit D, 25-Hydroxy: 44.7

## 2023-03-10 LAB — VITAMIN B12: Vitamin B-12: 229

## 2023-03-11 NOTE — Progress Notes (Signed)
[name] [address] [place] [address] Phone: [phone] Subjective:   Michael [name], am serving as a scribe for Dr. Antoine Primas.  I'm seeing this patient by the request  of:  Shelva Majestic, MD  CC: Back and neck pain follow-up  [id number]  Michael [name] is a 67 y.o. male coming in with complaint of back and neck pain. OMT 01/01/2023. Patient states would like to talk to you about shoulder. When exercising will get flares sometimes. Not enough to stop ADL. In both calfs feels knots. At one point tender to palpation. No MOI  Medications patient has been prescribed: None  Taking:         Reviewed prior external information including notes and imaging from previsou exam, outside providers and external EMR if available.   As well as notes that were available from care everywhere and other healthcare systems.  Past medical history, social, surgical and family history all reviewed in electronic medical record.  No pertanent information unless stated regarding to the chief complaint.   Past Medical History:  Diagnosis Date   Acute lateral meniscal tear, right, initial encounter 09/26/2021   Injection given September 26, 2018.   CAD S/P PCI to Cx & RCA    a. s/p PCI of LCx (2006) and ostium of RCA (2007) as well as DES to RCA in [place] (05/2014)   HTN (hypertension)    Hyperlipidemia    Plantar fasciitis 10/12/2017    No Known Allergies   Review of Systems:  No headache, visual changes, nausea, vomiting, diarrhea, constipation, dizziness, abdominal pain, skin rash, fevers, chills, night sweats, weight loss, swollen lymph nodes, body aches, joint swelling, chest pain, shortness of breath, mood changes. POSITIVE muscle aches  Objective  Blood pressure 122/74, pulse 66, height 5\' 6"  (1.676 m), weight 196 lb (88.9 kg), SpO2 98 %.   General: No apparent distress alert and oriented x3 mood and affect normal, dressed  appropriately.  HEENT: Pupils equal, extraocular movements intact  Respiratory: Patient's speak in full sentences and does not appear short of breath  Cardiovascular: No lower extremity edema, non tender, no erythema  Back exam does have some loss lordosis.  Some tenderness to palpation in the paraspinal musculature.  Patient still has some weakness in the core noted.  Tightness with Pearlean Brownie right greater than left.  Osteopathic findings  C2 flexed rotated and side bent right C5 flexed rotated and side bent left T3 extended rotated and side bent right inhaled rib T5 extended rotated and side bent left L2 flexed rotated and side bent right L4 flexed rotated and side bent left Sacrum right on right     Assessment and Plan:  SI (sacroiliac) joint dysfunction Chronic problem with exacerbation.  Discussed which activities to do and which ones to avoid.  Continue to work on core strengthening.  Follow-up again in 6 to 8 weeks  AC (acromioclavicular) arthritis Stable stable at this moment.  I discussed which activities to do and which ones to avoid.  Follow-up again in 6 to 8 weeks    Nonallopathic problems  Decision today to treat with OMT was based on Physical Exam  After verbal consent patient was treated with HVLA, ME, FPR techniques in cervical, rib, thoracic, lumbar, and sacral  areas  Patient tolerated the procedure well with improvement in symptoms  Patient given exercises, stretches and lifestyle modifications  See medications in patient instructions if given  Patient will follow up  in 4-8 weeks     The above documentation has been reviewed and is accurate and complete [name], DO         Note: This dictation was prepared with Dragon dictation along with smaller phrase technology. Any transcriptional errors that result from this process are unintentional.

## 2023-03-17 ENCOUNTER — Ambulatory Visit (INDEPENDENT_AMBULATORY_CARE_PROVIDER_SITE_OTHER): Payer: Medicare Other | Admitting: Family Medicine

## 2023-03-17 ENCOUNTER — Encounter: Payer: Self-pay | Admitting: Family Medicine

## 2023-03-17 VITALS — BP 122/74 | HR 66 | Ht 66.0 in | Wt 196.0 lb

## 2023-03-17 DIAGNOSIS — M9908 Segmental and somatic dysfunction of rib cage: Secondary | ICD-10-CM | POA: Diagnosis not present

## 2023-03-17 DIAGNOSIS — M9901 Segmental and somatic dysfunction of cervical region: Secondary | ICD-10-CM

## 2023-03-17 DIAGNOSIS — M9903 Segmental and somatic dysfunction of lumbar region: Secondary | ICD-10-CM | POA: Diagnosis not present

## 2023-03-17 DIAGNOSIS — M19012 Primary osteoarthritis, left shoulder: Secondary | ICD-10-CM | POA: Diagnosis not present

## 2023-03-17 DIAGNOSIS — M533 Sacrococcygeal disorders, not elsewhere classified: Secondary | ICD-10-CM | POA: Diagnosis not present

## 2023-03-17 DIAGNOSIS — M9902 Segmental and somatic dysfunction of thoracic region: Secondary | ICD-10-CM

## 2023-03-17 DIAGNOSIS — M9904 Segmental and somatic dysfunction of sacral region: Secondary | ICD-10-CM

## 2023-03-17 DIAGNOSIS — M19011 Primary osteoarthritis, right shoulder: Secondary | ICD-10-CM

## 2023-03-17 NOTE — Assessment & Plan Note (Signed)
Stable stable at this moment.  I discussed which activities to do and which ones to avoid.  Follow-up again in 6 to 8 weeks

## 2023-03-17 NOTE — Patient Instructions (Signed)
There than swimming keep hands in peripheral vision Good luck in [place] See you again in 2-3 months

## 2023-03-17 NOTE — Assessment & Plan Note (Signed)
Chronic problem with exacerbation.  Discussed which activities to do and which ones to avoid.  Continue to work on core strengthening.  Follow-up again in 6 to 8 weeks

## 2023-03-27 ENCOUNTER — Telehealth: Payer: Self-pay | Admitting: Family Medicine

## 2023-03-27 NOTE — Telephone Encounter (Signed)
FYI

## 2023-03-27 NOTE — Telephone Encounter (Signed)
Dr. Jimmie Molly states he wants Dr. Durene Cal to know that he will be sending Dr. Durene Cal a message via fax [place] system re: Patient for Dr. Durene Cal to review asap

## 2023-04-02 ENCOUNTER — Ambulatory Visit (INDEPENDENT_AMBULATORY_CARE_PROVIDER_SITE_OTHER): Payer: Medicare Other

## 2023-04-02 VITALS — BP 144/78 | HR 92 | Wt 195.4 lb

## 2023-04-02 DIAGNOSIS — Z0000 Encounter for general adult medical examination without abnormal findings: Secondary | ICD-10-CM | POA: Diagnosis not present

## 2023-04-02 NOTE — Patient Instructions (Signed)
Michael [name] , Thank you for taking time to come for your Medicare Wellness Visit. I appreciate your ongoing commitment to your health goals. Please review the following plan we discussed and let me know if I can assist you in the future.   These are the goals we discussed:  Goals      Patient Stated     None at this time        This is a list of the screening recommended for you and due dates:  Health Maintenance  Topic Date Due   COVID-19 Vaccine (1) Never done   Zoster (Shingles) Vaccine (1 of 2) Never done   Pneumonia Vaccine (2 of 2 - PCV) 08/03/2021   Colon Cancer Screening  01/21/2023   Flu Shot  04/23/2023   Medicare Annual Wellness Visit  04/01/2024   DTaP/Tdap/Td vaccine (2 - Tdap) 12/09/2027   Hepatitis C Screening  Completed   HPV Vaccine  Aged Out    Advanced directives: copies in chart   Conditions/risks identified: lose 10 lbs   Next appointment: Follow up in one year for your annual wellness visit.   Preventive Care 46 Years and Older, Male  Preventive care refers to lifestyle choices and visits with your health care provider that can promote health and wellness. What does preventive care include? A yearly physical exam. This is also called an annual well check. Dental exams once or twice a year. Routine eye exams. Ask your health care provider how often you should have your eyes checked. Personal lifestyle choices, including: Daily care of your teeth and gums. Regular physical activity. Eating a healthy diet. Avoiding tobacco and drug use. Limiting alcohol use. Practicing safe sex. Taking low doses of aspirin every day. Taking vitamin and mineral supplements as recommended by your health care provider. What happens during an annual well check? The services and screenings done by your health care provider during your annual well check will depend on your age, overall health, lifestyle risk factors, and family history of disease. Counseling  Your  health care provider may ask you questions about your: Alcohol use. Tobacco use. Drug use. Emotional well-being. Home and relationship well-being. Sexual activity. Eating habits. History of falls. Memory and ability to understand (cognition). Work and work Astronomer. Screening  You may have the following tests or measurements: Height, weight, and BMI. Blood pressure. Lipid and cholesterol levels. These may be checked every 5 years, or more frequently if you are over 42 years old. Skin check. Lung cancer screening. You may have this screening every year starting at age 68 if you have a 30-pack-year history of smoking and currently smoke or have quit within the past 15 years. Fecal occult blood test (FOBT) of the stool. You may have this test every year starting at age 68. Flexible sigmoidoscopy or colonoscopy. You may have a sigmoidoscopy every 5 years or a colonoscopy every 10 years starting at age 68. Prostate cancer screening. Recommendations will vary depending on your family history and other risks. Hepatitis C blood test. Hepatitis B blood test. Sexually transmitted disease (STD) testing. Diabetes screening. This is done by checking your blood sugar (glucose) after you have not eaten for a while (fasting). You may have this done every 1-3 years. Abdominal aortic aneurysm (AAA) screening. You may need this if you are a current or former smoker. Osteoporosis. You may be screened starting at age 68 if you are at high risk. Talk with your health care provider about your test results,  treatment options, and if necessary, the need for more tests. Vaccines  Your health care provider may recommend certain vaccines, such as: Influenza vaccine. This is recommended every year. Tetanus, diphtheria, and acellular pertussis (Tdap, Td) vaccine. You may need a Td booster every 10 years. Zoster vaccine. You may need this after age 68. Pneumococcal 13-valent conjugate (PCV13) vaccine. One dose  is recommended after age 68. Pneumococcal polysaccharide (PPSV23) vaccine. One dose is recommended after age 64. Talk to your health care provider about which screenings and vaccines you need and how often you need them. This information is not intended to replace advice given to you by your health care provider. Make sure you discuss any questions you have with your health care provider. Document Released: 10/05/2015 Document Revised: 05/28/2016 Document Reviewed: 07/10/2015 Elsevier Interactive Patient Education  2017 [employer].  Fall Prevention in the Home Falls can cause injuries. They can happen to people of all ages. There are many things you can do to make your home safe and to help prevent falls. What can I do on the outside of my home? Regularly fix the edges of walkways and driveways and fix any cracks. Remove anything that might make you trip as you walk through a door, such as a raised step or threshold. Trim any bushes or trees on the path to your home. Use bright outdoor lighting. Clear any walking paths of anything that might make someone trip, such as rocks or tools. Regularly check to see if handrails are loose or broken. Make sure that both sides of any steps have handrails. Any raised decks and porches should have guardrails on the edges. Have any leaves, snow, or ice cleared regularly. Use sand or salt on walking paths during winter. Clean up any spills in your garage right away. This includes oil or grease spills. What can I do in the bathroom? Use night lights. Install grab bars by the toilet and in the tub and shower. Do not use towel bars as grab bars. Use non-skid mats or decals in the tub or shower. If you need to sit down in the shower, use a plastic, non-slip stool. Keep the floor dry. Clean up any water that spills on the floor as soon as it happens. Remove soap buildup in the tub or shower regularly. Attach bath mats securely with double-sided non-slip rug  tape. Do not have throw rugs and other things on the floor that can make you trip. What can I do in the bedroom? Use night lights. Make sure that you have a light by your bed that is easy to reach. Do not use any sheets or blankets that are too big for your bed. They should not hang down onto the floor. Have a firm chair that has side arms. You can use this for support while you get dressed. Do not have throw rugs and other things on the floor that can make you trip. What can I do in the kitchen? Clean up any spills right away. Avoid walking on wet floors. Keep items that you use a lot in easy-to-reach places. If you need to reach something above you, use a strong step stool that has a grab bar. Keep electrical cords out of the way. Do not use floor polish or wax that makes floors slippery. If you must use wax, use non-skid floor wax. Do not have throw rugs and other things on the floor that can make you trip. What can I do with my stairs? Do  not leave any items on the stairs. Make sure that there are handrails on both sides of the stairs and use them. Fix handrails that are broken or loose. Make sure that handrails are as long as the stairways. Check any carpeting to make sure that it is firmly attached to the stairs. Fix any carpet that is loose or worn. Avoid having throw rugs at the top or bottom of the stairs. If you do have throw rugs, attach them to the floor with carpet tape. Make sure that you have a light switch at the top of the stairs and the bottom of the stairs. If you do not have them, ask someone to add them for you. What else can I do to help prevent falls? Wear shoes that: Do not have high heels. Have rubber bottoms. Are comfortable and fit you well. Are closed at the toe. Do not wear sandals. If you use a stepladder: Make sure that it is fully opened. Do not climb a closed stepladder. Make sure that both sides of the stepladder are locked into place. Ask someone to  hold it for you, if possible. Clearly mark and make sure that you can see: Any grab bars or handrails. First and last steps. Where the edge of each step is. Use tools that help you move around (mobility aids) if they are needed. These include: Canes. Walkers. Scooters. Crutches. Turn on the lights when you go into a dark area. Replace any light bulbs as soon as they burn out. Set up your furniture so you have a clear path. Avoid moving your furniture around. If any of your floors are uneven, fix them. If there are any pets around you, be aware of where they are. Review your medicines with your doctor. Some medicines can make you feel dizzy. This can increase your chance of falling. Ask your doctor what other things that you can do to help prevent falls. This information is not intended to replace advice given to you by your health care provider. Make sure you discuss any questions you have with your health care provider. Document Released: 07/05/2009 Document Revised: 02/14/2016 Document Reviewed: 10/13/2014 Elsevier Interactive Patient Education  2017 [employer].

## 2023-04-02 NOTE — Progress Notes (Addendum)
Subjective:   Michael [name] is a 67 y.o. male who presents for Medicare Annual/Subsequent preventive examination.  Visit Complete: In person  Review of Systems     Cardiac Risk Factors include: advanced age (>38men, >87 women);hypertension;dyslipidemia;male gender;obesity (BMI >30kg/m2)     Objective:    Today's Vitals   04/02/23 1604  BP: (!) 144/78  Pulse: 92  SpO2: 95%  Weight: 195 lb 6.4 oz (88.6 kg)   Body mass index is 31.54 kg/m.     04/02/2023    4:08 PM 01/29/2022    7:59 AM 01/15/2022    2:20 PM 09/24/2021    8:59 AM 01/19/2021    4:03 PM 08/05/2019    4:40 PM 05/23/2019    9:29 AM  Advanced Directives  Does Patient Have a Medical Advance Directive? Yes No No Yes No No No  Type of Estate agent of [place];Living will   Healthcare Power of Attorney     Does patient want to make changes to medical advance directive? No - Patient declined        Copy of Healthcare Power of Attorney in Chart? Yes - validated most recent copy scanned in chart (See row information)   Yes - validated most recent copy scanned in chart (See row information)     Would patient like information on creating a medical advance directive?  No - Patient declined No - Patient declined  No - Patient declined  No - Patient declined    Current Medications (verified) Outpatient Encounter Medications as of 04/02/2023  Medication Sig   aspirin 81 MG tablet Take 81 mg by mouth daily.   atorvastatin (LIPITOR) 80 MG tablet TAKE 1 TABLET DAILY   CHERRY PO Take 50 mg by mouth daily. Cherry extract   Cinnamon 500 MG capsule Take 500 mg by mouth daily.   Coenzyme Q10 (Q-SORB CO Q-10) 100 MG capsule Take 1 capsule by mouth daily.   EPINEPHrine 0.3 mg/0.3 mL IJ SOAJ injection See admin instructions.   famotidine (PEPCID) 40 MG tablet Take 1 tablet (40 mg total) by mouth daily.   hydrocortisone (ANUSOL-HC) 2.5 % rectal cream PLACE 1 APPLICATION RECTALLY 2 (TWO) TIMES DAILY. PATIENT  NEEDS OFFICE VISIT.   isosorbide mononitrate (IMDUR) 60 MG 24 hr tablet TAKE 1 TABLET DAILY   nitroGLYCERIN (NITROSTAT) 0.4 MG SL tablet DISSOLVE 1 TABLET UNDER THE TONGUE EVERY 5 MINUTES AS NEEDED FOR CHEST PAIN UP TO 3 DOSES   predniSONE (DELTASONE) 20 MG tablet Take by mouth.   ticagrelor (BRILINTA) 60 MG TABS tablet Take 60 mg by mouth 2 (two) times daily.   Turmeric (CURCUMIN 95) 500 MG CAPS Take 1 capsule by mouth daily.   Turmeric 500 MG CAPS Take 500 mg by mouth daily.   Vitamin D, Ergocalciferol, (DRISDOL) 1.25 MG (50000 UNIT) CAPS capsule TAKE 1 CAPSULE EVERY 7 DAYS   [DISCONTINUED] Cephalexin 500 MG tablet Take 500 mg by mouth 3 (three) times daily.   [DISCONTINUED] Calcium & Magnesium Carbonates (MYLANTA PO) Take by mouth as needed.   No facility-administered encounter medications on file as of 04/02/2023.    Allergies (verified) Patient has no known allergies.   History: Past Medical History:  Diagnosis Date   Acute lateral meniscal tear, right, initial encounter 09/26/2021   Injection given September 26, 2018.   CAD S/P PCI to Cx & RCA    a. s/p PCI of LCx (2006) and ostium of RCA (2007) as well as DES to RCA in  [place] (05/2014)   HTN (hypertension)    Hyperlipidemia    Plantar fasciitis 10/12/2017   Past Surgical History:  Procedure Laterality Date   CARDIAC CATHETERIZATION N/A 05/06/2016   Procedure: Left Heart Cath and Coronary Angiography;  Surgeon: Yvonne Kendall, MD;  Location: [facility] INVASIVE CV LAB;  Service: Cardiovascular;  Laterality: N/A;   CARDIAC CATHETERIZATION N/A 05/06/2016   Procedure: Coronary Stent Intervention;  Surgeon: Yvonne Kendall, MD;  Location: [facility] INVASIVE CV LAB;  Service: Cardiovascular;  Laterality: N/A;   CARDIAC CATHETERIZATION N/A 05/13/2016   Procedure: Coronary/Graft Angiography;  Surgeon: Peter M [place], MD;  Location: [facility] INVASIVE CV LAB;  Service: Cardiovascular;  Laterality: N/A;   CORONARY ANGIOPLASTY WITH STENT PLACEMENT  2006   in  [place], occluded CFX, s/p Taxus stent   CORONARY ANGIOPLASTY WITH STENT PLACEMENT  2007   in [place], ostial RCA stent   CORONARY ANGIOPLASTY WITH STENT PLACEMENT  2015   in , RCA stent   TONSILLECTOMY AND ADENOIDECTOMY      Family History  Problem Relation Age of Onset   Hypertension Father    Dementia Father        Vascular. patient states dementia/possible alzheimers as well   Hyperlipidemia Sister    Cancer Brother        type unknown   Cancer Brother        type unknown   Heart disease Maternal Grandfather 80       Died probably of heart diseasse   Heart disease Maternal Uncle 57       Died of "heart exploding"   Cerebral palsy Daughter    Stroke Son        severe problems with blood clotting   Colon cancer Neg Hx    Esophageal cancer Neg Hx    Pancreatic cancer Neg Hx    Stomach cancer Neg Hx    Liver disease Neg Hx    Social History   Socioeconomic History   Marital status: Married    Spouse name: Not on file   Number of children: 4   Years of education: Not on file   Highest education level: Not on file  Occupational History   Occupation: retired  Tobacco Use   Smoking status: Never   Smokeless tobacco: Never  Vaping Use   Vaping status: Never Used  Substance and Sexual Activity   Alcohol use: No    Alcohol/week: 0.0 standard drinks of alcohol   Drug use: No   Sexual activity: Yes  Other Topics Concern   Not on file  Social History Narrative   Lives with wife.  4 kids total (2 step, 2 biological). 1 that has CP. Daughter Michael [name]- comes to [facility]).        Retired Water quality scientist.  Oldest daughter has CP      Hobbies: gardening, lawn care, care for grandkids   Social Determinants of Health   Financial Resource Strain: Low Risk  (04/02/2023)   Overall Financial Resource Strain (CARDIA)    Difficulty of Paying Living Expenses: Not hard at all  Food Insecurity: No Food Insecurity (04/02/2023)   Hunger Vital Sign     Worried About Running Out of Food in the Last Year: Never true    Ran Out of Food in the Last Year: Never true  Transportation Needs: No Transportation Needs (04/02/2023)   PRAPARE - Administrator, Civil Service (Medical): No    Lack of Transportation (Non-Medical): No  Physical  Activity: Sufficiently Active (04/02/2023)   Exercise Vital Sign    Days of Exercise per Week: 2 days    Minutes of Exercise per Session: 90 min  Stress: No Stress Concern Present (04/02/2023)   [employer] of Occupational Health - Occupational Stress Questionnaire    Feeling of Stress : Not at all  Social Connections: Moderately Integrated (04/02/2023)   Social Connection and Isolation Panel [NHANES]    Frequency of Communication with Friends and Family: More than three times a week    Frequency of Social Gatherings with Friends and Family: More than three times a week    Attends Religious Services: More than 4 times per year    Active Member of [employer] or Organizations: No    Attends Engineer, structural: Never    Marital Status: Married    Tobacco Counseling Counseling given: Not Answered   Clinical Intake:  Pre-visit preparation completed: Yes  Pain : No/denies pain     BMI - recorded: 31.54 Nutritional Status: BMI > 30  Obese Nutritional Risks: None Diabetes: No  How often do you need to have someone help you when you read instructions, pamphlets, or other written materials from your doctor or pharmacy?: 1 - Never  Interpreter Needed?: No  Information entered by :: Lanier Ensign, LPN   Activities of Daily Living    04/02/2023    4:05 PM  In your present state of health, do you have any difficulty performing the following activities:  Hearing? 0  Vision? 0  Difficulty concentrating or making decisions? 0  Walking or climbing stairs? 0  Dressing or bathing? 0  Doing errands, shopping? 0  Preparing Food and eating ? N  Using the Toilet? N  In the past six  months, have you accidently leaked urine? N  Do you have problems with loss of bowel control? N  Managing your Medications? N  Managing your Finances? N  Housekeeping or managing your Housekeeping? N    Patient Care Team: Shelva Majestic, MD as PCP - General (Family Medicine) Rollene Rotunda, MD as PCP - Cardiology (Cardiology) Sedalia Muta, PT as Physical Therapist (Physical Therapy) Janalyn Harder, MD (Inactive) as Consulting Physician (Dermatology)  Indicate any recent Medical Services you may have received from other than [facility] providers in the past year (date may be approximate).     Assessment:   This is a routine wellness examination for [place].  Hearing/Vision screen Hearing Screening - Comments:: Pt denies any hearing issues  Vision Screening - Comments:: Pt follows up with Dr Martha Clan for annual ey exams   Dietary issues and exercise activities discussed:     Goals Addressed             This Visit's Progress    Patient Stated       Lose 10 lbs        Depression Screen    04/02/2023    4:07 PM 09/24/2021    8:58 AM 02/05/2021    8:00 AM 09/14/2019    8:12 AM 03/15/2019    9:20 AM 09/29/2018   11:15 AM 12/08/2017    8:44 AM  PHQ 2/9 Scores  PHQ - 2 Score 0 0 0 0 0 0 0  PHQ- 9 Score   0        Fall Risk    04/02/2023    4:09 PM 09/24/2021    8:59 AM 02/05/2021    8:00 AM 08/03/2020    1:21 PM 12/08/2017  8:44 AM  Fall Risk   Falls in the past year? 0 0 0 0 No  Number falls in past yr: 0 0 0 0   Injury with Fall? 0 0 0 0   Risk for fall due to : Impaired vision Impaired vision     Follow up Falls prevention discussed Falls prevention discussed       MEDICARE RISK AT HOME:  Medicare Risk at Home - 04/02/23 1609     Any stairs in or around the home? Yes    If so, are there any without handrails? No    Home free of loose throw rugs in walkways, pet beds, electrical cords, etc? Yes    Adequate lighting in your home to reduce risk of falls? Yes     Life alert? No    Use of a cane, walker or w/c? No    Grab bars in the bathroom? Yes    Shower chair or bench in shower? No    Elevated toilet seat or a handicapped toilet? No             TIMED UP AND GO:  Was the test performed?  Yes  Length of time to ambulate 10 feet: 10 sec Gait steady and fast without use of assistive device    Cognitive Function:        04/02/2023    4:09 PM 09/24/2021    8:53 AM  6CIT Screen  What Year? 0 points 0 points  What month? 0 points 0 points  What time? 0 points 0 points  Count back from 20 0 points 0 points  Months in reverse 0 points 0 points  Repeat phrase 0 points 2 points  Total Score 0 points 2 points    Immunizations Immunization History  Administered Date(s) Administered   Fluad Quad(high Dose 65+) 08/03/2020, 08/07/2021   Influenza,inj,Quad PF,6+ Mos 06/07/2018, 09/14/2019   Influenza-Unspecified 06/26/2014   Pneumococcal Polysaccharide-23 08/03/2020   Td 12/08/2017    TDAP status: Up to date  Flu Vaccine status: Up to date  Pneumococcal vaccine status: Due, Education has been provided regarding the importance of this vaccine. Advised may receive this vaccine at local pharmacy or Health Dept. Aware to provide a copy of the vaccination record if obtained from local pharmacy or Health Dept. Verbalized acceptance and understanding.  Covid-19 vaccine status: Declined, Education has been provided regarding the importance of this vaccine but patient still declined. Advised may receive this vaccine at local pharmacy or Health Dept.or vaccine clinic. Aware to provide a copy of the vaccination record if obtained from local pharmacy or Health Dept. Verbalized acceptance and understanding.  Qualifies for Shingles Vaccine? Yes   Zostavax completed No   Shingrix Completed?: No.    Education has been provided regarding the importance of this vaccine. Patient has been advised to call insurance company to determine out of pocket expense  if they have not yet received this vaccine. Advised may also receive vaccine at local pharmacy or Health Dept. Verbalized acceptance and understanding.  Screening Tests Health Maintenance  Topic Date Due   COVID-19 Vaccine (1) Never done   Zoster Vaccines- Shingrix (1 of 2) Never done   Pneumonia Vaccine 71+ Years old (2 of 2 - PCV) 08/03/2021   Colonoscopy  04/01/2024 (Originally 01/21/2023)   INFLUENZA VACCINE  04/23/2023   Medicare Annual Wellness (AWV)  04/01/2024   DTaP/Tdap/Td (2 - Tdap) 12/09/2027   Hepatitis C Screening  Completed   HPV VACCINES  Aged Out    Health Maintenance  Health Maintenance Due  Topic Date Due   COVID-19 Vaccine (1) Never done   Zoster Vaccines- Shingrix (1 of 2) Never done   Pneumonia Vaccine 68+ Years old (2 of 2 - PCV) 08/03/2021    Colorectal cancer screening: Type of screening: Colonoscopy. Completed 01/20/18. Repeat every 5 years   Additional Screening:  Hepatitis C Screening:  Completed 12/08/17  Vision Screening: Recommended annual ophthalmology exams for early detection of glaucoma and other disorders of the eye. Is the patient up to date with their annual eye exam?  Yes  Who is the provider or what is the name of the office in which the patient attends annual eye exams? Dr Martha Clan  If pt is not established with a provider, would they like to be referred to a provider to establish care? No .   Dental Screening: Recommended annual dental exams for proper oral hygiene    Community Resource Referral / Chronic Care Management: CRR required this visit?  No   CCM required this visit?  No     Plan:     I have personally reviewed and noted the following in the patient's chart:   Medical and social history Use of alcohol, tobacco or illicit drugs  Current medications and supplements including opioid prescriptions. Patient is not currently taking opioid prescriptions. Functional ability and status Nutritional status Physical  activity Advanced directives List of other physicians Hospitalizations, surgeries, and ER visits in previous 12 months Vitals Screenings to include cognitive, depression, and falls Referrals and appointments  In addition, I have reviewed and discussed with patient certain preventive protocols, quality metrics, and best practice recommendations. A written personalized care plan for preventive services as well as general preventive health recommendations were provided to patient.     Marzella Schlein, LPN   1/61/0960   After Visit Summary: (MyChart) Due to this being a telephonic visit, the after visit summary with patients personalized plan was offered to patient via MyChart   Nurse Notes:  pt stated Bp elevated, was due to some scheduling confusion so he was a little rushed

## 2023-04-04 NOTE — Telephone Encounter (Signed)
Dr. Jimmie Molly recommended visit- lets schedule him for next available with me

## 2023-04-06 NOTE — Telephone Encounter (Signed)
 See below

## 2023-04-23 ENCOUNTER — Encounter: Payer: Self-pay | Admitting: Family Medicine

## 2023-04-23 ENCOUNTER — Ambulatory Visit (INDEPENDENT_AMBULATORY_CARE_PROVIDER_SITE_OTHER): Payer: Medicare Other | Admitting: Family Medicine

## 2023-04-23 VITALS — BP 120/70 | HR 68 | Temp 98.8°F | Ht 66.0 in | Wt 190.0 lb

## 2023-04-23 DIAGNOSIS — R21 Rash and other nonspecific skin eruption: Secondary | ICD-10-CM

## 2023-04-23 DIAGNOSIS — I8001 Phlebitis and thrombophlebitis of superficial vessels of right lower extremity: Secondary | ICD-10-CM | POA: Diagnosis not present

## 2023-04-23 DIAGNOSIS — Z125 Encounter for screening for malignant neoplasm of prostate: Secondary | ICD-10-CM

## 2023-04-23 DIAGNOSIS — I25118 Atherosclerotic heart disease of native coronary artery with other forms of angina pectoris: Secondary | ICD-10-CM

## 2023-04-23 NOTE — Progress Notes (Signed)
Phone [phone] In person visit   Subjective:   Michael [name] is a 67 y.o. year old very pleasant male patient who presents for/with See problem oriented charting Chief Complaint  Patient presents with   Medical Management of Chronic Issues   Hypertension   Past Medical History-  Patient Active Problem List   Diagnosis Date Noted   Ischemic cardiomyopathy 12/05/2019    Priority: High   CAD S/P multiple PCIs 09/08/2014    Priority: High   History of adenomatous polyp of colon 06/07/2018    Priority: Medium    BPH (benign prostatic hyperplasia) 06/07/2018    Priority: Medium    Impaired glucose tolerance 11/03/2016    Priority: Medium    HTN (hypertension)     Priority: Medium    Dyslipidemia 09/08/2014    Priority: Medium    Left shoulder pain 01/24/2021    Priority: Low   AC (acromioclavicular) arthritis 01/24/2021    Priority: Low   Vitiligo 06/07/2018    Priority: Low   Gluteal pain 04/10/2018    Priority: Low   Back pain 04/13/2017    Priority: Low   Nonallopathic lesion of thoracic region 04/13/2017    Priority: Low   Nonallopathic lesion of sacral region 04/13/2017    Priority: Low   Nonallopathic lesion of lumbosacral region 04/13/2017    Priority: Low   Obesity     Priority: Low   SI (sacroiliac) joint dysfunction 09/02/2022   Gluteal tendinitis of right buttock 03/04/2022   Coronary artery disease with stable angina pectoris, unspecified vessel or lesion type, unspecified whether native or transplanted heart (HCC) 02/06/2022   Degenerative arthritis of right knee 11/15/2021   Cervical disc disorder with radiculopathy of cervical region 03/15/2021   Abnormal chest x-ray 01/24/2021   Right carpal tunnel syndrome 09/05/2020   Perianal irritation 06/08/2020    Medications- reviewed and updated Current Outpatient Medications  Medication Sig Dispense Refill   aspirin 81 MG tablet Take 81 mg by mouth daily.     atorvastatin (LIPITOR) 80 MG tablet  TAKE 1 TABLET DAILY 90 tablet 3   CHERRY PO Take 50 mg by mouth daily. Cherry extract     Cinnamon 500 MG capsule Take 500 mg by mouth daily.     Coenzyme Q10 (Q-SORB CO Q-10) 100 MG capsule Take 1 capsule by mouth daily.     EPINEPHrine 0.3 mg/0.3 mL IJ SOAJ injection See admin instructions.     famotidine (PEPCID) 40 MG tablet Take 1 tablet (40 mg total) by mouth daily. 90 tablet 3   hydrocortisone (ANUSOL-HC) 2.5 % rectal cream PLACE 1 APPLICATION RECTALLY 2 (TWO) TIMES DAILY. PATIENT NEEDS OFFICE VISIT. 30 g 0   isosorbide mononitrate (IMDUR) 60 MG 24 hr tablet TAKE 1 TABLET DAILY 90 tablet 3   nitroGLYCERIN (NITROSTAT) 0.4 MG SL tablet DISSOLVE 1 TABLET UNDER THE TONGUE EVERY 5 MINUTES AS NEEDED FOR CHEST PAIN UP TO 3 DOSES 25 tablet 1   ticagrelor (BRILINTA) 60 MG TABS tablet Take 60 mg by mouth 2 (two) times daily.     Turmeric (CURCUMIN 95) 500 MG CAPS Take 1 capsule by mouth daily.     Turmeric 500 MG CAPS Take 500 mg by mouth daily.     Vitamin D, Ergocalciferol, (DRISDOL) 1.25 MG (50000 UNIT) CAPS capsule TAKE 1 CAPSULE EVERY 7 DAYS 12 capsule 3   No current facility-administered medications for this visit.     Objective:  BP 120/70   Pulse 68  Temp 98.8 F (37.1 C)   Ht 5\' 6"  (1.676 m)   Wt 190 lb (86.2 kg)   SpO2 98%   BMI 30.67 kg/m  Gen: NAD, resting comfortably CV: RRR no murmurs rubs or gallops Lungs: CTAB no crackles, wheeze, rhonchi Abdomen: soft/nontender/nondistended/normal bowel sounds. No rebound or guarding.  Ext: no edema Skin: warm, dry, erythematous slightly raised rash over right lower leg spanning over 10 cm on the lower portion of the upper right leg as well as a similar patch on the upper right leg     Assessment and Plan   # Pruritic rash S:patient states was having itching sometime early 2023 and into the fall. At that time,  he came off a b3 vitamin and that seemed to help. Last year also seen allergist and not tested for food allergies- he's  tried to make adjustments since that time. Last year was also given triamcinolone that helped some. Things seemed to calm down last year- at our last visit 02/06/22  we had talked about non scented soaps or claritin before bed- change in soaps did not help. At that time main complaint was rash/itching on hands/arms and body as well he states.   More recently about 2 months ago he noted recurrence of symptoms.   He also got off vitamin D previously as levels too high-  he has been doing once a month vitamin D 82956 units through Dr. Earlene Plater with [facility] initially but the went to once a month. Also stopped B12. None of these changes helped. He has tried OTC (available over the counter without a prescription) benadryl  with minimal relief- and then lately using topical gold bond eczema cream- helped with redness. He is getting intermittent patches of itchy red raised skin- the one today seemed to come up from keys rubbing on his leg- also where shirt rubs on his neck or belt rubs- any area of more intense pressure.  He wondered if it could be chlorine- stopped swimming and didn't help. Doesn't seem to be related to food.   Saw dermatology Dr. Gilman Buttner off [address] and has been given a 15 course of prednisone with minimal relief.  A/P: I am uncertain what has caused this rash but he is already plugged into dermatology as well as allergist-I encouraged follow-up with both of them.  Had some success it sounds like with Claritin last year and also encouraged him to retrial that.  We discussed could be a medication or food issue-could trial elimination diet or we could trial off of medicines as able-atorvastatin is certainly needed for instance but is short.  Without it would be reasonable  # Superficial phlebitis S: Patient was being seen by Dr. Jimmie Molly on 7/5/24for vein restoration and they discovered a superficial phlebitis of the right calf.  Patient also recently had a macular pruritic rash.  There was no  evidence of DVT, gout, cellulitis.  Dr. Jimmie Molly recommended that due to patient having rash, unprovoked phlebitis and being over age 70 that he should have updated wellness workup including evaluation of colon cancer screening, prostate cancer screening, CBC, CMP, ESR/CRP and to follow-up with his cardiologist -he had labs on 03/10/23 and CBC, CMP noted as normal other than to bili stable. AST and ALT noted improved but don't see #s  -colonoscopy 2019 and was told 7 year repeat  A/P: Vascular team is concerned about his risk of cancer and request updates on all health maintenance items and baseline labs-has had CBC and CMP  with he will recently which were noted as normal other than bilirubin which has been chronically elevated. -He patient agrees to PSA updated today as well as ESR and CRP-he will go to lab for these -No specific treatment otherwise needed for the superficial thrombophlebitis at low risk of extension  # Prior back pain-Prior back pain better with core work in gym-congratulated patient on his efforts -With workouts does get   #CAD with angina controlled on Imdur  #hyperlipidemia-LDL goal under 70 S: Medication:Atorvastatin 80 mg, aspirin 81 mg, Brilinta 90 mg, Imdur 60 mg, nitroglycerin on hand but has not had to use  Current symptoms:Some pain on left ribs- wonders if muscular- never exertional and no chest pain or shortness of breath reported   A/P: For CAD appears asymptomatic-continue current medication For hyperlipidemia-Notes from [facility] simply state at goal for cholesterol-we will try to obtain the actual panel with LDL goal certainly under 70  #hypertension S: medication: Imdur 60 mg A/P: Well-controlled-continue current medication    # Hyperglycemia/insulin resistance/prediabetes-peak A1c 6.2  S:  Medication: none Exercise and diet-working with bariatrics at [facility]  Lab Results  Component Value Date   HGBA1C 6.0 03/10/2023   HGBA1C 5.9 02/06/2022   HGBA1C 6.2  08/07/2021  A/P: Thankfully stable recently-continue to monitor at least every 6 months   # B12 deficiency S: Current treatment/medication (oral vs. IM): Patient with low under 200 November 2021-treated with 4 weeks of B12 injections.  At present taking 1000 mcg a day-he had tried off this short-term to see if it would help with the rash but it did not A/P: He will noted B12 to be in the 200s-glad he has restarted his B12 supplement-consider repeat with her next set of labs  # Vitamin D-recently checked and adequate at 44.7  Recommended follow up: No follow-ups on file. Future Appointments  Date Time Provider Department Center  06/01/2023  8:00 AM Judi Saa, DO [facility] None  04/04/2024  8:45 AM [facility] ANNUAL WELLNESS VISIT 1 [facility]    Lab/Order associations: No diagnosis found.  No orders of the defined types were placed in this encounter.   Return precautions advised.  Tana Conch, MD

## 2023-04-23 NOTE — Telephone Encounter (Signed)
I reviewed concerns with patient today

## 2023-04-23 NOTE — Patient Instructions (Addendum)
Perhaps trial the claritin again  Could go back to allergist and ask about food testing  Lets have you see dermatology again  as well for next steps on rash but I will update bloodwork as well to look for any obvious signs of malignancy and you are up to date on colonoscopy thankfully  Please stop by lab before you go If you have mychart- we will send your results within 3 business days of [place] receiving them.  If you do not have mychart- we will call you about results within 5 business days of [place] receiving them.  *please also note that you will see labs on mychart as soon as they post. I will later go in and write notes on them- will say "notes from Dr. Durene Cal"   Recommended follow up: Return in about 6 months (around 10/24/2023) for followup or sooner if needed.Schedule b4 you leave.

## 2023-06-01 ENCOUNTER — Ambulatory Visit: Payer: Medicare Other | Admitting: Family Medicine

## 2023-06-14 ENCOUNTER — Other Ambulatory Visit: Payer: Self-pay | Admitting: Cardiology

## 2023-06-22 ENCOUNTER — Telehealth: Payer: Self-pay | Admitting: Cardiology

## 2023-06-22 MED ORDER — TICAGRELOR 60 MG PO TABS: 60.0000 mg | ORAL_TABLET | Freq: Two times a day (BID) | ORAL | 3 refills | Status: DC

## 2023-06-22 NOTE — Telephone Encounter (Signed)
Pt c/o medication issue:  1. Name of Medication:   ticagrelor (BRILINTA) 60 MG TABS tablet    2. How are you currently taking this medication (dosage and times per day)?   3. Are you having a reaction (difficulty breathing--STAT)?   4. What is your medication issue? Patient would like to know why his refill request for this medication was denied.

## 2023-06-22 NOTE — Telephone Encounter (Signed)
Refill sent to the pharmacy electronically.  

## 2023-06-22 NOTE — Telephone Encounter (Signed)
Patient asking for refill of Brilinta.  Have not filled here with [place].  Please advise and will send accordingly

## 2023-06-29 ENCOUNTER — Ambulatory Visit: Payer: Medicare Other | Admitting: Family Medicine

## 2023-07-10 NOTE — Progress Notes (Unsigned)
[name] [address] [place] [address] Phone: [phone] Subjective:   Michael [name], am serving as a scribe for Dr. Antoine Primas.  I'm seeing this patient by the request  of:  Shelva Majestic, MD  CC: Back and neck pain follow-up  [id number]  Michael [name] is a 67 y.o. male coming in with complaint of back and neck pain. OMT 03/17/2023. Patient states that his R knee is sore after exercise.   Also c/o L heel pain over achilles insertion. Was using Good Feet inserts. Added more cushion recently and this seemed to help somewhat.   Medications patient has been prescribed: None  Taking:         Reviewed prior external information including notes and imaging from previsou exam, outside providers and external EMR if available.   As well as notes that were available from care everywhere and other healthcare systems.  Past medical history, social, surgical and family history all reviewed in electronic medical record.  No pertanent information unless stated regarding to the chief complaint.   Past Medical History:  Diagnosis Date   Acute lateral meniscal tear, right, initial encounter 09/26/2021   Injection given September 26, 2018.   CAD S/P PCI to Cx & RCA    a. s/p PCI of LCx (2006) and ostium of RCA (2007) as well as DES to RCA in [place] (05/2014)   HTN (hypertension)    Hyperlipidemia    Plantar fasciitis 10/12/2017    No Known Allergies   Review of Systems:  No headache, visual changes, nausea, vomiting, diarrhea, constipation, dizziness, abdominal pain, skin rash, fevers, chills, night sweats, weight loss, swollen lymph nodes, body aches, joint swelling, chest pain, shortness of breath, mood changes. POSITIVE muscle aches  Objective  Blood pressure (!) 140/78, pulse 67, height 5\' 6"  (1.676 m), weight 192 lb (87.1 kg), SpO2 98%.   General: No apparent distress alert and oriented x3 mood and affect normal, dressed  appropriately.  HEENT: Pupils equal, extraocular movements intact  Respiratory: Patient's speak in full sentences and does not appear short of breath  Cardiovascular: No lower extremity edema, non tender, no erythema  Low back does have some loss lordosis noted.  Some tenderness to palpation in the paraspinal musculature.  Tightness in all range of motion's. Right knee does have some crepitus noted.  Some instability with valgus and varus force.  Osteopathic findings  C3 flexed rotated and side bent right C5 flexed rotated and side bent left T3 extended rotated and side bent right inhaled rib T9 extended rotated and side bent left L1 flexed rotated and side bent right Sacrum right on right    Assessment and Plan:  SI (sacroiliac) joint dysfunction Continues to be a chronic problem.  Discussed icing regimen and home exercises.  Increase activity slowly.  Discussed icing regimen.  Increase activity slowly.  Follow-up again in 6 to 8 weeks    Nonallopathic problems  Decision today to treat with OMT was based on Physical Exam  After verbal consent patient was treated with  ME, FPR techniques in cervical, rib, thoracic, lumbar, and sacral  areas  Patient tolerated the procedure well with improvement in symptoms  Patient given exercises, stretches and lifestyle modifications  See medications in patient instructions if given  Patient will follow up in 4-8 weeks    The above documentation has been reviewed and is accurate and complete Judi Saa, DO  Note: This dictation was prepared with Dragon dictation along with smaller phrase technology. Any transcriptional errors that result from this process are unintentional.

## 2023-07-13 ENCOUNTER — Ambulatory Visit (INDEPENDENT_AMBULATORY_CARE_PROVIDER_SITE_OTHER): Payer: Medicare Other | Admitting: Family Medicine

## 2023-07-13 ENCOUNTER — Encounter: Payer: Self-pay | Admitting: Family Medicine

## 2023-07-13 VITALS — BP 140/78 | HR 67 | Ht 66.0 in | Wt 192.0 lb

## 2023-07-13 DIAGNOSIS — M9903 Segmental and somatic dysfunction of lumbar region: Secondary | ICD-10-CM

## 2023-07-13 DIAGNOSIS — M9904 Segmental and somatic dysfunction of sacral region: Secondary | ICD-10-CM | POA: Diagnosis not present

## 2023-07-13 DIAGNOSIS — M9902 Segmental and somatic dysfunction of thoracic region: Secondary | ICD-10-CM

## 2023-07-13 DIAGNOSIS — M9901 Segmental and somatic dysfunction of cervical region: Secondary | ICD-10-CM | POA: Diagnosis not present

## 2023-07-13 DIAGNOSIS — M533 Sacrococcygeal disorders, not elsewhere classified: Secondary | ICD-10-CM | POA: Diagnosis not present

## 2023-07-13 DIAGNOSIS — M9908 Segmental and somatic dysfunction of rib cage: Secondary | ICD-10-CM | POA: Diagnosis not present

## 2023-07-13 DIAGNOSIS — M1711 Unilateral primary osteoarthritis, right knee: Secondary | ICD-10-CM

## 2023-07-13 DIAGNOSIS — M67874 Other specified disorders of tendon, left ankle and foot: Secondary | ICD-10-CM | POA: Insufficient documentation

## 2023-07-13 NOTE — Assessment & Plan Note (Signed)
Has responded to viscosupplementation a year and a half ago.  Would like to repeat.  Will try to get approval.  Hopefully patient will have significant improvement again.

## 2023-07-13 NOTE — Assessment & Plan Note (Signed)
Pneumatic compression sleeve given, discussed icing regimen and home exercises, discussed which activities to do and which ones to avoid.  Increase activity slowly otherwise.  I discussed proper shoes.  Worsening symptoms follow-up again 6 to 8 weeks.

## 2023-07-13 NOTE — Patient Instructions (Signed)
Will get gel approved Heel brace Achilles exercises See me in 6-8 weeks

## 2023-07-13 NOTE — Assessment & Plan Note (Signed)
Continues to be a chronic problem.  Discussed icing regimen and home exercises.  Increase activity slowly.  Discussed icing regimen.  Increase activity slowly.  Follow-up again in 6 to 8 weeks

## 2023-08-25 ENCOUNTER — Encounter: Payer: Self-pay | Admitting: Family Medicine

## 2023-08-25 ENCOUNTER — Ambulatory Visit: Payer: Medicare Other | Admitting: Family Medicine

## 2023-08-25 VITALS — BP 102/68 | HR 70 | Ht 66.0 in | Wt 192.0 lb

## 2023-08-25 DIAGNOSIS — M5010 Cervical disc disorder with radiculopathy, unspecified cervical region: Secondary | ICD-10-CM

## 2023-08-25 DIAGNOSIS — M9903 Segmental and somatic dysfunction of lumbar region: Secondary | ICD-10-CM | POA: Diagnosis not present

## 2023-08-25 DIAGNOSIS — M9902 Segmental and somatic dysfunction of thoracic region: Secondary | ICD-10-CM

## 2023-08-25 DIAGNOSIS — M9901 Segmental and somatic dysfunction of cervical region: Secondary | ICD-10-CM

## 2023-08-25 DIAGNOSIS — M9908 Segmental and somatic dysfunction of rib cage: Secondary | ICD-10-CM

## 2023-08-25 DIAGNOSIS — M9904 Segmental and somatic dysfunction of sacral region: Secondary | ICD-10-CM

## 2023-08-25 DIAGNOSIS — M7551 Bursitis of right shoulder: Secondary | ICD-10-CM

## 2023-08-25 NOTE — Patient Instructions (Addendum)
Injected shoulder  Exercises See me in 6-8 weeks

## 2023-08-25 NOTE — Assessment & Plan Note (Signed)
New exercises given and work with Event organiser, discussed icing regimen and home exercises, discussed which activities to do and which ones to avoid.  Increase activity slowly otherwise.  6 to 8 weeks.

## 2023-08-25 NOTE — Assessment & Plan Note (Signed)
Degenerative disc disease.  Does respond well to osteopathic manipulation.  Discussed icing regimen exercises, discussed which activities to do and which ones to avoid.  Increase activity slowly otherwise.  Follow-up with me again in 6 to 8 weeks

## 2023-08-25 NOTE — Progress Notes (Signed)
[name] [address] [place] [address] Phone: [phone] Subjective:   [name], am serving as a scribe for Dr. Antoine Primas.  I'm seeing this patient by the request  of:  Shelva Majestic, MD  CC: back and neck pain f/u   [id number]  Demeir Quattrone is a 67 y.o. male coming in with complaint of back and neck pain. OMT 07/13/2023. Patient states that his neck is sore. R shoulder pain in back of shoulder. Had injectoin years ago.   Medications patient has been prescribed: None  Taking:         Reviewed prior external information including notes and imaging from previsou exam, outside providers and external EMR if available.   As well as notes that were available from care everywhere and other healthcare systems.  Past medical history, social, surgical and family history all reviewed in electronic medical record.  No pertanent information unless stated regarding to the chief complaint.   Past Medical History:  Diagnosis Date   Acute lateral meniscal tear, right, initial encounter 09/26/2021   Injection given September 26, 2018.   CAD S/P PCI to Cx & RCA    a. s/p PCI of LCx (2006) and ostium of RCA (2007) as well as DES to RCA in [place] (05/2014)   HTN (hypertension)    Hyperlipidemia    Plantar fasciitis 10/12/2017    No Known Allergies   Review of Systems:  No headache, visual changes, nausea, vomiting, diarrhea, constipation, dizziness, abdominal pain, skin rash, fevers, chills, night sweats, weight loss, swollen lymph nodes, body aches, joint swelling, chest pain, shortness of breath, mood changes. POSITIVE muscle aches  Objective  Blood pressure 102/68, pulse 70, height 5\' 6"  (1.676 m), weight 192 lb (87.1 kg), SpO2 100%.   General: No apparent distress alert and oriented x3 mood and affect normal, dressed appropriately.  HEENT: Pupils equal, extraocular movements intact  Respiratory: Patient's speak in  full sentences and does not appear short of breath  Cardiovascular: No lower extremity edema, non tender, no erythema  Gait MSK:  Back does have loss of lordosis.  Tenderness to palpation paraspinal musculature.  Patient does have loss of lordosis of the lower back.  Neck exam also has significant tenderness.  Right shoulder does have positive impingement noted.  Negative straight leg test.  Negative Spurling's.  Osteopathic findings  C2 flexed rotated and side bent right C6 flexed rotated and side bent left T3 extended rotated and side bent right inhaled rib T9 extended rotated and side bent left L2 flexed rotated and side bent right Sacrum right on right  After informed written and verbal consent, patient was seated on exam table. Right shoulder was prepped with alcohol swab and utilizing posterior approach, patient's right glenohumeral space was injected with 4:1  marcaine 0.5%: Kenalog 40mg /dL. Patient tolerated the procedure well without immediate complications.  [id number]; 15 additional minutes spent for Therapeutic exercises as stated in above notes.  This included exercises focusing on stretching, strengthening, with significant focus on eccentric aspects.   Long term goals include an improvement in range of motion, strength, endurance as well as avoiding reinjury. Patient's frequency would include in 1-2 times a day, 3-5 times a week for a duration of 6-12 weeks.  Shoulder Exercises that included:  Basic scapular stabilization to include adduction and depression of scapula Scaption, focusing on proper movement and good control Internal and External rotation utilizing a theraband, with elbow tucked at side  entire time Rows with theraband  Proper technique shown and discussed handout in great detail with ATC.  All questions were discussed and answered.      Assessment and Plan:  Cervical disc disorder with radiculopathy of cervical region Degenerative disc disease.  Does respond well  to osteopathic manipulation.  Discussed icing regimen exercises, discussed which activities to do and which ones to avoid.  Increase activity slowly otherwise.  Follow-up with me again in 6 to 8 weeks  Acute bursitis of right shoulder New exercises given and work with Event organiser, discussed icing regimen and home exercises, discussed which activities to do and which ones to avoid.  Increase activity slowly otherwise.  6 to 8 weeks.    Nonallopathic problems  Decision today to treat with OMT was based on Physical Exam  After verbal consent patient was treated with HVLA, ME, FPR techniques in cervical, rib, thoracic, lumbar, and sacral  areas  Patient tolerated the procedure well with improvement in symptoms  Patient given exercises, stretches and lifestyle modifications  See medications in patient instructions if given  Patient will follow up in 4-8 weeks    The above documentation has been reviewed and is accurate and complete Judi Saa, DO          Note: This dictation was prepared with Dragon dictation along with smaller phrase technology. Any transcriptional errors that result from this process are unintentional.

## 2023-10-01 ENCOUNTER — Encounter (HOSPITAL_COMMUNITY): Payer: Self-pay | Admitting: Cardiology

## 2023-10-01 ENCOUNTER — Other Ambulatory Visit: Payer: Self-pay

## 2023-10-01 ENCOUNTER — Emergency Department (HOSPITAL_COMMUNITY): Payer: Medicare Other

## 2023-10-01 ENCOUNTER — Observation Stay (HOSPITAL_COMMUNITY)
Admission: EM | Admit: 2023-10-01 | Discharge: 2023-10-03 | Disposition: A | Discharge status: Home / Self Care | Payer: Medicare Other | Attending: Cardiology | Admitting: Cardiology

## 2023-10-01 DIAGNOSIS — E782 Mixed hyperlipidemia: Secondary | ICD-10-CM

## 2023-10-01 DIAGNOSIS — R079 Chest pain, unspecified: Principal | ICD-10-CM | POA: Diagnosis present

## 2023-10-01 DIAGNOSIS — Z79899 Other long term (current) drug therapy: Secondary | ICD-10-CM | POA: Insufficient documentation

## 2023-10-01 DIAGNOSIS — I10 Essential (primary) hypertension: Secondary | ICD-10-CM | POA: Diagnosis not present

## 2023-10-01 DIAGNOSIS — I200 Unstable angina: Secondary | ICD-10-CM | POA: Diagnosis present

## 2023-10-01 DIAGNOSIS — I255 Ischemic cardiomyopathy: Secondary | ICD-10-CM | POA: Diagnosis not present

## 2023-10-01 DIAGNOSIS — I2510 Atherosclerotic heart disease of native coronary artery without angina pectoris: Secondary | ICD-10-CM

## 2023-10-01 DIAGNOSIS — I25110 Atherosclerotic heart disease of native coronary artery with unstable angina pectoris: Principal | ICD-10-CM

## 2023-10-01 DIAGNOSIS — Z7982 Long term (current) use of aspirin: Secondary | ICD-10-CM | POA: Diagnosis not present

## 2023-10-01 DIAGNOSIS — Z955 Presence of coronary angioplasty implant and graft: Secondary | ICD-10-CM

## 2023-10-01 LAB — CBC
HCT: 42.1 % (ref 39.0–52.0)
HCT: 41.6 % (ref 39.0–52.0)
Hemoglobin: 14.6 g/dL (ref 13.0–17.0)
Hemoglobin: 14.6 g/dL (ref 13.0–17.0)
MCH: 31.9 pg (ref 26.0–34.0)
MCH: 31.9 pg (ref 26.0–34.0)
MCHC: 34.7 g/dL (ref 30.0–36.0)
MCHC: 35.1 g/dL (ref 30.0–36.0)
MCV: 91.9 fL (ref 80.0–100.0)
MCV: 90.8 fL (ref 80.0–100.0)
Platelets: 230 10*3/uL (ref 150–400)
Platelets: 216 10*3/uL (ref 150–400)
RBC: 4.58 MIL/uL (ref 4.22–5.81)
RBC: 4.58 MIL/uL (ref 4.22–5.81)
RDW: 13.2 % (ref 11.5–15.5)
RDW: 13 % (ref 11.5–15.5)
WBC: 7 10*3/uL (ref 4.0–10.5)
WBC: 7.6 10*3/uL (ref 4.0–10.5)
nRBC: 0 % (ref 0.0–0.2)
nRBC: 0 % (ref 0.0–0.2)

## 2023-10-01 LAB — BASIC METABOLIC PANEL
Anion gap: 12 (ref 5–15)
BUN: 19 mg/dL (ref 8–23)
CO2: 24 mmol/L (ref 22–32)
Calcium: 8.9 mg/dL (ref 8.9–10.3)
Chloride: 103 mmol/L (ref 98–111)
Creatinine, Ser: 0.89 mg/dL (ref 0.61–1.24)
GFR, Estimated: >60 mL/min (ref >60)
Glucose, Bld: 115 mg/dL — ABNORMAL HIGH (ref 70–99)
Potassium: 3.9 mmol/L (ref 3.5–5.1)
Sodium: 139 mmol/L (ref 135–145)

## 2023-10-01 LAB — CREATININE, SERUM
Creatinine, Ser: 0.92 mg/dL (ref 0.61–1.24)
GFR, Estimated: >60 mL/min (ref >60)

## 2023-10-01 LAB — HEMOGLOBIN A1C
Hgb A1c MFr Bld: 6.2 % — ABNORMAL HIGH (ref 4.8–5.6)
Mean Plasma Glucose: 131.24 mg/dL

## 2023-10-01 LAB — TROPONIN I (HIGH SENSITIVITY)
Troponin I (High Sensitivity): 6 ng/L (ref <18)
Troponin I (High Sensitivity): 5 ng/L (ref <18)

## 2023-10-01 LAB — MAGNESIUM: Magnesium: 1.8 mg/dL (ref 1.7–2.4)

## 2023-10-01 LAB — PROTIME-INR
INR: 1.1 (ref 0.8–1.2)
Prothrombin Time: 14 s (ref 11.4–15.2)

## 2023-10-01 MED ORDER — TICAGRELOR 60 MG PO TABS
60.0000 mg | ORAL_TABLET | Freq: Two times a day (BID) | ORAL | Status: DC
  Administered 2023-10-01 – 2023-10-03 (×4): 60 mg via ORAL
  Filled 2023-10-01 (×5): qty 1

## 2023-10-01 MED ORDER — ASPIRIN 325 MG PO TBEC
325.0000 mg | DELAYED_RELEASE_TABLET | Freq: Once | ORAL | Status: AC
  Administered 2023-10-01: 325 mg via ORAL
  Filled 2023-10-01: qty 1

## 2023-10-01 MED ORDER — ASPIRIN 81 MG PO TBEC
81.0000 mg | DELAYED_RELEASE_TABLET | Freq: Every day | ORAL | Status: DC
  Administered 2023-10-03: 81 mg via ORAL
  Filled 2023-10-01 (×2): qty 1

## 2023-10-01 MED ORDER — HEPARIN SODIUM (PORCINE) 5000 UNIT/ML IJ SOLN
5000.0000 [IU] | Freq: Three times a day (TID) | INTRAMUSCULAR | Status: DC
  Administered 2023-10-01 – 2023-10-03 (×5): 5000 [IU] via SUBCUTANEOUS
  Filled 2023-10-01 (×5): qty 1

## 2023-10-01 MED ORDER — ISOSORBIDE MONONITRATE ER 60 MG PO TB24
60.0000 mg | ORAL_TABLET | Freq: Every day | ORAL | Status: DC
  Administered 2023-10-02 – 2023-10-03 (×2): 60 mg via ORAL
  Filled 2023-10-01: qty 2
  Filled 2023-10-01: qty 1
  Filled 2023-10-01: qty 2

## 2023-10-01 MED ORDER — ONDANSETRON HCL 4 MG/2ML IJ SOLN: 4.0000 mg | Freq: Four times a day (QID) | INTRAMUSCULAR | Status: DC | PRN

## 2023-10-01 MED ORDER — NITROGLYCERIN 0.4 MG SL SUBL: 0.4000 mg | SUBLINGUAL_TABLET | SUBLINGUAL | Status: DC | PRN

## 2023-10-01 MED ORDER — ACETAMINOPHEN 325 MG PO TABS
650.0000 mg | ORAL_TABLET | ORAL | Status: DC | PRN
  Administered 2023-10-02: 650 mg via ORAL
  Filled 2023-10-01: qty 2

## 2023-10-01 NOTE — ED Provider Triage Note (Signed)
 Emergency Medicine Provider Triage Evaluation Note  Michael [name] , a 68 y.o. male  was evaluated in triage.  Pt complains of intermittent chest pain since last week worse with exertion.  Does have history of CAD and status post PCI.  Also endorses some lightheadedness and nausea along with the episodes.  Review of Systems  Positive: As above Negative: As above  Physical Exam  BP 106/71 (BP Location: Right Arm)   Pulse 82   Temp (!) 97.3 F (36.3 C)   Resp 18   SpO2 100%  Gen:   Awake, no distress   Resp:  Normal effort  MSK:   Moves extremities without difficulty Other:    Medical Decision Making  Medically screening exam initiated at 12:23 PM.  Appropriate orders placed.  Michael [name] was informed that the remainder of the evaluation will be completed by another provider, this initial triage assessment does not replace that evaluation, and the importance of remaining in the ED until their evaluation is complete.     Hildegard Loge, PA-C 10/01/23 1224

## 2023-10-01 NOTE — ED Provider Notes (Signed)
 [place] EMERGENCY DEPARTMENT AT [facility] Provider Note   CSN: [id number] Arrival date & time: 10/01/23  1149     History  Chief Complaint  Patient presents with   Chest Pain    Michael [name] is a 68 y.o. male.  68 yo M with a chief complaint of chest pain.  Worse with exertion and better with rest.  Has been going on for couple weeks.  He has a history of an RCA occlusion with stent placement.  He does not think it feels quite the same.  He has had some nausea and dizziness with this 1 which is not typical.  No pain now.   Chest Pain      Home Medications Prior to Admission medications   Medication Sig Start Date End Date Taking? Authorizing Provider  aspirin  81 MG tablet Take 81 mg by mouth daily.    [provider]  atorvastatin  (LIPITOR ) 80 MG tablet TAKE 1 TABLET DAILY 06/16/22   Lavona Agent, MD  CHERRY PO Take 50 mg by mouth daily. Cherry extract    [provider]  Cinnamon 500 MG capsule Take 500 mg by mouth daily.    [provider]  Coenzyme Q10 (Q-SORB CO Q-10) 100 MG capsule Take 1 capsule by mouth daily.    [provider]  EPINEPHrine 0.3 mg/0.3 mL IJ SOAJ injection See admin instructions. 04/25/21   [provider]  famotidine  (PEPCID ) 40 MG tablet Take 1 tablet (40 mg total) by mouth daily. 07/15/22   [name], MD  hydrocortisone  (ANUSOL -HC) 2.5 % rectal cream PLACE 1 APPLICATION RECTALLY 2 (TWO) TIMES DAILY. PATIENT NEEDS OFFICE VISIT. 02/26/23   Zehr, Jessica D, PA-C  isosorbide  mononitrate (IMDUR ) 60 MG 24 hr tablet TAKE 1 TABLET DAILY 08/27/22   Lavona Agent, MD  nitroGLYCERIN  (NITROSTAT ) 0.4 MG SL tablet DISSOLVE 1 TABLET UNDER THE TONGUE EVERY 5 MINUTES AS NEEDED FOR CHEST PAIN UP TO 3 DOSES 11/28/19   Lavona Agent, MD  ticagrelor  (BRILINTA ) 60 MG TABS tablet Take 1 tablet (60 mg total) by mouth 2 (two) times daily. 06/22/23   Lavona Agent, MD  Turmeric 500 MG CAPS Take 500 mg by  mouth daily.    [provider]  Vitamin D , Ergocalciferol , (DRISDOL ) 1.25 MG (50000 UNIT) CAPS capsule TAKE 1 CAPSULE EVERY 7 DAYS 09/20/22   Claudene Arthea HERO, DO      Allergies    Patient has no known allergies.    Review of Systems   Review of Systems  Cardiovascular:  Positive for chest pain.    Physical Exam Updated Vital Signs BP 108/71 (BP Location: Right Arm)   Pulse 70   Temp (!) 97.3 F (36.3 C)   Resp 16   Ht 5' 6 (1.676 m)   Wt 87.1 kg   SpO2 100%   BMI 30.99 kg/m  Physical Exam Vitals and nursing note reviewed.  Constitutional:      Appearance: He is well-developed.  HENT:     Head: Normocephalic and atraumatic.  Eyes:     Pupils: Pupils are equal, round, and reactive to light.  Neck:     Vascular: No JVD.  Cardiovascular:     Rate and Rhythm: Normal rate and regular rhythm.     Heart sounds: No murmur heard.    No friction rub. No gallop.  Pulmonary:     Effort: No respiratory distress.     Breath sounds: No wheezing.  Abdominal:  General: There is no distension.     Tenderness: There is no abdominal tenderness. There is no guarding or rebound.  Musculoskeletal:        General: Normal range of motion.     Cervical back: Normal range of motion and neck supple.  Skin:    Coloration: Skin is not pale.     Findings: No rash.  Neurological:     Mental Status: He is alert and oriented to person, place, and time.  Psychiatric:        Behavior: Behavior normal.     ED Results / Procedures / Treatments   Labs (all labs ordered are listed, but only abnormal results are displayed) Labs Reviewed  BASIC METABOLIC PANEL - Abnormal; Notable for the following components:      Result Value   Glucose, Bld 115 (*)    All other components within normal limits  CBC  MAGNESIUM  TROPONIN I (HIGH SENSITIVITY)  TROPONIN I (HIGH SENSITIVITY)    EKG EKG Interpretation Date/Time:  Thursday October 01 2023 11:55:58 EST Ventricular Rate:  81 PR  Interval:  154 QRS Duration:  96 QT Interval:  370 QTC Calculation: 429 R Axis:   70  Text Interpretation: Normal sinus rhythm Nonspecific ST and T wave abnormality Abnormal ECG No significant change since last tracing Confirmed by Emil Share [phone]) on 10/01/2023 1:23:16 PM  Radiology DG Chest 2 View Result Date: 10/01/2023 CLINICAL DATA:  Left-sided chest pain and nausea. Previous myocardial infarct. EXAM: CHEST - 2 VIEW COMPARISON:  01/15/2022 FINDINGS: The heart size and mediastinal contours are within normal limits. Both lungs are clear. The visualized skeletal structures are unremarkable. IMPRESSION: No active cardiopulmonary disease. Electronically Signed   By: Norleen DELENA Kil M.D.   On: 10/01/2023 12:52    Procedures Procedures    Medications Ordered in ED Medications  aspirin  EC tablet 325 mg (325 mg Oral Given 10/01/23 1229)    ED Course/ Medical Decision Making/ A&P                                 Medical Decision Making Amount and/or Complexity of Data Reviewed Labs: ordered. Radiology: ordered.   68 yo M with a chief complaint of chest pain.  Typical in nature.  History of ACS in the past.  2 troponins are negative.  No acute anemia.  Chest x-ray independently interpreted by me without focal infiltrate or pneumothorax.  EKG without obvious ischemic findings.  Discussed case with cardiology who evaluated bedside.  Patient care was signed out to Dr. Darra, please see his note for further details care in the ED.  The patients results and plan were reviewed and discussed.   Any x-rays performed were independently reviewed by myself.   Differential diagnosis were considered with the presenting HPI.  Medications  aspirin  EC tablet 325 mg (325 mg Oral Given 10/01/23 1229)    Vitals:   10/01/23 1153 10/01/23 1336  BP: 106/71 108/71  Pulse: 82 70  Resp: 18 16  Temp: (!) 97.3 F (36.3 C)   SpO2: 100% 100%  Weight:  87.1 kg  Height:  5' 6 (1.676 m)    Final diagnoses:   Chest pain with high risk for cardiac etiology    Admission/ observation were discussed with the admitting physician, patient and/or family and they are comfortable with the plan.          Final Clinical Impression(s) /  ED Diagnoses Final diagnoses:  Chest pain with high risk for cardiac etiology    Rx / DC Orders ED Discharge Orders     None         Emil Share, DO 10/01/23 1522

## 2023-10-01 NOTE — H&P (Addendum)
 Cardiology Admission History and Physical   Patient ID: Michael [name] MRN: [id number]; DOB: [birth year]   Admission date: 10/01/2023  PCP:  [name], MD   [facility] Providers Cardiologist:  Lynwood Schilling, MD   {  Chief Complaint:  chest pain  Patient Profile:   Michael [name] is a 68 y.o. male with PMH of CAD s/p multiple stents, HTN, HLD, who is being seen 10/01/2023 for the evaluation of chest pain.  History of Present Illness:   Michael [name] with above PMH presented to ER today for chest pain.   He follows Dr [name] for CAD. He has received multiple cardiac stents in the past to RCA, Lcx, LAD. Most recent heart cath was done 05/13/16 for chest pain following 2 overlapping stents palcement to mid LAD on 05/06/16. This revealed all patent stents in mid Lcx, ostial RCA, mid/distal RCA, and 2 overlapping DES in mid LAD. He did have 50% ost RPDA, 40% mid Cx, and 70% Diag 2 that were managed medically. He was concerned for coronary spasms and imdur  was started. He was readmitted 12/16/2018 with chest pain. CTA of the chest was negative for PE. Myoview  12/17/2018 was intermediate risk study, finding showed large defect of severe severity present in the inferolateral wall consistent with scar, EF 38% with inferior anterolateral akinesis. Echo from 12/17/18 showed EF 40 to 45%, severe akinesis of the entire inferior wall. Repeat Echo on 12/18/21 showed LVEF 53%, global hypokinesis, mild asymmetric LVH, grade I DD, normal RV, mild MR. He was last seen by Dr [name] 12/23/22, doing well, tolerating swimming. He was told to continue ASA 81mg , Brilinta  60mg  BID, lipitor  80mg , and Imdur  60mg  for medical therapy.   He states he has been doing fairly well over the years. He noted new onset of chest tightness last Tuesday. Pain is located at the left side of the chest. Pain occurs after he exercise/exertion. It's associated with mild lightheadedness, nausea, and shortness of breath.  Symptoms typically resolves when he stops exercise. This felt unusual for him and is different than his symptoms before his previous MI. He has no pain today. This has occurred twice last week.  And last episode earlier today.  He denied any resting SOB, rapid weight gain, leg edema, orthopnea. He is concerned about his heart and therefore came to ER today. He has been complaint with his medication. He does not check BP at home.   Labs today showed unremarkable BMP and CBC. Hs trop 6>5. CXR showed no acute findings. EKG showed sinus rhythm 81 bpm, non-specific T flattening of inferolateral leads. Cardiology is consulted today for further evaluation of chest pain. He was given full dose ASA at ED.  Past Medical History:  Diagnosis Date   Acute lateral meniscal tear, right, initial encounter 09/26/2021   Injection given September 26, 2018.   CAD S/P PCI to Cx & RCA    a. s/p PCI of LCx (2006) and ostium of RCA (2007) as well as DES to RCA in [place] (05/2014)   HTN (hypertension)    Hyperlipidemia    Plantar fasciitis 10/12/2017    Past Surgical History:  Procedure Laterality Date   CARDIAC CATHETERIZATION N/A 05/06/2016   Procedure: Left Heart Cath and Coronary Angiography;  Surgeon: Lonni Hanson, MD;  Location: [facility] INVASIVE CV LAB;  Service: Cardiovascular;  Laterality: N/A;   CARDIAC CATHETERIZATION N/A 05/06/2016   Procedure: Coronary Stent Intervention;  Surgeon: Lonni Hanson, MD;  Location: [facility] INVASIVE CV LAB;  Service: Cardiovascular;  Laterality: N/A;   CARDIAC CATHETERIZATION N/A 05/13/2016   Procedure: Coronary/Graft Angiography;  Surgeon: Peter M Jordan, MD;  Location: [facility] INVASIVE CV LAB;  Service: Cardiovascular;  Laterality: N/A;   CORONARY ANGIOPLASTY WITH STENT PLACEMENT  2006   in [place] , occluded CFX, s/p Taxus stent   CORONARY ANGIOPLASTY WITH STENT PLACEMENT  2007   in [place] , ostial RCA stent   CORONARY ANGIOPLASTY WITH STENT PLACEMENT  2015   in [place] ,  RCA stent   TONSILLECTOMY AND ADENOIDECTOMY        Medications Prior to Admission: Prior to Admission medications   Medication Sig Start Date End Date Taking? Authorizing Provider  aspirin  81 MG tablet Take 81 mg by mouth daily.    [provider]  atorvastatin  (LIPITOR ) 80 MG tablet TAKE 1 TABLET DAILY 06/16/22   Lavona Agent, MD  CHERRY PO Take 50 mg by mouth daily. Cherry extract    [provider]  Cinnamon 500 MG capsule Take 500 mg by mouth daily.    [provider]  Coenzyme Q10 (Q-SORB CO Q-10) 100 MG capsule Take 1 capsule by mouth daily.    [provider]  EPINEPHrine 0.3 mg/0.3 mL IJ SOAJ injection See admin instructions. 04/25/21   [provider]  famotidine  (PEPCID ) 40 MG tablet Take 1 tablet (40 mg total) by mouth daily. 07/15/22   [name], MD  hydrocortisone  (ANUSOL -HC) 2.5 % rectal cream PLACE 1 APPLICATION RECTALLY 2 (TWO) TIMES DAILY. PATIENT NEEDS OFFICE VISIT. 02/26/23   Zehr, Jessica D, PA-C  isosorbide  mononitrate (IMDUR ) 60 MG 24 hr tablet TAKE 1 TABLET DAILY 08/27/22   Lavona Agent, MD  nitroGLYCERIN  (NITROSTAT ) 0.4 MG SL tablet DISSOLVE 1 TABLET UNDER THE TONGUE EVERY 5 MINUTES AS NEEDED FOR CHEST PAIN UP TO 3 DOSES 11/28/19   Lavona Agent, MD  ticagrelor  (BRILINTA ) 60 MG TABS tablet Take 1 tablet (60 mg total) by mouth 2 (two) times daily. 06/22/23   Lavona Agent, MD  Turmeric 500 MG CAPS Take 500 mg by mouth daily.    [provider]  Vitamin D , Ergocalciferol , (DRISDOL ) 1.25 MG (50000 UNIT) CAPS capsule TAKE 1 CAPSULE EVERY 7 DAYS 09/20/22   Smith, Zachary M, DO     Allergies:   No Known Allergies  Social History:   Social History   Socioeconomic History   Marital status: Married    Spouse name: Not on file   Number of children: 4   Years of education: Not on file   Highest education level: Not on file  Occupational History   Occupation: retired  Tobacco Use   Smoking status: Never    Smokeless tobacco: Never  Vaping Use   Vaping status: Never Used  Substance and Sexual Activity   Alcohol use: No    Alcohol/week: 0.0 standard drinks of alcohol   Drug use: No   Sexual activity: Yes  Other Topics Concern   Not on file  Social History Narrative   Lives with wife.  4 kids total (2 step, 2 biological). 1 that has CP. Daughter Elvie Hsu- comes to [facility]).        Retired Water Quality Scientist.  Oldest daughter has CP      Hobbies: gardening, lawn care, care for grandkids   Social Drivers of Health   Financial Resource Strain: Low Risk  (04/02/2023)   Overall Financial Resource Strain (CARDIA)    Difficulty of Paying Living Expenses: Not hard at all  Food Insecurity:  No Food Insecurity (04/02/2023)   Hunger Vital Sign    Worried About Running Out of Food in the Last Year: Never true    Ran Out of Food in the Last Year: Never true  Transportation Needs: No Transportation Needs (04/02/2023)   PRAPARE - Administrator, Civil Service (Medical): No    Lack of Transportation (Non-Medical): No  Physical Activity: Sufficiently Active (04/02/2023)   Exercise Vital Sign    Days of Exercise per Week: 2 days    Minutes of Exercise per Session: 90 min  Stress: No Stress Concern Present (04/02/2023)   [employer] of Occupational Health - Occupational Stress Questionnaire    Feeling of Stress : Not at all  Social Connections: Moderately Integrated (04/02/2023)   Social Connection and Isolation Panel [NHANES]    Frequency of Communication with Friends and Family: More than three times a week    Frequency of Social Gatherings with Friends and Family: More than three times a week    Attends Religious Services: More than 4 times per year    Active Member of [employer] or Organizations: No    Attends Banker Meetings: Never    Marital Status: Married  Catering Manager Violence: Not At Risk (04/02/2023)   Humiliation, Afraid, Rape, and Kick questionnaire     Fear of Current or Ex-Partner: No    Emotionally Abused: No    Physically Abused: No    Sexually Abused: No    Family History:   The patient's family history includes Cancer in his brother and brother; Cerebral palsy in his daughter; Dementia in his father; Heart disease (age of onset: 27) in his maternal uncle; Heart disease (age of onset: 65) in his maternal grandfather; Hyperlipidemia in his sister; Hypertension in his father; Stroke in his son. There is no history of Colon cancer, Esophageal cancer, Pancreatic cancer, Stomach cancer, or Liver disease.    Review of Systems  Cardiovascular:  Positive for chest pain and dyspnea on exertion. Negative for claudication, irregular heartbeat, leg swelling, near-syncope, orthopnea, palpitations, paroxysmal nocturnal dyspnea and syncope.  Respiratory:  Positive for shortness of breath.   Hematologic/Lymphatic: Negative for bleeding problem.  Neurological:  Positive for light-headedness.   Physical Exam/Data:   Vitals:   10/01/23 1153 10/01/23 1336  BP: 106/71 108/71  Pulse: 82 70  Resp: 18 16  Temp: (!) 97.3 F (36.3 C)   SpO2: 100% 100%  Weight:  87.1 kg  Height:  5' 6 (1.676 m)   No intake or output data in the 24 hours ending 10/01/23 1600    10/01/2023    1:36 PM 08/25/2023    9:23 AM 07/13/2023    9:21 AM  Last 3 Weights  Weight (lbs) 192 lb 192 lb 192 lb  Weight (kg) 87.091 kg 87.091 kg 87.091 kg     Body mass index is 30.99 kg/m.   Vitals:  Vitals:   10/01/23 1153 10/01/23 1336  BP: 106/71 108/71  Pulse: 82 70  Resp: 18 16  Temp: (!) 97.3 F (36.3 C)   SpO2: 100% 100%   General Appearance: In no apparent distress, laying in bed HEENT: Normocephalic, atraumatic.  Neck: Supple, trachea midline, no JVDs Cardiovascular: Regular rate and rhythm, normal S1-S2,  no murmur Respiratory: Resting breathing unlabored, lungs sounds clear to auscultation bilaterally, no use of accessory muscles. On room air.  No wheezes, rales  or rhonchi.   Gastrointestinal: Bowel sounds positive, abdomen soft, non-tender, non-distended. Extremities: Able to  move all extremities in bed without difficulty, no edema of BLE Musculoskeletal: Normal muscle bulk and tone Skin: Intact, warm, dry.  Neurologic: Alert, oriented to person, place and time. no gross focal neuro deficit Psychiatric: Normal affect. Mood is appropriate.    EKG:    EKG today showed sinus rhythm 81 bpm, non-specific T flattening of inferolateral leads, no acute changes.   Relevant CV Studies:   Echo from 12/18/21:  1. The left ventricle has low normal function. The left ventricle  demonstrates global hypokinesis. There is mild asymmetric left ventricular  hypertrophy of the septal segment. Left ventricular diastolic parameters  are consistent with Grade I diastolic  dysfunction (impaired relaxation).   2. Right ventricular systolic function is normal. The right ventricular  size is normal.   3. The mitral valve is normal in structure. Mild mitral valve  regurgitation. No evidence of mitral stenosis.   4. The aortic valve is tricuspid. Aortic valve regurgitation is not  visualized. No aortic stenosis is present.   5. The inferior vena cava is normal in size with greater than 50%  respiratory variability, suggesting right atrial pressure of 3 mmHg.   Comparison(s): A prior study was performed on 11/2018. Changes from prior  study are noted. LVEF was 40-45% now 53%.    Laboratory Data:  High Sensitivity Troponin:   Recent Labs  Lab 10/01/23 1231 10/01/23 1413  TROPONINIHS 6 5      Chemistry Recent Labs  Lab 10/01/23 1231  NA 139  K 3.9  CL 103  CO2 24  GLUCOSE 115*  BUN 19  CREATININE 0.89  CALCIUM  8.9  MG 1.8  GFRNONAA >60  ANIONGAP 12    No results for input(s): PROT, ALBUMIN, AST, ALT, ALKPHOS, BILITOT in the last 168 hours. Lipids No results for input(s): CHOL, TRIG, HDL, LABVLDL, LDLCALC, CHOLHDL in the  last 168 hours. Hematology Recent Labs  Lab 10/01/23 1231  WBC 7.0  RBC 4.58  HGB 14.6  HCT 42.1  MCV 91.9  MCH 31.9  MCHC 34.7  RDW 13.2  PLT 230   Thyroid  No results for input(s): TSH, FREET4 in the last 168 hours. BNPNo results for input(s): BNP, PROBNP in the last 168 hours.  DDimer No results for input(s): DDIMER in the last 168 hours.   Radiology/Studies:  DG Chest 2 View Result Date: 10/01/2023 CLINICAL DATA:  Left-sided chest pain and nausea. Previous myocardial infarct. EXAM: CHEST - 2 VIEW COMPARISON:  01/15/2022 FINDINGS: The heart size and mediastinal contours are within normal limits. Both lungs are clear. The visualized skeletal structures are unremarkable. IMPRESSION: No active cardiopulmonary disease. Electronically Signed   By: Norleen DELENA Kil M.D.   On: 10/01/2023 12:52     Assessment and Plan:   Chest pain  Multivessel CAD with multiple stents of RCA/Lcx/LAD last 2017 - presented with exertional angina resolves by resting  - Hs trop negative x2 - EKG no acute finding - Will admit to telemetry unit - will update Echo, A1C, lipid panel  - Recommend further evaluation with Stress Myoview  versus LHC, given high probability of ischemia, Dr Michele recommend LHC tomorrow,  NPO after midnight (see attending addendum for consent) - Medical therapy: continue PTA ASA 81mg , Brilinta  60mg  BID, lipitor  80mg , and Imdur  60mg ; if chest pain recurs, would start heparin  gtt   HTN - BP controlled on low dose Imdur , continue   HLD - continue lipitor  80mg , update lipid panel   DVT prophylaxis -start SQ heparin    AD:  Full code, Wife [name] is power of attorney, confirmed with the patient   Dispo: likely in 24 hours to home    Risk Assessment/Risk Scores:    Code Status: Full Code  Severity of Illness: The appropriate patient status for this patient is OBSERVATION. Observation status is judged to be reasonable and necessary in order to provide the required  intensity of service to ensure the patient's safety. The patient's presenting symptoms, physical exam findings, and initial radiographic and laboratory data in the context of their medical condition is felt to place them at decreased risk for further clinical deterioration. Furthermore, it is anticipated that the patient will be medically stable for discharge from the hospital within 2 midnights of admission.    For questions or updates, please contact [facility] Please consult [email or web address] for contact info under     Signed, Xika Zhao, NP  10/01/2023 4:00 PM   ADDENDUM:   Patient seen and examined with Xika Zhao, NP .  I personally taken a history, examined the patient, reviewed relevant notes,  laboratory data / imaging studies.  I performed a substantive portion of this encounter and formulated the important aspects of the plan.  I agree with the APP's note, impression, and recommendations; however, I have edited the note to reflect changes or salient points.   Patient seen and examined independently at bedside. Accompanied by his wife. He presents to the hospital with chest pain located left-sided/substernal, pronounced with effort related activities such as grocery shopping, improves with resting Symptoms started last week Tuesday and has been having intermittent episodes. Last episode was earlier today and therefore given his cardiovascular history presents to the ED for further evaluation and management. Currently no active chest pain He has used sublingual nitroglycerin  tablets and has noted no significant improvement-likely took it too late after the symptoms already has started to subside.  PHYSICAL EXAM: Today's Vitals   10/01/23 1153 10/01/23 1208 10/01/23 1336 10/01/23 1624  BP: 106/71  108/71   Pulse: 82  70   Resp: 18  16   Temp: (!) 97.3 F (36.3 C)   97.7 F (36.5 C)  TempSrc:    Oral  SpO2: 100%  100%   Weight:   87.1 kg   Height:   5' 6 (1.676 m)    PainSc:  3  1     Body mass index is 30.99 kg/m.   Net IO Since Admission: No IO data has been entered for this period [10/01/23 1635]  Filed Weights   10/01/23 1336  Weight: 87.1 kg    Physical Exam  Constitutional: No distress.  hemodynamically stable  Neck: No JVD present.  Cardiovascular: Normal rate, regular rhythm, S1 normal and S2 normal. Exam reveals no gallop, no S3 and no S4.  No murmur heard. Pulses:      Radial pulses are 1+ on the right side and 2+ on the left side.       Dorsalis pedis pulses are 2+ on the right side and 2+ on the left side.       Posterior tibial pulses are 2+ on the right side and 2+ on the left side.  Pulmonary/Chest: Effort normal and breath sounds normal. No stridor. He has no wheezes. He has no rales.  Abdominal: Soft. Bowel sounds are normal. He exhibits no distension. There is no abdominal tenderness.  Musculoskeletal:        General: No edema.     Cervical back: Neck supple.  Neurological: He  is alert and oriented to person, place, and time. He has intact cranial nerves (2-12).  Skin: Skin is warm.    EKG: (personally reviewed by me) 10/01/2023: Sinus rhythm, without underlying ischemia injury pattern  Telemetry: (personally reviewed by me) Sinus rhythm without ectopy   Impression:  Multivessel CAD with prior coronary interventions, presenting with cardiac chest pain, concerning for unstable angina Benign essential hypertension. Hyperlipidemia  Recommendations:  Multivessel CAD with prior coronary interventions, presenting with cardiac chest pain, concerning for unstable angina Anginal chest pain. Started last week Tuesday, intermittent, last episode earlier today High sensitive troponins negative x 2. EKG shows sinus rhythm without underlying ischemia injury pattern. No active chest pain Hemodynamically stable and laboratory values illustrated adequate organ perfusion -no laboratory values or physical examinations findings to  suggest shock physiology. We discussed the role of ischemic workup given his presentation.  We spoke about left heart catheterization with possible intervention as well as nuclear stress test to evaluate for reversible ischemia. However since his pretest probability for obstructive CAD or restent stenosis is high recommended left heart catheterization with possible intervention.  After discussing the risks, benefits, alternatives, and limitations both patient and wife are agreeable with proceeding forward with invasive angiography with possible intervention. N.p.o. after midnight. Will hold off on systemic heparinization at this time.  However if he has recurrence of chest pain patient is advised to inform nursing staff so that blood work, EKG, and heparin  can be ordered. Echo will be ordered to evaluate for structural heart disease and left ventricular systolic function. Continue dual antibiotic therapy. Continue statin therapy. Continue antianginal therapy Agree with checking A1c and fasting lipid profile.  Informed Consent   Shared Decision Making/Informed Consent The risks [stroke (1 in 1000), death (1 in 1000), kidney failure [usually temporary] (1 in 500), bleeding (1 in 200), allergic reaction [possibly serious] (1 in 200)], benefits (diagnostic support and management of coronary artery disease) and alternatives of a cardiac catheterization were discussed in detail with Michael [name] and he is willing to proceed.     Further recommendations to follow as the case evolves.   This note was created using a voice recognition software as a result there may be grammatical errors inadvertently enclosed that do not reflect the nature of this encounter. Every attempt is made to correct such errors.   Madonna Large, DO, [facility]  [address] [place], [place] [address] Pager: [phone] Office: [phone] 10/01/2023 4:35 PM

## 2023-10-01 NOTE — ED Triage Notes (Signed)
 Pt reports L sided chest tightness progressing over the last week, especially when he exerts himself. Also endorses nausea when pain comes. Pt has had MI in the past and multiple stents.

## 2023-10-02 ENCOUNTER — Observation Stay (HOSPITAL_COMMUNITY): Payer: Medicare Other

## 2023-10-02 ENCOUNTER — Encounter (HOSPITAL_COMMUNITY)
Admission: EM | Disposition: A | Discharge status: Home / Self Care | Payer: Self-pay | Source: Home / Self Care | Attending: Emergency Medicine

## 2023-10-02 DIAGNOSIS — I200 Unstable angina: Secondary | ICD-10-CM | POA: Diagnosis not present

## 2023-10-02 DIAGNOSIS — E782 Mixed hyperlipidemia: Secondary | ICD-10-CM | POA: Diagnosis not present

## 2023-10-02 DIAGNOSIS — I255 Ischemic cardiomyopathy: Secondary | ICD-10-CM | POA: Diagnosis not present

## 2023-10-02 DIAGNOSIS — I25110 Atherosclerotic heart disease of native coronary artery with unstable angina pectoris: Secondary | ICD-10-CM | POA: Diagnosis not present

## 2023-10-02 DIAGNOSIS — I10 Essential (primary) hypertension: Secondary | ICD-10-CM | POA: Diagnosis not present

## 2023-10-02 DIAGNOSIS — Z955 Presence of coronary angioplasty implant and graft: Secondary | ICD-10-CM | POA: Diagnosis not present

## 2023-10-02 HISTORY — PX: LEFT HEART CATH AND CORONARY ANGIOGRAPHY: CATH118249

## 2023-10-02 HISTORY — PX: CORONARY PRESSURE/FFR STUDY: CATH118243

## 2023-10-02 LAB — LIPID PANEL
Cholesterol: 122 mg/dL (ref 0–200)
HDL: 49 mg/dL (ref >40)
LDL Cholesterol: 55 mg/dL (ref 0–99)
Total CHOL/HDL Ratio: 2.5 {ratio}
Triglycerides: 90 mg/dL (ref <150)
VLDL: 18 mg/dL (ref 0–40)

## 2023-10-02 LAB — ECHOCARDIOGRAM COMPLETE
AR max vel: 2.67 cm2
AV Area VTI: 2.99 cm2
AV Area mean vel: 2.71 cm2
AV Mean grad: 3 mm[Hg]
AV Peak grad: 4.8 mm[Hg]
Ao pk vel: 1.09 m/s
Area-P 1/2: 3.31 cm2
Height: 66 in
S' Lateral: 4.5 cm
Weight: 3072 [oz_av]

## 2023-10-02 LAB — BASIC METABOLIC PANEL
Anion gap: 10 (ref 5–15)
BUN: 13 mg/dL (ref 8–23)
CO2: 27 mmol/L (ref 22–32)
Calcium: 9.4 mg/dL (ref 8.9–10.3)
Chloride: 105 mmol/L (ref 98–111)
Creatinine, Ser: 0.98 mg/dL (ref 0.61–1.24)
GFR, Estimated: >60 mL/min (ref >60)
Glucose, Bld: 100 mg/dL — ABNORMAL HIGH (ref 70–99)
Potassium: 3.8 mmol/L (ref 3.5–5.1)
Sodium: 142 mmol/L (ref 135–145)

## 2023-10-02 LAB — CBC
HCT: 41.8 % (ref 39.0–52.0)
Hemoglobin: 14.8 g/dL (ref 13.0–17.0)
MCH: 32.2 pg (ref 26.0–34.0)
MCHC: 35.4 g/dL (ref 30.0–36.0)
MCV: 90.9 fL (ref 80.0–100.0)
Platelets: 238 10*3/uL (ref 150–400)
RBC: 4.6 MIL/uL (ref 4.22–5.81)
RDW: 13.2 % (ref 11.5–15.5)
WBC: 6.8 10*3/uL (ref 4.0–10.5)
nRBC: 0 % (ref 0.0–0.2)

## 2023-10-02 LAB — POCT ACTIVATED CLOTTING TIME: Activated Clotting Time: 285 s

## 2023-10-02 LAB — HIV ANTIBODY (ROUTINE TESTING W REFLEX): HIV Screen 4th Generation wRfx: NONREACTIVE

## 2023-10-02 SURGERY — LEFT HEART CATH AND CORONARY ANGIOGRAPHY
Anesthesia: LOCAL

## 2023-10-02 MED ORDER — ADENOSINE (DIAGNOSTIC) 140MCG/KG/MIN
INTRAVENOUS | Status: DC | PRN
  Administered 2023-10-02: 140 ug/kg/min via INTRAVENOUS

## 2023-10-02 MED ORDER — FENTANYL CITRATE (PF) 100 MCG/2ML IJ SOLN
INTRAMUSCULAR | Status: DC | PRN
  Administered 2023-10-02: 25 ug via INTRAVENOUS

## 2023-10-02 MED ORDER — SODIUM CHLORIDE 0.9% FLUSH: 3.0000 mL | INTRAVENOUS | Status: DC | PRN

## 2023-10-02 MED ORDER — HEPARIN SODIUM (PORCINE) 1000 UNIT/ML IJ SOLN
INTRAMUSCULAR | Status: AC
  Filled 2023-10-02: qty 10

## 2023-10-02 MED ORDER — FENTANYL CITRATE (PF) 100 MCG/2ML IJ SOLN
INTRAMUSCULAR | Status: AC
  Filled 2023-10-02: qty 2

## 2023-10-02 MED ORDER — SODIUM CHLORIDE 0.9% FLUSH
3.0000 mL | Freq: Two times a day (BID) | INTRAVENOUS | Status: DC
  Administered 2023-10-02 – 2023-10-03 (×2): 3 mL via INTRAVENOUS

## 2023-10-02 MED ORDER — SODIUM CHLORIDE 0.9 % WEIGHT BASED INFUSION: 3.0000 mL/kg/h | INTRAVENOUS | Status: AC

## 2023-10-02 MED ORDER — SODIUM CHLORIDE 0.9 % IV SOLN: 250.0000 mL | INTRAVENOUS | Status: DC | PRN

## 2023-10-02 MED ORDER — LIDOCAINE HCL (PF) 1 % IJ SOLN
INTRAMUSCULAR | Status: DC | PRN
  Administered 2023-10-02: 5 mL

## 2023-10-02 MED ORDER — MIDAZOLAM HCL 2 MG/2ML IJ SOLN
INTRAMUSCULAR | Status: DC | PRN
  Administered 2023-10-02: 1 mg via INTRAVENOUS

## 2023-10-02 MED ORDER — SODIUM CHLORIDE 0.9 % WEIGHT BASED INFUSION: 1.0000 mL/kg/h | INTRAVENOUS | Status: DC

## 2023-10-02 MED ORDER — VERAPAMIL HCL 2.5 MG/ML IV SOLN
INTRAVENOUS | Status: DC | PRN
  Administered 2023-10-02: 10 mL via INTRA_ARTERIAL

## 2023-10-02 MED ORDER — SODIUM CHLORIDE 0.9 % WEIGHT BASED INFUSION: 3.0000 mL/kg/h | INTRAVENOUS | Status: DC

## 2023-10-02 MED ORDER — ASPIRIN 81 MG PO CHEW
81.0000 mg | CHEWABLE_TABLET | ORAL | Status: AC
  Administered 2023-10-02: 81 mg via ORAL
  Filled 2023-10-02: qty 1

## 2023-10-02 MED ORDER — HEPARIN (PORCINE) IN NACL 1000-0.9 UT/500ML-% IV SOLN
INTRAVENOUS | Status: DC | PRN
  Administered 2023-10-02 (×2): 500 mL

## 2023-10-02 MED ORDER — IOHEXOL 350 MG/ML SOLN
INTRAVENOUS | Status: DC | PRN
  Administered 2023-10-02: 60 mL via INTRA_ARTERIAL

## 2023-10-02 MED ORDER — SODIUM CHLORIDE 0.9 % WEIGHT BASED INFUSION
1.0000 mL/kg/h | INTRAVENOUS | Status: DC
  Administered 2023-10-02: 1 mL/kg/h via INTRAVENOUS

## 2023-10-02 MED ORDER — LIDOCAINE HCL (PF) 1 % IJ SOLN
INTRAMUSCULAR | Status: AC
  Filled 2023-10-02: qty 30

## 2023-10-02 MED ORDER — HEPARIN SODIUM (PORCINE) 1000 UNIT/ML IJ SOLN
INTRAMUSCULAR | Status: DC | PRN
  Administered 2023-10-02: 5000 [IU] via INTRAVENOUS
  Administered 2023-10-02: 4000 [IU] via INTRAVENOUS

## 2023-10-02 MED ORDER — HYDRALAZINE HCL 20 MG/ML IJ SOLN: 10.0000 mg | INTRAMUSCULAR | Status: AC | PRN

## 2023-10-02 MED ORDER — LABETALOL HCL 5 MG/ML IV SOLN: 10.0000 mg | INTRAVENOUS | Status: AC | PRN

## 2023-10-02 MED ORDER — MIDAZOLAM HCL 2 MG/2ML IJ SOLN
INTRAMUSCULAR | Status: AC
  Filled 2023-10-02: qty 2

## 2023-10-02 MED ORDER — ADENOSINE 12 MG/4ML IV SOLN
INTRAVENOUS | Status: AC
  Filled 2023-10-02: qty 20

## 2023-10-02 MED ORDER — VERAPAMIL HCL 2.5 MG/ML IV SOLN
INTRAVENOUS | Status: AC
  Filled 2023-10-02: qty 2

## 2023-10-02 SURGICAL SUPPLY — 12 items
CATH INFINITI 6F ANG MULTIPACK (CATHETERS) IMPLANT
CATH LAUNCHER 6FR EBU3.5 (CATHETERS) IMPLANT
DEVICE RAD COMP TR BAND LRG (VASCULAR PRODUCTS) IMPLANT
GLIDESHEATH SLEND SS 6F .021 (SHEATH) IMPLANT
GUIDEWIRE INQWIRE 1.5J.035X260 (WIRE) IMPLANT
GUIDEWIRE PRESSURE X 175 (WIRE) IMPLANT
INQWIRE 1.5J .035X260CM (WIRE) ×1
KIT ESSENTIALS PG (KITS) IMPLANT
KIT HEMO VALVE WATCHDOG (MISCELLANEOUS) IMPLANT
PACK CARDIAC CATHETERIZATION (CUSTOM PROCEDURE TRAY) ×1 IMPLANT
SET ATX-X65L (MISCELLANEOUS) IMPLANT
WIRE EMERALD 3MM-J .035X260CM (WIRE) IMPLANT

## 2023-10-02 NOTE — Interval H&P Note (Signed)
 History and Physical Interval Note:  10/02/2023 12:41 PM  Michael [name]  has presented today for surgery, with the diagnosis of chest pain.  The various methods of treatment have been discussed with the patient and family. After consideration of risks, benefits and other options for treatment, the patient has consented to  Procedure(s): LEFT HEART CATH AND CORONARY ANGIOGRAPHY (N/A) as a surgical intervention.  The patient's history has been reviewed, patient examined, no change in status, stable for surgery.  I have reviewed the patient's chart and labs.  Questions were answered to the patient's satisfaction.     Annelisa Ryback K Lesly Pontarelli

## 2023-10-02 NOTE — H&P (View-Only) (Signed)
   Patient Name: Michael [name] Date of Encounter: 10/02/2023 [facility] Cardiologist: Lynwood Schilling, MD   Interval Summary  .    No chest pain overnight. Resting in bed comfortably. Pending echo and heart catheterization later today  Vital Signs .    Vitals:   10/02/23 0700 10/02/23 0813 10/02/23 0900 10/02/23 1000  BP: 124/76  123/71 121/76  Pulse: 64  (!) 54 69  Resp: 15  13 14   Temp:  97.9 F (36.6 C)    TempSrc:  Oral    SpO2: 98%  100% 100%  Weight:      Height:       No intake or output data in the 24 hours ending 10/02/23 1019    10/01/2023    1:36 PM 08/25/2023    9:23 AM 07/13/2023    9:21 AM  Last 3 Weights  Weight (lbs) 192 lb 192 lb 192 lb  Weight (kg) 87.091 kg 87.091 kg 87.091 kg      Telemetry/ECG    Sinus rhythm without sustained arrhythmia- Personally Reviewed  Physical Exam .   Physical Exam  Constitutional: No distress.  hemodynamically stable  Neck: No JVD present.  Cardiovascular: Normal rate, regular rhythm, S1 normal and S2 normal. Exam reveals no gallop, no S3 and no S4.  No murmur heard. Pulses:      Radial pulses are 1+ on the right side and 2+ on the left side.       Dorsalis pedis pulses are 2+ on the right side and 2+ on the left side.       Posterior tibial pulses are 2+ on the right side and 2+ on the left side.  Pulmonary/Chest: Effort normal and breath sounds normal. No stridor. He has no wheezes. He has no rales.  Abdominal: Soft. Bowel sounds are normal. He exhibits no distension. There is no abdominal tenderness.  Musculoskeletal:        General: No edema.     Cervical back: Neck supple.  Neurological: He is alert and oriented to person, place, and time. He has intact cranial nerves (2-12).  Skin: Skin is warm.   Assessment & Plan .     Multivessel CAD with prior coronary interventions presents with cardiac chest pain concerning for unstable angina Experiencing classic anginal chest pain with exertional  activities. High sensitive troponins negative x 2. EKG shows sinus rhythm without underlying ischemia injury pattern. No active chest pain at rest or overnight. Hemodynamically has remained stable and lab values have illustrated adequate organ perfusion Shared decision was to hold off on IV heparin  per ACS protocol -to minimize risk of bleeding.  Echo pending  Scheduled for left heart catheterization with possible interventions later today.  No questions or concerns at this time.  Continue dual antiplatelet therapy. Continue statin therapy. Continue antianginal therapy. Hemoglobin A1c well-controlled at 6.2. Lipids are also well-controlled, LDL 55 mg/dL on current therapy  Benign essential hypertension: well controlled on current tx.   Hyperlipidemia: Continue Lipitor  80 mg p.o. daily.  Lipids are well-controlled, LDL of 55 mg/dL.  For questions or updates, please contact  [facility] Please consult [email or web address] for contact info under      Signed, Madonna Large, DO, [facility]  [facility]  [address] [place], [place] [address] Pager: [phone] Office: [phone] 10:26 AM 10/02/23

## 2023-10-02 NOTE — Care Management Obs Status (Signed)
 MEDICARE OBSERVATION STATUS NOTIFICATION   Patient Details  Name: Kiwan Gadsden MRN: [id number] Date of Birth: May 24, 1956   Medicare Observation Status Notification Given:  Yes    Oletta Cohn, RN 10/02/2023, 10:30 AM

## 2023-10-02 NOTE — Progress Notes (Signed)
   Patient Name: Michael [name] Date of Encounter: 10/02/2023 [facility] Cardiologist: Lynwood Schilling, MD   Interval Summary  .    No chest pain overnight. Resting in bed comfortably. Pending echo and heart catheterization later today  Vital Signs .    Vitals:   10/02/23 0700 10/02/23 0813 10/02/23 0900 10/02/23 1000  BP: 124/76  123/71 121/76  Pulse: 64  (!) 54 69  Resp: 15  13 14   Temp:  97.9 F (36.6 C)    TempSrc:  Oral    SpO2: 98%  100% 100%  Weight:      Height:       No intake or output data in the 24 hours ending 10/02/23 1019    10/01/2023    1:36 PM 08/25/2023    9:23 AM 07/13/2023    9:21 AM  Last 3 Weights  Weight (lbs) 192 lb 192 lb 192 lb  Weight (kg) 87.091 kg 87.091 kg 87.091 kg      Telemetry/ECG    Sinus rhythm without sustained arrhythmia- Personally Reviewed  Physical Exam .   Physical Exam  Constitutional: No distress.  hemodynamically stable  Neck: No JVD present.  Cardiovascular: Normal rate, regular rhythm, S1 normal and S2 normal. Exam reveals no gallop, no S3 and no S4.  No murmur heard. Pulses:      Radial pulses are 1+ on the right side and 2+ on the left side.       Dorsalis pedis pulses are 2+ on the right side and 2+ on the left side.       Posterior tibial pulses are 2+ on the right side and 2+ on the left side.  Pulmonary/Chest: Effort normal and breath sounds normal. No stridor. He has no wheezes. He has no rales.  Abdominal: Soft. Bowel sounds are normal. He exhibits no distension. There is no abdominal tenderness.  Musculoskeletal:        General: No edema.     Cervical back: Neck supple.  Neurological: He is alert and oriented to person, place, and time. He has intact cranial nerves (2-12).  Skin: Skin is warm.   Assessment & Plan .     Multivessel CAD with prior coronary interventions presents with cardiac chest pain concerning for unstable angina Experiencing classic anginal chest pain with exertional  activities. High sensitive troponins negative x 2. EKG shows sinus rhythm without underlying ischemia injury pattern. No active chest pain at rest or overnight. Hemodynamically has remained stable and lab values have illustrated adequate organ perfusion Shared decision was to hold off on IV heparin  per ACS protocol -to minimize risk of bleeding.  Echo pending  Scheduled for left heart catheterization with possible interventions later today.  No questions or concerns at this time.  Continue dual antiplatelet therapy. Continue statin therapy. Continue antianginal therapy. Hemoglobin A1c well-controlled at 6.2. Lipids are also well-controlled, LDL 55 mg/dL on current therapy  Benign essential hypertension: well controlled on current tx.   Hyperlipidemia: Continue Lipitor  80 mg p.o. daily.  Lipids are well-controlled, LDL of 55 mg/dL.  For questions or updates, please contact  Heart[facility] Please consult [email or web address] for contact info under      Signed, Madonna Large, DO, [facility]  [facility]  [address] [place], [place] [address] Pager: [phone] Office: [phone] 10:26 AM 10/02/23

## 2023-10-03 DIAGNOSIS — I25110 Atherosclerotic heart disease of native coronary artery with unstable angina pectoris: Secondary | ICD-10-CM | POA: Diagnosis not present

## 2023-10-03 DIAGNOSIS — I10 Essential (primary) hypertension: Secondary | ICD-10-CM | POA: Diagnosis not present

## 2023-10-03 DIAGNOSIS — E782 Mixed hyperlipidemia: Secondary | ICD-10-CM | POA: Diagnosis not present

## 2023-10-03 DIAGNOSIS — I200 Unstable angina: Secondary | ICD-10-CM | POA: Diagnosis not present

## 2023-10-03 DIAGNOSIS — I255 Ischemic cardiomyopathy: Secondary | ICD-10-CM | POA: Diagnosis not present

## 2023-10-03 MED ORDER — RANOLAZINE ER 500 MG PO TB12: 500.0000 mg | ORAL_TABLET | Freq: Two times a day (BID) | ORAL | 5 refills | Status: DC

## 2023-10-03 NOTE — Plan of Care (Signed)
  Problem: Education: Goal: Understanding of cardiac disease, CV risk reduction, and recovery process will improve Outcome: Adequate for Discharge Goal: Individualized Educational Video(s) Outcome: Adequate for Discharge   Problem: Activity: Goal: Ability to tolerate increased activity will improve Outcome: Adequate for Discharge   Problem: Cardiac: Goal: Ability to achieve and maintain adequate cardiovascular perfusion will improve Outcome: Adequate for Discharge   Problem: Health Behavior/Discharge Planning: Goal: Ability to safely manage health-related needs after discharge will improve Outcome: Adequate for Discharge   Problem: Education: Goal: Knowledge of General Education information will improve Description: Including pain rating scale, medication(s)/side effects and non-pharmacologic comfort measures Outcome: Adequate for Discharge   Problem: Health Behavior/Discharge Planning: Goal: Ability to manage health-related needs will improve Outcome: Adequate for Discharge   Problem: Clinical Measurements: Goal: Ability to maintain clinical measurements within normal limits will improve Outcome: Adequate for Discharge Goal: Will remain free from infection Outcome: Adequate for Discharge Goal: Diagnostic test results will improve Outcome: Adequate for Discharge Goal: Respiratory complications will improve Outcome: Adequate for Discharge Goal: Cardiovascular complication will be avoided Outcome: Adequate for Discharge   Problem: Activity: Goal: Risk for activity intolerance will decrease Outcome: Adequate for Discharge   Problem: Nutrition: Goal: Adequate nutrition will be maintained Outcome: Adequate for Discharge   Problem: Coping: Goal: Level of anxiety will decrease Outcome: Adequate for Discharge   Problem: Pain Management: Goal: General experience of comfort will improve Outcome: Adequate for Discharge   Problem: Safety: Goal: Ability to remain free from  injury will improve Outcome: Adequate for Discharge   Problem: Skin Integrity: Goal: Risk for impaired skin integrity will decrease Outcome: Adequate for Discharge   Problem: Education: Goal: Understanding of CV disease, CV risk reduction, and recovery process will improve Outcome: Adequate for Discharge Goal: Individualized Educational Video(s) Outcome: Adequate for Discharge   Problem: Activity: Goal: Ability to return to baseline activity level will improve Outcome: Adequate for Discharge   Problem: Cardiovascular: Goal: Ability to achieve and maintain adequate cardiovascular perfusion will improve Outcome: Adequate for Discharge Goal: Vascular access site(s) Level 0-1 will be maintained Outcome: Adequate for Discharge   Problem: Health Behavior/Discharge Planning: Goal: Ability to safely manage health-related needs after discharge will improve Outcome: Adequate for Discharge

## 2023-10-03 NOTE — Discharge Summary (Addendum)
 Discharge Summary    Patient ID: Michael [name] MRN: [id number]; DOB: [birth year]  Admit date: 10/01/2023 Discharge date: 10/03/2023  PCP:  [name], MD   [facility] Providers Cardiologist:  Lynwood Schilling, MD   {  Discharge Diagnoses    Principal Problem:   Unstable angina [facility]) Active Problems:   CAD S/P multiple PCIs   Ischemic cardiomyopathy   Mixed hyperlipidemia   Benign hypertension    Diagnostic Studies/Procedures    Left heart catheterization 10/02/2023  Dist Cx lesion is 20% stenosed.   Previously placed Dist RCA stent of unknown type is  widely patent.   Previously placed Prox LAD to Mid LAD stent of unknown type is  widely patent.   Previously placed Mid Cx to Dist Cx stent of unknown type is  widely patent.   1.  Widely patent stents in proximal to mid LAD, mid left circumflex, and distal right coronary artery with mild nonobstructive disease elsewhere. 2.  LVEDP of 15 mmHg. 3.  Evidence for coronary microvascular dysfunction with a CFR of 1.5 with normal being greater than 2-2.5. 4.  Occluded right radial artery necessitating a left radial approach.   Recommendation: Given evidence of coronary microvascular dysfunction suggest withdrawing nitrate therapy and up titrating doses of ARB's, calcium  channel blockers, and beta-blockers.  Echocardiogram 10/02/2023 1. Left ventricular ejection fraction, by estimation, is 45 to 50%. The  left ventricle has mildly decreased function. The left ventricle  demonstrates regional wall motion abnormalities with basal to mid inferior  and inferolateral severe hypokinesis.  Endocardium was poorly visualized, Definity  may have helped but was not  used. Left ventricular diastolic parameters are consistent with Grade I  diastolic dysfunction (impaired relaxation).   2. Right ventricular systolic function is normal. The right ventricular  size is mildly enlarged. Tricuspid regurgitation signal is  inadequate for  assessing PA pressure.   3. The mitral valve is normal in structure. Trivial mitral valve  regurgitation. No evidence of mitral stenosis.   4. The aortic valve is tricuspid. There is mild calcification of the  aortic valve. Aortic valve regurgitation is not visualized. No aortic  stenosis is present.   5. The IVC was not visualized.   6. Technically difficult study with poor acoustic windows.     _____________   History of Present Illness     Michael [name] is a 68 y.o. male with CAD status post multiple stents, ischemic cardiomyopathy, hypertension, hyperlipidemia  He follows Dr Schilling for CAD. He has received multiple cardiac stents in the past to RCA, Lcx, LAD. Most recent heart cath was done 05/13/16 for chest pain following 2 overlapping stents palcement to mid LAD on 05/06/16. This revealed all patent stents in mid Lcx, ostial RCA, mid/distal RCA, and 2 overlapping DES in mid LAD. He did have 50% ost RPDA, 40% mid Cx, and 70% Diag 2 that were managed medically. He was concerned for coronary spasms and imdur  was started. He was readmitted 12/16/2018 with chest pain. CTA of the chest was negative for PE. Myoview  12/17/2018 was intermediate risk study, finding showed large defect of severe severity present in the inferolateral wall consistent with scar, EF 38% with inferior anterolateral akinesis. Echo from 12/17/18 showed EF 40 to 45%, severe akinesis of the entire inferior wall. Repeat Echo on 12/18/21 showed LVEF 53%, global hypokinesis, mild asymmetric LVH, grade I DD, normal RV, mild MR. He was last seen by Dr Schilling 12/23/22, doing well, tolerating swimming. He was told to  continue ASA 81mg , Brilinta  60mg  BID, lipitor  80mg , and Imdur  60mg  for medical therapy.    He states he has been doing fairly well over the years. He noted new onset of chest tightness last Tuesday. Pain is located at the left side of the chest. Pain occurs after he exercise/exertion. It's associated  with mild lightheadedness, nausea, and shortness of breath. Symptoms typically resolves when he stops exercise. This felt unusual for him and is different than his symptoms before his previous MI. He has no pain today. This has occurred twice last week.  And last episode earlier on day of admission.  He denied any resting SOB, rapid weight gain, leg edema, orthopnea. He is concerned about his heart and therefore came to ER. He has been complaint with his medication. He does not check BP at home.    Labs showed unremarkable BMP and CBC. Hs trop 6>5. CXR showed no acute findings. EKG showed sinus rhythm 81 bpm, non-specific T flattening of inferolateral leads. Cardiology is consulted today for further evaluation of chest pain. He was given full dose ASA at ED.   Hospital Course     Consultants:    Chest pain Multivessel CAD with multiple stents His last cath in 2017 showed significant mid to distal LAD stenosis was successfully stented x 2.  He presented with exertional angina.  EKG showing no acute ST-T wave changes.  Troponins negative x 2.  He was taken to the Cath Lab that showed widely patent stents in the proximal to mid LAD, mid left circumflex and distal RCA with mild nonobstructive disease elsewhere.  He had some evidence of coronary microvascular dysfunction with a CFR of 1.5 (normal being 2-2.5).  Given his microvascular dysfunction it was recommended that nitrates were withdrawn and up titration of his ARB, CCB, BB. Continue aspirin , Imdur  60 mg, Brilinta  60 mg twice daily, atorvastatin  80 mg.  BP is soft, will so will start Ranexa  500 mg twice daily.  Consider further titration outpatient as BP allows. Will ensure that he has nitroglycerin  at home. LDL well controlled, 55 this admission.  Ischemic cardiomyopathy EF is been stable currently 45 to 50% with regional motion abnormalities consistent with previous.  Normal RV function.  Based off Dr. Denver previous notes he has not been  aggressively managing this as it has been stable, should it reduce in the future he would titrate GDMT more.  Patient seen and examined by Dr. Krystal and deemed stable for discharge.  He has been given Instructions, no acute complications.  Follow-up has been arranged.  Medication sent to his home pharmacy.   Did the patient have an acute coronary syndrome (MI, NSTEMI, STEMI, etc) this admission?:  No                               Did the patient have a percutaneous coronary intervention (stent / angioplasty)?:  No.          _____________  Discharge Vitals Blood pressure 111/71, pulse 76, temperature 97.8 F (36.6 C), temperature source Oral, resp. rate 17, height 5' 6 (1.676 m), weight 83.9 kg, SpO2 97%.  Filed Weights   10/01/23 1336 10/02/23 1824 10/03/23 0444  Weight: 87.1 kg 83.9 kg 83.9 kg    Labs & Radiologic Studies    CBC Recent Labs    10/01/23 1848 10/02/23 0442  WBC 7.6 6.8  HGB 14.6 14.8  HCT 41.6 41.8  MCV 90.8  90.9  PLT 216 238   Basic Metabolic Panel Recent Labs    98/90/74 1231 10/01/23 1848 10/02/23 0442  NA 139  --  142  K 3.9  --  3.8  CL 103  --  105  CO2 24  --  27  GLUCOSE 115*  --  100*  BUN 19  --  13  CREATININE 0.89 0.92 0.98  CALCIUM  8.9  --  9.4  MG 1.8  --   --    Liver Function Tests No results for input(s): AST, ALT, ALKPHOS, BILITOT, PROT, ALBUMIN in the last 72 hours. No results for input(s): LIPASE, AMYLASE in the last 72 hours. High Sensitivity Troponin:   Recent Labs  Lab 10/01/23 1231 10/01/23 1413  TROPONINIHS 6 5    BNP Invalid input(s): POCBNP D-Dimer No results for input(s): DDIMER in the last 72 hours. Hemoglobin A1C Recent Labs    10/01/23 1848  HGBA1C 6.2*   Fasting Lipid Panel Recent Labs    10/02/23 0442  CHOL 122  HDL 49  LDLCALC 55  TRIG 90  CHOLHDL 2.5   Thyroid  Function Tests No results for input(s): TSH, T4TOTAL, T3FREE, THYROIDAB in the last 72  hours.  Invalid input(s): FREET3 _____________  CARDIAC CATHETERIZATION Result Date: 10/02/2023   Dist Cx lesion is 20% stenosed.   Previously placed Dist RCA stent of unknown type is  widely patent.   Previously placed Prox LAD to Mid LAD stent of unknown type is  widely patent.   Previously placed Mid Cx to Dist Cx stent of unknown type is  widely patent. 1.  Widely patent stents in proximal to mid LAD, mid left circumflex, and distal right coronary artery with mild nonobstructive disease elsewhere. 2.  LVEDP of 15 mmHg. 3.  Evidence for coronary microvascular dysfunction with a CFR of 1.5 with normal being greater than 2-2.5. 4.  Occluded right radial artery necessitating a left radial approach. Recommendation: Given evidence of coronary microvascular dysfunction suggest withdrawing nitrate therapy and up titrating doses of ARB's, calcium  channel blockers, and beta-blockers.   ECHOCARDIOGRAM COMPLETE Result Date: 10/02/2023    ECHOCARDIOGRAM REPORT   Patient Name:   FORDYCE LEPAK Date of Exam: 10/02/2023 Medical Rec #:  [id number]         Height:       66.0 in Accession #:    [id number]        Weight:       192.0 lb Date of Birth:  1956/08/10         BSA:          1.965 m Patient Age:    67 years          BP:           124/76 mmHg Patient Gender: M                 HR:           70 bpm. Exam Location:  Inpatient Procedure: 2D Echo, Cardiac Doppler and Color Doppler Indications:    Chest Pain  History:        Patient has prior history of Echocardiogram examinations, most                 recent 12/18/2021. CAD and Previous Myocardial Infarction; Risk                 Factors:Hypertension.  Sonographer:    Jayson Gaskins Referring Phys: [id number] XIKA ZHAO IMPRESSIONS  1.  Left ventricular ejection fraction, by estimation, is 45 to 50%. The left ventricle has mildly decreased function. The left ventricle demonstrates regional wall motion abnormalities with basal to mid inferior and inferolateral severe  hypokinesis. Endocardium was poorly visualized, Definity  may have helped but was not used. Left ventricular diastolic parameters are consistent with Grade I diastolic dysfunction (impaired relaxation).  2. Right ventricular systolic function is normal. The right ventricular size is mildly enlarged. Tricuspid regurgitation signal is inadequate for assessing PA pressure.  3. The mitral valve is normal in structure. Trivial mitral valve regurgitation. No evidence of mitral stenosis.  4. The aortic valve is tricuspid. There is mild calcification of the aortic valve. Aortic valve regurgitation is not visualized. No aortic stenosis is present.  5. The IVC was not visualized.  6. Technically difficult study with poor acoustic windows. FINDINGS  Left Ventricle: Left ventricular ejection fraction, by estimation, is 45 to 50%. The left ventricle has mildly decreased function. The left ventricle demonstrates regional wall motion abnormalities. The left ventricular internal cavity size was normal in size. There is no left ventricular hypertrophy. Left ventricular diastolic parameters are consistent with Grade I diastolic dysfunction (impaired relaxation). Right Ventricle: The right ventricular size is mildly enlarged. No increase in right ventricular wall thickness. Right ventricular systolic function is normal. Tricuspid regurgitation signal is inadequate for assessing PA pressure. Left Atrium: Left atrial size was normal in size. Right Atrium: Right atrial size was normal in size. Pericardium: There is no evidence of pericardial effusion. Mitral Valve: The mitral valve is normal in structure. Trivial mitral valve regurgitation. No evidence of mitral valve stenosis. Tricuspid Valve: The tricuspid valve is normal in structure. Tricuspid valve regurgitation is not demonstrated. Aortic Valve: The aortic valve is tricuspid. There is mild calcification of the aortic valve. Aortic valve regurgitation is not visualized. No aortic  stenosis is present. Aortic valve mean gradient measures 3.0 mmHg. Aortic valve peak gradient measures 4.8 mmHg. Aortic valve area, by VTI measures 2.99 cm. Pulmonic Valve: The pulmonic valve was normal in structure. Pulmonic valve regurgitation is trivial. Aorta: The aortic root is normal in size and structure. Venous: The IVC was not visualized. The inferior vena cava was not well visualized. IAS/Shunts: No atrial level shunt detected by color flow Doppler.  LEFT VENTRICLE PLAX 2D LVIDd:         5.40 cm   Diastology LVIDs:         4.50 cm   LV e' medial:    6.53 cm/s LV PW:         0.80 cm   LV E/e' medial:  10.1 LV IVS:        0.70 cm   LV e' lateral:   9.03 cm/s LVOT diam:     2.00 cm   LV E/e' lateral: 7.3 LV SV:         72 LV SV Index:   37 LVOT Area:     3.14 cm  RIGHT VENTRICLE RV S prime:     9.36 cm/s LEFT ATRIUM           Index        RIGHT ATRIUM           Index LA Vol (A2C): 41.5 ml 21.12 ml/m  RA Area:     22.30 cm LA Vol (A4C): 40.6 ml 20.66 ml/m  RA Volume:   67.40 ml  34.29 ml/m  AORTIC VALVE AV Area (Vmax):    2.67 cm AV Area (Vmean):  2.71 cm AV Area (VTI):     2.99 cm AV Vmax:           109.00 cm/s AV Vmean:          78.100 cm/s AV VTI:            0.241 m AV Peak Grad:      4.8 mmHg AV Mean Grad:      3.0 mmHg LVOT Vmax:         92.60 cm/s LVOT Vmean:        67.400 cm/s LVOT VTI:          0.229 m LVOT/AV VTI ratio: 0.95  AORTA Ao Root diam: 2.60 cm Ao Asc diam:  3.20 cm MITRAL VALVE MV Area (PHT): 3.31 cm     SHUNTS MV Decel Time: 229 msec     Systemic VTI:  0.23 m MV E velocity: 66.00 cm/s   Systemic Diam: 2.00 cm MV A velocity: 101.00 cm/s MV E/A ratio:  0.65 Dalton McleanMD Electronically signed by Ezra Kanner Signature Date/Time: 10/02/2023/9:40:27 AM    Final    DG Chest 2 View Result Date: 10/01/2023 CLINICAL DATA:  Left-sided chest pain and nausea. Previous myocardial infarct. EXAM: CHEST - 2 VIEW COMPARISON:  01/15/2022 FINDINGS: The heart size and mediastinal contours are  within normal limits. Both lungs are clear. The visualized skeletal structures are unremarkable. IMPRESSION: No active cardiopulmonary disease. Electronically Signed   By: Norleen DELENA Kil M.D.   On: 10/01/2023 12:52   Disposition   Pt is being discharged home today in good condition.  Follow-up Plans & Appointments     Follow-up Information     Emelia Josefa HERO, NP Follow up.   Specialty: Cardiology Why: Tuesday Oct 13, 2023 Appt at 8:25 AM (25 min)  Please be sure to log your blood pressure bring to upcoming appointment. Contact information: [address] [place] [address] [phone]                Discharge Instructions     Diet - low sodium heart healthy   Complete by: As directed    Discharge instructions   Complete by: As directed    Radial Site Care Refer to this sheet in the next few weeks. These instructions provide you with information on caring for yourself after your procedure. Your caregiver may also give you more specific instructions. Your treatment has been planned according to current medical practices, but problems sometimes occur. Call your caregiver if you have any problems or questions after your procedure. HOME CARE INSTRUCTIONS You may shower the day after the procedure. Remove the bandage (dressing) and gently wash the site with plain soap and water. Gently pat the site dry.  Do not apply powder or lotion to the site.  Do not submerge the affected site in water for 3 to 5 days.  Inspect the site at least twice daily.  Do not flex or bend the affected arm for 24 hours.  No lifting over 5 pounds (2.3 kg) for 5 days after your procedure.  Do not drive home if you are discharged the same day of the procedure. Have someone else drive you.  You may drive 24 hours after the procedure unless otherwise instructed by your caregiver.  What to expect: Any bruising will usually fade within 1 to 2 weeks.  Blood that collects in the tissue  (hematoma) may be painful to the touch. It should usually decrease in size and tenderness  within 1 to 2 weeks.  SEEK IMMEDIATE MEDICAL CARE IF: You have unusual pain at the radial site.  You have redness, warmth, swelling, or pain at the radial site.  You have drainage (other than a small amount of blood on the dressing).  You have chills.  You have a fever or persistent symptoms for more than 72 hours.  You have a fever and your symptoms suddenly get worse.  Your arm becomes pale, cool, tingly, or numb.  You have heavy bleeding from the site. Hold pressure on the site.   Increase activity slowly   Complete by: As directed         Discharge Medications   Allergies as of 10/03/2023   No Known Allergies      Medication List     TAKE these medications    aspirin  81 MG tablet Take 81 mg by mouth daily.   atorvastatin  80 MG tablet Commonly known as: LIPITOR  TAKE 1 TABLET DAILY   CHERRY PO Take 50 mg by mouth daily. Cherry extract   Cinnamon 500 MG capsule Take 500 mg by mouth daily.   famotidine  40 MG tablet Commonly known as: PEPCID  Take 1 tablet (40 mg total) by mouth daily.   isosorbide  mononitrate 60 MG 24 hr tablet Commonly known as: IMDUR  TAKE 1 TABLET DAILY   nitroGLYCERIN  0.4 MG SL tablet Commonly known as: NITROSTAT  DISSOLVE 1 TABLET UNDER THE TONGUE EVERY 5 MINUTES AS NEEDED FOR CHEST PAIN UP TO 3 DOSES What changed: See the new instructions.   Q-Sorb Co Q-10 100 MG capsule Generic drug: Coenzyme Q10 Take 1 capsule by mouth daily.   ranolazine  500 MG 12 hr tablet Commonly known as: Ranexa  Take 1 tablet (500 mg total) by mouth 2 (two) times daily.   ticagrelor  60 MG Tabs tablet Commonly known as: Brilinta  Take 1 tablet (60 mg total) by mouth 2 (two) times daily.   Turmeric 500 MG Caps Take 500 mg by mouth daily.           Outstanding Labs/Studies    Duration of Discharge Encounter: APP Time: 45 minutes   Signed, Thom LITTIE Sluder,  PA-C 10/03/2023, 10:46 AM

## 2023-10-03 NOTE — Plan of Care (Signed)
  Problem: Education: Goal: Understanding of cardiac disease, CV risk reduction, and recovery process will improve Outcome: Progressing Goal: Individualized Educational Video(s) Outcome: Progressing   Problem: Activity: Goal: Ability to tolerate increased activity will improve Outcome: Progressing   Problem: Cardiac: Goal: Ability to achieve and maintain adequate cardiovascular perfusion will improve Outcome: Progressing   Problem: Health Behavior/Discharge Planning: Goal: Ability to safely manage health-related needs after discharge will improve Outcome: Progressing   Problem: Education: Goal: Knowledge of General Education information will improve Description: Including pain rating scale, medication(s)/side effects and non-pharmacologic comfort measures Outcome: Progressing   Problem: Health Behavior/Discharge Planning: Goal: Ability to manage health-related needs will improve Outcome: Progressing   Problem: Clinical Measurements: Goal: Ability to maintain clinical measurements within normal limits will improve Outcome: Progressing Goal: Will remain free from infection Outcome: Progressing Goal: Diagnostic test results will improve Outcome: Progressing Goal: Respiratory complications will improve Outcome: Progressing Goal: Cardiovascular complication will be avoided Outcome: Progressing   Problem: Activity: Goal: Risk for activity intolerance will decrease Outcome: Progressing   Problem: Nutrition: Goal: Adequate nutrition will be maintained Outcome: Progressing   Problem: Coping: Goal: Level of anxiety will decrease Outcome: Progressing   Problem: Elimination: Goal: Will not experience complications related to bowel motility Outcome: Progressing Goal: Will not experience complications related to urinary retention Outcome: Progressing   Problem: Pain Management: Goal: General experience of comfort will improve Outcome: Progressing   Problem:  Safety: Goal: Ability to remain free from injury will improve Outcome: Progressing   Problem: Skin Integrity: Goal: Risk for impaired skin integrity will decrease Outcome: Progressing   Problem: Education: Goal: Understanding of CV disease, CV risk reduction, and recovery process will improve Outcome: Progressing Goal: Individualized Educational Video(s) Outcome: Progressing   Problem: Activity: Goal: Ability to return to baseline activity level will improve Outcome: Progressing   Problem: Cardiovascular: Goal: Ability to achieve and maintain adequate cardiovascular perfusion will improve Outcome: Progressing Goal: Vascular access site(s) Level 0-1 will be maintained Outcome: Progressing   Problem: Health Behavior/Discharge Planning: Goal: Ability to safely manage health-related needs after discharge will improve Outcome: Progressing

## 2023-10-05 ENCOUNTER — Encounter (HOSPITAL_COMMUNITY): Payer: Self-pay | Admitting: Internal Medicine

## 2023-10-06 LAB — LIPOPROTEIN A (LPA): Lipoprotein (a): 19.4 nmol/L (ref <75.0)

## 2023-10-09 NOTE — Progress Notes (Unsigned)
Cardiology Clinic Note   Patient Name: Michael [name] Date of Encounter: 10/13/2023  Primary Care Provider:  Shelva Majestic, MD Primary Cardiologist:  Rollene Rotunda, MD  Patient Profile     Michael [name] 68 year old male presents to the clinic today for follow-up evaluation of his coronary artery disease and hyperlipidemia.  Past Medical History    Past Medical History:  Diagnosis Date   Acute lateral meniscal tear, right, initial encounter 09/26/2021   Injection given September 26, 2018.   CAD S/P PCI to Cx & RCA    a. s/p PCI of LCx (2006) and ostium of RCA (2007) as well as DES to RCA in [place] (05/2014)   HTN (hypertension)    Hyperlipidemia    Plantar fasciitis 10/12/2017   Past Surgical History:  Procedure Laterality Date   CARDIAC CATHETERIZATION N/A 05/06/2016   Procedure: Left Heart Cath and Coronary Angiography;  Surgeon: Yvonne Kendall, MD;  Location: [facility] INVASIVE CV LAB;  Service: Cardiovascular;  Laterality: N/A;   CARDIAC CATHETERIZATION N/A 05/06/2016   Procedure: Coronary Stent Intervention;  Surgeon: Yvonne Kendall, MD;  Location: [facility] INVASIVE CV LAB;  Service: Cardiovascular;  Laterality: N/A;   CARDIAC CATHETERIZATION N/A 05/13/2016   Procedure: Coronary/Graft Angiography;  Surgeon: Peter M Swaziland, MD;  Location: [facility] INVASIVE CV LAB;  Service: Cardiovascular;  Laterality: N/A;   CORONARY ANGIOPLASTY WITH STENT PLACEMENT  2006   in [place], occluded CFX, s/p Taxus stent   CORONARY ANGIOPLASTY WITH STENT PLACEMENT  2007   in [place], ostial RCA stent   CORONARY ANGIOPLASTY WITH STENT PLACEMENT  2015   in [place], RCA stent   CORONARY PRESSURE/FFR STUDY N/A 10/02/2023   Procedure: CORONARY PRESSURE/FFR STUDY;  Surgeon: Orbie Pyo, MD;  Location: [facility] INVASIVE CV LAB;  Service: Cardiovascular;  Laterality: N/A;   LEFT HEART CATH AND CORONARY ANGIOGRAPHY N/A 10/02/2023   Procedure: LEFT HEART CATH AND CORONARY  ANGIOGRAPHY;  Surgeon: Orbie Pyo, MD;  Location: [facility] INVASIVE CV LAB;  Service: Cardiovascular;  Laterality: N/A;   TONSILLECTOMY AND ADENOIDECTOMY       Allergies  No Known Allergies  History of Present Illness     Michael [name] has a PMH of coronary artery disease status post multiple PCI, ischemic cardiomyopathy, HLD, and HTN.  His had multiple stents to RCA, circumflex, and LAD.  His most recent cardiac catheterization was 05/13/2016.  He presented with chest pain.  He underwent cardiac catheterization and received overlapping stents to his mid LAD 05/06/2016.  Catheterization showed patent stents in his mid circumflex, ostial RCA, mid/distal RCA, and overlapping stents in his mid LAD.  He was noted to have 50% ostial RPDA, 40% mid circumflex, and 70% diagonal 2.  Medical management was recommended.  He was also noted to have coronary spasms and Imdur was started.  He had previous catheterization in .  He was readmitted on 3/20 with chest discomfort.  CTA of his chest was negative for PE.  Nuclear stress testing 3/20 showed intermediate risk and showed a large defect of severe severity in the inferior lateral wall consistent with scar.  His EF was noted to be 38%.  Echocardiogram 12/17/2018 showed an EF of 40-45%, severe akinesis of the entire inferior wall.  Echocardiogram 3/23 showed an EF of 53%, mild asymmetric LVH, G1 DD, normal RV, mild MR.  He was seen in follow-up by Dr. Antoine Poche on 12/23/2022.  He was doing well at that time.  He was tolerating  zooming.  He was continued on his Brilinta, Imdur, atorvastatin.  He presented to the emergency department on 10/02/2023 and was discharged on 10/03/2023.  He reported that he has started noticing new onset of chest tightness around 09/29/2023.  He noted that the pain was on the left side of his chest.  His pain occurred after exercise and exertion.  He noted mild lightheadedness, nausea, and shortness of breath.  His symptoms  would resolve when he would stop exercising.  He reported this was different from his previous MIs.  He denied chest pain at the time of evaluation.  He noted that his symptoms occurred twice the previous week.  He denied shortness of breath at rest, lower extremity swelling, orthopnea, PND.  He was concerned about his heart and presents to the ED.  He reported compliance with his medications.  His lab work was unremarkable.  His EKG showed sinus rhythm 81 bpm with nonspecific T wave flattening on the inferior lateral leads.  Cardiology was consulted.  His cardiac troponins were negative x 2.  He was taken to the cardiac Cath Lab which showed widely patent stents in his proximal-mid LAD, mid left circumflex, distal RCA with mild nonobstructive disease in his other arteries.  It was felt that he had some coronary microvascular dysfunction.  He was started on Ranexa.  He was continued on aspirin, Imdur, Brilinta, and a atorvastatin.  His LDL was noted to be 55 at that time.  Echocardiogram showed EF of 45-50% with regional wall motion abnormalities consistent with previous MIs.  He presents to the clinic today for follow-up evaluation and states he continues to occasionally note light dizziness.  He also has had some episodes of soreness in his left armpit which she notes have been around times when he has chest tightness.  We reviewed his emergency department visit and cardiac catheterization as well as his echocardiogram.  We reviewed microvascular disease.  He reports that he has not returned to his normal physical activities.  He has not had any chest discomfort/nauseousness and tightness like before he went to the emergency department.  I have asked him to contact the office if his symptoms return or progress so that we may trial amlodipine.  Today I will continue his current medication regimen, have him increase his physical activity as tolerated, and plan follow-up in April.  Today he denies chest pain,  shortness of breath, lower extremity edema, fatigue, palpitations, melena, hematuria, hemoptysis, diaphoresis, weakness, presyncope, syncope, orthopnea, and PND.   Home Medications    Prior to Admission medications   Medication Sig Start Date End Date Taking? Authorizing Provider  aspirin 81 MG tablet Take 81 mg by mouth daily.    [provider]  atorvastatin (LIPITOR) 80 MG tablet TAKE 1 TABLET DAILY 06/16/22   Rollene Rotunda, MD  CHERRY PO Take 50 mg by mouth daily. Cherry extract    [provider]  Cinnamon 500 MG capsule Take 500 mg by mouth daily.    [provider]  Coenzyme Q10 (Q-SORB CO Q-10) 100 MG capsule Take 1 capsule by mouth daily.    [provider]  famotidine (PEPCID) 40 MG tablet Take 1 tablet (40 mg total) by mouth daily. Patient not taking: Reported on 10/01/2023 07/15/22   Meryl Dare, MD  isosorbide mononitrate (IMDUR) 60 MG 24 hr tablet TAKE 1 TABLET DAILY 08/27/22   Rollene Rotunda, MD  nitroGLYCERIN (NITROSTAT) 0.4 MG SL tablet DISSOLVE 1 TABLET UNDER THE TONGUE EVERY  5 MINUTES AS NEEDED FOR CHEST PAIN UP TO 3 DOSES Patient taking differently: Place 0.4 mg under the tongue every 5 (five) minutes as needed for chest pain. /Patient took 1 tablet yesterday,  10/01/23. 11/28/19   Rollene Rotunda, MD  ranolazine (RANEXA) 500 MG 12 hr tablet Take 1 tablet (500 mg total) by mouth 2 (two) times daily. 10/03/23   Abagail Kitchens, PA-C  ticagrelor (BRILINTA) 60 MG TABS tablet Take 1 tablet (60 mg total) by mouth 2 (two) times daily. 06/22/23   Rollene Rotunda, MD  Turmeric 500 MG CAPS Take 500 mg by mouth daily.    [provider]    Family History    Family History  Problem Relation Age of Onset   Hypertension Father    Dementia Father        Vascular. patient states dementia/possible alzheimers as well   Hyperlipidemia Sister    Cancer Brother        type unknown   Cancer Brother        type unknown   Heart disease Maternal  Grandfather 61       Died probably of heart diseasse   Heart disease Maternal Uncle 88       Died of "heart exploding"   Cerebral palsy Daughter    Stroke Son        severe problems with blood clotting   Colon cancer Neg Hx    Esophageal cancer Neg Hx    Pancreatic cancer Neg Hx    Stomach cancer Neg Hx    Liver disease Neg Hx    He indicated that his mother is deceased. He indicated that his father is deceased. He indicated that his sister is alive. He indicated that only one of his two brothers is alive. He indicated that his maternal grandmother is deceased. He indicated that his maternal grandfather is deceased. He indicated that his paternal grandmother is deceased. He indicated that his paternal grandfather is deceased. He indicated that both of his daughters are alive. He indicated that both of his sons are alive. He indicated that his maternal uncle is deceased. He indicated that the status of his neg hx is unknown.  Social History    Social History   Socioeconomic History   Marital status: Married    Spouse name: Not on file   Number of children: 4   Years of education: Not on file   Highest education level: Not on file  Occupational History   Occupation: retired  Tobacco Use   Smoking status: Never   Smokeless tobacco: Never  Vaping Use   Vaping status: Never Used  Substance and Sexual Activity   Alcohol use: No    Alcohol/week: 0.0 standard drinks of alcohol   Drug use: No   Sexual activity: Yes  Other Topics Concern   Not on file  Social History Narrative   Lives with wife.  4 kids total (2 step, 2 biological). 1 that has CP. Daughter [name]- comes to [facility]        Retired Water quality scientist.  Oldest daughter has CP      Hobbies: gardening, lawn care, care for grandkids   Social Drivers of Health   Financial Resource Strain: Low Risk  (04/02/2023)   Overall Financial Resource Strain (CARDIA)    Difficulty of Paying Living Expenses: Not hard at  all  Food Insecurity: No Food Insecurity (10/02/2023)   Hunger Vital Sign    Worried About Running Out  of Food in the Last Year: Never true    Ran Out of Food in the Last Year: Never true  Transportation Needs: No Transportation Needs (10/02/2023)   PRAPARE - Administrator, Civil Service (Medical): No    Lack of Transportation (Non-Medical): No  Physical Activity: Sufficiently Active (04/02/2023)   Exercise Vital Sign    Days of Exercise per Week: 2 days    Minutes of Exercise per Session: 90 min  Stress: No Stress Concern Present (04/02/2023)   [employer] of Occupational Health - Occupational Stress Questionnaire    Feeling of Stress : Not at all  Social Connections: Moderately Integrated (10/02/2023)   Social Connection and Isolation Panel [NHANES]    Frequency of Communication with Friends and Family: More than three times a week    Frequency of Social Gatherings with Friends and Family: Three times a week    Attends Religious Services: More than 4 times per year    Active Member of Clubs or Organizations: No    Attends Banker Meetings: Never    Marital Status: Married  Catering manager Violence: Not At Risk (10/02/2023)   Humiliation, Afraid, Rape, and Kick questionnaire    Fear of Current or Ex-Partner: No    Emotionally Abused: No    Physically Abused: No    Sexually Abused: No     Review of Systems    General:  No chills, fever, night sweats or weight changes.  Cardiovascular:  No chest pain, dyspnea on exertion, edema, orthopnea, palpitations, paroxysmal nocturnal dyspnea. Dermatological: No rash, lesions/masses Respiratory: No cough, dyspnea Urologic: No hematuria, dysuria Abdominal:   No nausea, vomiting, diarrhea, bright red blood per rectum, melena, or hematemesis Neurologic:  No visual changes, wkns, changes in mental status. All other systems reviewed and are otherwise negative except as noted above.  Physical Exam    VS:  BP  130/76 (BP Location: Left Arm, Patient Position: Sitting, Cuff Size: Normal)   Pulse 71   SpO2 99%  , BMI There is no height or weight on file to calculate BMI. GEN: Well nourished, well developed, in no acute distress. HEENT: normal. Neck: Supple, no JVD, carotid bruits, or masses. Cardiac: RRR, no murmurs, rubs, or gallops. No clubbing, cyanosis, edema.  Radials/DP/PT 2+ and equal bilaterally.  Respiratory:  Respirations regular and unlabored, clear to auscultation bilaterally. GI: Soft, nontender, nondistended, BS + x 4. MS: no deformity or atrophy. Skin: warm and dry, no rash. Neuro:  Strength and sensation are intact. Psych: Normal affect.  Accessory Clinical Findings    Recent Labs: 10/01/2023: Magnesium 1.8 10/02/2023: BUN 13; Creatinine, Ser 0.98; Hemoglobin 14.8; Platelets 238; Potassium 3.8; Sodium 142   Recent Lipid Panel    Component Value Date/Time   CHOL 122 10/02/2023 0442   TRIG 90 10/02/2023 0442   HDL 49 10/02/2023 0442   CHOLHDL 2.5 10/02/2023 0442   VLDL 18 10/02/2023 0442   LDLCALC 55 10/02/2023 0442   LDLCALC 54 08/03/2020 1448   LDLDIRECT 62.0 09/14/2019 0851         ECG personally reviewed by me today-none today.       Echocardiogram 10/02/2023 IMPRESSIONS     1. Left ventricular ejection fraction, by estimation, is 45 to 50%. The  left ventricle has mildly decreased function. The left ventricle  demonstrates regional wall motion abnormalities with basal to mid inferior  and inferolateral severe hypokinesis.  Endocardium was poorly visualized, Definity may have helped but was not  used. Left ventricular diastolic parameters are consistent with Grade I  diastolic dysfunction (impaired relaxation).   2. Right ventricular systolic function is normal. The right ventricular  size is mildly enlarged. Tricuspid regurgitation signal is inadequate for  assessing PA pressure.   3. The mitral valve is normal in structure. Trivial mitral valve   regurgitation. No evidence of mitral stenosis.   4. The aortic valve is tricuspid. There is mild calcification of the  aortic valve. Aortic valve regurgitation is not visualized. No aortic  stenosis is present.   5. The IVC was not visualized.   6. Technically difficult study with poor acoustic windows.   FINDINGS   Left Ventricle: Left ventricular ejection fraction, by estimation, is 45  to 50%. The left ventricle has mildly decreased function. The left  ventricle demonstrates regional wall motion abnormalities. The left  ventricular internal cavity size was normal  in size. There is no left ventricular hypertrophy. Left ventricular  diastolic parameters are consistent with Grade I diastolic dysfunction  (impaired relaxation).   Right Ventricle: The right ventricular size is mildly enlarged. No  increase in right ventricular wall thickness. Right ventricular systolic  function is normal. Tricuspid regurgitation signal is inadequate for  assessing PA pressure.   Left Atrium: Left atrial size was normal in size.   Right Atrium: Right atrial size was normal in size.   Pericardium: There is no evidence of pericardial effusion.   Mitral Valve: The mitral valve is normal in structure. Trivial mitral  valve regurgitation. No evidence of mitral valve stenosis.   Tricuspid Valve: The tricuspid valve is normal in structure. Tricuspid  valve regurgitation is not demonstrated.   Aortic Valve: The aortic valve is tricuspid. There is mild calcification  of the aortic valve. Aortic valve regurgitation is not visualized. No  aortic stenosis is present. Aortic valve mean gradient measures 3.0 mmHg.  Aortic valve peak gradient measures  4.8 mmHg. Aortic valve area, by VTI measures 2.99 cm.   Pulmonic Valve: The pulmonic valve was normal in structure. Pulmonic valve  regurgitation is trivial.   Aorta: The aortic root is normal in size and structure.   Venous: The IVC was not visualized.  The inferior vena cava was not well  visualized.   IAS/Shunts: No atrial level shunt detected by color flow Doppler.     Cardiac catheterization 10/02/2023    Dist Cx lesion is 20% stenosed.   Previously placed Dist RCA stent of unknown type is  widely patent.   Previously placed Prox LAD to Mid LAD stent of unknown type is  widely patent.   Previously placed Mid Cx to Dist Cx stent of unknown type is  widely patent.   1.  Widely patent stents in proximal to mid LAD, mid left circumflex, and distal right coronary artery with mild nonobstructive disease elsewhere. 2.  LVEDP of 15 mmHg. 3.  Evidence for coronary microvascular dysfunction with a CFR of 1.5 with normal being greater than 2-2.5. 4.  Occluded right radial artery necessitating a left radial approach.   Recommendation: Given evidence of coronary microvascular dysfunction suggest withdrawing nitrate therapy and up titrating doses of ARB's, calcium channel blockers, and beta-blockers.  Diagnostic Dominance: Right  Intervention  Assessment & Plan   1.  Coronary artery disease, chest discomfort-no chest pain today.    Does occasionally notice left axillary discomfort/soreness.  Cardiac catheterization showed patent proximal-mid LAD, mid left circumflex, distal RCA with nonobstructive disease elsewhere.  Discussed possibility of adding amlodipine if  symptoms recur/persist. Continue Ranexa, Imdur, aspirin, Brilinta, atorvastatin Increase physical activity as tolerated Heart healthy low-sodium diet  Hyperlipidemia-LDL 55 on 10/02/2023. High-fiber diet Continue aspirin, atorvastatin   Ischemic cardiomyopathy-no increased DOE.  Increasing physical activity.  Echocardiogram showed EF of 45-50% with regional wall motion abnormalities consistent with previous MIs. Heart healthy low-sodium diet Continue Imdur, Brilinta, atorvastatin, aspirin  Essential hypertension-BP today 130/76. Maintain blood pressure log Continue current  medical therapy  GERD-reports he has been noticing more frequent episodes of reflux since diet change with the holidays.  Currently using famotidine as needed. GERD diet instructions given  Disposition: Follow-up with Dr. Antoine Poche or me in 3-4 months.   Thomasene Ripple. Nakesha Ebrahim NP-C     10/13/2023, 9:36 AM [facility] [address] Office [phone] Fax [phone]    I spent 14 minutes examining this patient, reviewing medications, and using patient centered shared decision making involving their cardiac care.   I spent greater than 20 minutes reviewing their past medical history,  medications, and prior cardiac tests.

## 2023-10-13 ENCOUNTER — Ambulatory Visit: Payer: Medicare Other | Attending: General Practice | Admitting: General Practice

## 2023-10-13 ENCOUNTER — Encounter: Payer: Self-pay | Admitting: General Practice

## 2023-10-13 VITALS — BP 130/76 | HR 71

## 2023-10-13 DIAGNOSIS — E785 Hyperlipidemia, unspecified: Secondary | ICD-10-CM | POA: Diagnosis present

## 2023-10-13 DIAGNOSIS — I10 Essential (primary) hypertension: Secondary | ICD-10-CM | POA: Insufficient documentation

## 2023-10-13 DIAGNOSIS — I2510 Atherosclerotic heart disease of native coronary artery without angina pectoris: Secondary | ICD-10-CM | POA: Diagnosis present

## 2023-10-13 DIAGNOSIS — I255 Ischemic cardiomyopathy: Secondary | ICD-10-CM | POA: Insufficient documentation

## 2023-10-13 NOTE — Patient Instructions (Signed)
Medication Instructions:  The current medical regimen is effective;  continue present plan and medications as directed. Please refer to the Current Medication list given to you today.  *If you need a refill on your cardiac medications before your next appointment, please call your pharmacy*  Lab Work: NONE  Testing/Procedures: NONE  Other Instructions INCREASE PHYSICAL ACTIVITY-AS TOLERATED,IF YOU ARE HAVING MORE SYMPTOMS WITH THIS INCREASE, PLEASE LET us KNOW AND SEND MYCHART MESSAGE  PLEASE READ AND FOLLOW GERD DIET INSTRUCTIONS-ATTACHED  Follow-Up: At [facility], you and your health needs are our priority.  As part of our continuing mission to provide you with exceptional heart care, we have created designated Provider Care Teams.  These Care Teams include your primary Cardiologist (physician) and Advanced Practice Providers (APPs -  Physician Assistants and Nurse Practitioners) who all work together to provide you with the care you need, when you need it.  Your next appointment:   KEEP SCHEDULED APPOINTMENT   Provider:   Rollene Rotunda, MD      Parke Simmers Diet-GERD DIET A bland diet may consist of soft foods or foods that are not high in fat or are not greasy, acidic, or spicy. Avoiding certain foods may cause less irritation to your mouth, throat, stomach, or gastrointestinal tract. Avoiding certain foods may make you feel better. Everyone's tolerances are different. A bland diet should be based on what you can tolerate and what may cause discomfort. What is my plan? Your health care provider or dietitian may recommend specific changes to your diet to treat your symptoms. These changes may include: Eating small meals frequently. Cooking food until it is soft enough to chew easily. Taking the time to chew your food thoroughly, so it is easy to swallow and digest. Avoiding foods that cause you discomfort. These may include spicy food, fried food, greasy foods, hard-to-chew  foods, or citrus fruits and juices. Drinking slowly. What are tips for following this plan? Reading food labels To reduce fiber intake, look for food labels that say "whole," such as whole wheat or whole grain. Shopping Avoid food items that may have nuts or seeds. Avoid vegetables that may make you gassy or have a tough texture, such as broccoli, cauliflower, or corn. Cooking Cook foods thoroughly so they have a soft texture. Meal planning Make sure you include foods from all food groups to eat a balanced diet. Eat a variety of types of foods. Eat foods and drink beverages that do not cause you discomfort. These may include soups and broths with cooked meats, pasta, and vegetables. Lifestyle Sit up after meals, avoid tight clothing, and take time to eat and chew your food slowly. Ask your health care provider whether you should take dietary supplements. General information Mildly season your foods. Some seasonings, such as cayenne pepper, vinegar, or hot sauce, may cause irritation. The foods, beverages, or seasonings to avoid should be based on individual tolerance. What foods should I eat? Fruits Canned or cooked fruit such as peaches, pears, or applesauce. Bananas. Vegetables Well-cooked vegetables. Canned or cooked vegetables such as carrots, green beans, beets, or spinach. Mashed or boiled potatoes. Grains  Hot cereals, such as cream of wheat and processed oatmeal. Rice. Bread, crackers, pasta, or tortillas made from refined white flour. Meats and other proteins  Eggs. Creamy peanut butter or other nut butters. Lean, well-cooked tender meats, such as beef, pork, chicken, or fish. Dairy Low-fat dairy products such as milk, cottage cheese, or yogurt. Beverages  Water. Herbal tea. Apple  juice. Fats and oils Mild salad dressings. Canola or olive oil. Sweets and desserts Low-fat pudding, custard, or ice cream. Fruit gelatin. The items listed above may not be a complete list  of foods and beverages you can eat. Contact a dietitian for more information. What foods should I avoid? Fruits Citrus fruits, such as oranges and grapefruit. Fruits with a stringy texture. Fruits that have lots of seeds, such as kiwi or strawberries. Dried fruits. Vegetables Raw, uncooked vegetables. Salads. Grains Whole grain breads, muffins, and cereals. Meats and other proteins Tough, fibrous meats. Highly seasoned meat such as corned beef, smoked meats, or fish. Processed high-fat meats such as brats, hot dogs, or sausage. Dairy Full-fat dairy foods such as ice cream and cheese. Beverages Caffeinated drinks. Alcohol. Seasonings and condiments Strongly flavored seasonings or condiments. Hot sauce. Salsa. Other foods Spicy foods. Fried or greasy foods. Sour foods, such as pickled or fermented foods like sauerkraut. Foods high in fiber. The items listed above may not be a complete list of foods and beverages you should avoid. Contact a dietitian for more information. Summary A bland diet should be based on individual tolerance. It may consist of foods that are soft textured and do not have a lot of fat, fiber, acid, or seasonings. A bland diet may be recommended because avoiding certain foods, beverages, or spices may make you feel better. This information is not intended to replace advice given to you by your health care provider. Make sure you discuss any questions you have with your health care provider. Document Revised: 07/29/2021 Document Reviewed: 07/29/2021 Elsevier Patient Education  2024 [employer].

## 2023-10-13 NOTE — Progress Notes (Unsigned)
[name] [address] [place] [address] Phone: [phone] Subjective:   Michael [name], am serving as a scribe for Dr. Antoine Primas.  I'm seeing this patient by the request  of:  Shelva Majestic, MD  CC: Back and neck pain follow-up  [id number]  Michael [name] is a 68 y.o. male coming in with complaint of back and neck pain. OMT 08/25/2023. R knee pain. Patient states that he is not having pain in the R knee but "sensations" that are it is different from L knee.   Back has not been bothering him as much as usual but he is exercising less.   Medications patient has been prescribed: None  Taking:         Reviewed prior external information including notes and imaging from previsou exam, outside providers and external EMR if available.   As well as notes that were available from care everywhere and other healthcare systems.  Recently was seen in the emergency department where patient did have chest pain that seem to be worse with exertion.  Had catheterization done  Past medical history, social, surgical and family history all reviewed in electronic medical record.  No pertanent information unless stated regarding to the chief complaint.   Past Medical History:  Diagnosis Date   Acute lateral meniscal tear, right, initial encounter 09/26/2021   Injection given September 26, 2018.   CAD S/P PCI to Cx & RCA    a. s/p PCI of LCx (2006) and ostium of RCA (2007) as well as DES to RCA in [place] (05/2014)   HTN (hypertension)    Hyperlipidemia    Plantar fasciitis 10/12/2017    No Known Allergies   Review of Systems:  No headache, visual changes, nausea, vomiting, diarrhea, constipation, dizziness, abdominal pain, skin rash, fevers, chills, night sweats, weight loss, swollen lymph nodes, body aches, joint swelling, chest pain, shortness of breath, mood changes. POSITIVE muscle aches  Objective  Blood pressure 120/78,  pulse 79, height 5\' 6"  (1.676 m), weight 190 lb (86.2 kg), SpO2 99%.   General: No apparent distress alert and oriented x3 mood and affect normal, dressed appropriately.  HEENT: Pupils equal, extraocular movements intact  Respiratory: Patient's speak in full sentences and does not appear short of breath  Cardiovascular: No lower extremity edema, non tender, no erythema  Gait relatively normal right knee does not have any significant tenderness with only a trace effusion noted the patellofemoral joint.  She MSK:  Back does have some loss lordosis noted.  Tightness noted in the thoracolumbar juncture in the sacroiliac joint.  Osteopathic findings  C2 flexed rotated and side bent right T3 extended rotated and side bent right inhaled rib T5 extended rotated and side bent left L1 flexed rotated and side bent right L3 flexed rotated and side bent left Sacrum right on right    Assessment and Plan:  SI (sacroiliac) joint dysfunction Tightness still noted at this time.  Discussed icing regimen and home exercises, increase activity slowly.  Follow-up again in 6 to 8 weeks  Degenerative arthritis of right knee Patient feels like it is stable at the moment.  Would like to not do the injection at the moment.  Going on nearly 2 years.    Nonallopathic problems  Decision today to treat with OMT was based on Physical Exam  After verbal consent patient was treated with HVLA, ME, FPR techniques in cervical, rib, thoracic, lumbar, and sacral  areas  Patient tolerated the procedure well with improvement in symptoms  Patient given exercises, stretches and lifestyle modifications  See medications in patient instructions if given  Patient will follow up in 4-8 weeks    The above documentation has been reviewed and is accurate and complete Judi Saa, DO          Note: This dictation was prepared with Dragon dictation along with smaller phrase technology. Any transcriptional errors  that result from this process are unintentional.

## 2023-10-14 ENCOUNTER — Ambulatory Visit (INDEPENDENT_AMBULATORY_CARE_PROVIDER_SITE_OTHER): Payer: Medicare Other | Admitting: Family Medicine

## 2023-10-14 ENCOUNTER — Encounter: Payer: Self-pay | Admitting: Family Medicine

## 2023-10-14 VITALS — BP 120/78 | HR 79 | Ht 66.0 in | Wt 190.0 lb

## 2023-10-14 DIAGNOSIS — M1711 Unilateral primary osteoarthritis, right knee: Secondary | ICD-10-CM | POA: Diagnosis not present

## 2023-10-14 DIAGNOSIS — M9908 Segmental and somatic dysfunction of rib cage: Secondary | ICD-10-CM

## 2023-10-14 DIAGNOSIS — M9901 Segmental and somatic dysfunction of cervical region: Secondary | ICD-10-CM | POA: Diagnosis not present

## 2023-10-14 DIAGNOSIS — M9903 Segmental and somatic dysfunction of lumbar region: Secondary | ICD-10-CM

## 2023-10-14 DIAGNOSIS — M533 Sacrococcygeal disorders, not elsewhere classified: Secondary | ICD-10-CM | POA: Diagnosis not present

## 2023-10-14 DIAGNOSIS — M9902 Segmental and somatic dysfunction of thoracic region: Secondary | ICD-10-CM

## 2023-10-14 DIAGNOSIS — M9904 Segmental and somatic dysfunction of sacral region: Secondary | ICD-10-CM

## 2023-10-14 NOTE — Assessment & Plan Note (Signed)
Patient feels like it is stable at the moment.  Would like to not do the injection at the moment.  Going on nearly 2 years.

## 2023-10-14 NOTE — Patient Instructions (Signed)
Hold on knee injection See me in 6-8 weeks

## 2023-10-14 NOTE — Assessment & Plan Note (Signed)
Tightness still noted at this time.  Discussed icing regimen and home exercises, increase activity slowly.  Follow-up again in 6 to 8 weeks

## 2023-10-19 ENCOUNTER — Other Ambulatory Visit: Payer: Self-pay | Admitting: Cardiology

## 2023-10-21 ENCOUNTER — Other Ambulatory Visit: Payer: Self-pay | Admitting: Cardiology

## 2023-10-21 ENCOUNTER — Encounter: Payer: Self-pay | Admitting: Family Medicine

## 2023-10-22 NOTE — Telephone Encounter (Signed)
Please see pt response as Michael [name]

## 2023-10-26 ENCOUNTER — Ambulatory Visit (INDEPENDENT_AMBULATORY_CARE_PROVIDER_SITE_OTHER): Payer: Medicare Other | Admitting: Family Medicine

## 2023-10-26 ENCOUNTER — Encounter: Payer: Self-pay | Admitting: Family Medicine

## 2023-10-26 VITALS — BP 110/80 | HR 67 | Temp 97.0°F | Ht 66.0 in | Wt 189.6 lb

## 2023-10-26 DIAGNOSIS — I2510 Atherosclerotic heart disease of native coronary artery without angina pectoris: Secondary | ICD-10-CM | POA: Diagnosis not present

## 2023-10-26 DIAGNOSIS — E785 Hyperlipidemia, unspecified: Secondary | ICD-10-CM

## 2023-10-26 DIAGNOSIS — I10 Essential (primary) hypertension: Secondary | ICD-10-CM | POA: Diagnosis not present

## 2023-10-26 DIAGNOSIS — R7303 Prediabetes: Secondary | ICD-10-CM | POA: Diagnosis not present

## 2023-10-26 DIAGNOSIS — Z9861 Coronary angioplasty status: Secondary | ICD-10-CM

## 2023-10-26 NOTE — Progress Notes (Signed)
Phone [phone] In person visit   Subjective:   Michael [name] is a 68 y.o. year old very pleasant male patient who presents for/with See problem oriented charting Chief Complaint  Patient presents with   Medical Management of Chronic Issues   Hypertension   Past Medical History-  Patient Active Problem List   Diagnosis Date Noted   Ischemic cardiomyopathy 12/05/2019    Priority: High   CAD S/P multiple PCIs 09/08/2014    Priority: High   History of adenomatous polyp of colon 06/07/2018    Priority: Medium    BPH (benign prostatic hyperplasia) 06/07/2018    Priority: Medium    Impaired glucose tolerance 11/03/2016    Priority: Medium    HTN (hypertension)     Priority: Medium    Dyslipidemia 09/08/2014    Priority: Medium    Left shoulder pain 01/24/2021    Priority: Low   AC (acromioclavicular) arthritis 01/24/2021    Priority: Low   Vitiligo 06/07/2018    Priority: Low   Gluteal pain 04/10/2018    Priority: Low   Back pain 04/13/2017    Priority: Low   Nonallopathic lesion of thoracic region 04/13/2017    Priority: Low   Nonallopathic lesion of sacral region 04/13/2017    Priority: Low   Nonallopathic lesion of lumbosacral region 04/13/2017    Priority: Low   Obesity     Priority: Low   Mixed hyperlipidemia 10/01/2023   Benign hypertension 10/01/2023   Acute bursitis of right shoulder 08/25/2023   Achilles tendinosis of left ankle 07/13/2023   SI (sacroiliac) joint dysfunction 09/02/2022   Gluteal tendinitis of right buttock 03/04/2022   Coronary artery disease with stable angina pectoris, unspecified vessel or lesion type, unspecified whether native or transplanted heart (HCC) 02/06/2022   Degenerative arthritis of right knee 11/15/2021   Cervical disc disorder with radiculopathy of cervical region 03/15/2021   Abnormal chest x-ray 01/24/2021   Right carpal tunnel syndrome 09/05/2020   Perianal irritation 06/08/2020   Unstable angina (HCC)      Medications- reviewed and updated Current Outpatient Medications  Medication Sig Dispense Refill   aspirin 81 MG tablet Take 81 mg by mouth daily.     atorvastatin (LIPITOR) 80 MG tablet TAKE 1 TABLET DAILY 90 tablet 3   CHERRY PO Take 50 mg by mouth daily. Cherry extract     Cinnamon 500 MG capsule Take 500 mg by mouth daily.     Coenzyme Q10 (Q-SORB CO Q-10) 100 MG capsule Take 1 capsule by mouth daily.     famotidine (PEPCID) 40 MG tablet Take 1 tablet (40 mg total) by mouth daily. 90 tablet 3   isosorbide mononitrate (IMDUR) 60 MG 24 hr tablet TAKE 1 TABLET DAILY 90 tablet 3   nitroGLYCERIN (NITROSTAT) 0.4 MG SL tablet DISSOLVE 1 TABLET UNDER THE TONGUE EVERY 5 MINUTES AS NEEDED FOR CHEST PAIN UP TO 3 DOSES (Patient taking differently: Place 0.4 mg under the tongue every 5 (five) minutes as needed for chest pain. /Patient took 1 tablet yesterday,  10/01/23.) 25 tablet 1   ranolazine (RANEXA) 500 MG 12 hr tablet Take 1 tablet (500 mg total) by mouth 2 (two) times daily. 60 tablet 5   ticagrelor (BRILINTA) 60 MG TABS tablet Take 1 tablet (60 mg total) by mouth 2 (two) times daily. 180 tablet 3   Turmeric 500 MG CAPS Take 500 mg by mouth daily.     No current facility-administered medications for this visit.  Objective:  BP 110/80   Pulse 67   Temp (!) 97 F (36.1 C)   Ht 5\' 6"  (1.676 m)   Wt 189 lb 9.6 oz (86 kg)   SpO2 98%   BMI 30.60 kg/m  Gen: NAD, resting comfortably CV: RRR no murmurs rubs or gallops Lungs: CTAB no crackles, wheeze, rhonchi Ext: trace edema Skin: warm, dry     Assessment and Plan   #Next labs- need LFTs and B12  #CAD with angina-sees Dr. Antoine Poche  #hyperlipidemia-LDL goal under 63 S: Medication: Atorvastatin 80 mg, aspirin 81 mg, Brilinta 90 mg, Imdur 60 mg, Ranexa 500 mg twice daily, nitroglycerin on hand - tried before cath lab but didn't make a difference  -Lipoprotein a not elevated   -Most recent catheterization 10/02/2023 primarily  microvascular disease/dysfunction-per cardiology notes " suggest withdrawing nitrate therapy and up titrating doses of ARB's, calcium channel blockers, and beta-blockers." He reflects back and wonders if symptoms before this were related to acid reflux Current symptoms:  no recent chest pain - other than he thinks one time with reflux- no shortness of breath  - back at gym a week after getting out of hospital- has gone twice and no issues Lab Results  Component Value Date   CHOL 122 10/02/2023   HDL 49 10/02/2023   LDLCALC 55 10/02/2023   LDLDIRECT 62.0 09/14/2019   TRIG 90 10/02/2023   CHOLHDL 2.5 10/02/2023   A/P: For CAD- asymptomatic currently now on ranexa after cath- doing much better- continue current medications   For hyperlipidemia- at goal recently- continue current medications. Thankful lpa not elevated   # Ischemic cardiomyopathy-echocardiogram with ejection fraction of 45 to 50% with regional wall motion abnormalities consistent with previous myocardial infarction's   #hypertension S: medication: Imdur 60 mg BP Readings from Last 3 Encounters:  10/26/23 110/80  10/14/23 120/78  10/13/23 130/76  A/P: stable- continue current medicines     # Hyperglycemia/insulin resistance/prediabetes-peak A1c 6.3 S:  Medication: none Exercise and diet- as above restarted exercise- feels could watch carbohydrates. Weight roughtly stable from last visit Lab Results  Component Value Date   HGBA1C 6.2 (H) 10/01/2023   HGBA1C 6.0 03/10/2023   HGBA1C 5.9 02/06/2022  A/P: prediabetes up slightly- had to take prednisone in recent months which may have increased this- hoping to come back down over next 6 months   # B12 deficiency S: Current treatment/medication (oral vs. IM): Patient with low under 200 November 2021-treated with 4 weeks of B12 injections.  At present taking B12 every other day  Lab Results  Component Value Date   VITAMINB12 229 03/10/2023  A/P: check B12 next visit- suspect  improved   # GERD-medication: Pepcid 20 g twice daily   #prior rash/itching- doing better with change in protein drink and cutting down on vitamin D. Also wonders if chlorine at gym affecting him.   Recommended follow up: Return in about 6 months (around 04/24/2024) for followup or sooner if needed.Schedule b4 you leave. Future Appointments  Date Time Provider Department Center  12/09/2023  8:30 AM Judi Saa, DO [facility] None  12/24/2023  8:00 AM Rollene Rotunda, MD [facility] None  04/04/2024  8:45 AM [facility] ANNUAL WELLNESS VISIT 1 [facility]    Lab/Order associations:   ICD-10-CM   1. CAD S/P multiple PCIs  I25.10    Z98.61     2. Primary hypertension  I10     3. Dyslipidemia  E78.5     4. Prediabetes  R73.03  No orders of the defined types were placed in this encounter.   Return precautions advised.  Tana Conch, MD

## 2023-10-26 NOTE — Patient Instructions (Addendum)
Glad you are doing better- no changes today- update other bloodwork next visit- if you can bring name of test we can look at together  Recommended follow up: Return in about 6 months (around 04/24/2024) for followup or sooner if needed.Schedule b4 you leave.

## 2023-12-03 NOTE — Progress Notes (Signed)
 [name] [address] [place] [address] Phone: [phone] Subjective:   [name], am serving as a scribe for Dr. Antoine Primas.  I'm seeing this patient by the request  of:  Shelva Majestic, MD  CC: Back and neck pain follow-up  [id number]  Quantel Mcinturff is a 68 y.o. male coming in with complaint of back and neck pain. OMT 10/14/2023. Patient states that his R shoulder pain has moved to anterior aspect of shoulder. Painful to flex arm and sleep on that side. Injection did help pain in back of arm. Pain can radiate into tricep.   Medications patient has been prescribed: None  Taking:         Reviewed prior external information including notes and imaging from previsou exam, outside providers and external EMR if available.   As well as notes that were available from care everywhere and other healthcare systems.  Past medical history, social, surgical and family history all reviewed in electronic medical record.  No pertanent information unless stated regarding to the chief complaint.   Past Medical History:  Diagnosis Date   Acute lateral meniscal tear, right, initial encounter 09/26/2021   Injection given September 26, 2018.   CAD S/P PCI to Cx & RCA    a. s/p PCI of LCx (2006) and ostium of RCA (2007) as well as DES to RCA in [place] (05/2014)   HTN (hypertension)    Hyperlipidemia    Plantar fasciitis 10/12/2017    No Known Allergies   Review of Systems:  No headache, visual changes, nausea, vomiting, diarrhea, constipation, dizziness, abdominal pain, skin rash, fevers, chills, night sweats, weight loss, swollen lymph nodes, body aches, joint swelling, chest pain, shortness of breath, mood changes. POSITIVE muscle aches  Objective  Blood pressure 124/68, pulse 64, height 5\' 6"  (1.676 m), SpO2 98%.   General: No apparent distress alert and oriented x3 mood and affect normal, dressed appropriately.   HEENT: Pupils equal, extraocular movements intact  Respiratory: Patient's speak in full sentences and does not appear short of breath  Cardiovascular: No lower extremity edema, non tender, no erythema  Gait normal MSK:  Back does have some loss lordosis noted.  Neck exam does have some limited sidebending bilaterally.  Right shoulder exam does have a positive crossover noted.  Severe tenderness to palpation over the acromioclavicular joint.  Osteopathic findings C6 flexed rotated and side bent left T3 extended rotated and side bent right inhaled rib T6 extended rotated and side bent left L2 flexed rotated and side bent right Sacrum right on right   Procedure: Real-time Ultrasound Guided Injection of right acromioclavicular joint Device: GE Logiq Q7 Ultrasound guided injection is preferred based studies that show increased duration, increased effect, greater accuracy, decreased procedural pain, increased response rate, and decreased cost with ultrasound guided versus blind injection.  Verbal informed consent obtained.  Time-out conducted.  Noted no overlying erythema, induration, or other signs of local infection.  Skin prepped in a sterile fashion.  Local anesthesia: Topical Ethyl chloride.  With sterile technique and under real time ultrasound guidance: With a 25-gauge half inch needle injected with 0.5 cc of 0.5% Marcaine and 0.5 cc of Kenalog 40 mg/mL Completed without difficulty  Pain immediately resolved suggesting accurate placement of the medication.  Advised to call if fevers/chills, erythema, induration, drainage, or persistent bleeding.  Impression: Technically successful ultrasound guided injection.    Assessment and Plan:  AC (acromioclavicular) arthritis Injected today  and tolerated the procedure well.  Acute on chronic exacerbation.  Discussed with patient about icing regimen and home exercises, discussed which activities to do and which ones to avoid.  Increase  activity slowly over the course of next several weeks.  Discussed icing regimen.  I do feel that with the patient having this pain intermittently for over 2-1/2 years advanced imaging would be warranted if this does seem to come back on a more severe basis.  Cervical disc disorder with radiculopathy of cervical region Known arthritic changes and could be potentially contributing to some of the shoulder pain.  We will continue to monitor.  Discussed icing regimen and home exercises, discussed which activities to do and which ones to avoid.  Increase activity slowly otherwise.  Follow-up again in 6 to 8 weeks    Nonallopathic problems  Decision today to treat with OMT was based on Physical Exam  After verbal consent patient was treated with HVLA, ME, FPR techniques in cervical, rib, thoracic, lumbar, and sacral  areas  Patient tolerated the procedure well with improvement in symptoms  Patient given exercises, stretches and lifestyle modifications  See medications in patient instructions if given  Patient will follow up in 4-8 weeks     The above documentation has been reviewed and is accurate and complete Judi Saa, DO         Note: This dictation was prepared with Dragon dictation along with smaller phrase technology. Any transcriptional errors that result from this process are unintentional.

## 2023-12-09 ENCOUNTER — Ambulatory Visit: Payer: Medicare Other | Admitting: Family Medicine

## 2023-12-09 ENCOUNTER — Encounter: Payer: Self-pay | Admitting: Family Medicine

## 2023-12-09 ENCOUNTER — Ambulatory Visit (INDEPENDENT_AMBULATORY_CARE_PROVIDER_SITE_OTHER)

## 2023-12-09 ENCOUNTER — Other Ambulatory Visit: Payer: Self-pay

## 2023-12-09 VITALS — BP 124/68 | HR 64 | Ht 66.0 in

## 2023-12-09 DIAGNOSIS — M9903 Segmental and somatic dysfunction of lumbar region: Secondary | ICD-10-CM

## 2023-12-09 DIAGNOSIS — M5010 Cervical disc disorder with radiculopathy, unspecified cervical region: Secondary | ICD-10-CM | POA: Diagnosis not present

## 2023-12-09 DIAGNOSIS — M9904 Segmental and somatic dysfunction of sacral region: Secondary | ICD-10-CM

## 2023-12-09 DIAGNOSIS — M9901 Segmental and somatic dysfunction of cervical region: Secondary | ICD-10-CM | POA: Diagnosis not present

## 2023-12-09 DIAGNOSIS — M19011 Primary osteoarthritis, right shoulder: Secondary | ICD-10-CM | POA: Diagnosis not present

## 2023-12-09 DIAGNOSIS — M9908 Segmental and somatic dysfunction of rib cage: Secondary | ICD-10-CM | POA: Diagnosis not present

## 2023-12-09 DIAGNOSIS — M25511 Pain in right shoulder: Secondary | ICD-10-CM

## 2023-12-09 DIAGNOSIS — M9902 Segmental and somatic dysfunction of thoracic region: Secondary | ICD-10-CM

## 2023-12-09 NOTE — Patient Instructions (Addendum)
 Xray today Injected AC jt today Send update in 2-3 weeks See me in 7-8 weeks

## 2023-12-09 NOTE — Assessment & Plan Note (Signed)
 Injected today and tolerated the procedure well.  Acute on chronic exacerbation.  Discussed with patient about icing regimen and home exercises, discussed which activities to do and which ones to avoid.  Increase activity slowly over the course of next several weeks.  Discussed icing regimen.  I do feel that with the patient having this pain intermittently for over 2-1/2 years advanced imaging would be warranted if this does seem to come back on a more severe basis.

## 2023-12-09 NOTE — Assessment & Plan Note (Signed)
 Known arthritic changes and could be potentially contributing to some of the shoulder pain.  We will continue to monitor.  Discussed icing regimen and home exercises, discussed which activities to do and which ones to avoid.  Increase activity slowly otherwise.  Follow-up again in 6 to 8 weeks

## 2023-12-20 DIAGNOSIS — I10 Essential (primary) hypertension: Secondary | ICD-10-CM | POA: Insufficient documentation

## 2023-12-20 NOTE — Progress Notes (Unsigned)
 Cardiology Office Note:   Date:  12/24/2023  ID:  Michael [name], DOB [birth year], MRN [id number] PCP: Shelva Majestic, MD  [facility] Providers Cardiologist:  Rollene Rotunda, MD {  History of Present Illness:   Michael [name] is a 68 y.o. male  male with CAD, HTN and HLD.  He moved from [place].  Cardiac catheterization done prior to moving demonstrated a right coronary artery lesion that was treated with DES.  In September 2016, he was admitted for unstable angina.  Myoview was low risk.  He had angina again in August 2017 and underwent cardiac catheterization that showed patent stent in the RCA and left circumflex with disease in mid LAD.  He received 2 additional drug-eluting stents.  He was discharged but admitted later with similar chest pain and a troponin elevation a week after discharge.  Relook catheter showed patent LAD stents, left circumflex stent and RCA stent.  There was a 70% small diagonal lesion.  It was suspected he likely had coronary spasm.  Nitrate was added. He was readmitted to the hospital on 12/16/2018 with chest pain.  CT angiogram of the chest was negative for PE.  Myoview obtained on 12/17/2018 was intermediate risk study, finding showed large defect of severe severity present in the inferolateral wall consistent with scar, EF 38% with inferior anterolateral akinesis.  This was followed up using an echocardiogram on the same day which showed EF 40 to 45%, severe akinesis of the entire inferior wall.  No further study was recommended.  He was continued on aspirin, statin and Imdur at 60 mg daily.   Since I last saw him he was in the hospital with chest pain.  He had a cardiac catheterization that demonstrated previously stents were widely patent.  His EF remains at 45 to 50%.  There was basal to mid inferior and inferolateral hypokinesis unchanged from previous.     ROS: As stated in the HPI and negative for all other systems.  Studies  Reviewed:    EKG:   NA  Risk Assessment/Calculations:           Physical Exam:   VS:  BP 120/71   Pulse 68   Ht 5\' 6"  (1.676 m)   Wt 192 lb 3.2 oz (87.2 kg)   SpO2 98%   BMI 31.02 kg/m    Wt Readings from Last 3 Encounters:  12/24/23 192 lb 3.2 oz (87.2 kg)  10/26/23 189 lb 9.6 oz (86 kg)  10/14/23 190 lb (86.2 kg)     GEN: Well nourished, well developed in no acute distress NECK: No JVD; No carotid bruits CARDIAC: RRR, no murmurs, rubs, gallops RESPIRATORY:  Clear to auscultation without rales, wheezing or rhonchi  ABDOMEN: Soft, non-tender, non-distended EXTREMITIES:  No edema; No deformity   ASSESSMENT AND PLAN:   Ischemic cardiomyopathy:   His ejection fraction was low normal in 2023.  This is unchanged.  He is euvolemic.  Of note he has not tolerated beta-blockers or ACE inhibitors in the past as he has become hypotensive.  His EF has remained stable.    CAD:  The patient has no new sypmtoms.  No further cardiovascular testing is indicated.  We will continue with aggressive risk reduction and meds as listed.  We are intentionally using DAPT given the extent of his previous stenting. [name]  Hypertension:   BP is at target.  No change in therapy.  Starting weight.Hyperlipidemia:  LDL was 55.  No change in therapy.  Follow up with me in one year.   Signed, Rollene Rotunda, MD

## 2023-12-23 ENCOUNTER — Encounter: Payer: Self-pay | Admitting: Family Medicine

## 2023-12-24 ENCOUNTER — Ambulatory Visit: Payer: Medicare Other | Attending: Cardiology | Admitting: Cardiology

## 2023-12-24 ENCOUNTER — Encounter: Payer: Self-pay | Admitting: Cardiology

## 2023-12-24 VITALS — BP 120/71 | HR 68 | Ht 66.0 in | Wt 192.2 lb

## 2023-12-24 DIAGNOSIS — I10 Essential (primary) hypertension: Secondary | ICD-10-CM | POA: Insufficient documentation

## 2023-12-24 DIAGNOSIS — I255 Ischemic cardiomyopathy: Secondary | ICD-10-CM | POA: Insufficient documentation

## 2023-12-24 DIAGNOSIS — I2510 Atherosclerotic heart disease of native coronary artery without angina pectoris: Secondary | ICD-10-CM | POA: Insufficient documentation

## 2023-12-24 DIAGNOSIS — E785 Hyperlipidemia, unspecified: Secondary | ICD-10-CM | POA: Diagnosis present

## 2023-12-24 NOTE — Patient Instructions (Signed)
 Medication Instructions:  No changes.  *If you need a refill on your cardiac medications before your next appointment, please call your pharmacy*   Follow-Up: At [facility], you and your health needs are our priority.  As part of our continuing mission to provide you with exceptional heart care, our providers are all part of one team.  This team includes your primary Cardiologist (physician) and Advanced Practice Providers or APPs (Physician Assistants and Nurse Practitioners) who all work together to provide you with the care you need, when you need it.  Your next appointment:   1 year(s)  Provider:   Rollene Rotunda, MD     We recommend signing up for the patient portal called "MyChart".  Sign up information is provided on this After Visit Summary.  MyChart is used to connect with patients for Virtual Visits (Telemedicine).  Patients are able to view lab/test results, encounter notes, upcoming appointments, etc.  Non-urgent messages can be sent to your provider as well.   To learn more about what you can do with MyChart, go to [email or web address].   Other Instructions       1st Floor: - Lobby - Registration  - Pharmacy  - Lab - Cafe  2nd Floor: - PV Lab - Diagnostic Testing (echo, CT, nuclear med)  3rd Floor: - Vacant  4th Floor: - TCTS (cardiothoracic surgery) - AFib Clinic - Structural Heart Clinic - Vascular Surgery  - Vascular Ultrasound  5th Floor: - HeartCare Cardiology (general and EP) - Clinical Pharmacy for coumadin, hypertension, lipid, weight-loss medications, and med management appointments    Valet parking services will be available as well.

## 2024-01-26 NOTE — Progress Notes (Unsigned)
 Michael [name] [address] [place] [address] Phone: [phone] Subjective:   Michael [name], am serving as a scribe for Dr. Ronnell Coins.  I'm seeing this patient by the request  of:  Michael Jaeger, MD  CC: Right shoulder pain  [id number]  Michael [name] is a 68 y.o. male coming in with complaint of back and neck pain. OMT 12/09/2023. Also f/u for R shoulder pain. Patient states that his shoulder is not getting much better. Pain radiates down the bicep. Does have some back soreness with core exercises.   Medications patient has been prescribed: None  Taking:         Reviewed prior external information including notes and imaging from previsou exam, outside providers and external EMR if available.   As well as notes that were available from care everywhere and other healthcare systems.  Past medical history, social, surgical and family history all reviewed in electronic medical record.  No pertanent information unless stated regarding to the chief complaint.   Past Medical History:  Diagnosis Date   Acute lateral meniscal tear, right, initial encounter 09/26/2021   Injection given September 26, 2018.   CAD S/P PCI to Cx & RCA    a. s/p PCI of LCx (2006) and ostium of RCA (2007) as well as DES to RCA in [place] (05/2014)   HTN (hypertension)    Hyperlipidemia    Plantar fasciitis 10/12/2017    No Known Allergies   Review of Systems:  No headache, visual changes, nausea, vomiting, diarrhea, constipation, dizziness, abdominal pain, skin rash, fevers, chills, night sweats, weight loss, swollen lymph nodes, body aches, joint swelling, chest pain, shortness of breath, mood changes. POSITIVE muscle aches  Objective  Blood pressure 122/82, pulse 72, height 5\' 6"  (1.676 m), weight 194 lb (88 kg), SpO2 100%.   General: No apparent distress alert and oriented x3 mood and affect normal, dressed appropriately.  HEENT: Pupils  equal, extraocular movements intact  Respiratory: Patient's speak in full sentences and does not appear short of breath  Cardiovascular: No lower extremity edema, non tender, no erythema  Right shoulder does have positive impingement noted.  Rotator cuff strength 4 out of 5.  Neck exam does have a positive Spurling's noted.  Lacks last 5 degrees of extension of the neck also noted.  Limited muscular skeletal ultrasound was performed and interpreted by Ronnell Coins, M  Limited ultrasound of very difficult to assess patient's rotator cuff secondary to the hypoechoic changes are noted.  Some arthritic changes noted of the acromioclavicular joint definitely is noted. Interval worsening noted to some of the inflammation and swelling.     Assessment and Plan:  AC (acromioclavicular) arthritis Worsening symptoms that does not seem to be responding to the conservative therapy anymore.  Had more of a bursitis but seems to be worsening and need to make sure that there is not a rotator cuff pathology.  Concern now that patient is also having radicular symptoms that could be secondary to more of the neck.  Positive Spurling's with weakness noted.  Has failed well over a year of conservative therapy at this point.  Do feel that a MRI of the shoulder as well as the neck would be beneficial to help aid in further treatment.  Would be a candidate for an epidural.  Could be a candidate for PRP of the shoulder as well if necessary.  Total time with patient today discussing with patient not including  the ultrasound and 33 minutes       The above documentation has been reviewed and is accurate and complete Michael [name] M Michael Everett, DO         Note: This dictation was prepared with Dragon dictation along with smaller phrase technology. Any transcriptional errors that result from this process are unintentional.

## 2024-01-27 ENCOUNTER — Other Ambulatory Visit: Payer: Self-pay

## 2024-01-27 ENCOUNTER — Ambulatory Visit: Admitting: Family Medicine

## 2024-01-27 ENCOUNTER — Encounter: Payer: Self-pay | Admitting: Family Medicine

## 2024-01-27 VITALS — BP 122/82 | HR 72 | Ht 66.0 in | Wt 194.0 lb

## 2024-01-27 DIAGNOSIS — M25511 Pain in right shoulder: Secondary | ICD-10-CM | POA: Diagnosis not present

## 2024-01-27 DIAGNOSIS — M5010 Cervical disc disorder with radiculopathy, unspecified cervical region: Secondary | ICD-10-CM

## 2024-01-27 DIAGNOSIS — M542 Cervicalgia: Secondary | ICD-10-CM

## 2024-01-27 DIAGNOSIS — M19011 Primary osteoarthritis, right shoulder: Secondary | ICD-10-CM

## 2024-01-27 NOTE — Assessment & Plan Note (Signed)
 Worsening symptoms that does not seem to be responding to the conservative therapy anymore.  Had more of a bursitis but seems to be worsening and need to make sure that there is not a rotator cuff pathology.  Concern now that patient is also having radicular symptoms that could be secondary to more of the neck.  Positive Spurling's with weakness noted.  Has failed well over a year of conservative therapy at this point.  Do feel that a MRI of the shoulder as well as the neck would be beneficial to help aid in further treatment.  Would be a candidate for an epidural.  Could be a candidate for PRP of the shoulder as well if necessary.  Total time with patient today discussing with patient not including the ultrasound and 33 minutes

## 2024-01-27 NOTE — Patient Instructions (Signed)
 See me in 6-8 weeks MRI R shoulder Johna Myers

## 2024-01-30 ENCOUNTER — Ambulatory Visit

## 2024-01-30 DIAGNOSIS — M25511 Pain in right shoulder: Secondary | ICD-10-CM

## 2024-01-30 DIAGNOSIS — M542 Cervicalgia: Secondary | ICD-10-CM | POA: Diagnosis not present

## 2024-02-03 ENCOUNTER — Encounter: Payer: Self-pay | Admitting: Family Medicine

## 2024-02-29 ENCOUNTER — Ambulatory Visit: Payer: Self-pay | Admitting: Family Medicine

## 2024-03-08 NOTE — Progress Notes (Signed)
 [facility] [address] [place] [address] Phone: [phone] Subjective:   Michael [name], am serving as a scribe for Dr. Ronnell Coins.  I'm seeing this patient by the request  of:  Almira Jaeger, MD  CC: We are she is oh I think she has yet I think she is now that you said that I think she is at the beach or some  LKG:[id number] data this must be 1 01/27/2024 Worsening symptoms that does not seem to be responding to the conservative therapy anymore.  Had more of a bursitis but seems to be worsening and need to make sure that there is not a rotator cuff pathology.  Concern now that patient is also having radicular symptoms that could be secondary to more of the neck.  Positive Spurling's with weakness noted.  Has failed well over a year of conservative therapy at this point.  Do feel that a MRI of the shoulder as well as the neck would be beneficial to help aid in further treatment.  Would be a candidate for an epidural.  Could be a candidate for PRP of the shoulder as well if necessary.  Total time with patient today discussing with patient not including the ultrasound and 33 minutes     Updated 03/09/2024 [employer]elle [employer] Bayard is a 68 y.o. male coming in with complaint of R shoulder pain. Patient continues to have pain with extension and ER. Patient feels that pain is inhibiting him from being active.  T  Pain in lower back that radiates across back. Body will get sore from putting small items together.        Past Medical History:  Diagnosis Date   Acute lateral meniscal tear, right, initial encounter 09/26/2021   Injection given September 26, 2018.   CAD S/P PCI to Cx & RCA    a. s/p PCI of LCx (2006) and ostium of RCA (2007) as well as DES to RCA in [place] (05/2014)   HTN (hypertension)    Hyperlipidemia    Plantar fasciitis 10/12/2017   Past Surgical History:  Procedure Laterality Date   CARDIAC CATHETERIZATION N/A 05/06/2016    Procedure: Left Heart Cath and Coronary Angiography;  Surgeon: Sammy Crisp, MD;  [facility]: [facility] INVASIVE CV LAB;  Service: Cardiovascular;  Laterality: N/A;   CARDIAC CATHETERIZATION N/A 05/06/2016   Procedure: Coronary Stent Intervention;  Surgeon: Sammy Crisp, MD;  Location: [facility] INVASIVE CV LAB;  Service: Cardiovascular;  Laterality: N/A;   CARDIAC CATHETERIZATION N/A 05/13/2016   Procedure: Coronary/Graft Angiography;  Surgeon: Peter M Swaziland, MD;  Location[facility] INVASIVE CV LAB;  Service: Cardiovascular;  Laterality: N/A;   CORONARY ANGIOPLASTY WITH STENT PLACEMENT  2006   in [place] , occluded CFX, s/p Taxus stent   CORONARY ANGIOPLASTY WITH STENT PLACEMENT  2007   in [place] , ostial RCA stent   CORONARY ANGIOPLASTY WITH STENT PLACEMENT  2015   in [place] , RCA stent   CORONARY PRESSURE/FFR STUDY N/A 10/02/2023   Procedure: CORONARY PRESSURE/FFR STUDY;  Surgeon: Kyra Phy, MD;  Location: [facility] INVASIVE CV LAB;  Service: Cardiovascular;  Laterality: N/A;   LEFT HEART CATH AND CORONARY ANGIOGRAPHY N/A 10/02/2023   Procedure: LEFT HEART CATH AND CORONARY ANGIOGRAPHY;  Surgeon: Kyra Phy, MD;  Location: [facility] INVASIVE CV LAB;  Service: Cardiovascular;  Laterality: N/A;   TONSILLECTOMY AND ADENOIDECTOMY      Social History   Socioeconomic History   Marital status: Married    Spouse  name: Not on file   Number of children: 4   Years of education: Not on file   Highest education level: Not on file  Occupational History   Occupation: retired  Tobacco Use   Smoking status: Never   Smokeless tobacco: Never  Vaping Use   Vaping status: Never Used  Substance and Sexual Activity   Alcohol use: No    Alcohol/week: 0.0 standard drinks of alcohol   Drug use: No   Sexual activity: Yes  Other Topics Concern   Not on file  Social History Narrative   Lives with wife.  4 kids total (2 step, 2 biological). 1 that has CP. Daughter Stephan Edwards- comes to [facility]).         Retired Water quality scientist.  Oldest daughter has CP      Hobbies: gardening, lawn care, care for grandkids   Social Drivers of Health   Financial Resource Strain: Low Risk  (04/02/2023)   Overall Financial Resource Strain (CARDIA)    Difficulty of Paying Living Expenses: Not hard at all  Food Insecurity: No Food Insecurity (10/02/2023)   Hunger Vital Sign    Worried About Running Out of Food in the Last Year: Never true    Ran Out of Food in the Last Year: Never true  Transportation Needs: No Transportation Needs (10/02/2023)   PRAPARE - Administrator, Civil Service (Medical): No    Lack of Transportation (Non-Medical): No  Physical Activity: Sufficiently Active (04/02/2023)   Exercise Vital Sign    Days of Exercise per Week: 2 days    Minutes of Exercise per Session: 90 min  Stress: No Stress Concern Present (04/02/2023)   [employer] of Occupational Health - Occupational Stress Questionnaire    Feeling of Stress : Not at all  Social Connections: Moderately Integrated (10/02/2023)   Social Connection and Isolation Panel    Frequency of Communication with Friends and Family: More than three times a week    Frequency of Social Gatherings with Friends and Family: Three times a week    Attends Religious Services: More than 4 times per year    Active Member of Clubs or Organizations: No    Attends Banker Meetings: Never    Marital Status: Married   No Known Allergies Family History  Problem Relation Age of Onset   Hypertension Father    Dementia Father        Vascular. patient states dementia/possible alzheimers as well   Hyperlipidemia Sister    Cancer Brother        type unknown   Cancer Brother        type unknown   Heart disease Maternal Grandfather 2       Died probably of heart diseasse   Heart disease Maternal Uncle 17       Died of heart exploding   Cerebral palsy Daughter    Stroke Son        severe problems with blood clotting    Colon cancer Neg Hx    Esophageal cancer Neg Hx    Pancreatic cancer Neg Hx    Stomach cancer Neg Hx    Liver disease Neg Hx      Current Outpatient Medications (Cardiovascular):    atorvastatin  (LIPITOR ) 80 MG tablet, TAKE 1 TABLET DAILY   isosorbide  mononitrate (IMDUR ) 60 MG 24 hr tablet, TAKE 1 TABLET DAILY   nitroGLYCERIN  (NITROSTAT ) 0.4 MG SL tablet, DISSOLVE 1 TABLET UNDER THE TONGUE EVERY  5 MINUTES AS NEEDED FOR CHEST PAIN UP TO 3 DOSES (Patient taking differently: Place 0.4 mg under the tongue every 5 (five) minutes as needed for chest pain. /Patient took 1 tablet yesterday,  10/01/23.)   ranolazine  (RANEXA ) 500 MG 12 hr tablet, Take 1 tablet (500 mg total) by mouth 2 (two) times daily.   Current Outpatient Medications (Analgesics):    aspirin  81 MG tablet, Take 81 mg by mouth daily.  Current Outpatient Medications (Hematological):    ticagrelor  (BRILINTA ) 60 MG TABS tablet, Take 1 tablet (60 mg total) by mouth 2 (two) times daily.  Current Outpatient Medications (Other):    CHERRY PO, Take 50 mg by mouth daily. Cherry extract   Cinnamon 500 MG capsule, Take 500 mg by mouth daily.   Coenzyme Q10 (Q-SORB CO Q-10) 100 MG capsule, Take 1 capsule by mouth daily.   famotidine  (PEPCID ) 40 MG tablet, Take 1 tablet (40 mg total) by mouth daily.   Turmeric 500 MG CAPS, Take 500 mg by mouth daily.   Reviewed prior external information including notes and imaging from  primary care provider As well as notes that were available from care everywhere and other healthcare systems.  Past medical history, social, surgical and family history all reviewed in electronic medical record.  No pertanent information unless stated regarding to the chief complaint.   Review of Systems:  No headache, visual changes, nausea, vomiting, diarrhea, constipation, dizziness, abdominal pain, skin rash, fevers, chills, night sweats, weight loss, swollen lymph nodes, body aches, joint swelling, chest pain,  shortness of breath, mood changes. POSITIVE muscle aches  Objective  Blood pressure 138/74, pulse 72, height 5' 6 (1.676 m), weight 195 lb (88.5 kg), SpO2 98%.   General: No apparent distress alert and oriented x3 mood and affect normal, dressed appropriately.  HEENT: Pupils equal, extraocular movements intact  Respiratory: Patient's speak in full sentences and does not appear short of breath  Cardiovascular: No lower extremity edema, non tender, no erythema  Lower back does have some loss lordosis.  Some tenderness to palpation in the paraspinal musculature.  Tightness noted in the paraspinal lumbar area.   Osteopathic findings T11 extended rotated and side bent left L1 flexed rotated and side bent right Sacrum right on right    Impression and Recommendations:    Partial nontraumatic tear of right rotator cuff Discussed with patient at great length.  Does have some C3-C4 protruding disc that could be contributing to some of the weakness in the shoulder.  Does have the partial tearing though noted of the rotator cuff and feels like the shoulder is giving him more the discomfort at this time.  Will do the PRP for further evaluation.  Patient would not be able to have any surgery until October anyhow.  Hopefully this will be helpful.  If not we will refer to orthopedic surgery.  Patient will be scheduled for PRP in the near future.  Total time discussing with patient 31 minutes  Back pain Chronic problem that does respond well to conservative therapy including core strengthening, osteopathic manipulation, icing regimen.  Follow-up again in 6-8 weeks     Decision today to treat with OMT was based on Physical Exam  After verbal consent patient was treated with HVLA, ME, FPR techniques in  thoracic,  lumbar and sacral areas, all areas are chronic   Patient tolerated the procedure well with improvement in symptoms  Patient given exercises, stretches and lifestyle modifications  See  medications in patient instructions  if given  Patient will follow up in 4-8 weeks  The above documentation has been reviewed and is accurate and complete Bird Tailor M Graycee Greeson, DO

## 2024-03-09 ENCOUNTER — Encounter: Payer: Self-pay | Admitting: Family Medicine

## 2024-03-09 ENCOUNTER — Ambulatory Visit: Admitting: Family Medicine

## 2024-03-09 ENCOUNTER — Ambulatory Visit: Payer: Self-pay

## 2024-03-09 ENCOUNTER — Ambulatory Visit: Payer: Self-pay | Admitting: Family Medicine

## 2024-03-09 VITALS — BP 138/74 | HR 72 | Ht 66.0 in | Wt 195.0 lb

## 2024-03-09 VITALS — Ht 66.0 in

## 2024-03-09 DIAGNOSIS — M75111 Incomplete rotator cuff tear or rupture of right shoulder, not specified as traumatic: Secondary | ICD-10-CM

## 2024-03-09 DIAGNOSIS — M9902 Segmental and somatic dysfunction of thoracic region: Secondary | ICD-10-CM | POA: Diagnosis not present

## 2024-03-09 DIAGNOSIS — M9903 Segmental and somatic dysfunction of lumbar region: Secondary | ICD-10-CM

## 2024-03-09 DIAGNOSIS — M25511 Pain in right shoulder: Secondary | ICD-10-CM

## 2024-03-09 DIAGNOSIS — G8929 Other chronic pain: Secondary | ICD-10-CM

## 2024-03-09 DIAGNOSIS — M549 Dorsalgia, unspecified: Secondary | ICD-10-CM | POA: Diagnosis not present

## 2024-03-09 DIAGNOSIS — M9904 Segmental and somatic dysfunction of sacral region: Secondary | ICD-10-CM | POA: Diagnosis not present

## 2024-03-09 NOTE — Progress Notes (Signed)
  [facility] [address] [place] [address] Phone: [phone] Subjective:    I'm seeing this patient by the request  of:  Almira Jaeger, MD  CC: Shoulder rotator cuff tear  [id number]  Kodee Ravert Coran is a 68 y.o. male coming in with complaint of rotator cuff tear here for PRP  Procedure: Real-time Ultrasound Guided Injection of right glenohumeral joint Device: GE Logiq Q7  Ultrasound guided injection is preferred based studies that show increased duration, increased effect, greater accuracy, decreased procedural pain, increased response rate with ultrasound guided versus blind injection.  Verbal informed consent obtained.  Time-out conducted.  Noted no overlying erythema, induration, or other signs of local infection.  Skin prepped in a sterile fashion.  Local anesthesia: Topical Ethyl chloride.  With sterile technique and under real time ultrasound guidance:  Joint visualized.  23g 1  inch needle inserted anterior lateral approach. Pictures taken for needle placement. Patient did have injection of 2 cc of 1% lidocaine , 1 cc of Marcaine, a then once properly placed injected 5 cc of PRP that was in the supraspinatus, subscapularis and in the attachment of the bicep and anterior labrum. Completed without difficulty  Pain immediately resolved suggesting accurate placement of the medication.  Advised to call if fevers/chills, erythema, induration, drainage, or persistent bleeding.  Images saved Impression: Technically successful ultrasound guided injection.

## 2024-03-09 NOTE — Assessment & Plan Note (Signed)
 Chronic problem that does respond well to conservative therapy including core strengthening, osteopathic manipulation, icing regimen.  Follow-up again in 6-8 weeks

## 2024-03-09 NOTE — Patient Instructions (Signed)
 No ice or IBU for 3 days Heat and Tylenol are ok See me again in 6 weeks

## 2024-03-09 NOTE — Assessment & Plan Note (Signed)
 Discussed with patient at great length.  Does have some C3-C4 protruding disc that could be contributing to some of the weakness in the shoulder.  Does have the partial tearing though noted of the rotator cuff and feels like the shoulder is giving him more the discomfort at this time.  Will do the PRP for further evaluation.  Patient would not be able to have any surgery until October anyhow.  Hopefully this will be helpful.  If not we will refer to orthopedic surgery.  Patient will be scheduled for PRP in the near future.  Total time discussing with patient 31 minutes

## 2024-03-09 NOTE — Assessment & Plan Note (Signed)
 Injection given today and tolerated the procedure well, post PRP protocol able to swim in 2 weeks discussed which activities to do and which ones to avoid.

## 2024-03-19 ENCOUNTER — Encounter: Payer: Self-pay | Admitting: Family Medicine

## 2024-03-31 ENCOUNTER — Telehealth: Payer: Self-pay | Admitting: Gastroenterology

## 2024-03-31 MED ORDER — HYDROCORTISONE (PERIANAL) 2.5 % EX CREA: TOPICAL_CREAM | Freq: Two times a day (BID) | CUTANEOUS | 0 refills | Status: DC

## 2024-03-31 NOTE — Telephone Encounter (Signed)
 Inbound call from patient, requesting hydrocortisone  Anusol  cream be called into [facility] [address]. Patient scheduled follow up for 7/23 at 9:00 AM. With Deanna May.

## 2024-03-31 NOTE — Telephone Encounter (Signed)
 Prescription for anusol  cream sent to patient's pharmacy until scheduled appt with Deanna May, NP on 04/13/24.

## 2024-04-04 ENCOUNTER — Ambulatory Visit (INDEPENDENT_AMBULATORY_CARE_PROVIDER_SITE_OTHER): Payer: Medicare Other

## 2024-04-04 VITALS — BP 120/80 | HR 74 | Temp 98.7°F | Ht 66.0 in | Wt 193.8 lb

## 2024-04-04 DIAGNOSIS — Z860100 Personal history of colon polyps, unspecified: Secondary | ICD-10-CM

## 2024-04-04 DIAGNOSIS — Z0000 Encounter for general adult medical examination without abnormal findings: Secondary | ICD-10-CM | POA: Diagnosis not present

## 2024-04-04 DIAGNOSIS — Z1211 Encounter for screening for malignant neoplasm of colon: Secondary | ICD-10-CM

## 2024-04-04 NOTE — Progress Notes (Signed)
 Subjective:   [place] Espina is a 68 y.o. who presents for a Medicare Wellness preventive visit.  As a reminder, Annual Wellness Visits don't include a physical exam, and some assessments may be limited, especially if this visit is performed virtually. We may recommend an in-person follow-up visit with your provider if needed.  Visit Complete: In person    Persons Participating in Visit: Patient.  AWV Questionnaire: No: Patient Medicare AWV questionnaire was not completed prior to this visit.  Cardiac Risk Factors include: advanced age (>4men, >29 women);dyslipidemia;hypertension;male gender;obesity (BMI >30kg/m2)     Objective:    Today's Vitals   04/04/24 0827  BP: 120/80  Pulse: 74  Temp: 98.7 F (37.1 C)  SpO2: 95%  Weight: 193 lb 12.8 oz (87.9 kg)  Height: 5' 6 (1.676 m)   Body mass index is 31.28 kg/m.     04/04/2024    8:32 AM 10/02/2023    9:53 AM 10/01/2023   12:08 PM 04/02/2023    4:08 PM 01/29/2022    7:59 AM 01/15/2022    2:20 PM 09/24/2021    8:59 AM  Advanced Directives  Does Patient Have a Medical Advance Directive? Yes Yes No Yes No No Yes  Type of Estate agent of [place];Living will Healthcare Power of [place];Living will  Healthcare Power of [place];Living will   Healthcare Power of Attorney  Does patient want to make changes to medical advance directive? No - Patient declined Yes (Inpatient - patient defers changing a medical advance directive and declines information at this time)  No - Patient declined     Copy of Healthcare Power of Attorney in Chart? Yes - validated most recent copy scanned in chart (See row information) Yes - validated most recent copy scanned in chart (See row information)  Yes - validated most recent copy scanned in chart (See row information)   Yes - validated most recent copy scanned in chart (See row information)  Would patient like information on creating a medical advance directive?     No -  Patient declined No - Patient declined     Current Medications (verified) Outpatient Encounter Medications as of 04/04/2024  Medication Sig   aspirin  81 MG tablet Take 81 mg by mouth daily.   atorvastatin  (LIPITOR ) 80 MG tablet TAKE 1 TABLET DAILY   CHERRY PO Take 50 mg by mouth daily. Cherry extract   Cinnamon 500 MG capsule Take 500 mg by mouth daily.   Coenzyme Q10 (Q-SORB CO Q-10) 100 MG capsule Take 1 capsule by mouth daily.   famotidine  (PEPCID ) 40 MG tablet Take 1 tablet (40 mg total) by mouth daily.   hydrocortisone  (ANUSOL -HC) 2.5 % rectal cream Place rectally 2 (two) times daily. Patient needs office visit.   isosorbide  mononitrate (IMDUR ) 60 MG 24 hr tablet TAKE 1 TABLET DAILY   nitroGLYCERIN  (NITROSTAT ) 0.4 MG SL tablet DISSOLVE 1 TABLET UNDER THE TONGUE EVERY 5 MINUTES AS NEEDED FOR CHEST PAIN UP TO 3 DOSES   ranolazine  (RANEXA ) 500 MG 12 hr tablet Take 1 tablet (500 mg total) by mouth 2 (two) times daily.   ticagrelor  (BRILINTA ) 60 MG TABS tablet Take 1 tablet (60 mg total) by mouth 2 (two) times daily.   Turmeric 500 MG CAPS Take 500 mg by mouth daily.   No facility-administered encounter medications on file as of 04/04/2024.    Allergies (verified) Patient has no known allergies.   History: Past Medical History:  Diagnosis Date   Acute  lateral meniscal tear, right, initial encounter 09/26/2021   Injection given September 26, 2018.   CAD S/P PCI to Cx & RCA    a. s/p PCI of LCx (2006) and ostium of RCA (2007) as well as DES to RCA in [place] (05/2014)   HTN (hypertension)    Hyperlipidemia    Plantar fasciitis 10/12/2017   Past Surgical History:  Procedure Laterality Date   CARDIAC CATHETERIZATION N/A 05/06/2016   Procedure: Left Heart Cath and Coronary Angiography;  Surgeon: Lonni Hanson, MD;  Location[facility] INVASIVE CV LAB;  Service: Cardiovascular;  Laterality: N/A;   CARDIAC CATHETERIZATION N/A 05/06/2016   Procedure: Coronary Stent Intervention;  Surgeon:  Lonni Hanson, MD;  Location: [facility] INVASIVE CV LAB;  Service: Cardiovascular;  Laterality: N/A;   CARDIAC CATHETERIZATION N/A 05/13/2016   Procedure: Coronary/Graft Angiography;  Surgeon: Peter M Swaziland, MD;  Location: [facility] INVASIVE CV LAB;  Service: Cardiovascular;  Laterality: N/A;   CORONARY ANGIOPLASTY WITH STENT PLACEMENT  2006   in [place] , occluded CFX, s/p Taxus stent   CORONARY ANGIOPLASTY WITH STENT PLACEMENT  2007   in [place] , ostial RCA stent   CORONARY ANGIOPLASTY WITH STENT PLACEMENT  2015   in [place] , RCA stent   CORONARY PRESSURE/FFR STUDY N/A 10/02/2023   Procedure: CORONARY PRESSURE/FFR STUDY;  Surgeon: Wendel Lurena POUR, MD;  Location: [facility] INVASIVE CV LAB;  Service: Cardiovascular;  Laterality: N/A;   LEFT HEART CATH AND CORONARY ANGIOGRAPHY N/A 10/02/2023   Procedure: LEFT HEART CATH AND CORONARY ANGIOGRAPHY;  Surgeon: Wendel Lurena POUR, MD;  Location: [facility] INVASIVE CV LAB;  Service: Cardiovascular;  Laterality: N/A;   TONSILLECTOMY AND ADENOIDECTOMY      Family History  Problem Relation Age of Onset   Hypertension Father    Dementia Father        Vascular. patient states dementia/possible alzheimers as well   Hyperlipidemia Sister    Cancer Brother        type unknown   Cancer Brother        type unknown   Heart disease Maternal Grandfather 59       Died probably of heart diseasse   Heart disease Maternal Uncle 63       Died of heart exploding   Cerebral palsy Daughter    Stroke Son        severe problems with blood clotting   Colon cancer Neg Hx    Esophageal cancer Neg Hx    Pancreatic cancer Neg Hx    Stomach cancer Neg Hx    Liver disease Neg Hx    Social History   Socioeconomic History   Marital status: Married    Spouse name: Not on file   Number of children: 4   Years of education: Not on file   Highest education level: Not on file  Occupational History   Occupation: retired  Tobacco Use   Smoking status: Never   Smokeless  tobacco: Never  Vaping Use   Vaping status: Never Used  Substance and Sexual Activity   Alcohol use: No    Alcohol/week: 0.0 standard drinks of alcohol   Drug use: No   Sexual activity: Yes  Other Topics Concern   Not on file  Social History Narrative   Lives with wife.  4 kids total (2 step, 2 biological). 1 that has CP. Daughter Elvie Hsu- comes to [facility]).        Retired Water quality scientist.  Oldest daughter has CP  Hobbies: gardening, lawn care, care for grandkids   Social Drivers of Health   Financial Resource Strain: Low Risk  (04/04/2024)   Overall Financial Resource Strain (CARDIA)    Difficulty of Paying Living Expenses: Not hard at all  Food Insecurity: No Food Insecurity (04/04/2024)   Hunger Vital Sign    Worried About Running Out of Food in the Last Year: Never true    Ran Out of Food in the Last Year: Never true  Transportation Needs: No Transportation Needs (04/04/2024)   PRAPARE - Administrator, Civil Service (Medical): No    Lack of Transportation (Non-Medical): No  Physical Activity: Sufficiently Active (04/04/2024)   Exercise Vital Sign    Days of Exercise per Week: 2 days    Minutes of Exercise per Session: 90 min  Stress: No Stress Concern Present (04/04/2024)   [employer] of Occupational Health - Occupational Stress Questionnaire    Feeling of Stress: Not at all  Social Connections: Moderately Integrated (04/04/2024)   Social Connection and Isolation Panel    Frequency of Communication with Friends and Family: More than three times a week    Frequency of Social Gatherings with Friends and Family: More than three times a week    Attends Religious Services: More than 4 times per year    Active Member of [employer] or Organizations: No    Attends Banker Meetings: Never    Marital Status: Married    Tobacco Counseling Counseling given: Not Answered    Clinical Intake:  Pre-visit preparation completed: Yes  Pain :  No/denies pain     BMI - recorded: 31.28 Nutritional Status: BMI > 30  Obese Nutritional Risks: None Diabetes: No  Lab Results  Component Value Date   HGBA1C 6.2 (H) 10/01/2023   HGBA1C 6.0 03/10/2023   HGBA1C 5.9 02/06/2022     How often do you need to have someone help you when you read instructions, pamphlets, or other written materials from your doctor or pharmacy?: 1 - Never  Interpreter Needed?: No  Information entered by :: Ellouise Haws, LPN   Activities of Daily Living     04/04/2024    8:28 AM 10/02/2023    9:53 AM  In your present state of health, do you have any difficulty performing the following activities:  Hearing? 0 0  Vision? 0 0  Difficulty concentrating or making decisions? 0 0  Walking or climbing stairs? 0   Dressing or bathing? 0   Doing errands, shopping? 0 0  Preparing Food and eating ? N   Using the Toilet? N   In the past six months, have you accidently leaked urine? N   Do you have problems with loss of bowel control? N   Managing your Medications? N   Managing your Finances? N   Housekeeping or managing your Housekeeping? N     Patient Care Team: [name], MD as PCP - General (Family Medicine) Lavona Agent, MD as PCP - Cardiology (Cardiology) Dow Maxwell, PT as Physical Therapist (Physical Therapy) Livingston Rigg, MD (Inactive) as Consulting Physician (Dermatology)  I have updated your Care Teams any recent Medical Services you may have received from other providers in the past year.     Assessment:   This is a routine wellness examination for [place].  Hearing/Vision screen Hearing Screening - Comments:: Pt denies any hearing issues  Vision Screening - Comments:: Wears rx glasses - up to date with routine eye exams with [facility] Dr Raelyn     Goals Addressed             This Visit's Progress    Patient Stated       Lose 5 lbs to 10 lbs        Depression Screen     04/04/2024    8:32 AM 04/23/2023     2:11 PM 04/02/2023    4:07 PM 09/24/2021    8:58 AM 02/05/2021    8:00 AM 09/14/2019    8:12 AM 03/15/2019    9:20 AM  PHQ 2/9 Scores  PHQ - 2 Score 0 0 0 0 0 0 0  PHQ- 9 Score  1   0      Fall Risk     04/04/2024    8:33 AM 04/23/2023    2:10 PM 04/02/2023    4:09 PM 09/24/2021    8:59 AM 02/05/2021    8:00 AM  Fall Risk   Falls in the past year? 0 0 0 0 0  Number falls in past yr: 0 0 0 0 0  Injury with Fall? 0 0 0 0 0  Risk for fall due to : No Fall Risks No Fall Risks Impaired vision Impaired vision   Follow up Falls prevention discussed Falls evaluation completed Falls prevention discussed Falls prevention discussed       Data saved with a previous flowsheet row definition    MEDICARE RISK AT HOME:  Medicare Risk at Home Any stairs in or around the home?: Yes If so, are there any without handrails?: No Home free of loose throw rugs in walkways, pet beds, electrical cords, etc?: Yes Adequate lighting in your home to reduce risk of falls?: Yes Life alert?: No Use of a cane, walker or w/c?: No Grab bars in the bathroom?: Yes Shower chair or bench in shower?: No Elevated toilet seat or a handicapped toilet?: No  TIMED UP AND GO:  Was the test performed?  Yes  Length of time to ambulate 10 feet: 10 sec Gait steady and fast without use of assistive device  Cognitive Function: 6CIT completed        04/04/2024    8:34 AM 04/02/2023    4:09 PM 09/24/2021    8:53 AM  6CIT Screen  What Year? 0 points 0 points 0 points  What month? 0 points 0 points 0 points  What time? 0 points 0 points 0 points  Count back from 20 0 points 0 points 0 points  Months in reverse 0 points 0 points 0 points  Repeat phrase 0 points 0 points 2 points  Total Score 0 points 0 points 2 points    Immunizations Immunization History  Administered Date(s) Administered   Fluad Quad(high Dose 65+) 08/03/2020, 08/07/2021   Influenza,inj,Quad PF,6+ Mos 06/07/2018, 09/14/2019   Influenza-Unspecified  06/26/2014   Pneumococcal Polysaccharide-23 08/03/2020   Td 12/08/2017    Screening Tests Health Maintenance  Topic Date Due   COVID-19 Vaccine (1) Never done   Zoster Vaccines- Shingrix (1 of 2) Never done   Colonoscopy  01/21/2023   Pneumococcal Vaccine: 50+ Years (2 of 2 - PCV) 10/25/2024 (Originally 08/03/2021)   INFLUENZA VACCINE  04/22/2024   Medicare Annual Wellness (AWV)  04/04/2025   DTaP/Tdap/Td (2 - Tdap) 12/09/2027   Hepatitis C Screening  Completed   Hepatitis B Vaccines  Aged Out   HPV VACCINES  Aged Out   Meningococcal B Vaccine  Aged Out  Health Maintenance  Health Maintenance Due  Topic Date Due   COVID-19 Vaccine (1) Never done   Zoster Vaccines- Shingrix (1 of 2) Never done   Colonoscopy  01/21/2023   Health Maintenance Items Addressed: See Nurse Notes at the end of this note  Additional Screening:  Vision Screening: Recommended annual ophthalmology exams for early detection of glaucoma and other disorders of the eye. Would you like a referral to an eye doctor? No    Dental Screening: Recommended annual dental exams for proper oral hygiene  Community Resource Referral / Chronic Care Management: CRR required this visit?  No   CCM required this visit?  No   Plan:    I have personally reviewed and noted the following in the patient's chart:   Medical and social history Use of alcohol, tobacco or illicit drugs  Current medications and supplements including opioid prescriptions. Patient is not currently taking opioid prescriptions. Functional ability and status Nutritional status Physical activity Advanced directives List of other physicians Hospitalizations, surgeries, and ER visits in previous 12 months Vitals Screenings to include cognitive, depression, and falls Referrals and appointments  In addition, I have reviewed and discussed with patient certain preventive protocols, quality metrics, and best practice recommendations. A written  personalized care plan for preventive services as well as general preventive health recommendations were provided to patient.   Ellouise VEAR Haws, LPN   [id number]/85/[id number]   After Visit Summary: (In Person-Declined) Patient declined AVS at this time.  Notes: Nothing significant to report at this time.

## 2024-04-04 NOTE — Addendum Note (Signed)
 Addended by: JAYLENE ELLOUISE DEL on: 04/04/2024 09:36 AM   Modules accepted: Orders

## 2024-04-04 NOTE — Patient Instructions (Signed)
 Michael [name] , Thank you for taking time out of your busy schedule to complete your Annual Wellness Visit with me. I enjoyed our conversation and look forward to speaking with you again next year. I, as well as your care team,  appreciate your ongoing commitment to your health goals. Please review the following plan we discussed and let me know if I can assist you in the future. Your Game plan/ To Do List    Referrals: If you haven't heard from the office you've been referred to, please reach out to them at the phone provided.   Follow up Visits: Next Medicare AWV with our clinical staff: 04/10/25   Have you seen your provider in the last 6 months (3 months if uncontrolled diabetes)? Yes Next Office Visit with your provider: 04/27/24  Clinician Recommendations:  Aim for 30 minutes of exercise or brisk walking, 6-8 glasses of water, and 5 servings of fruits and vegetables each day.        This is a list of the screening recommended for you and due dates:  Health Maintenance  Topic Date Due   COVID-19 Vaccine (1) Never done   Zoster (Shingles) Vaccine (1 of 2) Never done   Colon Cancer Screening  01/21/2023   Medicare Annual Wellness Visit  04/01/2024   Pneumococcal Vaccine for age over 91 (2 of 2 - PCV) 10/25/2024*   Flu Shot  04/22/2024   DTaP/Tdap/Td vaccine (2 - Tdap) 12/09/2027   Hepatitis C Screening  Completed   Hepatitis B Vaccine  Aged Out   HPV Vaccine  Aged Out   Meningitis B Vaccine  Aged Out  *Topic was postponed. The date shown is not the original due date.    Advanced directives: (In Chart) A copy of your advanced directives are scanned into your chart should your provider ever need it. Advance Care Planning is important because it:  [x]  Makes sure you receive the medical care that is consistent with your values, goals, and preferences  [x]  It provides guidance to your family and loved ones and reduces their decisional burden about whether or not they are making the  right decisions based on your wishes.  Follow the link provided in your after visit summary or read over the paperwork we have mailed to you to help you started getting your Advance Directives in place. If you need assistance in completing these, please reach out to us  so that we can help you!  See attachments for Preventive Care and Fall Prevention Tips.

## 2024-04-13 ENCOUNTER — Encounter: Payer: Self-pay | Admitting: Gastroenterology

## 2024-04-13 ENCOUNTER — Ambulatory Visit (INDEPENDENT_AMBULATORY_CARE_PROVIDER_SITE_OTHER): Admitting: Gastroenterology

## 2024-04-13 ENCOUNTER — Encounter: Payer: Self-pay | Admitting: Family Medicine

## 2024-04-13 VITALS — BP 130/68 | HR 70 | Ht 66.0 in | Wt 192.0 lb

## 2024-04-13 DIAGNOSIS — K219 Gastro-esophageal reflux disease without esophagitis: Secondary | ICD-10-CM | POA: Diagnosis not present

## 2024-04-13 DIAGNOSIS — K644 Residual hemorrhoidal skin tags: Secondary | ICD-10-CM | POA: Diagnosis not present

## 2024-04-13 DIAGNOSIS — K648 Other hemorrhoids: Secondary | ICD-10-CM

## 2024-04-13 DIAGNOSIS — Z860101 Personal history of adenomatous and serrated colon polyps: Secondary | ICD-10-CM

## 2024-04-13 DIAGNOSIS — L290 Pruritus ani: Secondary | ICD-10-CM | POA: Diagnosis not present

## 2024-04-13 DIAGNOSIS — K5900 Constipation, unspecified: Secondary | ICD-10-CM

## 2024-04-13 DIAGNOSIS — R194 Change in bowel habit: Secondary | ICD-10-CM

## 2024-04-13 MED ORDER — CLOTRIMAZOLE-BETAMETHASONE 1-0.05 % EX CREA: 1.0000 | TOPICAL_CREAM | Freq: Two times a day (BID) | CUTANEOUS | 2 refills | Status: DC

## 2024-04-13 NOTE — Patient Instructions (Addendum)
 Rectal itching Sending topical ointment Lotrisone , apply twice daily as needed for rectal itching. Stop Anusol , do not use both. Keep rectal area clean and dry. Only clean area with water and dab dry. Use wet wipes to clean after bowel movements. The external skin tags would need to be surgically removed.   GERD Use Pepcid  as needed GERD diet, no late meal  Constipation Recommend High fiber diet No straining Purchase squatty potty may help with bowel movements  _______________________________________________________  If your blood pressure at your visit was 140/90 or greater, please contact your primary care physician to follow up on this.  _______________________________________________________  If you are age 64 or older, your body mass index should be between 23-30. Your Body mass index is 30.99 kg/m. If this is out of the aforementioned range listed, please consider follow up with your Primary Care Provider.  If you are age 72 or younger, your body mass index should be between 19-25. Your Body mass index is 30.99 kg/m. If this is out of the aformentioned range listed, please consider follow up with your Primary Care Provider.   ________________________________________________________  The [facility] providers would like to encourage you to use MYCHART to communicate with providers for non-urgent requests or questions.  Due to long hold times on the telephone, sending your provider a message by [facility] may be a faster and more efficient way to get a response.  Please allow 48 business hours for a response.  Please remember that this is for non-urgent requests.  _______________________________________________________  [facility] is using a team-based approach to care.  Your team is made up of your doctor and two to three APPS. Our APPS (Nurse Practitioners and Physician Assistants) work with your physician to ensure care continuity for you. They are fully qualified  to address your health concerns and develop a treatment plan. They communicate directly with your gastroenterologist to care for you. Seeing the Advanced Practice Practitioners on your physician's team can help you by facilitating care more promptly, often allowing for earlier appointments, access to diagnostic testing, procedures, and other specialty referrals.   Thank you for trusting me with your gastrointestinal care. Deanna May, NP-C

## 2024-04-13 NOTE — Progress Notes (Addendum)
 Chief Complaint:colon screening and medication refills Primary GI Doctor: (previously Dr. Aneita) Dr. Suzann  HPI:  Patient is a  68  year old male patient with past medical history of CAD, hypertension, hyperlipidemia, who was referred to me by Kennyth Bart MD on 04/04/24 for a evaluation of colon screening and discuss medication refills .   Last seen in GI office by Dr. Aneita on 07/15/22 for GERD and persistent anal itching.  Interval History    Patient presents for follow-up and to request medication refill. Patient has history of GERD that is managed with dietary modifications and famotidine  40 mg prn. Patient denies dysphagia.       Patient also has longstanding history of anal itching and uses Anusol  topical prn. He reports he was told in past he had  hemorrhoids and offered alternative therapies that would involve surgery, which he does not wish to pursue.  Patient reports he has had rectal itching on the outside of his rectum as well as inside his rectum.  Denies blood in stool.  Patient does report he has occasional constipation.  He admits he does not drink as much water as he should.     New medical history includes Jan 2025 patient had cardiac cath due to patient having chest discomfort.  Results as follow: widely patent stents in proximal to mid LAD, mid left circumflex, and distal right coronary artery with mild nonobstructive disease elsewhere. Given evidence of coronary microvascular dysfunction there are adjustments and titration of his medications ARB's, calcium  channel blockers, and beta-blockers.   Patient reports he is no longer having chest discomfort.  Patient taking baby ASA 81 mg po daily.  Wt Readings from Last 3 Encounters:  04/13/24 192 lb (87.1 kg)  04/04/24 193 lb 12.8 oz (87.9 kg)  03/09/24 195 lb (88.5 kg)     Past Medical History:  Diagnosis Date   Acute lateral meniscal tear, right, initial encounter 09/26/2021   Injection given September 26, 2018.   CAD  S/P PCI to Cx & RCA    a. s/p PCI of LCx (2006) and ostium of RCA (2007) as well as DES to RCA in [place] (05/2014)   HTN (hypertension)    Hyperlipidemia    Plantar fasciitis 10/12/2017    Past Surgical History:  Procedure Laterality Date   CARDIAC CATHETERIZATION N/A 05/06/2016   Procedure: Left Heart Cath and Coronary Angiography;  Surgeon: Lonni Hanson, MD;  [facility]: [facility] INVASIVE CV LAB;  Service: Cardiovascular;  Laterality: N/A;   CARDIAC CATHETERIZATION N/A 05/06/2016   Procedure: Coronary Stent Intervention;  Surgeon: Lonni Hanson, MD;  Location: [facility] INVASIVE CV LAB;  Service: Cardiovascular;  Laterality: N/A;   CARDIAC CATHETERIZATION N/A 05/13/2016   Procedure: Coronary/Graft Angiography;  Surgeon: Peter M Swaziland, MD;  Location: [facility] INVASIVE CV LAB;  Service: Cardiovascular;  Laterality: N/A;   CORONARY ANGIOPLASTY WITH STENT PLACEMENT  2006   in [place] , occluded CFX, s/p Taxus stent   CORONARY ANGIOPLASTY WITH STENT PLACEMENT  2007   in [place] , ostial RCA stent   CORONARY ANGIOPLASTY WITH STENT PLACEMENT  2015   in [place] , RCA stent   CORONARY PRESSURE/FFR STUDY N/A 10/02/2023   Procedure: CORONARY PRESSURE/FFR STUDY;  Surgeon: Wendel Lurena POUR, MD;  Location: [facility] INVASIVE CV LAB;  Service: Cardiovascular;  Laterality: N/A;   LEFT HEART CATH AND CORONARY ANGIOGRAPHY N/A 10/02/2023   Procedure: LEFT HEART CATH AND CORONARY ANGIOGRAPHY;  Surgeon: Wendel Lurena POUR, MD;  Location: [facility] INVASIVE CV LAB;  Service: Cardiovascular;  Laterality: N/A;   TONSILLECTOMY AND ADENOIDECTOMY       Current Outpatient Medications  Medication Sig Dispense Refill   aspirin  81 MG tablet Take 81 mg by mouth daily.     atorvastatin  (LIPITOR ) 80 MG tablet TAKE 1 TABLET DAILY 90 tablet 3   CHERRY PO Take 50 mg by mouth daily. Cherry extract     Cinnamon 500 MG capsule Take 500 mg by mouth daily.     clotrimazole -betamethasone  (LOTRISONE ) cream Apply 1 Application topically 2 (two)  times daily. 15 g 2   Coenzyme Q10 (Q-SORB CO Q-10) 100 MG capsule Take 1 capsule by mouth daily.     famotidine  (PEPCID ) 40 MG tablet Take 1 tablet (40 mg total) by mouth daily. 90 tablet 3   hydrocortisone  (ANUSOL -HC) 2.5 % rectal cream Place rectally 2 (two) times daily. Patient needs office visit. 30 g 0   isosorbide  mononitrate (IMDUR ) 60 MG 24 hr tablet TAKE 1 TABLET DAILY 90 tablet 3   nitroGLYCERIN  (NITROSTAT ) 0.4 MG SL tablet DISSOLVE 1 TABLET UNDER THE TONGUE EVERY 5 MINUTES AS NEEDED FOR CHEST PAIN UP TO 3 DOSES 25 tablet 1   ranolazine  (RANEXA ) 500 MG 12 hr tablet Take 1 tablet (500 mg total) by mouth 2 (two) times daily. 60 tablet 5   ticagrelor  (BRILINTA ) 60 MG TABS tablet Take 1 tablet (60 mg total) by mouth 2 (two) times daily. 180 tablet 3   Turmeric 500 MG CAPS Take 500 mg by mouth daily.     No current facility-administered medications for this visit.    Allergies as of 04/13/2024   (No Known Allergies)    Family History  Problem Relation Age of Onset   Hypertension Father    Dementia Father        Vascular. patient states dementia/possible alzheimers as well   Hyperlipidemia Sister    Cancer Brother        type unknown   Cancer Brother        type unknown   Heart disease Maternal Grandfather 57       Died probably of heart diseasse   Heart disease Maternal Uncle 107       Died of heart exploding   Cerebral palsy Daughter    Stroke Son        severe problems with blood clotting   Colon cancer Neg Hx    Esophageal cancer Neg Hx    Pancreatic cancer Neg Hx    Stomach cancer Neg Hx    Liver disease Neg Hx     Review of Systems:    Constitutional: No weight loss, fever, chills, weakness or fatigue HEENT: Eyes: No change in vision               Ears, Nose, Throat:  No change in hearing or congestion Skin: No rash or itching Cardiovascular: No chest pain, chest pressure or palpitations   Respiratory: No SOB or cough Gastrointestinal: See HPI and otherwise  negative Genitourinary: No dysuria or change in urinary frequency Neurological: No headache, dizziness or syncope Musculoskeletal: No new muscle or joint pain Hematologic: No bleeding or bruising Psychiatric: No history of depression or anxiety    Physical Exam:  Vital signs: BP 130/68   Pulse 70   Ht 5' 6 (1.676 m)   Wt 192 lb (87.1 kg)   BMI 30.99 kg/m   Constitutional:   Pleasant  male appears to be in NAD, Well developed, Well nourished, alert and  cooperative Throat: Oral cavity and pharynx without inflammation, swelling or lesion.  Respiratory: Respirations even and unlabored. Lungs clear to auscultation bilaterally.   No wheezes, crackles, or rhonchi.  Cardiovascular: Normal S1, S2. Regular rate and rhythm. No peripheral edema, cyanosis or pallor.  Gastrointestinal:  Soft, nondistended, nontender. No rebound or guarding. Normal bowel sounds. No appreciable masses or hepatomegaly. Rectal: external rectal exam with skin tags noted, normal rectal tone, appreciated internal hemorrhoids, non-tender, no masses, , brown stool, hemoccult N/A  Anoscopy: Normal skin tags noted, small internal hemorrhoid noted, irritation noted to anal canal Msk:  Symmetrical without gross deformities. Without edema, no deformity or joint abnormality.  Neurologic:  Alert and  oriented x4;  grossly normal neurologically.  Skin:   Dry and intact without significant lesions or rashes.  RELEVANT LABS AND IMAGING: CBC    Latest Ref Rng & Units 10/02/2023    4:42 AM 10/01/2023    6:48 PM 10/01/2023   12:31 PM  CBC  WBC 4.0 - 10.5 K/uL 6.8  7.6  7.0   Hemoglobin 13.0 - 17.0 g/dL 85.1  85.3  85.3   Hematocrit 39.0 - 52.0 % 41.8  41.6  42.1   Platelets 150 - 400 K/uL 238  216  230      CMP     Latest Ref Rng & Units 10/02/2023    4:42 AM 10/01/2023    6:48 PM 10/01/2023   12:31 PM  CMP  Glucose 70 - 99 mg/dL 899   884   BUN 8 - 23 mg/dL 13   19   Creatinine 9.38 - 1.24 mg/dL 9.01  9.07  9.10   Sodium 135  - 145 mmol/L 142   139   Potassium 3.5 - 5.1 mmol/L 3.8   3.9   Chloride 98 - 111 mmol/L 105   103   CO2 22 - 32 mmol/L 27   24   Calcium  8.9 - 10.3 mg/dL 9.4   8.9      Lab Results  Component Value Date   TSH 2.14 08/03/2020  09/2023 echo- Left ventricular ejection fraction, by estimation, is 45 to 50%.   GI Procedures: 01/2018 colonoscopy with Dr. Aneita, recall 7 years - One 4 mm polyp in the ascending colon, removed with a cold biopsy forceps. Resected and retrieved.  - Two 7 mm polyps in the transverse colon, removed with a cold snare. Resected and retrieved.  - The examination was otherwise normal on direct and retroflexion views. Path:  Diagnosis Surgical [P], transverse x 2 and ascending x 1, polyp (3) - TUBULAR ADENOMA (1 OF 3 FRAGMENTS) - BENIGN COLONIC MUCOSA (2 OF 3 FRAGMENTS) - NO HIGH GRADE DYSPLASIA OR MALIGNANCY IDENTIFIED  Assessment: GERD-patient's GERD has been managed with Pepcid  as needed and dietary changes.  Reinforced GERD diet.  If his symptoms increase in severity or frequency recommended he follow-up in the office. Patient has had chronic anal itching and has used Anusol  as needed.  This seems to only provide temporary relief.  Upon examination patient did have skin tags which we discussed would have to be surgically removed.  Small internal hemorrhoid as well as irritation noted to rectal canal.  Will try a antifungal/steroid cream to see if this provides him with more relief.  Also spent several minutes discussing body hygiene and keeping the area clean and dry. Patient reports intermittent mild constipation, reinforced high-fiber diet and drinking plenty of fluids.  Also Ree Alcalde benefit from purchasing squatty potty.  No  straining during bowel movements. Personal hx of adenomatous colon polyps, colonoscopy recall at 6 years, Zakariyah Freimark 2026  Plan: - New prescription Lotrisone , apply twice daily as needed for rectal itching. -Discussed body hygiene, keep area clean and  dry -Recommend high fiber diet -Recommend GERD diet, no late meals 3-4 hours before lying down -Continue Pepcid  prn heartburn -Surveillance colonoscopy recommended at 7 years, Willys Salvino 2026  Thank you for the courtesy of this consult. Please call me with any questions or concerns.   Hammad Finkler, FNP-C [facility] 04/13/2024, 9:30 AM  Cc: [name], MD  I have reviewed the clinic note as outlined by Cathryne Beal, NP and agree with the assessment, plan and medical decision making.  Mr. Wojtkiewicz presents to the office for follow-up of GERD and hemorrhoids.  Symptoms are currently stable on Pepcid  and with the use of Anusol  as needed.  Agree with conservative measures.  Recall colonoscopy due Jalah Warmuth 2026.  Inocente Hausen, MD

## 2024-04-14 NOTE — Telephone Encounter (Signed)
 See below

## 2024-04-19 NOTE — Progress Notes (Deleted)
 [name] [address] [place] [address] Phone: [phone] Subjective:    I'm seeing this patient by the request  of:  Michael Garnette KIDD, MD  CC: right shoulder pain f/u   YEP:Dlagzrupcz  03/09/2024 Injection given today and tolerated the procedure well, post PRP protocol able to swim in 2 weeks discussed which activities to do and which ones to avoid.      Update 04/21/2024 Michael [name] is a 68 y.o. male coming in with complaint of R shoulder pain. Patient states        Past Medical History:  Diagnosis Date   Acute lateral meniscal tear, right, initial encounter 09/26/2021   Injection given September 26, 2018.   CAD S/P PCI to Cx & RCA    a. s/p PCI of LCx (2006) and ostium of RCA (2007) as well as DES to RCA in [place] (05/2014)   HTN (hypertension)    Hyperlipidemia    Plantar fasciitis 10/12/2017   Past Surgical History:  Procedure Laterality Date   CARDIAC CATHETERIZATION N/A 05/06/2016   Procedure: Left Heart Cath and Coronary Angiography;  Surgeon: Lonni Hanson, MD;  Location: [facility] INVASIVE CV LAB;  Service: Cardiovascular;  Laterality: N/A;   CARDIAC CATHETERIZATION N/A 05/06/2016   Procedure: Coronary Stent Intervention;  Surgeon: Lonni Hanson, MD;  Location: [facility] INVASIVE CV LAB;  Service: Cardiovascular;  Laterality: N/A;   CARDIAC CATHETERIZATION N/A 05/13/2016   Procedure: Coronary/Graft Angiography;  Surgeon: Peter M Swaziland, MD;  Location: [facility] INVASIVE CV LAB;  Service: Cardiovascular;  Laterality: N/A;   CORONARY ANGIOPLASTY WITH STENT PLACEMENT  2006   in [place] , occluded CFX, s/p Taxus stent   CORONARY ANGIOPLASTY WITH STENT PLACEMENT  2007   in [place] , ostial RCA stent   CORONARY ANGIOPLASTY WITH STENT PLACEMENT  2015   in [place] , RCA stent   CORONARY PRESSURE/FFR STUDY N/A 10/02/2023   Procedure: CORONARY PRESSURE/FFR STUDY;  Surgeon: Wendel Lurena POUR, MD;  Location: [facility] INVASIVE CV LAB;   Service: Cardiovascular;  Laterality: N/A;   LEFT HEART CATH AND CORONARY ANGIOGRAPHY N/A 10/02/2023   Procedure: LEFT HEART CATH AND CORONARY ANGIOGRAPHY;  Surgeon: Wendel Lurena POUR, MD;  Location: [facility] INVASIVE CV LAB;  Service: Cardiovascular;  Laterality: N/A;   TONSILLECTOMY AND ADENOIDECTOMY      Social History   Socioeconomic History   Marital status: Married    Spouse name: Not on file   Number of children: 4   Years of education: Not on file   Highest education level: Not on file  Occupational History   Occupation: retired  Tobacco Use   Smoking status: Never   Smokeless tobacco: Never  Vaping Use   Vaping status: Never Used  Substance and Sexual Activity   Alcohol use: No    Alcohol/week: 0.0 standard drinks of alcohol   Drug use: No   Sexual activity: Yes  Other Topics Concern   Not on file  Social History Narrative   Lives with wife.  4 kids total (2 step, 2 biological). 1 that has CP. Daughter Michael [name]- comes to [facility]).        Retired Water quality scientist.  Oldest daughter has CP      Hobbies: gardening, lawn care, care for grandkids   Social Drivers of Health   Financial Resource Strain: Low Risk  (04/04/2024)   Overall Financial Resource Strain (CARDIA)    Difficulty of Paying Living Expenses: Not hard at all  Food Insecurity:  No Food Insecurity (04/21/2024)   Hunger Vital Sign    Worried About Running Out of Food in the Last Year: Never true    Ran Out of Food in the Last Year: Never true  Transportation Needs: No Transportation Needs (04/21/2024)   PRAPARE - Administrator, Civil Service (Medical): No    Lack of Transportation (Non-Medical): No  Physical Activity: Sufficiently Active (04/04/2024)   Exercise Vital Sign    Days of Exercise per Week: 2 days    Minutes of Exercise per Session: 90 min  Stress: No Stress Concern Present (04/04/2024)   [employer] of Occupational Health - Occupational Stress Questionnaire    Feeling of  Stress: Not at all  Social Connections: Moderately Integrated (04/21/2024)   Social Connection and Isolation Panel    Frequency of Communication with Friends and Family: More than three times a week    Frequency of Social Gatherings with Friends and Family: More than three times a week    Attends Religious Services: More than 4 times per year    Active Member of [employer] or Organizations: No    Attends Banker Meetings: Never    Marital Status: Married   No Known Allergies Family History  Problem Relation Age of Onset   Hypertension Father    Dementia Father        Vascular. patient states dementia/possible alzheimers as well   Hyperlipidemia Sister    Cancer Brother        type unknown   Cancer Brother        type unknown   Heart disease Maternal Grandfather 18       Died probably of heart diseasse   Heart disease Maternal Uncle 73       Died of heart exploding   Cerebral palsy Daughter    Stroke Son        severe problems with blood clotting   Colon cancer Neg Hx    Esophageal cancer Neg Hx    Pancreatic cancer Neg Hx    Stomach cancer Neg Hx    Liver disease Neg Hx             Facility-Administered Medications Ordered in Other Visits (Analgesics):    acetaminophen  (TYLENOL ) tablet 650 mg **OR** acetaminophen  (TYLENOL ) 160 MG/5ML solution 650 mg **OR** acetaminophen  (TYLENOL ) suppository 650 mg    Facility-Administered Medications Ordered in Other Visits (Hematological):    enoxaparin  (LOVENOX ) injection 40 mg    Facility-Administered Medications Ordered in Other Visits (Other):    [START ON 04/22/2024]  stroke: early stages of recovery book No current facility-administered medications for this visit. No current outpatient medications on file.   Reviewed prior external information including notes and imaging from  primary care provider As well as notes that were available from care everywhere and other healthcare systems.  Past medical  history, social, surgical and family history all reviewed in electronic medical record.  No pertanent information unless stated regarding to the chief complaint.   Review of Systems:  No headache, visual changes, nausea, vomiting, diarrhea, constipation, dizziness, abdominal pain, skin rash, fevers, chills, night sweats, weight loss, swollen lymph nodes, body aches, joint swelling, chest pain, shortness of breath, mood changes. POSITIVE muscle aches  Objective  There were no vitals taken for this visit.   General: No apparent distress alert and oriented x3 mood and affect normal, dressed appropriately.  HEENT: Pupils equal, extraocular movements intact  Respiratory: Patient's speak in full sentences and  does not appear short of breath  Cardiovascular: No lower extremity edema, non tender, no erythema  Right shoulder exam shows     Impression and Recommendations:    The above documentation has been reviewed and is accurate and complete Murvin Gift M Kieanna Rollo, DO

## 2024-04-20 ENCOUNTER — Emergency Department (HOSPITAL_BASED_OUTPATIENT_CLINIC_OR_DEPARTMENT_OTHER)

## 2024-04-20 ENCOUNTER — Other Ambulatory Visit: Payer: Self-pay

## 2024-04-20 ENCOUNTER — Observation Stay (HOSPITAL_BASED_OUTPATIENT_CLINIC_OR_DEPARTMENT_OTHER)
Admission: EM | Admit: 2024-04-20 | Discharge: 2024-04-22 | Disposition: A | Discharge status: Home / Self Care | Attending: Family Medicine | Admitting: Family Medicine

## 2024-04-20 ENCOUNTER — Encounter (HOSPITAL_BASED_OUTPATIENT_CLINIC_OR_DEPARTMENT_OTHER): Payer: Self-pay

## 2024-04-20 DIAGNOSIS — Z7982 Long term (current) use of aspirin: Secondary | ICD-10-CM | POA: Diagnosis not present

## 2024-04-20 DIAGNOSIS — I5020 Unspecified systolic (congestive) heart failure: Secondary | ICD-10-CM | POA: Insufficient documentation

## 2024-04-20 DIAGNOSIS — E785 Hyperlipidemia, unspecified: Secondary | ICD-10-CM | POA: Diagnosis not present

## 2024-04-20 DIAGNOSIS — K219 Gastro-esophageal reflux disease without esophagitis: Secondary | ICD-10-CM | POA: Diagnosis not present

## 2024-04-20 DIAGNOSIS — I110 Hypertensive heart disease with heart failure: Secondary | ICD-10-CM | POA: Insufficient documentation

## 2024-04-20 DIAGNOSIS — M75120 Complete rotator cuff tear or rupture of unspecified shoulder, not specified as traumatic: Secondary | ICD-10-CM | POA: Diagnosis not present

## 2024-04-20 DIAGNOSIS — H53122 Transient visual loss, left eye: Secondary | ICD-10-CM | POA: Diagnosis present

## 2024-04-20 DIAGNOSIS — I2510 Atherosclerotic heart disease of native coronary artery without angina pectoris: Secondary | ICD-10-CM | POA: Diagnosis not present

## 2024-04-20 DIAGNOSIS — M542 Cervicalgia: Secondary | ICD-10-CM | POA: Insufficient documentation

## 2024-04-20 DIAGNOSIS — Z79899 Other long term (current) drug therapy: Secondary | ICD-10-CM | POA: Diagnosis not present

## 2024-04-20 DIAGNOSIS — R7302 Impaired glucose tolerance (oral): Secondary | ICD-10-CM | POA: Diagnosis not present

## 2024-04-20 DIAGNOSIS — G453 Amaurosis fugax: Principal | ICD-10-CM

## 2024-04-20 DIAGNOSIS — G459 Transient cerebral ischemic attack, unspecified: Secondary | ICD-10-CM | POA: Diagnosis present

## 2024-04-20 DIAGNOSIS — I255 Ischemic cardiomyopathy: Secondary | ICD-10-CM | POA: Insufficient documentation

## 2024-04-20 DIAGNOSIS — I10 Essential (primary) hypertension: Secondary | ICD-10-CM | POA: Diagnosis present

## 2024-04-20 DIAGNOSIS — Z955 Presence of coronary angioplasty implant and graft: Secondary | ICD-10-CM | POA: Diagnosis not present

## 2024-04-20 DIAGNOSIS — I639 Cerebral infarction, unspecified: Secondary | ICD-10-CM

## 2024-04-20 DIAGNOSIS — H5462 Unqualified visual loss, left eye, normal vision right eye: Secondary | ICD-10-CM

## 2024-04-20 LAB — CBC WITH DIFFERENTIAL/PLATELET
Abs Immature Granulocytes: 0.02 K/uL (ref 0.00–0.07)
Basophils Absolute: 0 K/uL (ref 0.0–0.1)
Basophils Relative: 1 %
Eosinophils Absolute: 0.1 K/uL (ref 0.0–0.5)
Eosinophils Relative: 2 %
HCT: 42 % (ref 39.0–52.0)
Hemoglobin: 14.8 g/dL (ref 13.0–17.0)
Immature Granulocytes: 0 %
Lymphocytes Relative: 24 %
Lymphs Abs: 1.6 K/uL (ref 0.7–4.0)
MCH: 32.5 pg (ref 26.0–34.0)
MCHC: 35.2 g/dL (ref 30.0–36.0)
MCV: 92.3 fL (ref 80.0–100.0)
Monocytes Absolute: 0.5 K/uL (ref 0.1–1.0)
Monocytes Relative: 8 %
Neutro Abs: 4.4 K/uL (ref 1.7–7.7)
Neutrophils Relative %: 65 %
Platelets: 243 K/uL (ref 150–400)
RBC: 4.55 MIL/uL (ref 4.22–5.81)
RDW: 12.5 % (ref 11.5–15.5)
WBC: 6.7 K/uL (ref 4.0–10.5)
nRBC: 0 % (ref 0.0–0.2)

## 2024-04-20 LAB — COMPREHENSIVE METABOLIC PANEL WITH GFR
ALT: 14 U/L (ref 0–44)
AST: 21 U/L (ref 15–41)
Albumin: 4.1 g/dL (ref 3.5–5.0)
Alkaline Phosphatase: 78 U/L (ref 38–126)
Anion gap: 12 (ref 5–15)
BUN: 23 mg/dL (ref 8–23)
CO2: 27 mmol/L (ref 22–32)
Calcium: 9.3 mg/dL (ref 8.9–10.3)
Chloride: 102 mmol/L (ref 98–111)
Creatinine, Ser: 1.18 mg/dL (ref 0.61–1.24)
GFR, Estimated: >60 mL/min (ref >60)
Glucose, Bld: 109 mg/dL — ABNORMAL HIGH (ref 70–99)
Potassium: 4 mmol/L (ref 3.5–5.1)
Sodium: 141 mmol/L (ref 135–145)
Total Bilirubin: 0.9 mg/dL (ref 0.0–1.2)
Total Protein: 6.6 g/dL (ref 6.5–8.1)

## 2024-04-20 LAB — SEDIMENTATION RATE: Sed Rate: 10 mm/h (ref 0–16)

## 2024-04-20 LAB — CBG MONITORING, ED: Glucose-Capillary: 103 mg/dL — ABNORMAL HIGH (ref 70–99)

## 2024-04-20 LAB — C-REACTIVE PROTEIN: CRP: 0.7 mg/dL (ref <1.0)

## 2024-04-20 LAB — PROTIME-INR
INR: 1 (ref 0.8–1.2)
Prothrombin Time: 13.4 s (ref 11.4–15.2)

## 2024-04-20 MED ORDER — ACETAMINOPHEN 500 MG PO TABS
1000.0000 mg | ORAL_TABLET | Freq: Once | ORAL | Status: AC
  Administered 2024-04-20: 1000 mg via ORAL
  Filled 2024-04-20: qty 2

## 2024-04-20 MED ORDER — IOHEXOL 350 MG/ML SOLN
75.0000 mL | Freq: Once | INTRAVENOUS | Status: AC | PRN
  Administered 2024-04-20: 75 mL via INTRAVENOUS

## 2024-04-20 NOTE — ED Notes (Signed)
 1st attempt to give phone report; no answer

## 2024-04-20 NOTE — ED Provider Notes (Signed)
 [facility] Provider Note   CSN: [id number] Arrival date & time: 04/20/24  1743     Patient presents with: Stroke Symptoms and Transient Ischemic Attack   Michael [name] is a 68 y.o. male.   Patient to ED with symptoms of unilateral vision loss that started suddenly about 1/2 hour ago. Since he had regained much of his sight but its still blurry. No headache, facial droop, weakness or numbness in extremities. He felt slightly confused but this has completely resolved.   The history is provided by the patient. No language interpreter was used.       Prior to Admission medications   Medication Sig Start Date End Date Taking? Authorizing Provider  aspirin  81 MG tablet Take 81 mg by mouth daily.    [provider]  atorvastatin  (LIPITOR ) 80 MG tablet TAKE 1 TABLET DAILY 10/21/23   Lavona Agent, MD  CHERRY PO Take 50 mg by mouth daily. Cherry extract    [provider]  Cinnamon 500 MG capsule Take 500 mg by mouth daily.    [provider]  clotrimazole -betamethasone  (LOTRISONE ) cream Apply 1 Application topically 2 (two) times daily. 04/13/24   May, Deanna J, NP  Coenzyme Q10 (Q-SORB CO Q-10) 100 MG capsule Take 1 capsule by mouth daily.    [provider]  famotidine  (PEPCID ) 40 MG tablet Take 1 tablet (40 mg total) by mouth daily. 07/15/22   [name], MD  hydrocortisone  (ANUSOL -HC) 2.5 % rectal cream Place rectally 2 (two) times daily. Patient needs office visit. 03/31/24   Zehr, Jessica D, PA-C  isosorbide  mononitrate (IMDUR ) 60 MG 24 hr tablet TAKE 1 TABLET DAILY 10/20/23   Lavona Agent, MD  nitroGLYCERIN  (NITROSTAT ) 0.4 MG SL tablet DISSOLVE 1 TABLET UNDER THE TONGUE EVERY 5 MINUTES AS NEEDED FOR CHEST PAIN UP TO 3 DOSES 11/28/19   Lavona Agent, MD  ranolazine  (RANEXA ) 500 MG 12 hr tablet Take 1 tablet (500 mg total) by mouth 2 (two) times daily. 10/03/23   [name], PA-C   ticagrelor  (BRILINTA ) 60 MG TABS tablet Take 1 tablet (60 mg total) by mouth 2 (two) times daily. 06/22/23   Lavona Agent, MD  Turmeric 500 MG CAPS Take 500 mg by mouth daily.    [provider]    Allergies: Patient has no known allergies.    Review of Systems  Updated Vital Signs BP (!) 125/53   Pulse 65   Temp 98 F (36.7 C)   Resp 10   SpO2 95%   Physical Exam Constitutional:      Appearance: He is well-developed.  HENT:     Head: Normocephalic.  Cardiovascular:     Rate and Rhythm: Normal rate and regular rhythm.     Heart sounds: No murmur heard. Pulmonary:     Effort: Pulmonary effort is normal.     Breath sounds: Normal breath sounds. No wheezing, rhonchi or rales.  Abdominal:     General: Bowel sounds are normal.     Palpations: Abdomen is soft.     Tenderness: There is no abdominal tenderness. There is no guarding or rebound.  Musculoskeletal:        General: Normal range of motion.     Cervical back: Normal range of motion and neck supple.  Skin:    General: Skin is warm and dry.  Neurological:     General: No focal deficit present.     Mental Status: He  is alert and oriented to person, place, and time.     GCS: GCS eye subscore is 4. GCS verbal subscore is 5. GCS motor subscore is 6.     Cranial Nerves: Cranial nerves 2-12 are intact.     Motor: No weakness or pronator drift.     Coordination: Finger-Nose-Finger Test normal.     Deep Tendon Reflexes:     Reflex Scores:      Patellar reflexes are 2+ on the right side and 2+ on the left side.    Comments: Slight lag to peripheral vision of right eye.      (all labs ordered are listed, but only abnormal results are displayed) Labs Reviewed  COMPREHENSIVE METABOLIC PANEL WITH GFR - Abnormal; Notable for the following components:      Result Value   Glucose, Bld 109 (*)    All other components within normal limits  CBG MONITORING, ED - Abnormal; Notable for the following components:    Glucose-Capillary 103 (*)    All other components within normal limits  CBC WITH DIFFERENTIAL/PLATELET  PROTIME-INR  SEDIMENTATION RATE  C-REACTIVE PROTEIN   Results for orders placed or performed during the hospital encounter of 04/20/24  CBG monitoring, ED   Collection Time: 04/20/24  5:46 PM  Result Value Ref Range   Glucose-Capillary 103 (H) 70 - 99 mg/dL  CBC with Differential   Collection Time: 04/20/24  6:18 PM  Result Value Ref Range   WBC 6.7 4.0 - 10.5 K/uL   RBC 4.55 4.22 - 5.81 MIL/uL   Hemoglobin 14.8 13.0 - 17.0 g/dL   HCT 57.9 60.9 - 47.9 %   MCV 92.3 80.0 - 100.0 fL   MCH 32.5 26.0 - 34.0 pg   MCHC 35.2 30.0 - 36.0 g/dL   RDW 87.4 88.4 - 84.4 %   Platelets 243 150 - 400 K/uL   nRBC 0.0 0.0 - 0.2 %   Neutrophils Relative % 65 %   Neutro Abs 4.4 1.7 - 7.7 K/uL   Lymphocytes Relative 24 %   Lymphs Abs 1.6 0.7 - 4.0 K/uL   Monocytes Relative 8 %   Monocytes Absolute 0.5 0.1 - 1.0 K/uL   Eosinophils Relative 2 %   Eosinophils Absolute 0.1 0.0 - 0.5 K/uL   Basophils Relative 1 %   Basophils Absolute 0.0 0.0 - 0.1 K/uL   Immature Granulocytes 0 %   Abs Immature Granulocytes 0.02 0.00 - 0.07 K/uL  Comprehensive metabolic panel   Collection Time: 04/20/24  6:18 PM  Result Value Ref Range   Sodium 141 135 - 145 mmol/L   Potassium 4.0 3.5 - 5.1 mmol/L   Chloride 102 98 - 111 mmol/L   CO2 27 22 - 32 mmol/L   Glucose, Bld 109 (H) 70 - 99 mg/dL   BUN 23 8 - 23 mg/dL   Creatinine, Ser 8.81 0.61 - 1.24 mg/dL   Calcium  9.3 8.9 - 10.3 mg/dL   Total Protein 6.6 6.5 - 8.1 g/dL   Albumin 4.1 3.5 - 5.0 g/dL   AST 21 15 - 41 U/L   ALT 14 0 - 44 U/L   Alkaline Phosphatase 78 38 - 126 U/L   Total Bilirubin 0.9 0.0 - 1.2 mg/dL   GFR, Estimated >39 >39 mL/min   Anion gap 12 5 - 15  Protime-INR   Collection Time: 04/20/24  6:18 PM  Result Value Ref Range   Prothrombin Time 13.4 11.4 - 15.2 seconds   INR 1.0  0.8 - 1.2    EKG: EKG  Interpretation Date/Time:  Wednesday April 20 2024 18:08:31 EDT Ventricular Rate:  75 PR Interval:  157 QRS Duration:  103 QT Interval:  402 QTC Calculation: 449 R Axis:   55  Text Interpretation: Sinus rhythm Low voltage, precordial leads No acute cahnges when compared with prior EKG from 10/03/2023 Confirmed by Gennaro Bouchard ([id number]) on 04/20/2024 6:19:46 PM  Radiology: CT Angio Head Neck W WO CM Result Date: 04/20/2024 CLINICAL DATA:  Vision loss, monocular EXAM: CT ANGIOGRAPHY HEAD AND NECK WITH AND WITHOUT CONTRAST TECHNIQUE: Multidetector CT imaging of the head and neck was performed using the standard protocol during bolus administration of intravenous contrast. Multiplanar CT image reconstructions and MIPs were obtained to evaluate the vascular anatomy. Carotid stenosis measurements (when applicable) are obtained utilizing NASCET criteria, using the distal internal carotid diameter as the denominator. RADIATION DOSE REDUCTION: This exam was performed according to the departmental dose-optimization program which includes automated exposure control, adjustment of the mA and/or kV according to patient size and/or use of iterative reconstruction technique. CONTRAST:  75mL OMNIPAQUE  IOHEXOL  350 MG/ML SOLN COMPARISON:  None Available. FINDINGS: CT HEAD FINDINGS Brain: Remote right occipital infarct. No evidence of acute large vascular territory infarct, acute hemorrhage, mass lesion or midline shift. Vascular: See below. Skull: No acute fracture. Sinuses/Orbits: Clear sinuses.  No acute orbital findings. Other: No mastoid effusions. Review of the MIP images confirms the above findings CTA NECK FINDINGS Aortic arch: Great vessel origins are patent without proximal hemodynamically significant stenosis. Right carotid system: No evidence of dissection, stenosis (50% or greater), or occlusion. Left carotid system: No evidence of dissection, stenosis (50% or greater), or occlusion. Vertebral arteries:  Codominant. No evidence of dissection, stenosis (50% or greater), or occlusion. Skeleton: No evidence of acute fracture. Other neck: No evidence of acute abnormality. Upper chest: Visualized lung apices are clear. Review of the MIP images confirms the above findings CTA HEAD FINDINGS Anterior circulation: Bilateral intracranial ICAs, MCAs, and ACAs are patent without proximal hemodynamically significant stenosis. No aneurysm identified. Posterior circulation: Bilateral intradural vertebral arteries, basilar artery and bilateral posterior cerebral arteries are patent without proximal hemodynamically significant stenosis. No aneurysm identified. Venous sinuses: As permitted by contrast timing, patent. Review of the MIP images confirms the above findings IMPRESSION: 1. No large vessel occlusion or proximal hemodynamically significant stenosis. 2. Remote right occipital infarct. Electronically Signed   By: Gilmore GORMAN Molt M.D.   On: 04/20/2024 19:23     Procedures   Medications Ordered in the ED  acetaminophen  (TYLENOL ) tablet 1,000 mg (has no administration in time range)  iohexol  (OMNIPAQUE ) 350 MG/ML injection 75 mL (75 mLs Intravenous Contrast Given 04/20/24 1844)    Clinical Course as of 04/20/24 2018  Wed Apr 20, 2024  1944 Recheck - vision is back to baseline. Ambulates with steady gait - no dizziness, lightheadedness, headache, weakness. Will discuss with neurology.  [SU]    Clinical Course User Index [SU] Odell Balls, PA-C                                 Medical Decision Making This patient presents to the ED for concern of vision loss, this involves an extensive number of treatment options, and is a complaint that carries with it a high risk of complications and morbidity.  The differential diagnosis includes CVA, amaurosis fugax, central retinal artery occlusion, central retinal vein occlusion, retinal detachment,  vitreous hemorrhage, temporal arteritis   Co morbidities that  complicate the patient evaluation  CAD s/p stents x 5, HTN, HLD,    Additional history obtained:  Additional history and/or information obtained from chart review, notable for extensive cardiac history of CAD, recurrent chest pain   Lab Tests:  I Ordered, and personally interpreted labs.  The pertinent results include:   CBC - no leukocytosis, normal hgb, normal plts Cmet: no electrolyte abnormalities, normal renal function INR - normal   Imaging Studies ordered:  I ordered imaging studies including CTA head and neck Per radiologist interpretation:  IMPRESSION: 1. No large vessel occlusion or proximal hemodynamically significant stenosis. 2. Remote right occipital infarct.    Cardiac Monitoring:  The patient was maintained on a cardiac monitor.  I personally viewed and interpreted the cardiac monitored which showed an underlying rhythm of:  EKG Interpretation Date/Time:  Wednesday April 20 2024 18:08:31 EDT Ventricular Rate:  75 PR Interval:  157 QRS Duration:  103 QT Interval:  402 QTC Calculation: 449 R Axis:   55  Text Interpretation: Sinus rhythm Low voltage, precordial leads No acute cahnges when compared with prior EKG from 10/03/2023 Confirmed by Gennaro Bouchard ([id number]) on 04/20/2024 6:19:46 PM   Medicines ordered and prescription drug management:  I ordered medication including tylenol   for HA (right sinus) Reevaluation of the patient after these medicines showed that the patient  I have reviewed the patients home medicines and have made adjustments as needed   Test Considered:  N/a   Critical Interventions:  N/a   Consultations Obtained:  I requested consultation with the neurology, Dr. Michaela,  and discussed lab and imaging findings as well as pertinent plan - they recommend: Admit to medicine, TIA work up, neuro to see him on arrival to [facility] ED   Problem List / ED Course:  Here with sudden onset black out vision of right eye around  5:30, short duration of symptoms with almost back to baseline within the hour No headache, no other symptoms CTA head and neck - negative per radiology Labs reassuring Discussed with neurology, Dr. Michaela. Already taking Brilinta  and aspirin  - no other medication at this time Hospitalist paged for admission when transfer can be arranged.    Reevaluation:  After the interventions noted above, I reevaluated the patient and found that they have :resolved   Social Determinants of Health:  Never a smoker   Disposition:  After consideration of the diagnostic results and the patients response to treatment, I feel that the patient would benefit from admit to medicine, neurology consult.  Patient and wife updated.    Amount and/or Complexity of Data Reviewed Labs: ordered. Radiology: ordered.  Risk OTC drugs. Prescription drug management.        Final diagnoses:  Amaurosis fugax of right eye    ED Discharge Orders     None          Odell Balls, PA-C 04/20/24 2018    Gennaro Bouchard L, DO 04/21/24 1914

## 2024-04-20 NOTE — ED Notes (Signed)
 [facility] at bedside to transport pt to [facility]

## 2024-04-20 NOTE — ED Notes (Signed)
 Given RN Monica contact [phone] for report; forwarded to voicemail

## 2024-04-20 NOTE — ED Triage Notes (Signed)
 Patient states at 1730 his left eye went black and he lost his vision. He reports vision has returned and is now blurry. He has no other neuro deficits. His NIH was 0 in triage.

## 2024-04-21 ENCOUNTER — Observation Stay (HOSPITAL_BASED_OUTPATIENT_CLINIC_OR_DEPARTMENT_OTHER)

## 2024-04-21 ENCOUNTER — Ambulatory Visit: Admitting: Family Medicine

## 2024-04-21 ENCOUNTER — Encounter (HOSPITAL_COMMUNITY): Payer: Self-pay | Admitting: Radiology

## 2024-04-21 ENCOUNTER — Observation Stay (HOSPITAL_COMMUNITY)

## 2024-04-21 DIAGNOSIS — G453 Amaurosis fugax: Secondary | ICD-10-CM

## 2024-04-21 DIAGNOSIS — K219 Gastro-esophageal reflux disease without esophagitis: Secondary | ICD-10-CM

## 2024-04-21 DIAGNOSIS — G459 Transient cerebral ischemic attack, unspecified: Secondary | ICD-10-CM | POA: Diagnosis not present

## 2024-04-21 DIAGNOSIS — H5462 Unqualified visual loss, left eye, normal vision right eye: Secondary | ICD-10-CM | POA: Diagnosis not present

## 2024-04-21 DIAGNOSIS — H53122 Transient visual loss, left eye: Secondary | ICD-10-CM | POA: Diagnosis not present

## 2024-04-21 LAB — ECHOCARDIOGRAM COMPLETE
Area-P 1/2: 3.42 cm2
Calc EF: 50.4 %
Height: 66 in
S' Lateral: 3.98 cm
Single Plane A2C EF: 50.9 %
Single Plane A4C EF: 48.6 %
Weight: 3072.01 [oz_av]

## 2024-04-21 LAB — LIPID PANEL
Cholesterol: 187 mg/dL (ref 0–200)
HDL: 51 mg/dL (ref >40)
LDL Cholesterol: 121 mg/dL — ABNORMAL HIGH (ref 0–99)
Total CHOL/HDL Ratio: 3.7 ratio
Triglycerides: 74 mg/dL (ref <150)
VLDL: 15 mg/dL (ref 0–40)

## 2024-04-21 LAB — CBC
HCT: 41.6 % (ref 39.0–52.0)
Hemoglobin: 14.7 g/dL (ref 13.0–17.0)
MCH: 32.5 pg (ref 26.0–34.0)
MCHC: 35.3 g/dL (ref 30.0–36.0)
MCV: 91.8 fL (ref 80.0–100.0)
Platelets: 245 K/uL (ref 150–400)
RBC: 4.53 MIL/uL (ref 4.22–5.81)
RDW: 12.3 % (ref 11.5–15.5)
WBC: 8.3 K/uL (ref 4.0–10.5)
nRBC: 0 % (ref 0.0–0.2)

## 2024-04-21 LAB — CREATININE, SERUM
Creatinine, Ser: 1 mg/dL (ref 0.61–1.24)
GFR, Estimated: >60 mL/min (ref >60)

## 2024-04-21 LAB — HEMOGLOBIN A1C
Hgb A1c MFr Bld: 5.4 % (ref 4.8–5.6)
Mean Plasma Glucose: 108.28 mg/dL

## 2024-04-21 MED ORDER — ENOXAPARIN SODIUM 40 MG/0.4ML IJ SOSY
40.0000 mg | PREFILLED_SYRINGE | Freq: Every day | INTRAMUSCULAR | Status: DC
  Administered 2024-04-21 – 2024-04-22 (×2): 40 mg via SUBCUTANEOUS
  Filled 2024-04-21 (×2): qty 0.4

## 2024-04-21 MED ORDER — ISOSORBIDE MONONITRATE ER 30 MG PO TB24
30.0000 mg | ORAL_TABLET | Freq: Every day | ORAL | Status: DC
  Administered 2024-04-21 – 2024-04-22 (×2): 30 mg via ORAL
  Filled 2024-04-21 (×2): qty 1

## 2024-04-21 MED ORDER — STROKE: EARLY STAGES OF RECOVERY BOOK
Freq: Once | Status: AC
  Filled 2024-04-21: qty 1

## 2024-04-21 MED ORDER — ACETAMINOPHEN 160 MG/5ML PO SOLN: 650.0000 mg | Freq: Four times a day (QID) | ORAL | Status: DC | PRN

## 2024-04-21 MED ORDER — ATORVASTATIN CALCIUM 80 MG PO TABS
80.0000 mg | ORAL_TABLET | Freq: Every day | ORAL | Status: DC
  Administered 2024-04-21 – 2024-04-22 (×2): 80 mg via ORAL
  Filled 2024-04-21 (×2): qty 1

## 2024-04-21 MED ORDER — FAMOTIDINE 20 MG PO TABS
40.0000 mg | ORAL_TABLET | Freq: Every day | ORAL | Status: DC
  Administered 2024-04-21: 40 mg via ORAL
  Filled 2024-04-21: qty 2

## 2024-04-21 MED ORDER — TICAGRELOR 60 MG PO TABS
60.0000 mg | ORAL_TABLET | Freq: Two times a day (BID) | ORAL | Status: DC
  Administered 2024-04-21 – 2024-04-22 (×2): 60 mg via ORAL
  Filled 2024-04-21 (×3): qty 1

## 2024-04-21 MED ORDER — ACETAMINOPHEN 325 MG PO TABS
650.0000 mg | ORAL_TABLET | Freq: Four times a day (QID) | ORAL | Status: DC | PRN
  Administered 2024-04-21: 650 mg via ORAL
  Filled 2024-04-21: qty 2

## 2024-04-21 MED ORDER — COENZYME Q10 100 MG PO CAPS: 1.0000 | ORAL_CAPSULE | Freq: Every day | ORAL | Status: DC

## 2024-04-21 MED ORDER — ASPIRIN 81 MG PO CHEW
81.0000 mg | CHEWABLE_TABLET | Freq: Every day | ORAL | Status: DC
  Administered 2024-04-21 – 2024-04-22 (×2): 81 mg via ORAL
  Filled 2024-04-21 (×2): qty 1

## 2024-04-21 MED ORDER — RANOLAZINE ER 500 MG PO TB12
500.0000 mg | ORAL_TABLET | Freq: Two times a day (BID) | ORAL | Status: DC
  Administered 2024-04-21 – 2024-04-22 (×2): 500 mg via ORAL
  Filled 2024-04-21 (×3): qty 1

## 2024-04-21 MED ORDER — ACETAMINOPHEN 650 MG RE SUPP: 650.0000 mg | Freq: Four times a day (QID) | RECTAL | Status: DC | PRN

## 2024-04-21 NOTE — Progress Notes (Signed)
 SLP Cancellation Note  Patient Details Name: Michael [name] MRN: [id number] DOB: 03-29-1956   Cancelled treatment:        Orders for speech language assessment received and appreciated.  Pt denies changes to thinking, memory, and speech with this hospital admission. His speech is clear, he is oriented.  Nursing reports no cognitive or communication deficits.  Evaluation deferred at this time. Should MRI be concerning for acute changes, consider further assessment of cognition.   Anette FORBES Grippe, MA, CCC-SLP Acute Rehabilitation Services Office: [phone] 04/21/2024, 10:17 AM

## 2024-04-21 NOTE — Care Management Obs Status (Signed)
 MEDICARE OBSERVATION STATUS NOTIFICATION   Patient Details  Name: Michael [name] MRN: [id number] Date of Birth: 15-Jun-1956   Medicare Observation Status Notification Given:  Yes   Letter signed copy given Claretta Deed 04/21/2024, 1:52 PM

## 2024-04-21 NOTE — Progress Notes (Signed)
 Transition of Care [facility]) - Inpatient Brief Assessment   Patient Details  Name: Michael [name] MRN: [id number] Date of Birth: 1956-06-07  Transition of [facility] CM/SW Contact:    Rosaline JONELLE Joe, RN Phone Number: 04/21/2024, 12:01 PM   Clinical Narrative: Patient admitted from home with Transient unilateral left eye vision loss.  No IP Care management needs at this time.  Please place [facility] consult if needs arise for the patient.   Transition of Care Asessment: Insurance and Status: (P) Insurance coverage has been reviewed Patient has primary care physician: (P) Yes Home environment has been reviewed: (P) from home with spouse Prior level of function:: (P) self Prior/Current Home Services: (P) No current home services Social Drivers of Health Review: (P) SDOH reviewed no interventions necessary Readmission risk has been reviewed: (P) Yes Transition of care needs: (P) no transition of care needs at this time

## 2024-04-21 NOTE — Progress Notes (Signed)
 Swallow screen was performed to this patient at bedside. Patient passed the evaluation.

## 2024-04-21 NOTE — Progress Notes (Signed)
 Patient is off the unit. Transported in a wheelchair, went to MRI.

## 2024-04-21 NOTE — Evaluation (Signed)
 Occupational Therapy Evaluation/Discharge Patient Details Name: Michael [name] MRN: [id number] DOB: 1955/10/05 Today's Date: 04/21/2024   History of Present Illness   Pt is a 68 y/o male presenting to [facility] after experiencing headache and L eye vision loss. Pt transferred to [facility] with vision issues resolving; admitted for TIA workup. CTA head/neck negative. CT head showed remote occipital infarct. Pending MRI brain. PMH: CAD, HTN, HLD, cardiac cath     Clinical Impressions PTA, pt lives with family, typically completely independent and active at baseline. Pt presents now with vision back to baseline and no other functional deficits noted. Pt able to manage ADLs/hallway mobility Independently and without issues. No further skilled therapy services needed at acute level. Pt functionally appropriate for DC home once medically cleared. Encouraged continued mobility in hallway while pt admitted.      If plan is discharge home, recommend the following:         Functional Status Assessment   Patient has not had a recent decline in their functional status     Equipment Recommendations   None recommended by OT     Recommendations for Other Services         Precautions/Restrictions   Precautions Precautions: None Recall of Precautions/Restrictions: Intact Restrictions Weight Bearing Restrictions Per Provider Order: No     Mobility Bed Mobility Overal bed mobility: Independent                  Transfers Overall transfer level: Independent Equipment used: None                      Balance Overall balance assessment: Independent                                         ADL either performed or assessed with clinical judgement   ADL Overall ADL's : Independent                                             Vision Baseline Vision/History: 1 Wears glasses Ability to See in Adequate Light: 0  Adequate Patient Visual Report: No change from baseline Vision Assessment?: Yes;No apparent visual deficits Eye Alignment: Within Functional Limits Ocular Range of Motion: Within Functional Limits Alignment/Gaze Preference: Within Defined Limits Tracking/Visual Pursuits: Able to track stimulus in all quads without difficulty Convergence: Within functional limits Visual Fields: No apparent deficits Additional Comments: visual field intact, vision back to baseline     Perception Perception: Within Functional Limits       Praxis Praxis: WFL       Pertinent Vitals/Pain Pain Assessment Pain Assessment: Faces Faces Pain Scale: Hurts a little bit Pain Location: headache Pain Descriptors / Indicators: Headache Pain Intervention(s): Monitored during session     Extremity/Trunk Assessment Upper Extremity Assessment Upper Extremity Assessment: Overall WFL for tasks assessed;Right hand dominant;RUE deficits/detail RUE Deficits / Details: reports 3 month issue with shoulder. imaging showing tears and tendenosis. Pt reports already following up OP with this. AROM WFL and able to use UE functionally without issues   Lower Extremity Assessment Lower Extremity Assessment: Overall WFL for tasks assessed   Cervical / Trunk Assessment Cervical / Trunk Assessment: Normal   Communication Communication Communication: No apparent difficulties   Cognition Arousal: Alert Behavior During  Therapy: WFL for tasks assessed/performed Cognition: No apparent impairments                               Following commands: Intact       Cueing  General Comments   Cueing Techniques: Verbal cues      Exercises     Shoulder Instructions      Home Living Family/patient expects to be discharged to:: Private residence Living Arrangements: Spouse/significant other;Children Available Help at Discharge: Family Type of Home: House Home Access: Stairs to enter Secretary/administrator of  Steps: 3-4   Home Layout: One level     Bathroom Shower/Tub: Producer, television/film/video: Standard     Home Equipment: None   Additional Comments: adult children live with pt and spouse; one with CP and one with high functioning autism      Prior Functioning/Environment Prior Level of Function : Independent/Modified Independent;Driving             Mobility Comments: no AD ADLs Comments: Completely independent, goes to gym. driving. retired    OT Problem List:     OT Treatment/Interventions:        OT Goals(Current goals can be found in the care plan section)   Acute Rehab OT Goals Patient Stated Goal: finish tests, be able to go home OT Goal Formulation: All assessment and education complete, DC therapy   OT Frequency:       Co-evaluation              AM-PAC OT 6 Clicks Daily Activity     Outcome Measure Help from another person eating meals?: None Help from another person taking care of personal grooming?: None Help from another person toileting, which includes using toliet, bedpan, or urinal?: None Help from another person bathing (including washing, rinsing, drying)?: None Help from another person to put on and taking off regular upper body clothing?: None Help from another person to put on and taking off regular lower body clothing?: None 6 Click Score: 24   End of Session Nurse Communication: Mobility status  Activity Tolerance: Patient tolerated treatment well Patient left: in bed;with call bell/phone within reach;with nursing/sitter in room  OT Visit Diagnosis: Low vision, both eyes (H54.2)                Time: 9240-9182 OT Time Calculation (min): 18 min Charges:  OT General Charges $OT Visit: 1 Visit OT Evaluation $OT Eval Low Complexity: 1 Low  Mliss NOVAK, OTR/L Acute Rehab Services Office: [phone]   Mliss Fish 04/21/2024, 8:47 AM

## 2024-04-21 NOTE — Plan of Care (Signed)

## 2024-04-21 NOTE — Progress Notes (Signed)
 [facility] ROUNDING NOTE Giovanni Biby Dicenso FMW:[id number]  DOB: Nov 24, 1955  DOA: 04/20/2024  PCP: [name], MD  04/21/2024,7:13 AM  LOS: 0 days    Code Status:  Full     From: Presumed home current Dispo: Home    68 year old white male Prior CAD previous stenting 2017-Myoview  2020 intermediate risk, prior HFrEF with improvement unstable angina 1/9-1/11 admission status post LHC + microvascular disease medically managed Multiple orthopedic concerns including partial nontraumatic right rotator cuff tear followed by Dr. Christyne Sharps Personal history of adenomatous colonic polyps-reflux Cervicalgia previously followed by PT  7/30 unilateral left eye monocular visual deficit?  Amaurosis NIH is 0 on arrival with EMS Seen by neurology who recommended inpatient workup MRI brain stroke team to follow and full stroke workup as could not rule out TIA   Plan  Amaurosis fugax? Work up so far neg-await MR brain--previous asymptomatic occipital stroke on the past--- unclear if his cervicalgia versus vertebral artery narrowing could have caused this event? Defer to neurology further workup and planning Okay for diet cleared by therapy services eventually for discharge May require 30-day monitor-reach out to cardiology Resume Lipitor  80 as LDL is 121 HDL is 51, aspirin  81 and continue Brilinta  given no large vessel occlusion or risk of hemorrhage  Mildly impaired glucose tolerance A1c 5.4 Diet exercise etc. as outpatient  Underlying extensive CAD history HFrEF with improvement Medical management of recent unstable angina Given it has been close to 24 hours we will start back Imdur  at lower dose of 30 mg Ranexa  500 twice daily  Cervicalgia Seems to be stable not on significant pain meds See above discussion  Rotator cuff tear Outpatient follow-up   Data Reviewed:   Sodium 141 potassium 4.0 BUN/creatinine 23/1.1 WBC 6.7 hemoglobin 14.8 platelet 243-LDL 121  DVT prophylaxis:  Lovenox   Status is: Observation The patient will require care spanning > 2 midnights and should be moved to inpatient because:   Awaiting workup and neurology input     Subjective: No complaints no further visual loss-no motor deficits at all Son confirms he is at his baseline    Objective + exam Vitals:   04/20/24 2145 04/20/24 2233 04/21/24 0000 04/21/24 0316  BP: (!) 146/75 (!) 157/79  (!) 146/64  Pulse: 66 61  69  Resp: 18 20  20   Temp:  (!) 97.5 F (36.4 C)  97.8 F (36.6 C)  TempSrc:  Oral  Oral  SpO2: 98% 99%  96%  Weight:   87.1 kg   Height:   5' 6 (1.676 m)    Filed Weights   04/21/24 0000  Weight: 87.1 kg     Examination: EOMI NCAT no focal deficit power 5/5 reflexes 2/3 sensory grossly intact to fine touch Smile symmetric vision by direct confrontation is equal bilaterally Pupils equally reactive No dysdiadochokinesia S1-S2 no murmur Telemetry shows PVC with sinus arrhythmia Abdomen soft no rebound     Scheduled Meds:  [START ON 04/22/2024]  stroke: early stages of recovery book   Does not apply Once   enoxaparin  (LOVENOX ) injection  40 mg Subcutaneous Daily   Continuous Infusions:  Time 45  Jai-Gurmukh Seraj Dunnam, MD  [facility]

## 2024-04-21 NOTE — Consult Note (Signed)
 NEUROLOGY CONSULT NOTE   Date of service: April 21, 2024 Patient Name: Michael [name] MRN:  [id number] DOB:  08-Dec-1955 Chief Complaint: Transient left eye vision loss Requesting Provider: Franky Redia SAILOR, MD  History of Present Illness  Michael [name] is a 68 y.o. male with hx of CAD, hyperlipidemia, hypertension who presents with transient visual loss.  He states that his left eye went black and remained so for about 40 minutes prior to returning.  He sought care in the emergency department at [facility] by which point his symptoms had resolved.  He denies any other symptoms such as numbness, weakness.  He did have mild headache.  He did not try closing each eye sequentially.  CTA was done which did not demonstrate any focal stenosis, but he does have a previous right occipital stroke.  LKW: 5:30 PM IV Thrombolysis: No, resolution of symptoms EVT: No, resolution of symptoms  NIHSS: Zero  Past History   Past Medical History:  Diagnosis Date   Acute lateral meniscal tear, right, initial encounter 09/26/2021   Injection given September 26, 2018.   CAD S/P PCI to Cx & RCA    a. s/p PCI of LCx (2006) and ostium of RCA (2007) as well as DES to RCA in [place] (05/2014)   HTN (hypertension)    Hyperlipidemia    Plantar fasciitis 10/12/2017    Past Surgical History:  Procedure Laterality Date   CARDIAC CATHETERIZATION N/A 05/06/2016   Procedure: Left Heart Cath and Coronary Angiography;  Surgeon: Lonni Hanson, MD;  Location: [facility] INVASIVE CV LAB;  Service: Cardiovascular;  Laterality: N/A;   CARDIAC CATHETERIZATION N/A 05/06/2016   Procedure: Coronary Stent Intervention;  Surgeon: Lonni Hanson, MD;  Location: [facility] INVASIVE CV LAB;  Service: Cardiovascular;  Laterality: N/A;   CARDIAC CATHETERIZATION N/A 05/13/2016   Procedure: Coronary/Graft Angiography;  Surgeon: Peter M Swaziland, MD;  Location: [facility] INVASIVE CV LAB;  Service: Cardiovascular;   Laterality: N/A;   CORONARY ANGIOPLASTY WITH STENT PLACEMENT  2006   in [place] , occluded CFX, s/p Taxus stent   CORONARY ANGIOPLASTY WITH STENT PLACEMENT  2007   in [place] , ostial RCA stent   CORONARY ANGIOPLASTY WITH STENT PLACEMENT  2015   in [place] , RCA stent   CORONARY PRESSURE/FFR STUDY N/A 10/02/2023   Procedure: CORONARY PRESSURE/FFR STUDY;  Surgeon: Wendel Lurena POUR, MD;  Location: [facility] INVASIVE CV LAB;  Service: Cardiovascular;  Laterality: N/A;   LEFT HEART CATH AND CORONARY ANGIOGRAPHY N/A 10/02/2023   Procedure: LEFT HEART CATH AND CORONARY ANGIOGRAPHY;  Surgeon: Wendel Lurena POUR, MD;  Location: [facility] INVASIVE CV LAB;  Service: Cardiovascular;  Laterality: N/A;   TONSILLECTOMY AND ADENOIDECTOMY       Family History: Family History  Problem Relation Age of Onset   Hypertension Father    Dementia Father        Vascular. patient states dementia/possible alzheimers as well   Hyperlipidemia Sister    Cancer Brother        type unknown   Cancer Brother        type unknown   Heart disease Maternal Grandfather 4       Died probably of heart diseasse   Heart disease Maternal Uncle 70       Died of heart exploding   Cerebral palsy Daughter    Stroke Son        severe problems with blood clotting   Colon cancer Neg Hx    Esophageal cancer Neg Hx  Pancreatic cancer Neg Hx    Stomach cancer Neg Hx    Liver disease Neg Hx     Social History  reports that he has never smoked. He has never used smokeless tobacco. He reports that he does not drink alcohol and does not use drugs.  No Known Allergies  Medications   Current Facility-Administered Medications:    [START ON 04/22/2024]  stroke: early stages of recovery book, , Does not apply, Once, Alfornia Madison, MD   acetaminophen  (TYLENOL ) tablet 650 mg, 650 mg, Oral, Q6H PRN **OR** acetaminophen  (TYLENOL ) 160 MG/5ML solution 650 mg, 650 mg, Per Tube, Q6H PRN **OR** acetaminophen  (TYLENOL ) suppository 650 mg,  650 mg, Rectal, Q6H PRN, Alfornia Madison, MD   enoxaparin  (LOVENOX ) injection 40 mg, 40 mg, Subcutaneous, Daily, Alfornia Madison, MD  Vitals   Vitals:   05-04-2024 2015 05/04/2024 2145 2024-05-04 2233 04/21/24 0000  BP: (!) 149/73 (!) 146/75 (!) 157/79   Pulse: 68 66 61   Resp: 10 18 20    Temp:   (!) 97.5 F (36.4 C)   TempSrc:   Oral   SpO2: 99% 98% 99%   Weight:    87.1 kg  Height:    5' 6 (1.676 m)    Body mass index is 30.99 kg/m.   Physical Exam   Constitutional: Appears well-developed and well-nourished.  Neurologic Examination    Neuro: Mental Status: Patient is awake, alert, oriented to person, place, month, year, and situation. Patient is able to give a clear and coherent history. No signs of aphasia or neglect Cranial Nerves: II: Visual Fields are full. Pupils are equal, round, and reactive to light.   III,IV, VI: EOMI without ptosis or diploplia.  V: Facial sensation is symmetric to temperature VII: Facial movement is symmetric.  VIII: hearing is intact to voice X: Uvula elevates symmetrically XII: tongue is midline without atrophy or fasciculations.  Motor: Tone is normal. Bulk is normal. 5/5 strength was present in all four extremities.  Sensory: Sensation is symmetric to light touch and temperature in the arms and legs. Cerebellar: FNF intact bilaterally        Labs/Imaging/Neurodiagnostic studies   CBC:  Recent Labs  Lab 05/04/2024 1818  WBC 6.7  NEUTROABS 4.4  HGB 14.8  HCT 42.0  MCV 92.3  PLT 243   Basic Metabolic Panel:  Lab Results  Component Value Date   NA 141 04-May-2024   K 4.0 05/04/2024   CO2 27 May 04, 2024   GLUCOSE 109 (H) 05-04-24   BUN 23 2024-05-04   CREATININE 1.18 May 04, 2024   CALCIUM  9.3 May 04, 2024   GFRNONAA >60 05/04/24   GFRAA 96 08/03/2020   Lipid Panel:  Lab Results  Component Value Date   LDLCALC 55 10/02/2023   HgbA1c:  Lab Results  Component Value Date   HGBA1C 6.2 (H) 10/01/2023   Urine  Drug Screen: No results found for: LABOPIA, COCAINSCRNUR, LABBENZ, AMPHETMU, THCU, LABBARB  Alcohol Level No results found for: [facility] INR  Lab Results  Component Value Date   INR 1.0 04-May-2024   APTT No results found for: APTT AED levels: No results found for: PHENYTOIN, ZONISAMIDE, LAMOTRIGINE, LEVETIRACETA   CT angio Head and Neck with contrast(Personally reviewed): Previous small medial occipital stroke in the right  ASSESSMENT   Michael [name] is a 68 y.o. male with amaurosis fugax.  He did not sequentially test each eye with the other close so there is a possibility that this is a visual field issue as opposed  to amaurosis fugax, but either way I would favor treating this as TIA.  RECOMMENDATIONS  Continue home aspirin  and Brilinta  Continue home atorvastatin  80 LDL, A1c Telemetry, echo MRI brain Stroke team to follow ______________________________________________________________________    Signed, Aisha Seals, MD [facility]

## 2024-04-21 NOTE — Progress Notes (Signed)
 PT Cancellation Note  Patient Details Name: Michael [name] MRN: [id number] DOB: 11-Jul-1956   Cancelled Treatment:    Reason Eval/Treat Not Completed: (P) PT screened, no needs identified, will sign off  [name] B. Fleeta Lapidus PT, DPT Acute Rehabilitation Services Please use secure chat or  Call Office [phone]   [name] [facility] 04/21/2024, 8:26 AM

## 2024-04-21 NOTE — Progress Notes (Signed)
 Patient is back in the floor, came from MRI.

## 2024-04-21 NOTE — Plan of Care (Signed)

## 2024-04-21 NOTE — Plan of Care (Signed)
 Problem: Education: Goal: Knowledge of General Education information will improve Description: Including pain rating scale, medication(s)/side effects and non-pharmacologic comfort measures 04/21/2024 0229 by Evern Monica HERO, RN Outcome: Progressing 04/21/2024 0012 by Evern Monica HERO, RN Outcome: Progressing   Problem: Health Behavior/Discharge Planning: Goal: Ability to manage health-related needs will improve 04/21/2024 0229 by Evern Monica HERO, RN Outcome: Progressing 04/21/2024 0012 by Evern Monica HERO, RN Outcome: Progressing   Problem: Clinical Measurements: Goal: Ability to maintain clinical measurements within normal limits will improve 04/21/2024 0229 by Evern Monica HERO, RN Outcome: Progressing 04/21/2024 0012 by Evern Monica HERO, RN Outcome: Progressing Goal: Will remain free from infection 04/21/2024 0229 by Evern Monica HERO, RN Outcome: Progressing 04/21/2024 0012 by Evern Monica HERO, RN Outcome: Progressing Goal: Diagnostic test results will improve 04/21/2024 0229 by Evern Monica HERO, RN Outcome: Progressing 04/21/2024 0012 by Evern Monica HERO, RN Outcome: Progressing Goal: Respiratory complications will improve 04/21/2024 0229 by Evern Monica HERO, RN Outcome: Progressing 04/21/2024 0012 by Evern Monica HERO, RN Outcome: Progressing Goal: Cardiovascular complication will be avoided 04/21/2024 0229 by Evern Monica HERO, RN Outcome: Progressing 04/21/2024 0012 by Evern Monica HERO, RN Outcome: Progressing   Problem: Activity: Goal: Risk for activity intolerance will decrease 04/21/2024 0229 by Evern Monica HERO, RN Outcome: Progressing 04/21/2024 0012 by Evern Monica HERO, RN Outcome: Progressing   Problem: Nutrition: Goal: Adequate nutrition will be maintained 04/21/2024 0229 by Evern Monica HERO, RN Outcome: Progressing 04/21/2024 0012 by Evern Monica HERO, RN Outcome: Progressing   Problem: Coping: Goal: Level of anxiety will decrease 04/21/2024  0229 by Evern Monica HERO, RN Outcome: Progressing 04/21/2024 0012 by Evern Monica HERO, RN Outcome: Progressing   Problem: Elimination: Goal: Will not experience complications related to bowel motility 04/21/2024 0229 by Evern Monica HERO, RN Outcome: Progressing 04/21/2024 0012 by Evern Monica HERO, RN Outcome: Progressing Goal: Will not experience complications related to urinary retention 04/21/2024 0229 by Evern Monica HERO, RN Outcome: Progressing 04/21/2024 0012 by Evern Monica HERO, RN Outcome: Progressing   Problem: Pain Managment: Goal: General experience of comfort will improve and/or be controlled 04/21/2024 0229 by Evern Monica HERO, RN Outcome: Progressing 04/21/2024 0012 by Evern Monica HERO, RN Outcome: Progressing   Problem: Safety: Goal: Ability to remain free from injury will improve 04/21/2024 0229 by Evern Monica HERO, RN Outcome: Progressing 04/21/2024 0012 by Evern Monica HERO, RN Outcome: Progressing   Problem: Skin Integrity: Goal: Risk for impaired skin integrity will decrease 04/21/2024 0229 by Evern Monica HERO, RN Outcome: Progressing 04/21/2024 0012 by Evern Monica HERO, RN Outcome: Progressing   Problem: Education: Goal: Knowledge of disease or condition will improve Outcome: Progressing Goal: Knowledge of secondary prevention will improve (MUST DOCUMENT ALL) Outcome: Progressing Goal: Knowledge of patient specific risk factors will improve (DELETE if not current risk factor) Outcome: Progressing   Problem: Ischemic Stroke/TIA Tissue Perfusion: Goal: Complications of ischemic stroke/TIA will be minimized Outcome: Progressing   Problem: Coping: Goal: Will verbalize positive feelings about self Outcome: Progressing Goal: Will identify appropriate support needs Outcome: Progressing   Problem: Health Behavior/Discharge Planning: Goal: Ability to manage health-related needs will improve Outcome: Progressing Goal: Goals will be collaboratively  established with patient/family Outcome: Progressing   Problem: Self-Care: Goal: Ability to participate in self-care as condition permits will improve Outcome: Progressing Goal: Verbalization of feelings and concerns over difficulty with self-care will improve Outcome: Progressing Goal: Ability to communicate needs accurately will improve Outcome: Progressing   Problem: Nutrition: Goal: Risk of aspiration will decrease Outcome: Progressing  Goal: Dietary intake will improve Outcome: Progressing

## 2024-04-21 NOTE — H&P (Signed)
 History and Physical    Michael [name] FMW:[id number] DOB: 03-10-1956 DOA: 04/20/2024  PCP: Michael Garnette KIDD, MD  Patient coming from: [facility] ED  Chief Complaint: Vision loss  HPI: Michael [name] is a 68 y.o. male with medical history significant of CAD status post multiple PCIs on aspirin  and Brilinta , ischemic cardiomyopathy, hypertension, hyperlipidemia, GERD presenting with complaint of acute onset transient unilateral left eye vision loss.  Patient states this happened around 5:30 PM yesterday and his vision turned very dark and blurry.  This lasted about 30 to 40 minutes and resolved.  At present, his vision is back to his baseline.  He does report having frontal headache on arrival to the ED which has since resolved after he was given Tylenol .  Denies any previous episodes of vision changes and not aware of having history of stroke in the past.  He takes aspirin  and Brilinta  due to history of CAD with multiple stents.  Denies chest pain or shortness of breath.  No other complaints.  ED Course: Vital signs stable on arrival.  Labs including CBC and CMP without significant abnormalities.  ESR and CRP normal.  CTA head and neck showing no large vessel occlusion or proximal hemodynamically significant stenosis.  Showing remote right occipital infarct.  Patient was given Tylenol .  Neurology recommended admission for TIA workup.  Review of Systems:  Review of Systems  All other systems reviewed and are negative.   Past Medical History:  Diagnosis Date   Acute lateral meniscal tear, right, initial encounter 09/26/2021   Injection given September 26, 2018.   CAD S/P PCI to Cx & RCA    a. s/p PCI of LCx (2006) and ostium of RCA (2007) as well as DES to RCA in [place] (05/2014)   HTN (hypertension)    Hyperlipidemia    Plantar fasciitis 10/12/2017    Past Surgical History:  Procedure Laterality Date   CARDIAC CATHETERIZATION N/A 05/06/2016   Procedure: Left Heart Cath  and Coronary Angiography;  Surgeon: Michael Hanson, MD;  Location: [facility] INVASIVE CV LAB;  Service: Cardiovascular;  Laterality: N/A;   CARDIAC CATHETERIZATION N/A 05/06/2016   Procedure: Coronary Stent Intervention;  Surgeon: Michael Hanson, MD;  Location: [facility] INVASIVE CV LAB;  Service: Cardiovascular;  Laterality: N/A;   CARDIAC CATHETERIZATION N/A 05/13/2016   Procedure: Coronary/Graft Angiography;  Surgeon: Michael M Swaz[place], MD;  Location: [facility] INVASIVE CV LAB;  Service: Cardiovascular;  Laterality: N/A;   CORONARY ANGIOPLASTY WITH STENT PLACEMENT  2006   in [place] , occluded CFX, s/p Taxus stent   CORONARY ANGIOPLASTY WITH STENT PLACEMENT  2007   in [place] , ostial RCA stent   CORONARY ANGIOPLASTY WITH STENT PLACEMENT  2015   in [place] , RCA stent   CORONARY PRESSURE/FFR STUDY N/A 10/02/2023   Procedure: CORONARY PRESSURE/FFR STUDY;  Surgeon: Michael Lurena POUR, MD;  Location: [facility] INVASIVE CV LAB;  Service: Cardiovascular;  Laterality: N/A;   LEFT HEART CATH AND CORONARY ANGIOGRAPHY N/A 10/02/2023   Procedure: LEFT HEART CATH AND CORONARY ANGIOGRAPHY;  Surgeon: Michael Lurena POUR, MD;  Location: [facility] INVASIVE CV LAB;  Service: Cardiovascular;  Laterality: N/A;   TONSILLECTOMY AND ADENOIDECTOMY        reports that he has never smoked. He has never used smokeless tobacco. He reports that he does not drink alcohol and does not use drugs.  No Known Allergies  Family History  Problem Relation Age of Onset   Hypertension Father    Dementia Father  Vascular. patient states dementia/possible alzheimers as well   Hyperlipidemia Sister    Cancer Brother        type unknown   Cancer Brother        type unknown   Heart disease Maternal Grandfather 64       Died probably of heart diseasse   Heart disease Maternal Uncle 46       Died of heart exploding   Cerebral palsy Daughter    Stroke Son        severe problems with blood clotting   Colon cancer Neg Hx    Esophageal cancer Neg  Hx    Pancreatic cancer Neg Hx    Stomach cancer Neg Hx    Liver disease Neg Hx     Prior to Admission medications   Medication Sig Start Date End Date Taking? Authorizing Provider  aspirin  81 MG tablet Take 81 mg by mouth daily.    [provider]  atorvastatin  (LIPITOR ) 80 MG tablet TAKE 1 TABLET DAILY 10/21/23   Lavona Agent, MD  CHERRY PO Take 50 mg by mouth daily. Cherry extract    [provider]  Cinnamon 500 MG capsule Take 500 mg by mouth daily.    [provider]  clotrimazole -betamethasone  (LOTRISONE ) cream Apply 1 Application topically 2 (two) times daily. 04/13/24   [name], Michael J, NP  Coenzyme Q10 (Q-SORB CO Q-10) 100 MG capsule Take 1 capsule by mouth daily.    [provider]  famotidine  (PEPCID ) 40 MG tablet Take 1 tablet (40 mg total) by mouth daily. 07/15/22   [name], MD  hydrocortisone  (ANUSOL -HC) 2.5 % rectal cream Place rectally 2 (two) times daily. Patient needs office visit. 03/31/24   [name], Michael D, PA-C  isosorbide  mononitrate (IMDUR ) 60 MG 24 hr tablet TAKE 1 TABLET DAILY 10/20/23   Lavona Agent, MD  nitroGLYCERIN  (NITROSTAT ) 0.4 MG SL tablet DISSOLVE 1 TABLET UNDER THE TONGUE EVERY 5 MINUTES AS NEEDED FOR CHEST PAIN UP TO 3 DOSES 11/28/19   Lavona Agent, MD  ranolazine  (RANEXA ) 500 MG 12 hr tablet Take 1 tablet (500 mg total) by mouth 2 (two) times daily. 10/03/23   Michael Thom CROME, PA-C  ticagrelor  (BRILINTA ) 60 MG TABS tablet Take 1 tablet (60 mg total) by mouth 2 (two) times daily. 06/22/23   Lavona Agent, MD  Turmeric 500 MG CAPS Take 500 mg by mouth daily.    [provider]    Physical Exam: Vitals:   04/20/24 2015 04/20/24 2145 04/20/24 2233 04/21/24 0000  BP: (!) 149/73 (!) 146/75 (!) 157/79   Pulse: 68 66 61   Resp: 10 18 20    Temp:   (!) 97.5 F (36.4 C)   TempSrc:   Oral   SpO2: 99% 98% 99%   Weight:    87.1 kg  Height:    5' 6 (1.676 m)    Physical Exam Vitals reviewed.   Constitutional:      General: He is not in acute distress. HENT:     Head: Normocephalic and atraumatic.  Eyes:     Extraocular Movements: Extraocular movements intact.  Cardiovascular:     Rate and Rhythm: Normal rate and regular rhythm.     Heart sounds: Normal heart sounds.  Pulmonary:     Effort: Pulmonary effort is normal. No respiratory distress.     Breath sounds: Normal breath sounds.  Abdominal:     General: Bowel sounds are normal. There is no distension.  Palpations: Abdomen is soft.     Tenderness: There is no abdominal tenderness. There is no guarding.  Musculoskeletal:     Cervical back: Normal range of motion.     Right lower leg: No edema.     Left lower leg: No edema.  Skin:    General: Skin is warm and dry.  Neurological:     General: No focal deficit present.     Mental Status: He is alert and oriented to person, place, and time.     Cranial Nerves: No cranial nerve deficit.     Sensory: No sensory deficit.     Motor: No weakness.     Labs on Admission: I have personally reviewed following labs and imaging studies  CBC: Recent Labs  Lab 04/20/24 1818  WBC 6.7  NEUTROABS 4.4  HGB 14.8  HCT 42.0  MCV 92.3  PLT 243   Basic Metabolic Panel: Recent Labs  Lab 04/20/24 1818  NA 141  K 4.0  CL 102  CO2 27  GLUCOSE 109*  BUN 23  CREATININE 1.18  CALCIUM  9.3   GFR: Estimated Creatinine Clearance: 61.1 mL/min (by C-G formula based on SCr of 1.18 mg/dL). Liver Function Tests: Recent Labs  Lab 04/20/24 1818  AST 21  ALT 14  ALKPHOS 78  BILITOT 0.9  PROT 6.6  ALBUMIN 4.1   No results for input(s): LIPASE, AMYLASE in the last 168 hours. No results for input(s): AMMONIA in the last 168 hours. Coagulation Profile: Recent Labs  Lab 04/20/24 1818  INR 1.0   Cardiac Enzymes: No results for input(s): CKTOTAL, CKMB, CKMBINDEX, TROPONINI in the last 168 hours. BNP (last 3 results) No results for input(s): PROBNP in the  last 8760 hours. HbA1C: No results for input(s): HGBA1C in the last 72 hours. CBG: Recent Labs  Lab 04/20/24 1746  GLUCAP 103*   Lipid Profile: No results for input(s): CHOL, HDL, LDLCALC, TRIG, CHOLHDL, LDLDIRECT in the last 72 hours. Thyroid  Function Tests: No results for input(s): TSH, T4TOTAL, FREET4, T3FREE, THYROIDAB in the last 72 hours. Anemia Panel: No results for input(s): VITAMINB12, FOLATE, FERRITIN, TIBC, IRON, RETICCTPCT in the last 72 hours. Urine analysis: No results found for: COLORURINE, APPEARANCEUR, LABSPEC, PHURINE, GLUCOSEU, HGBUR, BILIRUBINUR, KETONESUR, PROTEINUR, UROBILINOGEN, NITRITE, LEUKOCYTESUR  Radiological Exams on Admission: CT Angio Head Neck W WO CM Result Date: 04/20/2024 CLINICAL DATA:  Vision loss, monocular EXAM: CT ANGIOGRAPHY HEAD AND NECK WITH AND WITHOUT CONTRAST TECHNIQUE: Multidetector CT imaging of the head and neck was performed using the standard protocol during bolus administration of intravenous contrast. Multiplanar CT image reconstructions and MIPs were obtained to evaluate the vascular anatomy. Carotid stenosis measurements (when applicable) are obtained utilizing NASCET criteria, using the distal internal carotid diameter as the denominator. RADIATION DOSE REDUCTION: This exam was performed according to the departmental dose-optimization program which includes automated exposure control, adjustment of the mA and/or kV according to patient size and/or use of iterative reconstruction technique. CONTRAST:  75mL OMNIPAQUE  IOHEXOL  350 MG/ML SOLN COMPARISON:  None Available. FINDINGS: CT HEAD FINDINGS Brain: Remote right occipital infarct. No evidence of acute large vascular territory infarct, acute hemorrhage, mass lesion or midline shift. Vascular: See below. Skull: No acute fracture. Sinuses/Orbits: Clear sinuses.  No acute orbital findings. Other: No mastoid effusions. Review of the MIP  images confirms the above findings CTA NECK FINDINGS Aortic arch: Great vessel origins are patent without proximal hemodynamically significant stenosis. Right carotid system: No evidence of dissection, stenosis (50% or greater), or  occlusion. Left carotid system: No evidence of dissection, stenosis (50% or greater), or occlusion. Vertebral arteries: Codominant. No evidence of dissection, stenosis (50% or greater), or occlusion. Skeleton: No evidence of acute fracture. Other neck: No evidence of acute abnormality. Upper chest: Visualized lung apices are clear. Review of the MIP images confirms the above findings CTA HEAD FINDINGS Anterior circulation: Bilateral intracranial ICAs, MCAs, and ACAs are patent without proximal hemodynamically significant stenosis. No aneurysm identified. Posterior circulation: Bilateral intradural vertebral arteries, basilar artery and bilateral posterior cerebral arteries are patent without proximal hemodynamically significant stenosis. No aneurysm identified. Venous sinuses: As permitted by contrast timing, patent. Review of the MIP images confirms the above findings IMPRESSION: 1. No large vessel occlusion or proximal hemodynamically significant stenosis. 2. Remote right occipital infarct. Electronically Signed   By: Gilmore GORMAN Molt M.D.   On: 04/20/2024 19:23    EKG: Independently reviewed.  Sinus rhythm, nonspecific T wave abnormalities.  No significant change compared to previous EKGs.  Assessment and Plan  Transient unilateral left eye vision loss ESR and CRP normal.  CTA head and neck showing no large vessel occlusion or proximal hemodynamically significant stenosis.  Showing remote right occipital infarct.  Patient is already on DAPT (aspirin  and Brilinta ) due to history of CAD with multiple stents.  Neurology recommended admission for TIA workup. -Neurology notified about patient's arrival to [facility] -Telemetry monitoring -MRI of the brain without  contrast -Echocardiogram -Hemoglobin A1c, fasting lipid panel -Frequent neurochecks -PT, OT, speech therapy. -Patient has passed bedside swallow screen and diet ordered.  CAD with multiple stents Patient is not endorsing anginal symptoms and EKG without acute ischemic changes.  Continue aspirin  and Brilinta  after pharmacy med rec is done.  Ischemic cardiomyopathy Last echo done in January 2025 showing EF 45 to 50%, grade 1 diastolic dysfunction, trivial mitral regurgitation.  No signs of volume overload at this time.  Hypertension Allowing permissive hypertension until brain MRI is done.  Hyperlipidemia GERD Pharmacy med rec pending.  DVT prophylaxis: Lovenox  Code Status: Full Code (discussed with the patient) Family Communication: No family available at this time. Consults called: Neurology Level of care: Telemetry bed Admission status: It is my clinical opinion that referral for OBSERVATION is reasonable and necessary in this patient based on the above information provided. The aforementioned taken together are felt to place the patient at high risk for further clinical deterioration. However, it is anticipated that the patient [name] be medically stable for discharge from the hospital within 24 to 48 hours.  Editha Ram MD [facility]  If 7PM-7AM, please contact night-coverage [email or web address]  04/21/2024, 1:35 AM

## 2024-04-22 ENCOUNTER — Inpatient Hospital Stay (HOSPITAL_BASED_OUTPATIENT_CLINIC_OR_DEPARTMENT_OTHER)
Admit: 2024-04-22 | Discharge: 2024-04-22 | Disposition: A | Discharge status: Home / Self Care | Attending: Cardiology | Admitting: Cardiology

## 2024-04-22 DIAGNOSIS — H5462 Unqualified visual loss, left eye, normal vision right eye: Secondary | ICD-10-CM | POA: Diagnosis not present

## 2024-04-22 DIAGNOSIS — H53122 Transient visual loss, left eye: Secondary | ICD-10-CM | POA: Diagnosis not present

## 2024-04-22 DIAGNOSIS — R29701 NIHSS score 1: Secondary | ICD-10-CM | POA: Diagnosis not present

## 2024-04-22 DIAGNOSIS — I639 Cerebral infarction, unspecified: Secondary | ICD-10-CM

## 2024-04-22 DIAGNOSIS — E785 Hyperlipidemia, unspecified: Secondary | ICD-10-CM

## 2024-04-22 DIAGNOSIS — I63431 Cerebral infarction due to embolism of right posterior cerebral artery: Secondary | ICD-10-CM | POA: Diagnosis not present

## 2024-04-22 DIAGNOSIS — Z7902 Long term (current) use of antithrombotics/antiplatelets: Secondary | ICD-10-CM | POA: Diagnosis not present

## 2024-04-22 DIAGNOSIS — Z7982 Long term (current) use of aspirin: Secondary | ICD-10-CM

## 2024-04-22 LAB — BASIC METABOLIC PANEL WITH GFR
Anion gap: 9 (ref 5–15)
BUN: 16 mg/dL (ref 8–23)
CO2: 27 mmol/L (ref 22–32)
Calcium: 9 mg/dL (ref 8.9–10.3)
Chloride: 103 mmol/L (ref 98–111)
Creatinine, Ser: 1.11 mg/dL (ref 0.61–1.24)
GFR, Estimated: >60 mL/min (ref >60)
Glucose, Bld: 116 mg/dL — ABNORMAL HIGH (ref 70–99)
Potassium: 4.1 mmol/L (ref 3.5–5.1)
Sodium: 139 mmol/L (ref 135–145)

## 2024-04-22 LAB — CBC WITH DIFFERENTIAL/PLATELET
Abs Immature Granulocytes: 0.02 K/uL (ref 0.00–0.07)
Basophils Absolute: 0 K/uL (ref 0.0–0.1)
Basophils Relative: 0 %
Eosinophils Absolute: 0.1 K/uL (ref 0.0–0.5)
Eosinophils Relative: 1 %
HCT: 39 % (ref 39.0–52.0)
Hemoglobin: 13.6 g/dL (ref 13.0–17.0)
Immature Granulocytes: 0 %
Lymphocytes Relative: 19 %
Lymphs Abs: 1.3 K/uL (ref 0.7–4.0)
MCH: 32.3 pg (ref 26.0–34.0)
MCHC: 34.9 g/dL (ref 30.0–36.0)
MCV: 92.6 fL (ref 80.0–100.0)
Monocytes Absolute: 0.6 K/uL (ref 0.1–1.0)
Monocytes Relative: 8 %
Neutro Abs: 4.9 K/uL (ref 1.7–7.7)
Neutrophils Relative %: 72 %
Platelets: 235 K/uL (ref 150–400)
RBC: 4.21 MIL/uL — ABNORMAL LOW (ref 4.22–5.81)
RDW: 12.4 % (ref 11.5–15.5)
WBC: 6.8 K/uL (ref 4.0–10.5)
nRBC: 0 % (ref 0.0–0.2)

## 2024-04-22 MED ORDER — EZETIMIBE 10 MG PO TABS: 10.0000 mg | ORAL_TABLET | Freq: Every day | ORAL | 11 refills | Status: AC

## 2024-04-22 NOTE — Discharge Summary (Signed)
 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 and needs either outpatient neurology and/or optometry follow-up 4 to 6 weeks to determine feasibility of driving-this has been explained to him and his family Zetia added-suggest outpatient follow-up with cardiology given the fact that he is already on DAPT and requires continuation of this-will require A1c and lipid panel in a several months Consideration can be given for PCSK9 inhibitor if he is interested with lipid allergy  Discharge Diagnoses:  MAIN problem for hospitalization   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 Loop recorder placed  Please see below for itemized issues addressed in HOpsital- refer to other progress notes for clarity if needed  Discharge Condition: Improved but cannot drive  Diet recommendation: Heart healthy  Filed Weights   04/21/24 0000  Weight: 87.1 kg    History of present illness:     68 year old white male Prior CAD previous stenting 2017-Myoview  2020 intermediate risk, prior HFrEF with improvement unstable angina 1/9-1/11 admission status post LHC + microvascular disease medically managed Multiple orthopedic concerns including partial nontraumatic right rotator cuff tear followed by Dr. Christyne Sharps Personal history of adenomatous colonic polyps-reflux Cervicalgia previously followed by PT   7/30 unilateral left eye monocular visual deficit?  Amaurosis NIH is 0 on arrival with EMS Seen by neurology who recommended inpatient workup MRI brain stroke team to follow and full stroke workup as could not rule out TIA     Plan   Acute right occipital lobe infarct with predominant visual disturbance and  quadrantanopsia Previous asymptomatic occipital stroke on the past-likely secondary to A-fib Should not drive until cleared by neurology cleared by therapy services eventually for discharge Declined study as well as ILR 30-day monitor recommended Resume Lipitor  80 as LDL is 121 HDL is 51, aspirin  81 and continue Brilinta  given no large vessel occlusion or risk of hemorrhage Added Zetia 10 this hospital stay   Mildly impaired glucose tolerance A1c 5.4 Diet exercise etc. as outpatient   Underlying extensive CAD history HFrEF with improvement Resumed prior GDMT at discharge as per below   Cervicalgia Seems to be stable not on significant pain meds See above discussion   Rotator cuff tear Outpatient follow-up  Discharge Exam: Vitals:   04/22/24 0730 04/22/24 1126  BP: 97/60 104/62  Pulse: 78 81  Resp:    Temp: 97.7 F (36.5 C) 98 F (36.7 C)  SpO2: 95% 96%    Subj on day of d/c   Awake coherent no distress little upset at not being able to drive  General Exam on discharge  EOMI NCAT no focal deficit to power although he has a little bit of nasal quadrantanopsia S1-S2 seems to be sinus on monitors Chest is clear no wheeze rales rhonchi ROM is intact Abdomen soft no rebound no guarding No lower extremity edema  Discharge Instructions   Discharge Instructions     Diet - low sodium heart healthy   Complete by: As directed    Discharge instructions   Complete by: As directed    Please take your aspirin  and Brilinta  ongoing and follow-up with both your cardiologist as well as the neurologist who saw you for secondary prevention of stroke We have added Zetia which is a medication that helps lower your cholesterol  and mitigate your risk Please pick up the 30-day monitor   Increase activity slowly   Complete by: As directed       Allergies as of 04/22/2024   No Known Allergies      Medication List     TAKE these medications    aspirin  81 MG tablet Take 81  mg by mouth daily.   atorvastatin  80 MG tablet Commonly known as: LIPITOR  TAKE 1 TABLET DAILY   ezetimibe 10 MG tablet Commonly known as: Zetia Take 1 tablet (10 mg total) by mouth daily.   famotidine  40 MG tablet Commonly known as: PEPCID  Take 1 tablet (40 mg total) by mouth daily. What changed:  when to take this reasons to take this   isosorbide  mononitrate 60 MG 24 hr tablet Commonly known as: IMDUR  TAKE 1 TABLET DAILY   Q-Sorb Co Q-10 100 MG capsule Generic drug: Coenzyme Q10 Take 1 capsule by mouth daily.   ranolazine  500 MG 12 hr tablet Commonly known as: Ranexa  Take 1 tablet (500 mg total) by mouth 2 (two) times daily.   ticagrelor  60 MG Tabs tablet Commonly known as: Brilinta  Take 1 tablet (60 mg total) by mouth 2 (two) times daily.       No Known Allergies    The results of significant diagnostics from this hospitalization (including imaging, microbiology, ancillary and laboratory) are listed below for reference.    Significant Diagnostic Studies: MR BRAIN WO CONTRAST Result Date: 04/21/2024 EXAM: MRI BRAIN WITHOUT CONTRAST 04/21/2024 07:49:04 PM TECHNIQUE: Multiplanar multisequence MRI of the head/brain was performed without the administration of intravenous contrast. COMPARISON: CTA head and neck dated 04/20/2024. CLINICAL HISTORY: Transient ischemic attack (TIA). Vision loss. FINDINGS: BRAIN AND VENTRICLES: There is an 8 mm focus of restricted diffusion within the inferior right occipital cortex compatible with acute infarct. Additional remote infarct in the medial right occipital lobe. Mild T2/FLAIR hyperintensity in the periventricular and subcortical white matter compatible with mild chronic microvascular ischemic changes. No intracranial hemorrhage. No mass. No midline shift. No hydrocephalus. The sella is unremarkable. Normal flow voids. ORBITS: No acute abnormality. SINUSES AND MASTOIDS: There is mild mucosal thickening in the inferior left maxillary  sinus. No acute abnormality in the remaining sinuses and mastoids. BONES AND SOFT TISSUES: Normal marrow signal. No acute soft tissue abnormality. IMPRESSION: 1. Acute infarct in the inferior right occipital cortex. 2. Remote infarct in the medial right occipital lobe. 3. Mild chronic microvascular ischemic changes. Electronically signed by: Donnice Mania MD 04/21/2024 08:42 PM EDT RP Workstation: [id number]   ECHOCARDIOGRAM COMPLETE Result Date: 04/21/2024    ECHOCARDIOGRAM REPORT   Patient Name:   JOAN AVETISYAN Date of Exam: 04/21/2024 Medical Rec #:  [id number]                 Height:       66.0 in Accession #:    [id number]                Weight:       192.0 lb Date of Birth:  07-06-1956                 BSA:          1.965 m Patient Age:    68 years                  BP:           133/74 mmHg Patient Gender: M  HR:           72 bpm. Exam Location:  Inpatient Procedure: 2D Echo, 3D Echo, Cardiac Doppler, Color Doppler and Strain Analysis            (Both Spectral and Color Flow Doppler were utilized during            procedure). Indications:    TIA  History:        Patient has prior history of Echocardiogram examinations, most                 recent 10/02/2023. CAD; Risk Factors:Hypertension and                 Dyslipidemia.  Sonographer:    Philomena Daring Referring Phys: [id number] VASUNDHRA RATHORE IMPRESSIONS  1. Left ventricular ejection fraction, by estimation, is 55 to 60%. The left ventricle has normal function. The left ventricle has no regional wall motion abnormalities. Left ventricular diastolic parameters are consistent with Grade I diastolic dysfunction (impaired relaxation).  2. Right ventricular systolic function is normal. The right ventricular size is normal.  3. The mitral valve is normal in structure. Trivial mitral valve regurgitation. No evidence of mitral stenosis.  4. The aortic valve is tricuspid. There is mild calcification of the aortic valve. Aortic valve  regurgitation is not visualized. Aortic valve sclerosis/calcification is present, without any evidence of aortic stenosis.  5. Agitated saline contrast bubble study was negative, with no evidence of any interatrial shunt. FINDINGS  Left Ventricle: Left ventricular ejection fraction, by estimation, is 55 to 60%. The left ventricle has normal function. The left ventricle has no regional wall motion abnormalities. Strain was performed and the global longitudinal strain is indeterminate. The left ventricular internal cavity size was normal in size. There is no left ventricular hypertrophy. Left ventricular diastolic parameters are consistent with Grade I diastolic dysfunction (impaired relaxation). Right Ventricle: The right ventricular size is normal. No increase in right ventricular wall thickness. Right ventricular systolic function is normal. Left Atrium: Left atrial size was normal in size. Right Atrium: Right atrial size was normal in size. Pericardium: There is no evidence of pericardial effusion. Mitral Valve: The mitral valve is normal in structure. Trivial mitral valve regurgitation. No evidence of mitral valve stenosis. Tricuspid Valve: The tricuspid valve is normal in structure. Tricuspid valve regurgitation is trivial. No evidence of tricuspid stenosis. Aortic Valve: The aortic valve is tricuspid. There is mild calcification of the aortic valve. Aortic valve regurgitation is not visualized. Aortic valve sclerosis/calcification is present, without any evidence of aortic stenosis. Pulmonic Valve: The pulmonic valve was normal in structure. Pulmonic valve regurgitation is trivial. No evidence of pulmonic stenosis. Aorta: The aortic root is normal in size and structure. Venous: The inferior vena cava was not well visualized. IAS/Shunts: No atrial level shunt detected by color flow Doppler. Agitated saline contrast was given intravenously to evaluate for intracardiac shunting. Agitated saline contrast bubble  study was negative, with no evidence of any interatrial shunt.  LEFT VENTRICLE PLAX 2D LVIDd:         4.75 cm      Diastology LVIDs:         3.98 cm      LV e' medial:    6.09 cm/s LV PW:         1.05 cm      LV E/e' medial:  10.1 LV IVS:        1.03 cm      LV  e' lateral:   7.40 cm/s LVOT diam:     2.12 cm      LV E/e' lateral: 8.3 LV SV:         74 LV SV Index:   38 LVOT Area:     3.53 cm  LV Volumes (MOD) LV vol d, MOD A2C: 81.0 ml LV vol d, MOD A4C: 109.0 ml LV vol s, MOD A2C: 39.8 ml LV vol s, MOD A4C: 56.0 ml LV SV MOD A2C:     41.2 ml LV SV MOD A4C:     109.0 ml LV SV MOD BP:      49.1 ml RIGHT VENTRICLE RV S prime:     10.70 cm/s TAPSE (M-mode): 1.8 cm LEFT ATRIUM             Index        RIGHT ATRIUM           Index LA diam:        3.04 cm 1.55 cm/m   RA Area:     12.30 cm LA Vol (A2C):   38.7 ml 19.69 ml/m  RA Volume:   26.00 ml  13.23 ml/m LA Vol (A4C):   36.3 ml 18.47 ml/m LA Biplane Vol: 38.7 ml 19.69 ml/m  AORTIC VALVE LVOT Vmax:   93.80 cm/s LVOT Vmean:  64.500 cm/s LVOT VTI:    0.210 m  AORTA Ao Root diam: 2.52 cm Ao Asc diam:  3.02 cm MITRAL VALVE MV Area (PHT): 3.42 cm     SHUNTS MV Decel Time: 222 msec     Systemic VTI:  0.21 m MV E velocity: 61.30 cm/s   Systemic Diam: 2.12 cm MV A velocity: 115.00 cm/s MV E/A ratio:  0.53 Toribio Fuel MD Electronically signed by Toribio Fuel MD Signature Date/Time: 04/21/2024/10:13:45 AM    Final    CT Angio Head Neck W WO CM Result Date: 04/20/2024 CLINICAL DATA:  Vision loss, monocular EXAM: CT ANGIOGRAPHY HEAD AND NECK WITH AND WITHOUT CONTRAST TECHNIQUE: Multidetector CT imaging of the head and neck was performed using the standard protocol during bolus administration of intravenous contrast. Multiplanar CT image reconstructions and MIPs were obtained to evaluate the vascular anatomy. Carotid stenosis measurements (when applicable) are obtained utilizing NASCET criteria, using the distal internal carotid diameter as the denominator.  RADIATION DOSE REDUCTION: This exam was performed according to the departmental dose-optimization program which includes automated exposure control, adjustment of the mA and/or kV according to patient size and/or use of iterative reconstruction technique. CONTRAST:  75mL OMNIPAQUE  IOHEXOL  350 MG/ML SOLN COMPARISON:  None Available. FINDINGS: CT HEAD FINDINGS Brain: Remote right occipital infarct. No evidence of acute large vascular territory infarct, acute hemorrhage, mass lesion or midline shift. Vascular: See below. Skull: No acute fracture. Sinuses/Orbits: Clear sinuses.  No acute orbital findings. Other: No mastoid effusions. Review of the MIP images confirms the above findings CTA NECK FINDINGS Aortic arch: Great vessel origins are patent without proximal hemodynamically significant stenosis. Right carotid system: No evidence of dissection, stenosis (50% or greater), or occlusion. Left carotid system: No evidence of dissection, stenosis (50% or greater), or occlusion. Vertebral arteries: Codominant. No evidence of dissection, stenosis (50% or greater), or occlusion. Skeleton: No evidence of acute fracture. Other neck: No evidence of acute abnormality. Upper chest: Visualized lung apices are clear. Review of the MIP images confirms the above findings CTA HEAD FINDINGS Anterior circulation: Bilateral intracranial ICAs, MCAs, and ACAs are patent without proximal hemodynamically significant stenosis. No  aneurysm identified. Posterior circulation: Bilateral intradural vertebral arteries, basilar artery and bilateral posterior cerebral arteries are patent without proximal hemodynamically significant stenosis. No aneurysm identified. Venous sinuses: As permitted by contrast timing, patent. Review of the MIP images confirms the above findings IMPRESSION: 1. No large vessel occlusion or proximal hemodynamically significant stenosis. 2. Remote right occipital infarct. Electronically Signed   By: Gilmore GORMAN Molt M.D.    On: 04/20/2024 19:23    Microbiology: No results found for this or any previous visit (from the past 240 hours).   Labs: Basic Metabolic Panel: Recent Labs  Lab 04/20/24 1818 04/21/24 0257 04/22/24 0315  NA 141  --  139  K 4.0  --  4.1  CL 102  --  103  CO2 27  --  27  GLUCOSE 109*  --  116*  BUN 23  --  16  CREATININE 1.18 1.00 1.11  CALCIUM  9.3  --  9.0   Liver Function Tests: Recent Labs  Lab 04/20/24 1818  AST 21  ALT 14  ALKPHOS 78  BILITOT 0.9  PROT 6.6  ALBUMIN 4.1   No results for input(s): LIPASE, AMYLASE in the last 168 hours. No results for input(s): AMMONIA in the last 168 hours. CBC: Recent Labs  Lab 04/20/24 1818 04/21/24 0257 04/22/24 0315  WBC 6.7 8.3 6.8  NEUTROABS 4.4  --  4.9  HGB 14.8 14.7 13.6  HCT 42.0 41.6 39.0  MCV 92.3 91.8 92.6  PLT 243 245 235   Cardiac Enzymes: No results for input(s): CKTOTAL, CKMB, CKMBINDEX, TROPONINI in the last 168 hours. BNP: BNP (last 3 results) No results for input(s): BNP in the last 8760 hours.  ProBNP (last 3 results) No results for input(s): PROBNP in the last 8760 hours.  CBG: Recent Labs  Lab 04/20/24 1746  GLUCAP 103*    Signed:  Colen Grimes MD   [facility] 04/22/2024, 3:01 PM

## 2024-04-22 NOTE — Progress Notes (Signed)
 SLP Cancellation Note  Patient Details Name: Duc Crocket MRN: [id number] DOB: 1956/06/26   Cancelled treatment:        MRI with findings of R occipital lobe CVA.  SLP attempted x3 to see for need for further assessment.  At time of first attempt, pt had not yet been informed of results.  At second attempt, pt was with another provider.  Re-attmpted approximately 30 minutes later and pt had discharged.   Donalda Job E Marvin Grabill 04/22/2024, 4:03 PM

## 2024-04-22 NOTE — Progress Notes (Signed)
 DISCHARGE NOTE HOME Tye Vigo Dix to be discharged Home per MD order. Discussed prescriptions and follow up appointments with the patient. Prescriptions given to patient; medication list explained in detail. Patient verbalized understanding.  Skin clean, dry and intact without evidence of skin break down, no evidence of skin tears noted. IV catheter discontinued intact. Site without signs and symptoms of complications. Dressing and pressure applied. Pt denies pain at the site currently. No complaints noted.  Patient free of lines, drains, and wounds.   An After Visit Summary (AVS) was printed and given to the patient. Patient escorted via wheelchair, and discharged home via private auto.  Peyton SHAUNNA Pepper, RN

## 2024-04-22 NOTE — Progress Notes (Addendum)
 STROKE TEAM PROGRESS NOTE    INTERIM HISTORY/SUBJECTIVE Patient presented with sudden onset of left eye vision loss mostly towards the periphery he feels it has come back but is not sure if it is back to normal. Patient remains hemodynamically stable and afebrile.  He continues to have a visual field deficit in the left upper outer quadrant. MRI scan confirms right occipital PCA branch infarct as well as well as an old right occipital branch infarct with slightly different location.  CT angiogram shows no lateral stenosis or occlusion. OBJECTIVE  CBC    Component Value Date/Time   WBC 6.8 04/22/2024 0315   RBC 4.21 (L) 04/22/2024 0315   HGB 13.6 04/22/2024 0315   HCT 39.0 04/22/2024 0315   PLT 235 04/22/2024 0315   MCV 92.6 04/22/2024 0315   MCH 32.3 04/22/2024 0315   MCHC 34.9 04/22/2024 0315   RDW 12.4 04/22/2024 0315   LYMPHSABS 1.3 04/22/2024 0315   MONOABS 0.6 04/22/2024 0315   EOSABS 0.1 04/22/2024 0315   BASOSABS 0.0 04/22/2024 0315    BMET    Component Value Date/Time   NA 139 04/22/2024 0315   K 4.1 04/22/2024 0315   CL 103 04/22/2024 0315   CO2 27 04/22/2024 0315   GLUCOSE 116 (H) 04/22/2024 0315   BUN 16 04/22/2024 0315   CREATININE 1.11 04/22/2024 0315   CREATININE 0.96 08/03/2020 1448   CALCIUM  9.0 04/22/2024 0315   GFRNONAA >60 04/22/2024 0315   GFRNONAA 83 08/03/2020 1448    IMAGING past 24 hours MR BRAIN WO CONTRAST Result Date: 04/21/2024 EXAM: MRI BRAIN WITHOUT CONTRAST 04/21/2024 07:49:04 PM TECHNIQUE: Multiplanar multisequence MRI of the head/brain was performed without the administration of intravenous contrast. COMPARISON: CTA head and neck dated 04/20/2024. CLINICAL HISTORY: Transient ischemic attack (TIA). Vision loss. FINDINGS: BRAIN AND VENTRICLES: There is an 8 mm focus of restricted diffusion within the inferior right occipital cortex compatible with acute infarct. Additional remote infarct in the medial right occipital lobe. Mild T2/FLAIR  hyperintensity in the periventricular and subcortical white matter compatible with mild chronic microvascular ischemic changes. No intracranial hemorrhage. No mass. No midline shift. No hydrocephalus. The sella is unremarkable. Normal flow voids. ORBITS: No acute abnormality. SINUSES AND MASTOIDS: There is mild mucosal thickening in the inferior left maxillary sinus. No acute abnormality in the remaining sinuses and mastoids. BONES AND SOFT TISSUES: Normal marrow signal. No acute soft tissue abnormality. IMPRESSION: 1. Acute infarct in the inferior right occipital cortex. 2. Remote infarct in the medial right occipital lobe. 3. Mild chronic microvascular ischemic changes. Electronically signed by: Donnice Mania MD 04/21/2024 08:42 PM EDT RP Workstation: [id number]    Vitals:   04/21/24 2346 04/22/24 0410 04/22/24 0730 04/22/24 1126  BP: 113/66 93/62 97/60  104/62  Pulse: 67 68 78 81  Resp: 17 18    Temp: 98.4 F (36.9 C) 98.1 F (36.7 C) 97.7 F (36.5 C) 98 F (36.7 C)  TempSrc:      SpO2: 96% 95% 95% 96%  Weight:      Height:         PHYSICAL EXAM General:  Alert, well-nourished, well-developed patient in no acute distress Psych:  Mood and affect appropriate for situation CV: Regular rate and rhythm on monitor Respiratory:  Regular, unlabored respirations on room air   NEURO:  Mental Status: AA&Ox3, patient is able to give clear and coherent history Speech/Language: speech is without dysarthria or aphasia.    Cranial Nerves:  II: PERRL.  Left upper  outer quadrantanopsia, seen in bilateral eyes separately and both together III, IV, VI: EOMI. Eyelids elevate symmetrically.  VII: Face is symmetrical resting and smiling VIII: hearing intact to voice. IX, X: Phonation is normal.  KP:Dynloizm shrug 5/5. XII: tongue is midline without fasciculations. Motor: Able to move all 4 extremities with good antigravity strength Tone: is normal and bulk is normal Sensation- Intact to light  touch bilaterally.  Coordination: FTN intact bilaterally Gait- deferred  Most Recent NIH  1a Level of Conscious.: 0 1b LOC Questions: 0 1c LOC Commands: 0 2 Best Gaze: 0 3 Visual: 1 4 Facial Palsy: 0 5a Motor Arm - left: 0 5b Motor Arm - Right: 0 6a Motor Leg - Left: 0 6b Motor Leg - Right: 0 7 Limb Ataxia: 0 8 Sensory: 0 9 Best Language: 0 10 Dysarthria: 0 11 Extinct. and Inatten.: 0 TOTAL: 1   ASSESSMENT/PLAN  Mr. Michael [name] is a 68 y.o. male with history of CAD, hypertension and hyperlipidemia admitted for loss of vision in the left visual field.  Patient reports that he lost complete vision in his left eye for about 40 minutes.  MRI revealed right occipital lobe infarct.  NIH on Admission 0  Acute Ischemic Infarct:  right occipital lobe infarct Etiology: Cryptogenic, potentially embolic CT head No acute abnormality.  Remote right occipital infarct CTA head & neck no LVO or hemodynamically significant stenosis MRI acute infarct in the inferior right occipital cortex, remote infarct in medial right occipital lobe, mild chronic microvascular ischemic changes 2D Echo EF 55 to 60%, grade 1 diastolic dysfunction, no interatrial shunt, normal left atrial size Loop recorder recommended to be placed prior to discharge but patient declined and chose to have 30-day heart monitor instead LDL 121 HgbA1c 5.4 VTE prophylaxis -Lovenox  aspirin  81 mg daily and Brilinta  (ticagrelor ) 90 mg bid prior to admission, now on aspirin  81 mg daily and Brilinta  (ticagrelor ) 90 mg bid, patient considered participation in [place] study but after reviewing informed consent form declined Therapy recommendations:  Pending Disposition: Pending, likely home  Hx of Stroke/TIA 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  He states he has been missing some doses of his atorvastatin .  Instructed patient to be sure that he takes this medication every day and have LDL rechecked.  If LDL is still high, could consider Leqvio or PCSK9 inhibitor at that point. Continue statin at discharge  Other Stroke Risk Factors Obesity, Body mass index is 30.99 kg/m., BMI >/= 30 associated with increased stroke risk, recommend weight loss, diet and exercise as appropriate  Coronary artery disease   Other Active Problems None  Hospital day # 0  Patient seen by NP with MD, MD to edit note as needed. Cortney E Everitt Clint Kill , MSN, AGACNP-BC [facility] See Amion for schedule and pager information 04/22/2024 12:07 PM   I have personally obtained history,examined this patient, reviewed notes, independently viewed imaging studies, participated in medical decision making and plan of care.ROS completed by me personally and pertinent positives fully documented  I have made any additions or clarifications directly to the above note. Agree with note above.  Patient presented with sudden onset of left eye peripheral vision loss due to embolic right PCA infarct.  Given significant history of coronary artery disease and  prior silent right occipital stroke strong suspicion of paroxysmal A-fib.  I strongly recommended loop recorder insertion but patient after discussion with electrophysiology team declined and instead prefers to have a 30-day heart monitor.  Continue aggressive risk factor modification.  Lipitor  80 mg daily and add Zetia 10 mg.  Consider evaluation for outpatient Leqvio or PCSK9 inhibitor if interested.  Patient advised not to drive till his peripheral vision loss improves.  Follow-up as an outpatient stroke clinic in 2 months.  Long discussion with patient and answered questions.  Discussed with Dr. Royal and with Dr. Inocencio   I personally spent a total of 50 minutes in the care of the  patient today including getting/reviewing separately obtained history, performing a medically appropriate exam/evaluation, counseling and educating, placing orders, referring and communicating with other health care professionals, documenting clinical information in the EHR, independently interpreting results, and coordinating care.         Eather Popp, MD Medical Director [facility] Pager: [phone] 04/22/2024 2:35 PM   To contact Stroke Continuity provider, please refer to [email or web address]. After hours, contact General Neurology

## 2024-04-22 NOTE — Consult Note (Addendum)
 ELECTROPHYSIOLOGY CONSULT NOTE  Patient ID: Michael [name] MRN: [id number], DOB/AGE: November 24, 1955   Admit date: 04/20/2024 Date of Consult: 04/22/2024  Primary Physician: [name], MD Primary Cardiologist: Lynwood Schilling, MD  Primary Electrophysiologist: New to None  Reason for Consultation: Cryptogenic stroke; recommendations regarding Implantable Loop Recorder Insurance: medicare  History of Present Illness EP has been asked to evaluate Michael [name] for placement of an implantable loop recorder to monitor for atrial fibrillation by Dr Michaela.  The patient was admitted on 04/20/2024 with transient L visual loss with mild headache.    Imaging demonstrated right occipital lobe infarct.    He has undergone workup for stroke including:  CT head No acute abnormality.  Remote right occipital infarct CTA head & neck no LVO or hemodynamically significant stenosis MRI acute infarct in the inferior right occipital cortex, remote infarct in medial right occipital lobe, mild chronic microvascular ischemic changes 2D Echo EF 55 to 60%, grade 1 diastolic dysfunction, no interatrial shunt, normal left atrial size Loop recorder to be placed prior to discharge LDL 121 HgbA1c 5.4 VTE prophylaxis -Lovenox  aspirin  81 mg daily and Brilinta  (ticagrelor ) 90 mg bid prior to admission, now on aspirin  81 mg daily and Brilinta  (ticagrelor ) 90 mg bid, considering participation in [place] study Therapy recommendations:  Pending Disposition: Pending, likely home   The patient has been monitored on telemetry which has demonstrated sinus rhythm with no arrhythmias.  Inpatient stroke work-up Kaesha Kirsch not require a TEE per Neurology.   Echocardiogram as above. Lab work is reviewed.  Prior to admission, the patient denies chest pain, shortness of breath, dizziness, palpitations, or syncope.  He is recovering from his stroke with plans to return home  at discharge.  Allergies,  Past Medical, Surgical, Social, and Family Histories have been reviewed and are referenced here-in when relevant for medical decision making.   Inpatient Medications:   aspirin   81 mg Oral Daily   atorvastatin   80 mg Oral Daily   enoxaparin  (LOVENOX ) injection  40 mg Subcutaneous Daily   famotidine   40 mg Oral Daily   isosorbide  mononitrate  30 mg Oral Daily   ranolazine   500 mg Oral BID   ticagrelor   60 mg Oral BID    Physical Exam: Vitals:   04/21/24 2346 04/22/24 0410 04/22/24 0730 04/22/24 1126  BP: 113/66 93/62 97/60  104/62  Pulse: 67 68 78 81  Resp: 17 18    Temp: 98.4 F (36.9 C) 98.1 F (36.7 C) 97.7 F (36.5 C) 98 F (36.7 C)  TempSrc:      SpO2: 96% 95% 95% 96%  Weight:      Height:        GEN- NAD. A&O x 3. Normal affect. HEENT: Normocephalic, atraumatic Lungs- CTAB, Normal effort.  Heart- Regular rate and rhythm rate and rhythm. No M/G/R.  Extremities- No peripheral edema. no clubbing or cyanosis Skin- warm and dry, no rash or lesion. Neuro - no deficts appreciated   12-lead ECG  (personally reviewed) All prior EKG's in EPIC reviewed with no documented atrial fibrillation  Telemetry SR (personally reviewed)  Assessment and Plan:  1. Cryptogenic stroke The patient presents with cryptogenic stroke.  The patient does not have a TEE planned for this AM.  I spoke at length with the patient about monitoring for afib with an implantable loop recorder.  Risks, benefits, and alteratives to implantable loop recorder were discussed with the patient today.   At this time, the patient refuses loop  consideration, and has opted to wear an event monitor. If the patient chooses to leave and be considered for outpatient loop recorder, the co-pay usually ranges from $0-250 depending on insurance. The patient or family can call insurance and provide CPT code 66714.     Please call with questions.     Suzann Riddle, NP 04/22/2024 12:57 PM  I have seen and examined this  patient with Suzann Riddle.  Agree with above, note added to reflect my findings.  Patient admitted to the hospital with cryptogenic stroke.  He was admitted 04/20/2024 with transient left-sided visual loss and mild headache.  No cause for stroke has been found.  Had an echo with a normal ejection fraction.  He is feeling well without acute complaints.  GEN: No acute distress.   Neck: No JVD Cardiac: Regular rhythm Respiratory: Normal work of breathing. GI: Soft, nontender, non-distended  MS: No edema; No deformity. Neuro:  Nonfocal  Skin: warm and dry Psych: Normal affect    Cryptogenic stroke: Patient has been offered ILR implant for monitoring for atrial fibrillation.  At this point, patient is not interested in the implant.  Plan for 30-day monitoring.  30-day monitoring is unrevealing, patient may be more willing for implantation.  Malaysha Arlen M. Serena Petterson MD 04/22/2024 2:33 PM

## 2024-04-25 NOTE — Progress Notes (Signed)
 ZIO AT applied at hospital  2 consecutive 14 day ZIO AT monitors requested. Message sent to [facility] to mail out 2nd monitor in time for 05/06/24 application.    Dr. Inocencio to read.

## 2024-04-26 NOTE — Patient Instructions (Incomplete)
 Schedule a lab visit at the check out desk in 6  weeks. Return for future fasting labs meaning nothing but water after midnight please. Ok to take your medications with water.  See eye doctor and continue to avoid driving until we get that evaluation  Follow-up as an outpatient stroke clinic in 2 months - if you have not heard in a month call [facility] for follow up   Stroke: You had a recent stroke in the right occipital lobe, which affected your vision. There was also an older stroke in a similar vision area. -Start using a 30-day event monitor to check for atrial fibrillation. -Continue taking aspirin  81 mg daily. -Continue taking Brilinta  as prescribed. -Consider an internal loop recorder if the 30-day monitor does not show any issues. - be consistent with statin and Zetia  for now- rechecking in 6 weeks  HYPERLIPIDEMIA: Your cholesterol levels were high during your hospital stay, which may be due to not taking your medication regularly. -Continue taking atorvastatin  80 mg daily. -Add Zetia  10 mg daily.  PREDIABETES: You have prediabetes, but your blood sugar levels improved during your hospital stay. -Continue to work on a healthy diet and exercise routine. Whole food plant based diet like mediterranean diet is very healthy  Recommended follow up: Return in about 6 months (around 10/28/2024) for followup or sooner if needed.Schedule b4 you leave.  Contains text generated by Abridge.                                 Contains text generated by Abridge.

## 2024-04-27 ENCOUNTER — Emergency Department (HOSPITAL_BASED_OUTPATIENT_CLINIC_OR_DEPARTMENT_OTHER)

## 2024-04-27 ENCOUNTER — Encounter (HOSPITAL_BASED_OUTPATIENT_CLINIC_OR_DEPARTMENT_OTHER): Payer: Self-pay | Admitting: Emergency Medicine

## 2024-04-27 ENCOUNTER — Encounter: Payer: Self-pay | Admitting: Family Medicine

## 2024-04-27 ENCOUNTER — Inpatient Hospital Stay (HOSPITAL_BASED_OUTPATIENT_CLINIC_OR_DEPARTMENT_OTHER)
Admission: EM | Admit: 2024-04-27 | Discharge: 2024-04-29 | DRG: 065 | Disposition: A | Discharge status: Home / Self Care | Attending: Internal Medicine | Admitting: Internal Medicine

## 2024-04-27 ENCOUNTER — Ambulatory Visit (INDEPENDENT_AMBULATORY_CARE_PROVIDER_SITE_OTHER): Payer: Medicare Other | Admitting: Family Medicine

## 2024-04-27 VITALS — BP 100/60 | HR 73 | Temp 97.4°F | Ht 66.0 in | Wt 193.2 lb

## 2024-04-27 DIAGNOSIS — I6350 Cerebral infarction due to unspecified occlusion or stenosis of unspecified cerebral artery: Secondary | ICD-10-CM | POA: Diagnosis not present

## 2024-04-27 DIAGNOSIS — I358 Other nonrheumatic aortic valve disorders: Secondary | ICD-10-CM | POA: Diagnosis present

## 2024-04-27 DIAGNOSIS — I10 Essential (primary) hypertension: Secondary | ICD-10-CM | POA: Diagnosis present

## 2024-04-27 DIAGNOSIS — E785 Hyperlipidemia, unspecified: Secondary | ICD-10-CM | POA: Diagnosis not present

## 2024-04-27 DIAGNOSIS — Z8249 Family history of ischemic heart disease and other diseases of the circulatory system: Secondary | ICD-10-CM

## 2024-04-27 DIAGNOSIS — R200 Anesthesia of skin: Secondary | ICD-10-CM | POA: Diagnosis not present

## 2024-04-27 DIAGNOSIS — E669 Obesity, unspecified: Secondary | ICD-10-CM | POA: Diagnosis present

## 2024-04-27 DIAGNOSIS — I2510 Atherosclerotic heart disease of native coronary artery without angina pectoris: Secondary | ICD-10-CM

## 2024-04-27 DIAGNOSIS — N400 Enlarged prostate without lower urinary tract symptoms: Secondary | ICD-10-CM | POA: Diagnosis present

## 2024-04-27 DIAGNOSIS — I639 Cerebral infarction, unspecified: Secondary | ICD-10-CM | POA: Diagnosis not present

## 2024-04-27 DIAGNOSIS — H5460 Unqualified visual loss, one eye, unspecified: Secondary | ICD-10-CM

## 2024-04-27 DIAGNOSIS — R4789 Other speech disturbances: Secondary | ICD-10-CM

## 2024-04-27 DIAGNOSIS — Z79899 Other long term (current) drug therapy: Secondary | ICD-10-CM

## 2024-04-27 DIAGNOSIS — Z6831 Body mass index (BMI) 31.0-31.9, adult: Secondary | ICD-10-CM

## 2024-04-27 DIAGNOSIS — Z8673 Personal history of transient ischemic attack (TIA), and cerebral infarction without residual deficits: Secondary | ICD-10-CM | POA: Diagnosis not present

## 2024-04-27 DIAGNOSIS — G8194 Hemiplegia, unspecified affecting left nondominant side: Secondary | ICD-10-CM | POA: Diagnosis present

## 2024-04-27 DIAGNOSIS — I255 Ischemic cardiomyopathy: Secondary | ICD-10-CM | POA: Diagnosis present

## 2024-04-27 DIAGNOSIS — Z9861 Coronary angioplasty status: Secondary | ICD-10-CM

## 2024-04-27 DIAGNOSIS — Z7982 Long term (current) use of aspirin: Secondary | ICD-10-CM

## 2024-04-27 DIAGNOSIS — R29703 NIHSS score 3: Secondary | ICD-10-CM | POA: Diagnosis present

## 2024-04-27 DIAGNOSIS — Z955 Presence of coronary angioplasty implant and graft: Secondary | ICD-10-CM

## 2024-04-27 DIAGNOSIS — E66811 Obesity, class 1: Secondary | ICD-10-CM | POA: Diagnosis present

## 2024-04-27 DIAGNOSIS — I5022 Chronic systolic (congestive) heart failure: Secondary | ICD-10-CM | POA: Diagnosis present

## 2024-04-27 DIAGNOSIS — K219 Gastro-esophageal reflux disease without esophagitis: Secondary | ICD-10-CM | POA: Diagnosis present

## 2024-04-27 DIAGNOSIS — Z809 Family history of malignant neoplasm, unspecified: Secondary | ICD-10-CM

## 2024-04-27 DIAGNOSIS — Z83438 Family history of other disorder of lipoprotein metabolism and other lipidemia: Secondary | ICD-10-CM

## 2024-04-27 DIAGNOSIS — R410 Disorientation, unspecified: Secondary | ICD-10-CM

## 2024-04-27 DIAGNOSIS — I110 Hypertensive heart disease with heart failure: Secondary | ICD-10-CM | POA: Diagnosis present

## 2024-04-27 DIAGNOSIS — R531 Weakness: Secondary | ICD-10-CM

## 2024-04-27 DIAGNOSIS — H5462 Unqualified visual loss, left eye, normal vision right eye: Secondary | ICD-10-CM | POA: Diagnosis present

## 2024-04-27 DIAGNOSIS — Z820 Family history of epilepsy and other diseases of the nervous system: Secondary | ICD-10-CM

## 2024-04-27 DIAGNOSIS — Z823 Family history of stroke: Secondary | ICD-10-CM

## 2024-04-27 DIAGNOSIS — Z7902 Long term (current) use of antithrombotics/antiplatelets: Secondary | ICD-10-CM

## 2024-04-27 DIAGNOSIS — I63433 Cerebral infarction due to embolism of bilateral posterior cerebral arteries: Secondary | ICD-10-CM | POA: Diagnosis not present

## 2024-04-27 DIAGNOSIS — I252 Old myocardial infarction: Secondary | ICD-10-CM

## 2024-04-27 DIAGNOSIS — H53462 Homonymous bilateral field defects, left side: Secondary | ICD-10-CM | POA: Diagnosis present

## 2024-04-27 LAB — COMPREHENSIVE METABOLIC PANEL WITH GFR
ALT: 29 U/L (ref 0–44)
AST: 29 U/L (ref 15–41)
Albumin: 3.9 g/dL (ref 3.5–5.0)
Alkaline Phosphatase: 84 U/L (ref 38–126)
Anion gap: 11 (ref 5–15)
BUN: 16 mg/dL (ref 8–23)
CO2: 26 mmol/L (ref 22–32)
Calcium: 9.1 mg/dL (ref 8.9–10.3)
Chloride: 103 mmol/L (ref 98–111)
Creatinine, Ser: 1.03 mg/dL (ref 0.61–1.24)
GFR, Estimated: >60 mL/min (ref >60)
Glucose, Bld: 77 mg/dL (ref 70–99)
Potassium: 3.5 mmol/L (ref 3.5–5.1)
Sodium: 140 mmol/L (ref 135–145)
Total Bilirubin: 1 mg/dL (ref 0.0–1.2)
Total Protein: 6.4 g/dL — ABNORMAL LOW (ref 6.5–8.1)

## 2024-04-27 LAB — CBC
HCT: 39.8 % (ref 39.0–52.0)
Hemoglobin: 13.9 g/dL (ref 13.0–17.0)
MCH: 32.3 pg (ref 26.0–34.0)
MCHC: 34.9 g/dL (ref 30.0–36.0)
MCV: 92.6 fL (ref 80.0–100.0)
Platelets: 255 K/uL (ref 150–400)
RBC: 4.3 MIL/uL (ref 4.22–5.81)
RDW: 12.7 % (ref 11.5–15.5)
WBC: 9.2 K/uL (ref 4.0–10.5)
nRBC: 0 % (ref 0.0–0.2)

## 2024-04-27 LAB — DIFFERENTIAL
Abs Immature Granulocytes: 0.02 K/uL (ref 0.00–0.07)
Basophils Absolute: 0 K/uL (ref 0.0–0.1)
Basophils Relative: 0 %
Eosinophils Absolute: 0.1 K/uL (ref 0.0–0.5)
Eosinophils Relative: 1 %
Immature Granulocytes: 0 %
Lymphocytes Relative: 17 %
Lymphs Abs: 1.6 K/uL (ref 0.7–4.0)
Monocytes Absolute: 0.8 K/uL (ref 0.1–1.0)
Monocytes Relative: 9 %
Neutro Abs: 6.6 K/uL (ref 1.7–7.7)
Neutrophils Relative %: 73 %

## 2024-04-27 LAB — PROTIME-INR
INR: 1 (ref 0.8–1.2)
Prothrombin Time: 13.3 s (ref 11.4–15.2)

## 2024-04-27 LAB — CBG MONITORING, ED: Glucose-Capillary: 75 mg/dL (ref 70–99)

## 2024-04-27 LAB — ETHANOL: Alcohol, Ethyl (B): <15 mg/dL (ref <15)

## 2024-04-27 LAB — APTT: aPTT: 25 s (ref 24–36)

## 2024-04-27 MED ORDER — TICAGRELOR 60 MG PO TABS
60.0000 mg | ORAL_TABLET | Freq: Two times a day (BID) | ORAL | Status: DC
  Administered 2024-04-28 – 2024-04-29 (×4): 60 mg via ORAL
  Filled 2024-04-27 (×5): qty 1

## 2024-04-27 MED ORDER — VITAMIN B-12 100 MCG PO TABS
500.0000 ug | ORAL_TABLET | ORAL | Status: DC
  Administered 2024-04-29: 500 ug via ORAL
  Filled 2024-04-27: qty 5

## 2024-04-27 MED ORDER — STROKE: EARLY STAGES OF RECOVERY BOOK
Freq: Once | Status: AC
  Filled 2024-04-27: qty 1

## 2024-04-27 MED ORDER — ACETAMINOPHEN 160 MG/5ML PO SOLN: 650.0000 mg | ORAL | Status: DC | PRN

## 2024-04-27 MED ORDER — COENZYME Q10 100 MG PO CAPS: 1.0000 | ORAL_CAPSULE | Freq: Every day | ORAL | Status: DC

## 2024-04-27 MED ORDER — ATORVASTATIN CALCIUM 80 MG PO TABS
80.0000 mg | ORAL_TABLET | Freq: Every day | ORAL | Status: DC
  Administered 2024-04-28 – 2024-04-29 (×2): 80 mg via ORAL
  Filled 2024-04-27 (×2): qty 1

## 2024-04-27 MED ORDER — ISOSORBIDE MONONITRATE ER 60 MG PO TB24
60.0000 mg | ORAL_TABLET | Freq: Every day | ORAL | Status: DC
Start: 2024-04-28 — End: 2024-04-29
  Administered 2024-04-28 – 2024-04-29 (×2): 60 mg via ORAL
  Filled 2024-04-27 (×2): qty 1

## 2024-04-27 MED ORDER — ACETAMINOPHEN 650 MG RE SUPP: 650.0000 mg | RECTAL | Status: DC | PRN

## 2024-04-27 MED ORDER — SENNOSIDES-DOCUSATE SODIUM 8.6-50 MG PO TABS: 1.0000 | ORAL_TABLET | Freq: Every evening | ORAL | Status: DC | PRN

## 2024-04-27 MED ORDER — ENOXAPARIN SODIUM 40 MG/0.4ML IJ SOSY
40.0000 mg | PREFILLED_SYRINGE | INTRAMUSCULAR | Status: DC
  Administered 2024-04-28 – 2024-04-29 (×2): 40 mg via SUBCUTANEOUS
  Filled 2024-04-27 (×2): qty 0.4

## 2024-04-27 MED ORDER — SODIUM CHLORIDE 0.9 % IV SOLN: INTRAVENOUS | Status: DC

## 2024-04-27 MED ORDER — RANOLAZINE ER 500 MG PO TB12
500.0000 mg | ORAL_TABLET | Freq: Two times a day (BID) | ORAL | Status: DC
  Administered 2024-04-28 – 2024-04-29 (×4): 500 mg via ORAL
  Filled 2024-04-27 (×5): qty 1

## 2024-04-27 MED ORDER — FAMOTIDINE 20 MG PO TABS: 40.0000 mg | ORAL_TABLET | Freq: Every day | ORAL | Status: DC | PRN

## 2024-04-27 MED ORDER — EZETIMIBE 10 MG PO TABS
10.0000 mg | ORAL_TABLET | Freq: Every day | ORAL | Status: DC
Start: 2024-04-28 — End: 2024-04-29
  Administered 2024-04-28 – 2024-04-29 (×2): 10 mg via ORAL
  Filled 2024-04-27 (×2): qty 1

## 2024-04-27 MED ORDER — ASPIRIN 81 MG PO TBEC
81.0000 mg | DELAYED_RELEASE_TABLET | Freq: Every day | ORAL | Status: DC
  Administered 2024-04-28 – 2024-04-29 (×2): 81 mg via ORAL
  Filled 2024-04-27 (×2): qty 1

## 2024-04-27 MED ORDER — SODIUM CHLORIDE 0.9% FLUSH: 3.0000 mL | Freq: Once | INTRAVENOUS | Status: DC

## 2024-04-27 MED ORDER — ACETAMINOPHEN 325 MG PO TABS: 650.0000 mg | ORAL_TABLET | ORAL | Status: DC | PRN

## 2024-04-27 NOTE — Consult Note (Signed)
 [facility] TeleNeurology Consult Services    Date of Service:  04/27/2024     Metrics: Last Known Well: 10:00 AM Symptoms: As per HPI.  Patient is not a candidate for thrombolytic.   Location of the provider: [facility]  Location of the patient: [facility] Pre-Morbid Modified Rankin Scale: 1  Time NIHSS completed: 12:56 PM    This consult was provided via telemedicine with 2-way video and audio communication. The patient/family was informed that care would be provided in this way and agreed to receive care in this manner.   ED Physician notified of diagnostic impression and management plan following completion of patient examination.   Assessment: 68 year old male presenting with acute onset of left sided vision loss, speech deficit, mild confusion and left sided numbness with weakness.   - Exam reveals mild speech abnormalities and crescentic left homonymous hemianopsia. NIHSS 3 - CT head: No acute intracranial abnormality. Chronic encephalomalacia changes medially within the right occipital lobe, stable compared to prior study dated 04/20/2024. - EKG: Sinus rhythm; Borderline intraventricular conduction delay; Low voltage, precordial leads Borderline prolonged QT interval - Labs are essentially unremarkable.  - Impression: Recurrent stroke-like symptoms referable to the right cerebral hemisphere, occipital and motor strip, as well as a possible left hemispheric localization given speech deficits. Reoccurrence of new stroke symptoms over a short time span, with symptoms referable to separate vascular territories suggests possible cardioembolic mechanism.      Recommendations: - Continue his home ASA, ticagrelor  and atorvastatin .  - TEE to further assess for possible intracardiac thrombus or valvular vegetation - Interrogation of his 30-day cardiac event monitor.  - HgbA1c, fasting lipid panel - Repeat MRI brain  - Carotid ultrasound to  see if an unstable plaque was possibly missed by last week's CTA - PT consult, OT consult, Speech consult - Risk factor modification - Frequent neuro checks - NPO until passes stroke swallow screen - Permissive HTN x 24 hours - IVF     ------------------------------------------------------------------------------   History of Present Illness: 68 year old male with a PMHx of CAD s/p angioplasty with stenting, HTN, HLD, acute right occipital stroke last Wednesday (worked up at [facility]) with residual left-sided peripheral vision loss and evidence for an additional chronic right occipital lobe stroke on last week's imaging, who re-presents to the ED today after acute onset today of bilateral tunnel vision, trouble putting his thoughts together to think or speak, numbness to left jaw, ear, neck and upper extremity, mild intermittent double vision, LUE heaviness and LLE incoordination. He describes his thought process at the peak of his symptoms as brain was stuttering.   He currently is wearing a 30-day event monitor. He is not complaining of a headache or systemic symptoms. A follow up note from [facility] primary care today documents that the patient recalls that about 1.5 years ago he may have had some mild vision loss in right eye, but had not mentioned to optho previously. He reported today missing several statin days, as he was trying to see if it would help with rectal itching issues. He was thought to have a jock itch element so he was started on clotrimazole -betamethasone .    Prior to last week's stroke, he had missed 2 or 3 doses of his ASA and Brilinta .    Stroke workup last week included the following tests:  - CT head: no acute infarct - MRI brain: Acute infarct in inferior right occipital cortex, remote infarct in medial right occipital lobe,  mild chronic microvascular ischemic changes - CTA head and neck: No large vessel occlusion or hemodynamically significant stenosis, remote right  occipital infarct - TTE: 1. Left ventricular ejection fraction, by estimation, is 55 to 60%. The  left ventricle has normal function. The left ventricle has no regional  wall motion abnormalities. Left ventricular diastolic parameters are  consistent with Grade I diastolic dysfunction (impaired relaxation).   2. Right ventricular systolic function is normal. The right ventricular  size is normal.   3. The mitral valve is normal in structure. Trivial mitral valve  regurgitation. No evidence of mitral stenosis.   4. The aortic valve is tricuspid. There is mild calcification of the  aortic valve. Aortic valve regurgitation is not visualized. Aortic valve  sclerosis/calcification is present, without any evidence of aortic  stenosis.   5. Agitated saline contrast bubble study was negative, with no evidence  of any interatrial shunt.   Past Medical History: Past Medical History:  Diagnosis Date   Acute lateral meniscal tear, right, initial encounter 09/26/2021   Injection given September 26, 2018.   CAD S/P PCI to Cx & RCA    a. s/p PCI of LCx (2006) and ostium of RCA (2007) as well as DES to RCA in [place] (05/2014)   HTN (hypertension)    Hyperlipidemia    Myocardial infarction (HCC) 05/29/2011   Plantar fasciitis 10/12/2017      Past Surgical History: Past Surgical History:  Procedure Laterality Date   CARDIAC CATHETERIZATION N/A 05/06/2016   Procedure: Left Heart Cath and Coronary Angiography;  Surgeon: Lonni Hanson, MD;  Location: [facility] INVASIVE CV LAB;  Service: Cardiovascular;  Laterality: N/A;   CARDIAC CATHETERIZATION N/A 05/06/2016   Procedure: Coronary Stent Intervention;  Surgeon: Lonni Hanson, MD;  Location: [facility] INVASIVE CV LAB;  Service: Cardiovascular;  Laterality: N/A;   CARDIAC CATHETERIZATION N/A 05/13/2016   Procedure: Coronary/Graft Angiography;  Surgeon: Peter M Swaziland, MD;  Location: [facility] INVASIVE CV LAB;  Service: Cardiovascular;  Laterality: N/A;   CORONARY  ANGIOPLASTY WITH STENT PLACEMENT  09/22/2004   in [place] , occluded CFX, s/p Taxus stent   CORONARY ANGIOPLASTY WITH STENT PLACEMENT  09/22/2005   in [place] , ostial RCA stent   CORONARY ANGIOPLASTY WITH STENT PLACEMENT  09/22/2013   in [place] , RCA stent   CORONARY PRESSURE/FFR STUDY N/A 10/02/2023   Procedure: CORONARY PRESSURE/FFR STUDY;  Surgeon: Wendel Lurena POUR, MD;  Location: [facility] INVASIVE CV LAB;  Service: Cardiovascular;  Laterality: N/A;   LEFT HEART CATH AND CORONARY ANGIOGRAPHY N/A 10/02/2023   Procedure: LEFT HEART CATH AND CORONARY ANGIOGRAPHY;  Surgeon: Wendel Lurena POUR, MD;  Location: [facility] INVASIVE CV LAB;  Service: Cardiovascular;  Laterality: N/A;   TONSILLECTOMY AND ADENOIDECTOMY          Medications:  No current facility-administered medications on file prior to encounter.   Current Outpatient Medications on File Prior to Encounter  Medication Sig Dispense Refill   aspirin  81 MG tablet Take 81 mg by mouth daily.     atorvastatin  (LIPITOR ) 80 MG tablet TAKE 1 TABLET DAILY 90 tablet 3   Coenzyme Q10 (Q-SORB CO Q-10) 100 MG capsule Take 1 capsule by mouth daily.     ezetimibe  (ZETIA ) 10 MG tablet Take 1 tablet (10 mg total) by mouth daily. 30 tablet 11   famotidine  (PEPCID ) 40 MG tablet Take 1 tablet (40 mg total) by mouth daily. (Patient taking differently: Take 40 mg by mouth daily as needed for heartburn or indigestion.) 90 tablet 3  isosorbide  mononitrate (IMDUR ) 60 MG 24 hr tablet TAKE 1 TABLET DAILY 90 tablet 3   ranolazine  (RANEXA ) 500 MG 12 hr tablet Take 1 tablet (500 mg total) by mouth 2 (two) times daily. 60 tablet 5   ticagrelor  (BRILINTA ) 60 MG TABS tablet Take 1 tablet (60 mg total) by mouth 2 (two) times daily. 180 tablet 3        Social History: No drug or EtOH use Never smoker   Family History:  Reviewed in Epic   ROS: As per HPI    Anticoagulant use:  No   Antiplatelet use: Yes   Examination:    BP 122/62, HR 76, O2 sat 98%,  RR 12, afebrile   1A: Level of Consciousness - 0 1B: Ask Month and Age - 0 1C: Blink Eyes & Squeeze Hands - 0 2: Test Horizontal Extraocular Movements - 0 3: Test Visual Fields - 2  (left homonymous crescentic peripheral field defect) 4: Test Facial Palsy (Use Grimace if Obtunded) - 0 5A: Test Left Arm Motor Drift - 0 5B: Test Right Arm Motor Drift - 0 6A: Test Left Leg Motor Drift - 0 6B: Test Right Leg Motor Drift - 0 7: Test Limb Ataxia (FNF/Heel-Shin) - 0 (subtle LUE intention tremor noted) 8: Test Sensation -  0 9: Test Language/Aphasia - 1 (mild naming deficit, mild speech hesitancy, subtle intermittent phonemic paraphasias) 10: Test Dysarthria - 0 11: Test Extinction/Inattention - 0   NIHSS Score: 3     Patient/Family was informed the Neurology Consult would occur via TeleHealth consult by way of interactive audio and video telecommunications and consented to receiving care in this manner.   Patient is being evaluated for possible acute neurologic impairment and high pretest probability of imminent or life-threatening deterioration. I spent total of 40 minutes providing care to this patient, including time for face to face visit via telemedicine, review of medical records, imaging studies and discussion of findings with providers, the patient and/or family.   Electronically signed: Dr. Angle Dirusso

## 2024-04-27 NOTE — ED Provider Notes (Addendum)
 [place] [facility]PARTMENT AT [facility] Provider Note   CSN: [id number] Arrival date & time: 04/27/24  1205     Patient presents with: Code Stroke   Michael [name] is a 68 y.o. male.   Patient with concerns for recurrent stroke.  At around 10 this morning certainly not earlier he developed some visual problems in the left eye again.  Some speech problems and left-sided weakness lower extremity greater than upper extremity.  Patient with recent admission July 30 through August 1 for acute right hip middle lobe infarct possibly secondary to A-fib.  The speech problems and the extremity weakness would be new symptoms for him.  Patient is on Plavix and aspirin .  Not on Coumadin and Eliquis or Xarelto.       Prior to Admission medications   Medication Sig Start Date End Date Taking? Authorizing Provider  aspirin  81 MG tablet Take 81 mg by mouth daily.    [provider]  atorvastatin  (LIPITOR ) 80 MG tablet TAKE 1 TABLET DAILY 10/21/23   Lavona Agent, MD  Coenzyme Q10 (Q-SORB CO Q-10) 100 MG capsule Take 1 capsule by mouth daily.    [provider]  ezetimibe  (ZETIA ) 10 MG tablet Take 1 tablet (10 mg total) by mouth daily. 04/22/24 04/22/25  Samtani, Jai-Gurmukh, MD  famotidine  (PEPCID ) 40 MG tablet Take 1 tablet (40 mg total) by mouth daily. Patient taking differently: Take 40 mg by mouth daily as needed for heartburn or indigestion. 07/15/22   [name], MD  isosorbide  mononitrate (IMDUR ) 60 MG 24 hr tablet TAKE 1 TABLET DAILY 10/20/23   Lavona Agent, MD  ranolazine  (RANEXA ) 500 MG 12 hr tablet Take 1 tablet (500 mg total) by mouth 2 (two) times daily. 10/03/23   [name], PA-C  ticagrelor  (BRILINTA ) 60 MG TABS tablet Take 1 tablet (60 mg total) by mouth 2 (two) times daily. 06/22/23   Lavona Agent, MD    Allergies: Patient has no known allergies.    Review of Systems  Constitutional:  Negative for chills and fever.  HENT:   Negative for rhinorrhea and sore throat.   Eyes:  Positive for visual disturbance.  Respiratory:  Negative for cough and shortness of breath.   Cardiovascular:  Negative for chest pain and leg swelling.  Gastrointestinal:  Negative for abdominal pain, diarrhea, nausea and vomiting.  Genitourinary:  Negative for dysuria.  Musculoskeletal:  Negative for back pain and neck pain.  Skin:  Negative for rash.  Neurological:  Positive for speech difficulty and weakness. Negative for dizziness, light-headedness and headaches.  Hematological:  Does not bruise/bleed easily.  Psychiatric/Behavioral:  Negative for confusion.     Updated Vital Signs BP 122/62   Pulse 76   Temp (P) 97.8 F (36.6 C) (Oral)   Resp 12   SpO2 98%   Physical Exam Vitals and nursing note reviewed.  Constitutional:      General: He is not in acute distress.    Appearance: Normal appearance. He is well-developed.  HENT:     Head: Normocephalic and atraumatic.  Eyes:     Extraocular Movements: Extraocular movements intact.     Conjunctiva/sclera: Conjunctivae normal.     Pupils: Pupils are equal, round, and reactive to light.  Cardiovascular:     Rate and Rhythm: Normal rate and regular rhythm.     Heart sounds: No murmur heard. Pulmonary:     Effort: Pulmonary effort is normal. No respiratory distress.     Breath  sounds: Normal breath sounds.  Abdominal:     Palpations: Abdomen is soft.     Tenderness: There is no abdominal tenderness.  Musculoskeletal:        General: No swelling.     Cervical back: Neck supple.  Skin:    General: Skin is warm and dry.     Capillary Refill: Capillary refill takes less than 2 seconds.  Neurological:     Mental Status: He is alert and oriented to person, place, and time.     Cranial Nerves: Cranial nerve deficit present.     Motor: Weakness present.  Psychiatric:        Mood and Affect: Mood normal.     (all labs ordered are listed, but only abnormal results are  displayed) Labs Reviewed  CBC  DIFFERENTIAL  PROTIME-INR  APTT  COMPREHENSIVE METABOLIC PANEL WITH GFR  ETHANOL  CBG MONITORING, ED  CBG MONITORING, ED    EKG: None  Radiology: No results found.   Procedures   Medications Ordered in the ED  sodium chloride  flush (NS) 0.9 % injection 3 mL (has no administration in time range)                                    Medical Decision Making Amount and/or Complexity of Data Reviewed Labs: ordered. Radiology: ordered.  Risk Decision regarding hospitalization.   CRITICAL CARE Performed by: Lazlo Tunney Total critical care time: 45 minutes Critical care time was exclusive of separately billable procedures and treating other patients. Critical care was necessary to treat or prevent imminent or life-threatening deterioration. Critical care was time spent personally by me on the following activities: development of treatment plan with patient and/or surrogate as well as nursing, discussions with consultants, evaluation of patient's response to treatment, examination of patient, obtaining history from patient or surrogate, ordering and performing treatments and interventions, ordering and review of laboratory studies, ordering and review of radiographic studies, pulse oximetry and re-evaluation of patient's condition.  Patient's symptoms consistent with either new or recurrent stroke.  Code stroke will be activated.  Patient not on a blood thinner.  But did have a documented stroke on July 30.  In the right of supra lobe area.  Patient will probably require MRI.  Head CT without any acute findings other than the old remote infarct.  Stroke order set activated code stroke team activated.  EKG is not currently atrial fibs is consistent with sinus rhythm.   Final diagnoses:  Cerebrovascular accident (CVA), unspecified mechanism [facility])    ED Discharge Orders     None          Geraldene Hamilton, MD 04/27/24 1233     Geraldene Hamilton, MD 04/27/24 1233

## 2024-04-27 NOTE — ED Triage Notes (Signed)
 DIFFICULTY FINDING WORDS.  SENSATION DIFFERENCES ON LEFT. WEAKNESS WHEN WALKING Visual field disturbance Left arm feels heavy  TREATed last week for stroke  Started around 11:30 AM

## 2024-04-27 NOTE — Addendum Note (Signed)
 Addended by: [name] on: 04/27/2024 08:41 AM   Modules accepted: Level of Service

## 2024-04-27 NOTE — Progress Notes (Signed)
 Phone [phone] In person visit   Subjective:   Michael [name] is a 68 y.o. year old very pleasant male patient who presents for/with See problem oriented charting Chief Complaint  Patient presents with   Medical Management of Chronic Issues    Pt has questions regarding hospital discharge; not due for colonoscopy until may 2026   Hypertension    Past Medical History-  Patient Active Problem List   Diagnosis Date Noted   Ischemic cardiomyopathy 12/05/2019    Priority: High   CAD S/P multiple PCIs 09/08/2014    Priority: High   History of adenomatous polyp of colon 06/07/2018    Priority: Medium    BPH (benign prostatic hyperplasia) 06/07/2018    Priority: Medium    Impaired glucose tolerance 11/03/2016    Priority: Medium    HTN (hypertension)     Priority: Medium    Dyslipidemia 09/08/2014    Priority: Medium    Left shoulder pain 01/24/2021    Priority: Low   AC (acromioclavicular) arthritis 01/24/2021    Priority: Low   Vitiligo 06/07/2018    Priority: Low   Gluteal pain 04/10/2018    Priority: Low   Back pain 04/13/2017    Priority: Low   Nonallopathic lesion of thoracic region 04/13/2017    Priority: Low   Nonallopathic lesion of sacral region 04/13/2017    Priority: Low   Nonallopathic lesion of lumbosacral region 04/13/2017    Priority: Low   Obesity     Priority: Low   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   GERD (gastroesophageal reflux disease) 04/21/2024   Partial nontraumatic tear of right rotator cuff 03/09/2024   Essential hypertension 12/20/2023   Mixed hyperlipidemia 10/01/2023   Benign hypertension 10/01/2023   Acute bursitis of right shoulder 08/25/2023   Achilles tendinosis of left ankle 07/13/2023   SI (sacroiliac) joint dysfunction 09/02/2022   Gluteal tendinitis of right buttock 03/04/2022   Coronary artery disease with stable angina pectoris, unspecified vessel or lesion type, unspecified whether native or transplanted  heart (HCC) 02/06/2022   Degenerative arthritis of right knee 11/15/2021   Cervical disc disorder with radiculopathy of cervical region 03/15/2021   Abnormal chest x-ray 01/24/2021   Right carpal tunnel syndrome 09/05/2020   Perianal irritation 06/08/2020   Unstable angina (HCC)     Medications- reviewed and updated Current Outpatient Medications  Medication Sig Dispense Refill   aspirin  81 MG tablet Take 81 mg by mouth daily.     atorvastatin  (LIPITOR ) 80 MG tablet TAKE 1 TABLET DAILY 90 tablet 3   Coenzyme Q10 (Q-SORB CO Q-10) 100 MG capsule Take 1 capsule by mouth daily.     ezetimibe  (ZETIA ) 10 MG tablet Take 1 tablet (10 mg total) by mouth daily. 30 tablet 11   famotidine  (PEPCID ) 40 MG tablet Take 1 tablet (40 mg total) by mouth daily. (Patient taking differently: Take 40 mg by mouth daily as needed for heartburn or indigestion.) 90 tablet 3   isosorbide  mononitrate (IMDUR ) 60 MG 24 hr tablet TAKE 1 TABLET DAILY 90 tablet 3   ranolazine  (RANEXA ) 500 MG 12 hr tablet Take 1 tablet (500 mg total) by mouth 2 (two) times daily. 60 tablet 5   ticagrelor  (BRILINTA ) 60 MG TABS tablet Take 1 tablet (60 mg total) by mouth 2 (two) times daily. 180 tablet 3   No current facility-administered medications for this visit.     Objective:  BP 100/60 (BP Location: Right Arm, Patient Position: Sitting, Cuff  Size: Normal)   Pulse 73   Temp (!) 97.4 F (36.3 C) (Temporal)   Ht 5' 6 (1.676 m)   Wt 193 lb 3.2 oz (87.6 kg)   SpO2 96%   BMI 31.18 kg/m  Gen: NAD, resting comfortably CV: RRR no murmurs rubs or gallops Lungs: CTAB no crackles, wheeze, rhonchi Abdomen: soft/nontender/nondistended/normal bowel sounds. No rebound or guarding.  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 He also had a remote right occipital lobe infarct. Remote infarct potentially related to atrial fibrillation. Neurology/stroke team involved. Cardiology also consulted and ILR recommended but patient opted for 30-day event monitor. -he also reflects back about 1.5 years ago and may have had some mild vision loss in right eye- had not mentioned to optho previously.  - he reports today missing several statin days- trying to see if it would help with rectal itching issues. Was thought to have jock itch element- started on clotrimazole -betamethasone .   They also  - Continued aspirin  81 mg daily. - Continued Brilinta  as prescribed. - opted to Consider ILR if 30-day monitor is unrevealing. -Recommended no driving for now due to vision loss.  Persistent visual deficit in left upper outer quadrant-there was some improvement during hospitalization- he reports significant improvement . 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   Of note - did have Mild anemia with RBC at 4.21, slightly below normal range but could be from blood draws  Stroke workup included:  RADIOLOGY CT head: no acute infarct MRI brain: Acute infarct in inferior right occipital cortex, remote infarct in medial right occipital lobe, mild chronic microvascular ischemic changes (04/20/2024) CTA head and neck: No large vessel occlusion or hemodynamically  significant stenosis, remote right occipital infarct (04/20/2024)  DIAGNOSTIC 2D echocardiogram: Ejection fraction 55-60%, grade one diastolic dysfunction, no intraatrial shunt, normal left atrial size (04/20/2024) A/P: with recent stroke- continue aspirin , Brilinta  (may have missed some), atorvastatin  80 mg (and he will be more consistent) - and stay on added Zetia  for now -check lipids in about [redacted] weeks along with CBC to recheck mildest of anemia -he will see eye doctor in about a month - neurology wanted 2 month follow up   #CAD with angina-sees Dr. Lavona  #hyperlipidemia-LDL goal under 70 S: Medication:Atorvastatin  80 mg, aspirin  81 mg, Brilinta  60 mg twice daily, Imdur  60 mg, Ranexa  500 mg twice daily, nitroglycerin  on hand - Zetia  recently added  -Lipoprotein a not elevated  Current symptoms:no chest pain or shortness of breath    A/P: For CAD asymptomatic continue current medications   For hyperlipidemia-above goal- is going to consistent with atorvastatin  and stay on Zetia  with 6 week repeat   # Ischemic cardiomyopathy-echocardiogram 10/02/23 with ejection fraction of 45 to 50% with regional  wall motion abnormalities consistent with previous myocardial infarction's- overall stable- no shortness of breath or signficant swelling   #hypertension S: medication: Imdur  60 mg. Not lightheaded with lower blood pressure today  BP Readings from Last 3 Encounters:  04/27/24 100/60  04/22/24 104/62  04/13/24 130/68  A/P: well controlled continue current medications     # Hyperglycemia/insulin resistance/prediabetes-peak A1c 6.3 S:  Medication: none A/P: a1c improved most recently to 5.4- recheck at 6 months   Recommended follow up: Return in about 6 months (around 10/28/2024) for followup or sooner if needed.Schedule b4 you leave. Future Appointments  Date Time Provider Department Center  04/10/2025  8:40 AM [facility] ANNUAL WELLNESS VISIT 1 [facility]    Lab/Order associations:    ICD-10-CM   1. History of CVA (cerebrovascular accident)  Z86.73     2. Monocular vision loss  H54.60     3. CAD S/P multiple PCIs  I25.10    Z98.61     4. Primary hypertension  I10     5. Dyslipidemia  E78.5     6. Essential hypertension  I10       No orders of the defined types were placed in this encounter.   Return precautions advised.  Garnette Lukes, MD

## 2024-04-27 NOTE — H&P (Signed)
 History and Physical    Patient: Michael [name] FMW:[id number] DOB: 07/23/1956 DOA: 04/27/2024 DOS: the patient was seen and examined on 04/27/2024 PCP: [name], MD  Patient coming from: Home  Chief Complaint:  Chief Complaint  Patient presents with   Code Stroke   HPI: Anthoni [name] is a 68 y.o. male with medical history significant of recent CVA, essential hypertension, hyperlipidemia, heart failure with reduced ejection fraction which has improved, right rotator cuff tear, GERD coronary artery disease who was discharged on a August 1 after his CVA.  Patient has had previous PCI.  At time of discharge also he was discharged on aspirin , statin and Triklo.  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  If he was suspected.  Patient also has Zetia  added.  Patient returned to the ER today with recurrent symptoms with left-sided vision loss, speech deficits, mild confusion and left-sided numbness and weakness.  He had an NIHSS of 3 on arrival.  Head CT without contrast was performed that showed no significant other changes.  Neurology was suspected with impression of recurrent stroke like symptoms that are now referred to the right cerebral hemisphere, hospital motor strip and possible left hemispheric localization suggested possible embolic causes.  Neurology recommended admission and workup again for further evaluation.  Review of Systems: As mentioned in the history of present illness. All other systems reviewed and are negative. Past Medical History:  Diagnosis Date   Acute lateral meniscal tear, right, initial encounter 09/26/2021   Injection given September 26, 2018.   CAD S/P PCI to Cx & RCA    a. s/p PCI of LCx (2006) and ostium of RCA (2007) as well as DES to RCA in [place] (05/2014)   HTN (hypertension)    Hyperlipidemia    Myocardial infarction (HCC) 05/29/2011   Plantar fasciitis 10/12/2017   Past  Surgical History:  Procedure Laterality Date   CARDIAC CATHETERIZATION N/A 05/06/2016   Procedure: Left Heart Cath and Coronary Angiography;  Surgeon: Lonni Hanson, MD;  Location: [facility] INVASIVE CV LAB;  Service: Cardiovascular;  Laterality: N/A;   CARDIAC CATHETERIZATION N/A 05/06/2016   Procedure: Coronary Stent Intervention;  Surgeon: Lonni Hanson, MD;  Location: [facility] INVASIVE CV LAB;  Service: Cardiovascular;  Laterality: N/A;   CARDIAC CATHETERIZATION N/A 05/13/2016   Procedure: Coronary/Graft Angiography;  Surgeon: Peter M Sw[place], MD;  Location: [facility] INVASIVE CV LAB;  Service: Cardiovascular;  Laterality: N/A;   CORONARY ANGIOPLASTY WITH STENT PLACEMENT  09/22/2004   in [place] , occluded CFX, s/p Taxus stent   CORONARY ANGIOPLASTY WITH STENT PLACEMENT  09/22/2005   in [place] , ostial RCA stent   CORONARY ANGIOPLASTY WITH STENT PLACEMENT  09/22/2013   in [place] , RCA stent   CORONARY PRESSURE/FFR STUDY N/A 10/02/2023   Procedure: CORONARY PRESSURE/FFR STUDY;  Surgeon: Wendel Lurena POUR, MD;  Location: [facility] INVASIVE CV LAB;  Service: Cardiovascular;  Laterality: N/A;   LEFT HEART CATH AND CORONARY ANGIOGRAPHY N/A 10/02/2023   Procedure: LEFT HEART CATH AND CORONARY ANGIOGRAPHY;  Surgeon: Wendel Lurena POUR, MD;  Location: [facility] INVASIVE CV LAB;  Service: Cardiovascular;  Laterality: N/A;   TONSILLECTOMY AND ADENOIDECTOMY      Social History:  reports that he has never smoked. He has never used smokeless tobacco. He reports that he does not drink alcohol and does not use drugs.  No Known Allergies  Family History  Problem Relation Age of Onset   Hypertension Father  Dementia Father        Vascular. patient states dementia/possible alzheimers as well   Hyperlipidemia Sister    Cancer Brother        type unknown   Cancer Brother        type unknown   Heart disease Maternal Grandfather 33       Died probably of heart diseasse   Heart disease Maternal Uncle 55       Died  of heart exploding   Cerebral palsy Daughter    Stroke Son        severe problems with blood clotting   Colon cancer Neg Hx    Esophageal cancer Neg Hx    Pancreatic cancer Neg Hx    Stomach cancer Neg Hx    Liver disease Neg Hx     Prior to Admission medications   Medication Sig Start Date End Date Taking? Authorizing Provider  aspirin  81 MG tablet Take 81 mg by mouth daily.   Yes [provider]  atorvastatin  (LIPITOR ) 80 MG tablet TAKE 1 TABLET DAILY 10/21/23  Yes Lavona Agent, MD  Coenzyme Q10 (Q-SORB CO Q-10) 100 MG capsule Take 1 capsule by mouth daily.   Yes [provider]  Cyanocobalamin  (VITAMIN B-12 PO) Take 1 tablet by mouth every other day.   Yes [provider]  ezetimibe  (ZETIA ) 10 MG tablet Take 1 tablet (10 mg total) by mouth daily. 04/22/24 04/22/25 Yes Samtani, Jai-Gurmukh, MD  famotidine  (PEPCID ) 40 MG tablet Take 1 tablet (40 mg total) by mouth daily. Patient taking differently: Take 40 mg by mouth daily as needed for heartburn or indigestion. 07/15/22  Yes [name], MD  isosorbide  mononitrate (IMDUR ) 60 MG 24 hr tablet TAKE 1 TABLET DAILY 10/20/23  Yes Lavona Agent, MD  ranolazine  (RANEXA ) 500 MG 12 hr tablet Take 1 tablet (500 mg total) by mouth 2 (two) times daily. 10/03/23  Yes [name], PA-C  ticagrelor  (BRILINTA ) 60 MG TABS tablet Take 1 tablet (60 mg total) by mouth 2 (two) times daily. 06/22/23  Yes Lavona Agent, MD    Physical Exam: Vitals:   04/27/24 2000 04/27/24 2241 04/27/24 2302 04/27/24 2326  BP:  (!) 127/55  136/63  Pulse: 73 60  62  Resp: 18 12  16   Temp:   98.1 F (36.7 C) 98.2 F (36.8 C)  TempSrc:   Oral   SpO2: 98% 95%  97%   Constitutional: Acutely ill looking, NAD, calm, comfortable Eyes: PERRL, lids and conjunctivae normal ENMT: Mucous membranes are moist. Posterior pharynx clear of any exudate or lesions.Normal dentition.  Neck: normal, supple, no masses, no thyromegaly Respiratory:  clear to auscultation bilaterally, no wheezing, no crackles. Normal respiratory effort. No accessory muscle use.  Cardiovascular: Regular rate and rhythm, no murmurs / rubs / gallops. No extremity edema. 2+ pedal pulses. No carotid bruits.  Abdomen: no tenderness, no masses palpated. No hepatosplenomegaly. Bowel sounds positive.  Musculoskeletal: Good range of motion, no joint swelling or tenderness, Skin: no rashes, lesions, ulcers. No induration Neurologic: Mildly dysarthric, poor vision bilaterally, essential left homonymous hemianopsia, Psychiatric: Normal judgment and insight. Alert and oriented x 3. Normal mood  Data Reviewed:  Blood pressure 127/55, labs essentially within normal.  Assessment and Plan:  #1 recurrent CVA symptoms: Suspected to have an embolic event.  Patient has had recent TTE and has been on aspirin , statin and Zetia  was added.  Neurology has seen the patient already.  We will follow recommendations.  Will continue aspirin , ticagrelor , atorvastatin .  Patient will have TEE ordered for possible thrombus.  30-day cardiac event monitor was placed on the patient prior to and will be interrogated.  Will repeat MRI of the brain with carotid ultrasound.  Continue other treatment including PT OT and speech.  Permissive hypertension the first 24 hours.  Patient has had lipid panel and A1c recently so no repeat.  #2 coronary artery disease: Stable.  Continue cardiac medications.  #3 essential hypertension: Continue to monitor blood pressure.  Continue home regimen  #4 hyperlipidemia: Continue with statin  #5 ischemic cardiomyopathy: EF has improved.  Currently within normal.  #6 history of BPH: No urinary symptoms.  Continue home regimen.    Advance Care Planning:   Code Status: Full Code   Consults: Dr. Merrianne, neurology  Family Communication: No family at bedside  Severity of Illness: The appropriate patient status for this patient is INPATIENT. Inpatient status is  judged to be reasonable and necessary in order to provide the required intensity of service to ensure the patient's safety. The patient's presenting symptoms, physical exam findings, and initial radiographic and laboratory data in the context of their chronic comorbidities is felt to place them at high risk for further clinical deterioration. Furthermore, it is not anticipated that the patient will be medically stable for discharge from the hospital within 2 midnights of admission.   * I certify that at the point of admission it is my clinical judgment that the patient will require inpatient hospital care spanning beyond 2 midnights from the point of admission due to high intensity of service, high risk for further deterioration and high frequency of surveillance required.*  AuthorBETHA SIM KNOLL, MD 04/27/2024 11:36 PM  For on call review [email or web address].

## 2024-04-28 ENCOUNTER — Other Ambulatory Visit: Payer: Self-pay

## 2024-04-28 ENCOUNTER — Observation Stay (HOSPITAL_COMMUNITY)

## 2024-04-28 DIAGNOSIS — Z955 Presence of coronary angioplasty implant and graft: Secondary | ICD-10-CM | POA: Diagnosis not present

## 2024-04-28 DIAGNOSIS — Z7982 Long term (current) use of aspirin: Secondary | ICD-10-CM | POA: Diagnosis not present

## 2024-04-28 DIAGNOSIS — G459 Transient cerebral ischemic attack, unspecified: Secondary | ICD-10-CM | POA: Diagnosis not present

## 2024-04-28 DIAGNOSIS — I639 Cerebral infarction, unspecified: Secondary | ICD-10-CM | POA: Diagnosis present

## 2024-04-28 DIAGNOSIS — Z83438 Family history of other disorder of lipoprotein metabolism and other lipidemia: Secondary | ICD-10-CM | POA: Diagnosis not present

## 2024-04-28 DIAGNOSIS — E66811 Obesity, class 1: Secondary | ICD-10-CM | POA: Diagnosis present

## 2024-04-28 DIAGNOSIS — H53462 Homonymous bilateral field defects, left side: Secondary | ICD-10-CM | POA: Diagnosis present

## 2024-04-28 DIAGNOSIS — R29703 NIHSS score 3: Secondary | ICD-10-CM | POA: Diagnosis present

## 2024-04-28 DIAGNOSIS — I252 Old myocardial infarction: Secondary | ICD-10-CM | POA: Diagnosis not present

## 2024-04-28 DIAGNOSIS — Z8249 Family history of ischemic heart disease and other diseases of the circulatory system: Secondary | ICD-10-CM | POA: Diagnosis not present

## 2024-04-28 DIAGNOSIS — H5462 Unqualified visual loss, left eye, normal vision right eye: Secondary | ICD-10-CM | POA: Diagnosis present

## 2024-04-28 DIAGNOSIS — I25118 Atherosclerotic heart disease of native coronary artery with other forms of angina pectoris: Secondary | ICD-10-CM | POA: Diagnosis not present

## 2024-04-28 DIAGNOSIS — N400 Enlarged prostate without lower urinary tract symptoms: Secondary | ICD-10-CM | POA: Diagnosis present

## 2024-04-28 DIAGNOSIS — Z8673 Personal history of transient ischemic attack (TIA), and cerebral infarction without residual deficits: Secondary | ICD-10-CM | POA: Diagnosis not present

## 2024-04-28 DIAGNOSIS — I63531 Cerebral infarction due to unspecified occlusion or stenosis of right posterior cerebral artery: Secondary | ICD-10-CM

## 2024-04-28 DIAGNOSIS — E785 Hyperlipidemia, unspecified: Secondary | ICD-10-CM | POA: Diagnosis present

## 2024-04-28 DIAGNOSIS — I951 Orthostatic hypotension: Secondary | ICD-10-CM

## 2024-04-28 DIAGNOSIS — Z6831 Body mass index (BMI) 31.0-31.9, adult: Secondary | ICD-10-CM | POA: Diagnosis not present

## 2024-04-28 DIAGNOSIS — I255 Ischemic cardiomyopathy: Secondary | ICD-10-CM | POA: Diagnosis present

## 2024-04-28 DIAGNOSIS — I340 Nonrheumatic mitral (valve) insufficiency: Secondary | ICD-10-CM | POA: Diagnosis not present

## 2024-04-28 DIAGNOSIS — I6350 Cerebral infarction due to unspecified occlusion or stenosis of unspecified cerebral artery: Secondary | ICD-10-CM | POA: Diagnosis present

## 2024-04-28 DIAGNOSIS — I2510 Atherosclerotic heart disease of native coronary artery without angina pectoris: Secondary | ICD-10-CM | POA: Diagnosis present

## 2024-04-28 DIAGNOSIS — I10 Essential (primary) hypertension: Secondary | ICD-10-CM | POA: Diagnosis not present

## 2024-04-28 DIAGNOSIS — I63431 Cerebral infarction due to embolism of right posterior cerebral artery: Secondary | ICD-10-CM | POA: Diagnosis not present

## 2024-04-28 DIAGNOSIS — K219 Gastro-esophageal reflux disease without esophagitis: Secondary | ICD-10-CM | POA: Diagnosis present

## 2024-04-28 DIAGNOSIS — I358 Other nonrheumatic aortic valve disorders: Secondary | ICD-10-CM | POA: Diagnosis present

## 2024-04-28 DIAGNOSIS — Z820 Family history of epilepsy and other diseases of the nervous system: Secondary | ICD-10-CM | POA: Diagnosis not present

## 2024-04-28 DIAGNOSIS — I5022 Chronic systolic (congestive) heart failure: Secondary | ICD-10-CM | POA: Diagnosis present

## 2024-04-28 DIAGNOSIS — I110 Hypertensive heart disease with heart failure: Secondary | ICD-10-CM | POA: Diagnosis present

## 2024-04-28 DIAGNOSIS — G8194 Hemiplegia, unspecified affecting left nondominant side: Secondary | ICD-10-CM | POA: Diagnosis present

## 2024-04-28 DIAGNOSIS — Z79899 Other long term (current) drug therapy: Secondary | ICD-10-CM | POA: Diagnosis not present

## 2024-04-28 DIAGNOSIS — Z7902 Long term (current) use of antithrombotics/antiplatelets: Secondary | ICD-10-CM | POA: Diagnosis not present

## 2024-04-28 DIAGNOSIS — I998 Other disorder of circulatory system: Secondary | ICD-10-CM

## 2024-04-28 DIAGNOSIS — E6609 Other obesity due to excess calories: Secondary | ICD-10-CM

## 2024-04-28 LAB — CBC
HCT: 37.4 % — ABNORMAL LOW (ref 39.0–52.0)
Hemoglobin: 13.1 g/dL (ref 13.0–17.0)
MCH: 32.2 pg (ref 26.0–34.0)
MCHC: 35 g/dL (ref 30.0–36.0)
MCV: 91.9 fL (ref 80.0–100.0)
Platelets: 236 K/uL (ref 150–400)
RBC: 4.07 MIL/uL — ABNORMAL LOW (ref 4.22–5.81)
RDW: 12.5 % (ref 11.5–15.5)
WBC: 7.4 K/uL (ref 4.0–10.5)
nRBC: 0 % (ref 0.0–0.2)

## 2024-04-28 LAB — LIPID PANEL
Cholesterol: 94 mg/dL (ref 0–200)
HDL: 41 mg/dL (ref >40)
LDL Cholesterol: 42 mg/dL (ref 0–99)
Total CHOL/HDL Ratio: 2.3 ratio
Triglycerides: 54 mg/dL (ref <150)
VLDL: 11 mg/dL (ref 0–40)

## 2024-04-28 LAB — CREATININE, SERUM
Creatinine, Ser: 1.09 mg/dL (ref 0.61–1.24)
GFR, Estimated: >60 mL/min (ref >60)

## 2024-04-28 MED ORDER — SODIUM CHLORIDE 0.9 % IV SOLN: INTRAVENOUS | Status: DC

## 2024-04-28 NOTE — Progress Notes (Signed)
   [facility] has been requested to perform a transesophageal echocardiogram on Michael [name] for evaluation after presenting with acute neurological deficits with imaging suggestive of embolic cause.     The patient does NOT have any absolute or relative contraindications to a Transesophageal Echocardiogram (TEE).  The patient has: No other conditions that may impact this procedure.    After careful review of history and examination, the risks and benefits of transesophageal echocardiogram have been explained including risks of esophageal damage, perforation (1:10,000 risk), bleeding, pharyngeal hematoma as well as other potential complications associated with conscious sedation including aspiration, arrhythmia, respiratory failure and death. Alternatives to treatment were discussed, questions were answered. Patient is willing to proceed.   Bonney Artist Pouch, PA-C  04/28/2024 9:55 AM

## 2024-04-28 NOTE — Care Management Obs Status (Signed)
 MEDICARE OBSERVATION STATUS NOTIFICATION   Patient Details  Name: Michael [name] MRN: [id number] Date of Birth: 01-25-1956   Medicare Observation Status Notification Given:  Yes Obs letter signed and copy given   Claretta Deed 04/28/2024, 3:26 PM

## 2024-04-28 NOTE — Progress Notes (Signed)
 STROKE TEAM PROGRESS NOTE   SUBJECTIVE (INTERVAL HISTORY) No family is at the bedside.  Overall his new symptoms are completely resolved.  He stated that yesterday he was working in from the computer, had sudden onset confusion, delayed thinking process. Lasted about several hours, resolved last night. Denies dizziness or lightheadedness or LOC.  Orthostatic vitals mildly lowering BP with positioning.   OBJECTIVE Temp:  [98 F (36.7 C)-98.3 F (36.8 C)] 98.3 F (36.8 C) (08/07 1551) Pulse Rate:  [58-84] 71 (08/07 1551) Cardiac Rhythm: Normal sinus rhythm (08/07 0700) Resp:  [12-20] 18 (08/07 1551) BP: (103-136)/(55-73) 123/73 (08/07 1551) SpO2:  [94 %-100 %] 100 % (08/07 1551) Weight:  [88.5 kg] 88.5 kg (08/06 2326)  Recent Labs  Lab 04/27/24 1212  GLUCAP 75   Recent Labs  Lab 04/22/24 0315 04/27/24 1214 04/28/24 0227  NA 139 140  --   K 4.1 3.5  --   CL 103 103  --   CO2 27 26  --   GLUCOSE 116* 77  --   BUN 16 16  --   CREATININE 1.11 1.03 1.09  CALCIUM  9.0 9.1  --    Recent Labs  Lab 04/27/24 1214  AST 29  ALT 29  ALKPHOS 84  BILITOT 1.0  PROT 6.4*  ALBUMIN 3.9   Recent Labs  Lab 04/22/24 0315 04/27/24 1214 04/28/24 0227  WBC 6.8 9.2 7.4  NEUTROABS 4.9 6.6  --   HGB 13.6 13.9 13.1  HCT 39.0 39.8 37.4*  MCV 92.6 92.6 91.9  PLT 235 255 236   No results for input(s): CKTOTAL, CKMB, CKMBINDEX, TROPONINI in the last 168 hours. Recent Labs    04/27/24 1214  LABPROT 13.3  INR 1.0   No results for input(s): COLORURINE, LABSPEC, PHURINE, GLUCOSEU, HGBUR, BILIRUBINUR, KETONESUR, PROTEINUR, UROBILINOGEN, NITRITE, LEUKOCYTESUR in the last 72 hours.  Invalid input(s): APPERANCEUR     Component Value Date/Time   CHOL 94 04/28/2024 0227   TRIG 54 04/28/2024 0227   HDL 41 04/28/2024 0227   CHOLHDL 2.3 04/28/2024 0227   VLDL 11 04/28/2024 0227   LDLCALC 42 04/28/2024 0227   LDLCALC 54 08/03/2020 1448   Lab Results   Component Value Date   HGBA1C 5.4 04/21/2024   No results found for: LABOPIA, COCAINSCRNUR, LABBENZ, AMPHETMU, THCU, LABBARB  Recent Labs  Lab 04/27/24 1214  ETH <15    I have personally reviewed the radiological images below and agree with the radiology interpretations.  MR BRAIN WO CONTRAST Result Date: 04/28/2024 EXAM: MRI BRAIN WITHOUT CONTRAST 04/28/2024 12:37:00 AM TECHNIQUE: Multiplanar multisequence MRI of the head/brain was performed without the administration of intravenous contrast. COMPARISON: CT head without contrast 04/27/2024. MR head without contrast 04/21/2024. CLINICAL HISTORY: Neuro deficit, acute, stroke suspected. Crescentic left homonymous hemianopsia. FINDINGS: BRAIN AND VENTRICLES: Focal infarct along the inferior posterior right occipital lobe is again noted. Additional new punctate cortical infarcts are present slightly more laterally and posteriorly in the right occipital lobe. No acute Perihilar infarcts are present. Remote medial right occipital lobe infarct is again seen. T2 and FLAIR signal changes about the previously noted inferior right occipital infarct have increased slightly. No acute hemorrhage is present. Periventricular and other scattered subcortical T2 hyperintensities are mildly advanced for age. These are otherwise stable. No mass. No midline shift. No hydrocephalus. The sella is unremarkable. Normal flow voids. ORBITS: No acute abnormality. SINUSES AND MASTOIDS: No acute abnormality. BONES AND SOFT TISSUES: Normal marrow signal. No acute soft tissue abnormality. IMPRESSION:  1. Additional new punctate cortical infarcts in the right occipital lobe, slightly more lateral and posterior. 2. Focal infarct along the inferior posterior right occipital lobe, again noted, with slightly increased T2/FLAIR signal changes. 3. No acute hemorrhage. 4. Mildly advanced periventricular and other scattered subcortical T2 hyperintensities, stable. This most likely  reflects the sequelae of chronic microvascular ischemia. Electronically signed by: Lonni Necessary MD 04/28/2024 04:22 AM EDT RP Workstation: [id number]   CT HEAD CODE STROKE WO CONTRAST Result Date: 04/27/2024 EXAM: CT HEAD WITHOUT 04/27/2024 12:23:30 PM TECHNIQUE: CT of the head was performed without the administration of intravenous contrast. Automated exposure control, iterative reconstruction, and/or weight based adjustment of the mA/kV was utilized to reduce the radiation dose to as low as reasonably achievable. COMPARISON: CT of the head dated 04/20/2024. CLINICAL HISTORY: Neuro deficit, acute, stroke suspected. LKW- approx 1 hr ago; Left arm/leg tingling, Brain feels weird, can't think straight; Stroke last week; Zackowski- [phone]. FINDINGS: BRAIN AND VENTRICLES: No acute intracranial hemorrhage. No mass effect or midline shift. No extra-axial fluid collection. Gray-white differentiation is maintained. No hydrocephalus. Chronic encephalomalacia changes again demonstrated medially within the right occipital lobe. Aspect score is 10. ORBITS: No acute abnormality. SINUSES AND MASTOIDS: No acute abnormality. SOFT TISSUES AND SKULL: No acute skull fracture. No acute soft tissue abnormality. IMPRESSION: 1. No acute intracranial abnormality. 2. Chronic encephalomalacia changes medially within the right occipital lobe, stable compared to prior study dated 04/20/2024. 3. The above findings were discussed with Dr. Zackowski at 12:28 PM on 04/27/2024. Electronically signed by: evalene coho 04/27/2024 12:32 PM EDT RP Workstation: [id number]   MR BRAIN WO CONTRAST Result Date: 04/21/2024 EXAM: MRI BRAIN WITHOUT CONTRAST 04/21/2024 07:49:04 PM TECHNIQUE: Multiplanar multisequence MRI of the head/brain was performed without the administration of intravenous contrast. COMPARISON: CTA head and neck dated 04/20/2024. CLINICAL HISTORY: Transient ischemic attack (TIA). Vision loss. FINDINGS: BRAIN AND  VENTRICLES: There is an 8 mm focus of restricted diffusion within the inferior right occipital cortex compatible with acute infarct. Additional remote infarct in the medial right occipital lobe. Mild T2/FLAIR hyperintensity in the periventricular and subcortical white matter compatible with mild chronic microvascular ischemic changes. No intracranial hemorrhage. No mass. No midline shift. No hydrocephalus. The sella is unremarkable. Normal flow voids. ORBITS: No acute abnormality. SINUSES AND MASTOIDS: There is mild mucosal thickening in the inferior left maxillary sinus. No acute abnormality in the remaining sinuses and mastoids. BONES AND SOFT TISSUES: Normal marrow signal. No acute soft tissue abnormality. IMPRESSION: 1. Acute infarct in the inferior right occipital cortex. 2. Remote infarct in the medial right occipital lobe. 3. Mild chronic microvascular ischemic changes. Electronically signed by: Donnice Mania MD 04/21/2024 08:42 PM EDT RP Workstation: [id number]   ECHOCARDIOGRAM COMPLETE Result Date: 04/21/2024    ECHOCARDIOGRAM REPORT   Patient Name:   Michael [name] Date of Exam: 04/21/2024 Medical Rec #:  [id number]                 Height:       66.0 in Accession #:    [id number]                Weight:       192.0 lb Date of Birth:  05/08/1956                 BSA:          1.965 m Patient Age:    6 years  BP:           133/74 mmHg Patient Gender: M                         HR:           72 bpm. Exam Location:  Inpatient Procedure: 2D Echo, 3D Echo, Cardiac Doppler, Color Doppler and Strain Analysis            (Both Spectral and Color Flow Doppler were utilized during            procedure). Indications:    TIA  History:        Patient has prior history of Echocardiogram examinations, most                 recent 10/02/2023. CAD; Risk Factors:Hypertension and                 Dyslipidemia.  Sonographer:    Philomena Daring Referring Phys: [id number] VASUNDHRA RATHORE IMPRESSIONS  1. Left  ventricular ejection fraction, by estimation, is 55 to 60%. The left ventricle has normal function. The left ventricle has no regional wall motion abnormalities. Left ventricular diastolic parameters are consistent with Grade I diastolic dysfunction (impaired relaxation).  2. Right ventricular systolic function is normal. The right ventricular size is normal.  3. The mitral valve is normal in structure. Trivial mitral valve regurgitation. No evidence of mitral stenosis.  4. The aortic valve is tricuspid. There is mild calcification of the aortic valve. Aortic valve regurgitation is not visualized. Aortic valve sclerosis/calcification is present, without any evidence of aortic stenosis.  5. Agitated saline contrast bubble study was negative, with no evidence of any interatrial shunt. FINDINGS  Left Ventricle: Left ventricular ejection fraction, by estimation, is 55 to 60%. The left ventricle has normal function. The left ventricle has no regional wall motion abnormalities. Strain was performed and the global longitudinal strain is indeterminate. The left ventricular internal cavity size was normal in size. There is no left ventricular hypertrophy. Left ventricular diastolic parameters are consistent with Grade I diastolic dysfunction (impaired relaxation). Right Ventricle: The right ventricular size is normal. No increase in right ventricular wall thickness. Right ventricular systolic function is normal. Left Atrium: Left atrial size was normal in size. Right Atrium: Right atrial size was normal in size. Pericardium: There is no evidence of pericardial effusion. Mitral Valve: The mitral valve is normal in structure. Trivial mitral valve regurgitation. No evidence of mitral valve stenosis. Tricuspid Valve: The tricuspid valve is normal in structure. Tricuspid valve regurgitation is trivial. No evidence of tricuspid stenosis. Aortic Valve: The aortic valve is tricuspid. There is mild calcification of the aortic valve.  Aortic valve regurgitation is not visualized. Aortic valve sclerosis/calcification is present, without any evidence of aortic stenosis. Pulmonic Valve: The pulmonic valve was normal in structure. Pulmonic valve regurgitation is trivial. No evidence of pulmonic stenosis. Aorta: The aortic root is normal in size and structure. Venous: The inferior vena cava was not well visualized. IAS/Shunts: No atrial level shunt detected by color flow Doppler. Agitated saline contrast was given intravenously to evaluate for intracardiac shunting. Agitated saline contrast bubble study was negative, with no evidence of any interatrial shunt.  LEFT VENTRICLE PLAX 2D LVIDd:         4.75 cm      Diastology LVIDs:         3.98 cm      LV e' medial:    6.09  cm/s LV PW:         1.05 cm      LV E/e' medial:  10.1 LV IVS:        1.03 cm      LV e' lateral:   7.40 cm/s LVOT diam:     2.12 cm      LV E/e' lateral: 8.3 LV SV:         74 LV SV Index:   38 LVOT Area:     3.53 cm  LV Volumes (MOD) LV vol d, MOD A2C: 81.0 ml LV vol d, MOD A4C: 109.0 ml LV vol s, MOD A2C: 39.8 ml LV vol s, MOD A4C: 56.0 ml LV SV MOD A2C:     41.2 ml LV SV MOD A4C:     109.0 ml LV SV MOD BP:      49.1 ml RIGHT VENTRICLE RV S prime:     10.70 cm/s TAPSE (M-mode): 1.8 cm LEFT ATRIUM             Index        RIGHT ATRIUM           Index LA diam:        3.04 cm 1.55 cm/m   RA Area:     12.30 cm LA Vol (A2C):   38.7 ml 19.69 ml/m  RA Volume:   26.00 ml  13.23 ml/m LA Vol (A4C):   36.3 ml 18.47 ml/m LA Biplane Vol: 38.7 ml 19.69 ml/m  AORTIC VALVE LVOT Vmax:   93.80 cm/s LVOT Vmean:  64.500 cm/s LVOT VTI:    0.210 m  AORTA Ao Root diam: 2.52 cm Ao Asc diam:  3.02 cm MITRAL VALVE MV Area (PHT): 3.42 cm     SHUNTS MV Decel Time: 222 msec     Systemic VTI:  0.21 m MV E velocity: 61.30 cm/s   Systemic Diam: 2.12 cm MV A velocity: 115.00 cm/s MV E/A ratio:  0.53 Toribio Fuel MD Electronically signed by Toribio Fuel MD Signature Date/Time: 04/21/2024/10:13:45 AM     Final    CT Angio Head Neck W WO CM Result Date: 04/20/2024 CLINICAL DATA:  Vision loss, monocular EXAM: CT ANGIOGRAPHY HEAD AND NECK WITH AND WITHOUT CONTRAST TECHNIQUE: Multidetector CT imaging of the head and neck was performed using the standard protocol during bolus administration of intravenous contrast. Multiplanar CT image reconstructions and MIPs were obtained to evaluate the vascular anatomy. Carotid stenosis measurements (when applicable) are obtained utilizing NASCET criteria, using the distal internal carotid diameter as the denominator. RADIATION DOSE REDUCTION: This exam was performed according to the departmental dose-optimization program which includes automated exposure control, adjustment of the mA and/or kV according to patient size and/or use of iterative reconstruction technique. CONTRAST:  75mL OMNIPAQUE  IOHEXOL  350 MG/ML SOLN COMPARISON:  None Available. FINDINGS: CT HEAD FINDINGS Brain: Remote right occipital infarct. No evidence of acute large vascular territory infarct, acute hemorrhage, mass lesion or midline shift. Vascular: See below. Skull: No acute fracture. Sinuses/Orbits: Clear sinuses.  No acute orbital findings. Other: No mastoid effusions. Review of the MIP images confirms the above findings CTA NECK FINDINGS Aortic arch: Great vessel origins are patent without proximal hemodynamically significant stenosis. Right carotid system: No evidence of dissection, stenosis (50% or greater), or occlusion. Left carotid system: No evidence of dissection, stenosis (50% or greater), or occlusion. Vertebral arteries: Codominant. No evidence of dissection, stenosis (50% or greater), or occlusion. Skeleton: No evidence of acute fracture. Other neck:  No evidence of acute abnormality. Upper chest: Visualized lung apices are clear. Review of the MIP images confirms the above findings CTA HEAD FINDINGS Anterior circulation: Bilateral intracranial ICAs, MCAs, and ACAs are patent without proximal  hemodynamically significant stenosis. No aneurysm identified. Posterior circulation: Bilateral intradural vertebral arteries, basilar artery and bilateral posterior cerebral arteries are patent without proximal hemodynamically significant stenosis. No aneurysm identified. Venous sinuses: As permitted by contrast timing, patent. Review of the MIP images confirms the above findings IMPRESSION: 1. No large vessel occlusion or proximal hemodynamically significant stenosis. 2. Remote right occipital infarct. Electronically Signed   By: Gilmore GORMAN Molt M.D.   On: 04/20/2024 19:23     PHYSICAL EXAM  Temp:  [98 F (36.7 C)-98.3 F (36.8 C)] 98.3 F (36.8 C) (08/07 1551) Pulse Rate:  [58-84] 71 (08/07 1551) Resp:  [12-20] 18 (08/07 1551) BP: (103-136)/(55-73) 123/73 (08/07 1551) SpO2:  [94 %-100 %] 100 % (08/07 1551) Weight:  [88.5 kg] 88.5 kg (08/06 2326)  General - Well nourished, well developed, in no apparent distress.  Ophthalmologic - fundi not visualized due to noncooperation.  Cardiovascular - Regular rhythm and rate.  Mental Status -  Level of arousal and orientation to time, place, and person were intact. Language including expression, naming, repetition, comprehension was assessed and found intact. Attention span and concentration were normal. Fund of Knowledge was assessed and was intact.  Cranial Nerves II - XII - II - Visual field intact OD. At [facility], patchy anopia at left lower quadrant.  III, IV, VI - Extraocular movements intact. V - Facial sensation intact bilaterally. VII - Facial movement intact bilaterally. VIII - Hearing & vestibular intact bilaterally. X - Palate elevates symmetrically. XI - Chin turning & shoulder shrug intact bilaterally. XII - Tongue protrusion intact.  Motor Strength - The patient's strength was normal in all extremities and pronator drift was absent.  Bulk was normal and fasciculations were absent.   Motor Tone - Muscle tone was assessed at  the neck and appendages and was normal.  Reflexes - The patient's reflexes were symmetrical in all extremities and he had no pathological reflexes.  Sensory - Light touch, temperature/pinprick were assessed and were symmetrical.    Coordination - The patient had normal movements in the hands and feet with no ataxia or dysmetria.  Tremor was absent.  Gait and Station - deferred.   ASSESSMENT/PLAN Michael [name] is a 68 y.o. male with history of hypertension, hyperlipidemia, CAD status post stenting, recent stroke admitted for confusion, mental status change. No TNK given due to recent stroke.    Stroke:  new additional punctate infarct adjacent to previous R PCA small infarct, likely secondary to BP fluctuation and mild orthostatic hypotension CT no acute abnormality MRI showed bulging recent right PCA infarct with additional adjacent punctate infarct TEE pending 30-day cardiac event monitoring ongoing LDL 42 Lovenox  for VTE prophylaxis aspirin  81 mg daily and Brilinta  (ticagrelor ) 90 mg bid prior to admission, now on home aspirin  81 mg daily and Brilinta  (ticagrelor ) 90 mg bid.  Patient counseled to be compliant with his antithrombotic medications Ongoing aggressive stroke risk factor management Therapy recommendations: None Disposition: Pending  Recent stroke 04/21/2024 admitted for small right PCA infarct.  CT head and neck unremarkable.  EF 55 to 60%.  LDL 121, A1c 5.4.  Patient refused loop recorder and Librexia trial, discharged on aspirin  and Brilinta  and Lipitor  80 and Zetia  as well as 30-day CardioNet monitoring  Hypertension Slight orthostatic hypotension BP  lying 120/67, sitting 115/73, standing at 0 minutes 110/64, standing 3 minutes 105/69 On gentle IV fluid BP stable now Long term BP goal normotensive  Hyperlipidemia Home meds: Lipitor  80 and Zetia  10 LDL 121->42, goal < 70 Now continue Lipitor  80 and Zetia  10 Continue statin at discharge  Other  Stroke Risk Factors Advanced age CAD status post stenting, on DAPT and Imdur  Obesity, Body mass index is 31.51 kg/m.   Other Active Problems   Hospital day # 0    Ary Cummins, MD PhD Stroke Neurology 04/28/2024 4:12 PM    To contact Stroke Continuity provider, please refer to [email or web address]. After hours, contact General Neurology

## 2024-04-28 NOTE — Evaluation (Signed)
 SLP Cancellation Note  Patient Details Name: Michael [name] MRN: [id number] DOB: 1956/03/03   Cancelled treatment:       Reason Eval/Treat Not Completed: Other (comment) (attempted to see pt but he was working with MD at the time; will continue efforts) Madelin POUR, MS [facility] SLP Acute Rehab Services Office [phone]   Nicolas Emmie Caldron 04/28/2024, 11:02 AM

## 2024-04-28 NOTE — TOC CAGE-AID Note (Signed)
 Transition of Care [facility]) - CAGE-AID Screening   Patient Details  Name: Michael [name] MRN: [id number] Date of Birth: Mar 05, 1956  Transition of Care [facility] CM/SW Contact:    Jaylene Schrom E Noah Pelaez, LCSW Phone Number: 04/28/2024, 8:49 AM   Clinical Narrative: No SA noted.   CAGE-AID Screening:    Have You Ever Felt You Ought to Cut Down on Your Drinking or Drug Use?: No Have People Annoyed You By Critizing Your Drinking Or Drug Use?: No Have You Felt Bad Or Guilty About Your Drinking Or Drug Use?: No Have You Ever Had a Drink or Used Drugs First Thing In The Morning to Steady Your Nerves or to Get Rid of a Hangover?: No CAGE-AID Score: 0  Substance Abuse Education Offered: No

## 2024-04-28 NOTE — Plan of Care (Signed)

## 2024-04-28 NOTE — Progress Notes (Signed)
 PROGRESS NOTE  Michael [name] FMW:[id number] DOB: [birth year]   PCP: [name], MD  Patient is from: Home.  Independently ambulates at baseline.  DOA: 04/27/2024 LOS: 0  Chief complaints Chief Complaint  Patient presents with   Code Stroke     Brief Narrative / Interim history: 68 year old M with PMH of CAD/stenting in 2017, HFimpEF, HTN, HLD, BPH and recent hospitalization from 7/30-8/1 with acute right occipital lobe CVA manifesting with visual disturbance/quadrantanopsia returns with left visual field and a speech deficit, mild confusion and left-sided numbness and weakness.  CT head without acute finding.  Neurology consulted.  MRI brain showed additional new punctate cortical infarct in the right occipital lobe slightly more lateral and posterior and slightly increased T2-FLAIR signal changes of the previous right occipital lobe infarct without hemorrhage.  Patient is continued on home aspirin , Lipitor  and Brilinta .  TEE ordered.  Subjective: Seen and examined earlier this morning.  No major events overnight or this morning.  Reports improvement in his left visual deficit.  No new neurosymptoms.  Denies chest pain or dyspnea.  Objective: Vitals:   04/27/24 2302 04/27/24 2326 04/28/24 0747 04/28/24 1118  BP:  136/63 128/69 123/65  Pulse:  62 63 84  Resp:  16 20 18   Temp: 98.1 F (36.7 C) 98.2 F (36.8 C) 98 F (36.7 C) 98.2 F (36.8 C)  TempSrc: Oral Oral Oral Oral  SpO2:  97% 99% 99%  Weight:  88.5 kg    Height:  5' 5.98 (1.676 m)      Examination:  GENERAL: No apparent distress.  Nontoxic. HEENT: MMM.  Vision and hearing grossly intact.  NECK: Supple.  No apparent JVD.  RESP:  No IWOB.  Fair aeration bilaterally. CVS:  RRR. Heart sounds normal.  ABD/GI/GU: BS+. Abd soft, NTND.  MSK/EXT:  Moves extremities. No apparent deformity. No edema.  SKIN: no apparent skin lesion or wound NEURO: Awake, alert and oriented appropriately. Speech clear.  Cranial nerves II-XII intact. Motor 5/5 in all muscle groups of UE and LE bilaterally, Normal tone. Light sensation intact in all dermatomes of upper and lower ext bilaterally. Patellar reflex symmetric.  No pronator drift.  Finger to nose intact. PSYCH: Calm. Normal affect.   Consultants:  Neurology Cardiology  Procedures: None  Microbiology summarized: None  Assessment and plan: Recurrent right occipital CVA: Hospitalized from 7/30-8/1 with right occipital CVA and discharged on aspirin , Brilinta  and Lipitor .  Presents with acute left visual deficit, speech change, confusion and left-sided numbness and weakness.  MRI shows acute right occipital infarct.  Recent CT angio head and neck without LVO or hemodynamically significant stenosis.  Recent echo without significant finding.  Symptoms seem to have resolved. -Neurology recommended TEE-cardiology consulted and will arrange -Per cardiology, cannot interrogate the event monitor yet. -Continue Brilinta , aspirin , Lipitor  and Zetia  -PT/OT/SLP eval  History of CAD s/p stent in 2017.  LHC in 09/2023 with nonobstructive CAD and patent stents.  No cardiopulmonary symptoms. -On aspirin , Lipitor , Brilinta , Zetia , Imdur  and Ranexa   Chronic HFimpEF/ICM: Stable.  Not on diuretics.  Essential hypertension: Normotensive.  Orthostatic vitals negative -Continue home Imdur    Hyperlipidemia:  -Continue with statin   History of BPH: No urinary symptoms.   -Continue home regimen.  Class I obesity Body mass index is 31.51 kg/m.          DVT prophylaxis:  enoxaparin  (LOVENOX ) injection 40 mg Start: 04/28/24 1000  Code Status: Full code Family Communication: None at bedside Level of care: Telemetry  Medical Status is: Observation The patient will require care spanning > 2 midnights and should be moved to inpatient because: Acute recurrent occipital CVA   Final disposition: Home   55 minutes with more than 50% spent in reviewing records,  counseling patient/family and coordinating care.   Sch Meds:  Scheduled Meds:  aspirin  EC  81 mg Oral Daily   atorvastatin   80 mg Oral Daily   enoxaparin  (LOVENOX ) injection  40 mg Subcutaneous Q24H   ezetimibe   10 mg Oral Daily   isosorbide  mononitrate  60 mg Oral Daily   ranolazine   500 mg Oral BID   sodium chloride  flush  3 mL Intravenous Once   ticagrelor   60 mg Oral BID   [START ON 04/29/2024] vitamin B-12  500 mcg Oral QODAY   Continuous Infusions:  sodium chloride  40 mL/hr at 04/28/24 0108   [START ON 04/29/2024] sodium chloride      PRN Meds:.acetaminophen  **OR** acetaminophen  (TYLENOL ) oral liquid 160 mg/5 mL **OR** acetaminophen , famotidine , senna-docusate  Antimicrobials: Anti-infectives (From admission, onward)    None        I have personally reviewed the following labs and images: CBC: Recent Labs  Lab 04/22/24 0315 04/27/24 1214 04/28/24 0227  WBC 6.8 9.2 7.4  NEUTROABS 4.9 6.6  --   HGB 13.6 13.9 13.1  HCT 39.0 39.8 37.4*  MCV 92.6 92.6 91.9  PLT 235 255 236   BMP &GFR Recent Labs  Lab 04/22/24 0315 04/27/24 1214 04/28/24 0227  NA 139 140  --   K 4.1 3.5  --   CL 103 103  --   CO2 27 26  --   GLUCOSE 116* 77  --   BUN 16 16  --   CREATININE 1.11 1.03 1.09  CALCIUM  9.0 9.1  --    Estimated Creatinine Clearance: 66.7 mL/min (by C-G formula based on SCr of 1.09 mg/dL). Liver & Pancreas: Recent Labs  Lab 04/27/24 1214  AST 29  ALT 29  ALKPHOS 84  BILITOT 1.0  PROT 6.4*  ALBUMIN 3.9   No results for input(s): LIPASE, AMYLASE in the last 168 hours. No results for input(s): AMMONIA in the last 168 hours. Diabetic: No results for input(s): HGBA1C in the last 72 hours. Recent Labs  Lab 04/27/24 1212  GLUCAP 75   Cardiac Enzymes: No results for input(s): CKTOTAL, CKMB, CKMBINDEX, TROPONINI in the last 168 hours. No results for input(s): PROBNP in the last 8760 hours. Coagulation Profile: Recent Labs  Lab  04/27/24 1214  INR 1.0   Thyroid  Function Tests: No results for input(s): TSH, T4TOTAL, FREET4, T3FREE, THYROIDAB in the last 72 hours. Lipid Profile: Recent Labs    04/28/24 0227  CHOL 94  HDL 41  LDLCALC 42  TRIG 54  CHOLHDL 2.3   Anemia Panel: No results for input(s): VITAMINB12, FOLATE, FERRITIN, TIBC, IRON, RETICCTPCT in the last 72 hours. Urine analysis: No results found for: COLORURINE, APPEARANCEUR, LABSPEC, PHURINE, GLUCOSEU, HGBUR, BILIRUBINUR, KETONESUR, PROTEINUR, UROBILINOGEN, NITRITE, LEUKOCYTESUR Sepsis Labs: Invalid input(s): PROCALCITONIN, LACTICIDVEN  Microbiology: No results found for this or any previous visit (from the past 240 hours).  Radiology Studies: MR BRAIN WO CONTRAST Result Date: 04/28/2024 EXAM: MRI BRAIN WITHOUT CONTRAST 04/28/2024 12:37:00 AM TECHNIQUE: Multiplanar multisequence MRI of the head/brain was performed without the administration of intravenous contrast. COMPARISON: CT head without contrast 04/27/2024. MR head without contrast 04/21/2024. CLINICAL HISTORY: Neuro deficit, acute, stroke suspected. Crescentic left homonymous hemianopsia. FINDINGS: BRAIN AND VENTRICLES: Focal infarct along the inferior  posterior right occipital lobe is again noted. Additional new punctate cortical infarcts are present slightly more laterally and posteriorly in the right occipital lobe. No acute Perihilar infarcts are present. Remote medial right occipital lobe infarct is again seen. T2 and FLAIR signal changes about the previously noted inferior right occipital infarct have increased slightly. No acute hemorrhage is present. Periventricular and other scattered subcortical T2 hyperintensities are mildly advanced for age. These are otherwise stable. No mass. No midline shift. No hydrocephalus. The sella is unremarkable. Normal flow voids. ORBITS: No acute abnormality. SINUSES AND MASTOIDS: No acute abnormality. BONES  AND SOFT TISSUES: Normal marrow signal. No acute soft tissue abnormality. IMPRESSION: 1. Additional new punctate cortical infarcts in the right occipital lobe, slightly more lateral and posterior. 2. Focal infarct along the inferior posterior right occipital lobe, again noted, with slightly increased T2/FLAIR signal changes. 3. No acute hemorrhage. 4. Mildly advanced periventricular and other scattered subcortical T2 hyperintensities, stable. This most likely reflects the sequelae of chronic microvascular ischemia. Electronically signed by: Lonni Necessary MD 04/28/2024 04:22 AM EDT RP Workstation: [id number]   CT HEAD CODE STROKE WO CONTRAST Result Date: 04/27/2024 EXAM: CT HEAD WITHOUT 04/27/2024 12:23:30 PM TECHNIQUE: CT of the head was performed without the administration of intravenous contrast. Automated exposure control, iterative reconstruction, and/or weight based adjustment of the mA/kV was utilized to reduce the radiation dose to as low as reasonably achievable. COMPARISON: CT of the head dated 04/20/2024. CLINICAL HISTORY: Neuro deficit, acute, stroke suspected. LKW- approx 1 hr ago; Left arm/leg tingling, Brain feels weird, can't think straight; Stroke last week; Zackowski- [phone]. FINDINGS: BRAIN AND VENTRICLES: No acute intracranial hemorrhage. No mass effect or midline shift. No extra-axial fluid collection. Gray-white differentiation is maintained. No hydrocephalus. Chronic encephalomalacia changes again demonstrated medially within the right occipital lobe. Aspect score is 10. ORBITS: No acute abnormality. SINUSES AND MASTOIDS: No acute abnormality. SOFT TISSUES AND SKULL: No acute skull fracture. No acute soft tissue abnormality. IMPRESSION: 1. No acute intracranial abnormality. 2. Chronic encephalomalacia changes medially within the right occipital lobe, stable compared to prior study dated 04/20/2024. 3. The above findings were discussed with Dr. Zackowski at 12:28 PM on  04/27/2024. Electronically signed by: evalene coho 04/27/2024 12:32 PM EDT RP Workstation: [id number]      Khaleem Burchill T. Chai Verdejo [facility]  If 7PM-7AM, please contact night-coverage [email or web address] 04/28/2024, 11:25 AM

## 2024-04-28 NOTE — Progress Notes (Signed)
 Obtained the consent form and placed it in pt's chart.   Lawson Radar, RN

## 2024-04-28 NOTE — H&P (View-Only) (Signed)
   [facility] has been requested to perform a transesophageal echocardiogram on Michael [name] for evaluation after presenting with acute neurological deficits with imaging suggestive of embolic cause.     The patient does NOT have any absolute or relative contraindications to a Transesophageal Echocardiogram (TEE).  The patient has: No other conditions that may impact this procedure.    After careful review of history and examination, the risks and benefits of transesophageal echocardiogram have been explained including risks of esophageal damage, perforation (1:10,000 risk), bleeding, pharyngeal hematoma as well as other potential complications associated with conscious sedation including aspiration, arrhythmia, respiratory failure and death. Alternatives to treatment were discussed, questions were answered. Patient is willing to proceed.   Bonney Artist Pouch, PA-C  04/28/2024 9:55 AM

## 2024-04-28 NOTE — Evaluation (Signed)
 Speech Language Pathology Evaluation Patient Details Name: Michael [name] MRN: [id number] DOB: [birth year] Today's Date: 04/28/2024 Time: 8874-8858 SLP Time Calculation (min) (ACUTE ONLY): 16 min  Problem List:  Patient Active Problem List   Diagnosis Date Noted   Stroke (HCC) 04/27/2024   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   GERD (gastroesophageal reflux disease) 04/21/2024   Partial nontraumatic tear of right rotator cuff 03/09/2024   Essential hypertension 12/20/2023   Mixed hyperlipidemia 10/01/2023   Benign hypertension 10/01/2023   Acute bursitis of right shoulder 08/25/2023   Achilles tendinosis of left ankle 07/13/2023   SI (sacroiliac) joint dysfunction 09/02/2022   Gluteal tendinitis of right buttock 03/04/2022   Coronary artery disease with stable angina pectoris, unspecified vessel or lesion type, unspecified whether native or transplanted heart (HCC) 02/06/2022   Degenerative arthritis of right knee 11/15/2021   Cervical disc disorder with radiculopathy of cervical region 03/15/2021   Left shoulder pain 01/24/2021   AC (acromioclavicular) arthritis 01/24/2021   Abnormal chest x-ray 01/24/2021   Right carpal tunnel syndrome 09/05/2020   Perianal irritation 06/08/2020   Ischemic cardiomyopathy 12/05/2019   History of adenomatous polyp of colon 06/07/2018   BPH (benign prostatic hyperplasia) 06/07/2018   Vitiligo 06/07/2018   Gluteal pain 04/10/2018   Back pain 04/13/2017   Nonallopathic lesion of thoracic region 04/13/2017   Nonallopathic lesion of sacral region 04/13/2017   Nonallopathic lesion of lumbosacral region 04/13/2017   Impaired glucose tolerance 11/03/2016   Unstable angina (HCC)    Obesity    HTN (hypertension)    CAD S/P multiple PCIs 09/08/2014   Dyslipidemia 09/08/2014   Past Medical History:  Past Medical History:  Diagnosis Date   Acute lateral meniscal tear, right, initial encounter 09/26/2021   Injection given September 26, 2018.    CAD S/P PCI to Cx & RCA    a. s/p PCI of LCx (2006) and ostium of RCA (2007) as well as DES to RCA in [place] (05/2014)   HTN (hypertension)    Hyperlipidemia    Myocardial infarction (HCC) 05/29/2011   Plantar fasciitis 10/12/2017   Past Surgical History:  Past Surgical History:  Procedure Laterality Date   CARDIAC CATHETERIZATION N/A 05/06/2016   Procedure: Left Heart Cath and Coronary Angiography;  Surgeon: Lonni Hanson, MD;  Location: [facility] INVASIVE CV LAB;  Service: Cardiovascular;  Laterality: N/A;   CARDIAC CATHETERIZATION N/A 05/06/2016   Procedure: Coronary Stent Intervention;  Surgeon: Lonni Hanson, MD;  Location: [facility] INVASIVE CV LAB;  Service: Cardiovascular;  Laterality: N/A;   CARDIAC CATHETERIZATION N/A 05/13/2016   Procedure: Coronary/Graft Angiography;  Surgeon: Peter M Swaziland, MD;  Location[facility] INVASIVE CV LAB;  Service: Cardiovascular;  Laterality: N/A;   CORONARY ANGIOPLASTY WITH STENT PLACEMENT  09/22/2004   in [place] , occluded CFX, s/p Taxus stent   CORONARY ANGIOPLASTY WITH STENT PLACEMENT  09/22/2005   in [place] , ostial RCA stent   CORONARY ANGIOPLASTY WITH STENT PLACEMENT  09/22/2013   in [place] , RCA stent   CORONARY PRESSURE/FFR STUDY N/A 10/02/2023   Procedure: CORONARY PRESSURE/FFR STUDY;  Surgeon: Wendel Lurena POUR, MD;  Location: [facility] INVASIVE CV LAB;  Service: Cardiovascular;  Laterality: N/A;   LEFT HEART CATH AND CORONARY ANGIOGRAPHY N/A 10/02/2023   Procedure: LEFT HEART CATH AND CORONARY ANGIOGRAPHY;  Surgeon: Wendel Lurena POUR, MD;  Location: [facility] INVASIVE CV LAB;  Service: Cardiovascular;  Laterality: N/A;   TONSILLECTOMY AND ADENOIDECTOMY      HPI:  68yo male admitted 04/27/24 with recurrent stroke  sx including left vision loss, speech deficits, mild confusion, left numbness/weakness. PMH: recent CVA (DC'd 04/22/24) with residual homonymous hemianopsia, HTN, HLD, HFrEF, MI, right rotator cuff tear, GERD, CAD, PCI. MRI - addt'l new punctate  cortical infarcts right occipital lobe, focal infarct inferior posterior right occipital lobe.   Assessment / Plan / Recommendation Clinical Impression  Pt seen at bedside for cognitive linguistic evaluation. He reports having 13 years of education, in addition to [employer] schooling. Pt was driving prior to his last CVA, and reports independence with finances and medications. Speech and Language appear intact. The [employer] Mental Status (SLUMS) Examination was administered. Pt scored 26/30, raising concern for mild neurocognitive deficits. Points were lost on clock drawing task (2 hands drawn same length), and delayed recall (3/5 words recalled without cues).   Pt did report he has noticed changes in his functional memory even prior to his first CVA, but reports has has compensatory strategies in place to maximize functional recall. Pt was encouraged to have his wife assist with management of finances and medications to insure accuracy (for a period of time), and notify PCP if difficulties arise upon return to normal routines. No further acute ST needs identified at this time.    SLP Assessment  SLP Recommendation/Assessment: All further Speech Language Pathology needs can be addressed in the next venue of care if needs arise.  SLP Visit Diagnosis: Cognitive communication deficit (R41.841)     Assistance Recommended at Discharge  Intermittent Supervision/Assistance  Functional Status Assessment Patient has not had a recent decline in their functional status     SLP Evaluation Cognition  Overall Cognitive Status: Within Functional Limits for tasks assessed Arousal/Alertness: Awake/alert Orientation Level: Oriented X4       Comprehension  Auditory Comprehension Overall Auditory Comprehension: Appears within functional limits for tasks assessed    Expression Expression Primary Mode of Expression: Verbal Verbal Expression Overall Verbal Expression: Appears within functional  limits for tasks assessed Written Expression Dominant Hand: Right   Oral / Motor  Oral Motor/Sensory Function Overall Oral Motor/Sensory Function: Within functional limits Motor Speech Overall Motor Speech: Appears within functional limits for tasks assessed Intelligibility: Intelligible           Adamari Frede B. Dory, MSP, CCC-SLP Speech Language Pathologist Office: [phone]  Dory Caprice Daring 04/28/2024, 11:52 AM

## 2024-04-28 NOTE — Evaluation (Signed)
 Physical Therapy Evaluation Patient Details Name: Michael [name] MRN: [id number] DOB: 1956-07-30 Today's Date: 04/28/2024  History of Present Illness  68 y.o. male presents to [facility] 04/27/24 with recurrent symptoms of L sided vision loss, speech deficits, mild confusion, and L sided numbness/weakness. 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 CT head with no acute abnormalities. Suspected recurrent stroke like symptoms referred to R cerebral hemisphere, occipital, and motor strip and possible L hemispheric localization, possible embolic causes. PMH: CAD, HTN, HLD, cardiac cath, CVA, R rotator cuff tear   Clinical Impression  Pt in bed upon arrival and agreeable to PT eval. PTA, pt was independent for mobility with no AD. Pt presented to therapy eval at functional mobility baseline with ability to ambulate 120 ft independently with no AD. Pt scored a 22/24 on the DGI indicating that he is not at an increased risk of falling. Pt has no further acute or post-acute PT needs. He will have 24/7 assist as needed. Acute PT signing off.          If plan is discharge home, recommend the following: Assist for transportation   Can travel by private vehicle   Yes     Equipment Recommendations None recommended by PT     Functional Status Assessment Patient has not had a recent decline in their functional status     Precautions / Restrictions Precautions Precautions: None Recall of Precautions/Restrictions: Intact Restrictions Weight Bearing Restrictions Per Provider Order: No      Mobility  Bed Mobility Overal bed mobility: Independent   Transfers Overall transfer level: Independent Equipment used: None   Ambulation/Gait Ambulation/Gait assistance: Independent Gait Distance (Feet): 120 Feet Assistive device: None Gait Pattern/deviations: WFL(Within Functional Limits)         Modified Rankin (Stroke Patients Only) Modified Rankin (Stroke Patients Only) Pre-Morbid Rankin  Score: Slight disability Modified Rankin: Slight disability     Balance Overall balance assessment: Independent      Standardized Balance Assessment Standardized Balance Assessment : Dynamic Gait Index   Dynamic Gait Index Level Surface: Normal Change in Gait Speed: Normal Gait with Horizontal Head Turns: Normal Gait with Vertical Head Turns: Normal Gait and Pivot Turn: Moderate Impairment Step Over Obstacle: Normal Step Around Obstacles: Normal Steps: Normal Total Score: 22       Pertinent Vitals/Pain Pain Assessment Pain Assessment: No/denies pain    Home Living Family/patient expects to be discharged to:: Private residence Living Arrangements: Spouse/significant other;Children Available Help at Discharge: Family;Available 24 hours/day Type of Home: House Home Access: Stairs to enter Entrance Stairs-Rails: Right Entrance Stairs-Number of Steps: 3-4   Home Layout: One level Home Equipment: None Additional Comments: adult children live with pt and spouse; one with CP and one with high functioning autism    Prior Function Prior Level of Function : Independent/Modified Independent;Driving      Mobility Comments: no AD ADLs Comments: Completely independent. Not able to return to the gym or drive since prior hospital visit     Extremity/Trunk Assessment   Upper Extremity Assessment Upper Extremity Assessment: Defer to OT evaluation RUE Deficits / Details: reports 3 month issue with shoulder. imaging showing tears and tendenosis. Pt reports already following up OP with this. AROM WFL and able to use UE functionally without issues    Lower Extremity Assessment Lower Extremity Assessment: Overall WFL for tasks assessed    Cervical / Trunk Assessment Cervical / Trunk Assessment: Normal  Communication   Communication Communication: No apparent difficulties    Cognition Arousal:  Alert Behavior During Therapy: WFL for tasks assessed/performed   PT - Cognitive  impairments: No apparent impairments      Following commands: Intact       Cueing Cueing Techniques: Verbal cues     General Comments General comments (skin integrity, edema, etc.): VSS on RA     PT Assessment Patient does not need any further PT services   AM-PAC PT 6 Clicks Mobility  Outcome Measure Help needed turning from your back to your side while in a flat bed without using bedrails?: None Help needed moving from lying on your back to sitting on the side of a flat bed without using bedrails?: None Help needed moving to and from a bed to a chair (including a wheelchair)?: None Help needed standing up from a chair using your arms (e.g., wheelchair or bedside chair)?: None Help needed to walk in hospital room?: None Help needed climbing 3-5 steps with a railing? : A Little 6 Click Score: 23    End of Session   Activity Tolerance: Patient tolerated treatment well Patient left: in bed;with call bell/phone within reach Nurse Communication: Mobility status PT Visit Diagnosis: Other abnormalities of gait and mobility (R26.89)    Time: 9176-9158 PT Time Calculation (min) (ACUTE ONLY): 18 min   Charges:   PT Evaluation $PT Eval Low Complexity: 1 Low   PT General Charges $$ ACUTE PT VISIT: 1 Visit       Kate ORN, PT, DPT Secure Chat Preferred  Rehab Office [phone]   Kate BRAVO Wendolyn 04/28/2024, 10:31 AM

## 2024-04-28 NOTE — Evaluation (Signed)
 Occupational Therapy Evaluation and DC Summary Patient Details Name: Michael [name] MRN: [id number] DOB: 06-Jul-1956 Today's Date: 04/28/2024   History of Present Illness   68 y.o. male presents to [facility] 04/27/24 with recurrent symptoms of L sided vision loss, speech deficits, mild confusion, and L sided numbness/weakness. 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 CT head with no acute abnormalities. Suspected recurrent stroke like symptoms referred to R cerebral hemisphere, occipital, and motor strip and possible L hemispheric localization, possible embolic causes. PMH: CAD, HTN, HLD, cardiac cath, CVA, R rotator cuff tear     Clinical Impressions Pt admitted for above, PTA pt lived with spouse and reports being ind with ADLs/iADLs and ind with ADLs. Pt currently presenting at functional baseline, ind completing Adls and room level mobility, no visual or UE/LE deficits noted on eval. Bp stable, pt has no further acute skilled OT needs. No post acute OT recommended.      If plan is discharge home, recommend the following:         Functional Status Assessment   Patient has not had a recent decline in their functional status     Equipment Recommendations   None recommended by OT     Recommendations for Other Services         Precautions/Restrictions   Precautions Precautions: None Recall of Precautions/Restrictions: Intact Restrictions Weight Bearing Restrictions Per Provider Order: No     Mobility Bed Mobility Overal bed mobility: Independent                  Transfers Overall transfer level: Independent Equipment used: None                      Balance Overall balance assessment: No apparent balance deficits (not formally assessed)                                         ADL either performed or assessed with clinical judgement   ADL Overall ADL's : Independent                                        General ADL Comments: toilet transfer, standing hygiene ind in room     Vision Baseline Vision/History: 1 Wears glasses Ability to See in Adequate Light: 0 Adequate Patient Visual Report: No change from baseline Vision Assessment?: Yes;No apparent visual deficits Eye Alignment: Within Functional Limits Ocular Range of Motion: Within Functional Limits Alignment/Gaze Preference: Within Defined Limits Tracking/Visual Pursuits: Able to track stimulus in all quads without difficulty Saccades: Within functional limits Convergence: Within functional limits Visual Fields: No apparent deficits Diplopia Assessment: Other (comment) (denies)     Perception Perception: Within Functional Limits       Praxis Praxis: WFL       Pertinent Vitals/Pain Pain Assessment Pain Assessment: No/denies pain     Extremity/Trunk Assessment Upper Extremity Assessment Upper Extremity Assessment: Overall WFL for tasks assessed RUE Deficits / Details: reports 3 month issue with shoulder. imaging showing tears and tendenosis. Pt reports already following up OP with this. AROM WFL and able to use UE functionally without issues   Lower Extremity Assessment Lower Extremity Assessment: Overall WFL for tasks assessed   Cervical / Trunk Assessment Cervical / Trunk Assessment: Normal   Communication Communication Communication: No  apparent difficulties   Cognition Arousal: Alert Behavior During Therapy: WFL for tasks assessed/performed Cognition: No apparent impairments                               Following commands: Intact       Cueing  General Comments   Cueing Techniques: Verbal cues  VSS on RA   Exercises     Shoulder Instructions      Home Living Family/patient expects to be discharged to:: Private residence Living Arrangements: Spouse/significant other;Children Available Help at Discharge: Family;Available 24 hours/day Type of Home: House Home Access: Stairs to  enter [employer] of Steps: 3-4 Entrance Stairs-Rails: Right Home Layout: One level     Bathroom Shower/Tub: Producer, television/film/video: Standard     Home Equipment: None   Additional Comments: adult children live with pt and spouse; one with CP and one with high functioning autism      Prior Functioning/Environment Prior Level of Function : Independent/Modified Independent;Driving             Mobility Comments: no AD ADLs Comments: Completely independent. Not able to return to the gym or drive since prior hospital visit    OT Problem List: Other (comment) (resolved visual and cognitive deficits)   OT Treatment/Interventions:        OT Goals(Current goals can be found in the care plan section)   Acute Rehab OT Goals OT Goal Formulation: All assessment and education complete, DC therapy Potential to Achieve Goals: Good   OT Frequency:       Co-evaluation              AM-PAC OT 6 Clicks Daily Activity     Outcome Measure Help from another person eating meals?: None Help from another person taking care of personal grooming?: None Help from another person toileting, which includes using toliet, bedpan, or urinal?: None Help from another person bathing (including washing, rinsing, drying)?: None Help from another person to put on and taking off regular upper body clothing?: None Help from another person to put on and taking off regular lower body clothing?: None 6 Click Score: 24   End of Session Nurse Communication: Mobility status  Activity Tolerance: Patient tolerated treatment well Patient left: in bed;with call bell/phone within reach  OT Visit Diagnosis: Low vision, both eyes (H54.2);Other symptoms and signs involving cognitive function                Time: 9049-8987 OT Time Calculation (min): 22 min Charges:  OT General Charges $OT Visit: 1 Visit OT Evaluation $OT Eval Low Complexity: 1 Low  04/28/2024  AB, OTR/L  Acute  Rehabilitation Services  Office: [phone]   Curtistine JONETTA Das 04/28/2024, 10:22 AM

## 2024-04-28 NOTE — TOC Transition Note (Signed)
 Transition of Care [facility]) - Discharge Note   Patient Details  Name: Michael [name] MRN: [id number] Date of Birth: [birth year]  Transition [facility]) CM/SW Contact:  Andrez JULIANNA George, RN Phone Number: 04/28/2024, 11:07 AM   Clinical Narrative:     Pt is discharging home with self care. No follow up per PT/OT.  Pt has no DME at home.  Wife has been providing needed transportation. She will transport home today. Pt manages his own medications and denies any issues.    Final next level of care: Home/Self Care Barriers to Discharge: No Barriers Identified   Patient Goals and CMS Choice            Discharge Placement                       Discharge Plan and Services Additional resources added to the After Visit Summary for                                       Social Drivers of Health (SDOH) Interventions SDOH Screenings   Food Insecurity: No Food Insecurity (04/28/2024)  Housing: Low Risk  (04/28/2024)  Transportation Needs: No Transportation Needs (04/28/2024)  Utilities: Not At Risk (04/28/2024)  Alcohol Screen: Low Risk  (04/04/2024)  Depression (PHQ2-9): Low Risk  (04/27/2024)  Financial Resource Strain: Low Risk  (04/04/2024)  Physical Activity: Sufficiently Active (04/04/2024)  Social Connections: Moderately Integrated (04/28/2024)  Stress: No Stress Concern Present (04/04/2024)  Tobacco Use: Low Risk  (04/27/2024)  Health Literacy: Adequate Health Literacy (04/04/2024)     Readmission Risk Interventions     No data to display

## 2024-04-29 ENCOUNTER — Inpatient Hospital Stay (HOSPITAL_COMMUNITY): Admitting: Anesthesiology

## 2024-04-29 ENCOUNTER — Encounter (HOSPITAL_COMMUNITY)
Admission: EM | Disposition: A | Discharge status: Home / Self Care | Payer: Self-pay | Source: Home / Self Care | Attending: Student

## 2024-04-29 ENCOUNTER — Inpatient Hospital Stay (HOSPITAL_COMMUNITY)

## 2024-04-29 ENCOUNTER — Encounter (HOSPITAL_COMMUNITY): Payer: Self-pay | Admitting: Cardiovascular Disease

## 2024-04-29 DIAGNOSIS — I340 Nonrheumatic mitral (valve) insufficiency: Secondary | ICD-10-CM

## 2024-04-29 DIAGNOSIS — G459 Transient cerebral ischemic attack, unspecified: Secondary | ICD-10-CM

## 2024-04-29 DIAGNOSIS — I25118 Atherosclerotic heart disease of native coronary artery with other forms of angina pectoris: Secondary | ICD-10-CM

## 2024-04-29 DIAGNOSIS — I63531 Cerebral infarction due to unspecified occlusion or stenosis of right posterior cerebral artery: Secondary | ICD-10-CM | POA: Diagnosis not present

## 2024-04-29 DIAGNOSIS — I10 Essential (primary) hypertension: Secondary | ICD-10-CM

## 2024-04-29 DIAGNOSIS — I639 Cerebral infarction, unspecified: Secondary | ICD-10-CM

## 2024-04-29 DIAGNOSIS — E785 Hyperlipidemia, unspecified: Secondary | ICD-10-CM | POA: Diagnosis not present

## 2024-04-29 DIAGNOSIS — I63431 Cerebral infarction due to embolism of right posterior cerebral artery: Secondary | ICD-10-CM | POA: Diagnosis not present

## 2024-04-29 DIAGNOSIS — I951 Orthostatic hypotension: Secondary | ICD-10-CM | POA: Diagnosis not present

## 2024-04-29 DIAGNOSIS — I998 Other disorder of circulatory system: Secondary | ICD-10-CM | POA: Diagnosis not present

## 2024-04-29 LAB — ECHO TEE

## 2024-04-29 SURGERY — TRANSESOPHAGEAL ECHOCARDIOGRAM (TEE) (CATHLAB)
Anesthesia: Monitor Anesthesia Care

## 2024-04-29 MED ORDER — PROPOFOL 10 MG/ML IV BOLUS
INTRAVENOUS | Status: DC | PRN
  Administered 2024-04-29: 50 mg via INTRAVENOUS

## 2024-04-29 MED ORDER — LIDOCAINE 2% (20 MG/ML) 5 ML SYRINGE
INTRAMUSCULAR | Status: DC | PRN
Start: 2024-04-29 — End: 2024-04-29
  Administered 2024-04-29: 60 mg via INTRAVENOUS

## 2024-04-29 MED ORDER — SODIUM CHLORIDE 0.9 % IV SOLN: INTRAVENOUS | Status: DC | PRN

## 2024-04-29 MED ORDER — PROPOFOL 500 MG/50ML IV EMUL
INTRAVENOUS | Status: DC | PRN
  Administered 2024-04-29: 125 ug/kg/min via INTRAVENOUS

## 2024-04-29 NOTE — Progress Notes (Signed)
D/c tele and IV. Went over AVS with pt and all questions were addressed.   Lavenia Atlas, RN

## 2024-04-29 NOTE — Discharge Summary (Signed)
 Physician Discharge Summary  Michael [name] FMW:[id number] DOB: Jun 04, 1956 DOA: 04/27/2024  PCP: Michael Garnette KIDD, MD  Admit date: 04/27/2024 Discharge date: 04/29/2024  Admitted From: Home Disposition:  Home  Recommendations for Outpatient Follow-up:  Follow up with Neurology in 1-2 weeks Please obtain BMP/CBC in one week   Home Health:No Equipment/Devices:None  Discharge Condition:Stable CODE STATUS:Full Diet recommendation: Heart Healthy  Brief/Interim Summary: 68 y.o. male past medical history of CAD with PCI in 2017 HFpEF, essential hypertension, BPH recently discharged on 04/22/2024 for an acute right occipital CVA returns with left visual field and speech deficits, along with confusion and left-sided numbness CTA of the head showed no acute findings MRI of the brain showing additional new punctuated cortical infarct on the right occipital lobe.  Neurology was consulted   Discharge Diagnoses:  Principal Problem:   Stroke [facility]) Active Problems:   CAD S/P multiple PCIs   Dyslipidemia   Obesity   Essential hypertension   Acute CVA (cerebrovascular accident) (HCC)  Recurrent CVA right occipital lobe: CT angio of the head and neck showed no LVO no hemodynamically significant stenosis. Neurology was consulted recommended a TEE that was done on 04/29/2024 that showed no evidence of emboli. 30-day cardiac monitor was placed. He will continue aspirin  and Brilinta  per cardiology and neurology. Continue statin and Setia. PT OT evaluated the patient, he will need no home health PT.  History of CAD status post stent in 2017: Continue aspirin  Lipitor , Brilinta  Setia and Ranexa .  Chronic systolic heart failure/ischemic cardiomyopathy: Stable no changes made to his medication.  Central pretension: No changes made to his medication.  Hyperlipidemia: Continue statins.  History of BPH: Continue his home regimen no changes made.   Discharge Instructions  Discharge  Instructions     Ambulatory referral to Neurology   Complete by: As directed    Follow up with stroke clinic NP at [facility] in about 4-6 weeks. Thanks.   Diet - low sodium heart healthy   Complete by: As directed    Increase activity slowly   Complete by: As directed       Allergies as of 04/29/2024   No Known Allergies      Medication List     TAKE these medications    aspirin  81 MG tablet Take 81 mg by mouth daily.   atorvastatin  80 MG tablet Commonly known as: LIPITOR  TAKE 1 TABLET DAILY   ezetimibe  10 MG tablet Commonly known as: Zetia  Take 1 tablet (10 mg total) by mouth daily.   famotidine  40 MG tablet Commonly known as: PEPCID  Take 1 tablet (40 mg total) by mouth daily. What changed:  when to take this reasons to take this   isosorbide  mononitrate 60 MG 24 hr tablet Commonly known as: IMDUR  TAKE 1 TABLET DAILY   Q-Sorb Co Q-10 100 MG capsule Generic drug: Coenzyme Q10 Take 1 capsule by mouth daily.   ranolazine  500 MG 12 hr tablet Commonly known as: Ranexa  Take 1 tablet (500 mg total) by mouth 2 (two) times daily.   ticagrelor  60 MG Tabs tablet Commonly known as: Brilinta  Take 1 tablet (60 mg total) by mouth 2 (two) times daily.   VITAMIN B-12 PO Take 1 tablet by mouth every other day.        Follow-up Information     [facility]. Schedule an appointment as soon as possible for a visit in 1 month(s).   Specialty: Neurology Why: stroke clinic Contact information: [address] [place]  [id number] [phone]               No Known Allergies  Consultations: Neurology   Procedures/Studies: ECHO TEE Result Date: 04/29/2024    TRANSESOPHOGEAL ECHO REPORT   Patient Name:   Michael [name] Date of Exam: 04/29/2024 Medical Rec #:  [id number]                 Height:       66.0 in Accession #:    [id number]                Weight:       195.1 lb Date of Birth:  14-Jul-1956                  BSA:          1.979 m Patient Age:    68 years                  BP:           127/67 mmHg Patient Gender: M                         HR:           62 bpm. Exam Location:  Inpatient Procedure: Transesophageal Echo, Limited Color Doppler, Cardiac Doppler and            Saline Contrast Bubble Study (Both Spectral and Color Flow Doppler            were utilized during procedure). Indications:     TIA  History:         Patient has prior history of Echocardiogram examinations, most                  recent 04/21/2024. CAD, TIA; Risk Factors:Hypertension and                  Dyslipidemia.  Sonographer:     Michael [name] RDCS Referring Phys:  [id number] Michael [name] Diagnosing Phys: Michael Emmer MD PROCEDURE: After discussion of the risks and benefits of a TEE, an informed consent was obtained from the patient. The transesophogeal probe was passed without difficulty through the esophogus of the patient. Imaged were obtained with the patient in a left lateral decubitus position. Sedation performed by different physician. The patient was monitored while under deep sedation. Anesthestetic sedation was provided intravenously by Anesthesiology: 128mg  of Propofol , 60mg  of Lidocaine . Image quality was good. The patient's vital signs; including heart rate, blood pressure, and oxygen saturation; remained stable throughout the procedure. The patient developed no complications during the procedure.  IMPRESSIONS  1. Now cardiac source of embolus.  2. Left ventricular ejection fraction, by estimation, is 60 to 65%. The left ventricle has normal function. The left ventricle has no regional wall motion abnormalities.  3. Right ventricular systolic function is normal. The right ventricular size is normal.  4. Left atrial size was mildly dilated. No left atrial/left atrial appendage thrombus was detected.  5. The mitral valve is abnormal. Mild mitral valve regurgitation. No evidence of mitral stenosis.  6. The aortic valve is  tricuspid. There is mild calcification of the aortic valve. Aortic valve regurgitation is not visualized. Aortic valve sclerosis is present, with no evidence of aortic valve stenosis.  7. The inferior vena cava is normal in size with greater than 50% respiratory variability, suggesting right atrial pressure of 3 mmHg.  8. Agitated  saline contrast bubble study was negative, with no evidence of any interatrial shunt.  9. None and demonstrates None. Conclusion(s)/Recommendation(s): Normal biventricular function without evidence of hemodynamically significant valvular heart disease. FINDINGS  Left Ventricle: Left ventricular ejection fraction, by estimation, is 60 to 65%. The left ventricle has normal function. The left ventricle has no regional wall motion abnormalities. The left ventricular internal cavity size was normal in size. There is  no left ventricular hypertrophy. Right Ventricle: The right ventricular size is normal. No increase in right ventricular wall thickness. Right ventricular systolic function is normal. Left Atrium: Left atrial size was mildly dilated. No left atrial/left atrial appendage thrombus was detected. Right Atrium: Right atrial size was normal in size. Pericardium: There is no evidence of pericardial effusion. Mitral Valve: The mitral valve is abnormal. There is mild thickening of the mitral valve leaflet(s). Mild mitral valve regurgitation. No evidence of mitral valve stenosis. Tricuspid Valve: The tricuspid valve is normal in structure. Tricuspid valve regurgitation is not demonstrated. No evidence of tricuspid stenosis. Aortic Valve: The aortic valve is tricuspid. There is mild calcification of the aortic valve. Aortic valve regurgitation is not visualized. Aortic valve sclerosis is present, with no evidence of aortic valve stenosis. Pulmonic Valve: The pulmonic valve was normal in structure. Pulmonic valve regurgitation is not visualized. No evidence of pulmonic stenosis. Aorta: The  aortic root is normal in size and structure. Venous: The inferior vena cava is normal in size with greater than 50% respiratory variability, suggesting right atrial pressure of 3 mmHg. IAS/Shunts: No atrial level shunt detected by color flow Doppler. Agitated saline contrast was given intravenously to evaluate for intracardiac shunting. Agitated saline contrast bubble study was negative, with no evidence of any interatrial shunt. Additional Comments: Now cardiac source of embolus. 3D was performed not requiring image post processing on an independent workstation and was indeterminate. Spectral Doppler performed. LEFT VENTRICLE PLAX 2D LVOT diam:     2.00 cm LVOT Area:     3.14 cm   AORTA Ao Root diam: 3.10 cm Ao Asc diam:  2.90 cm  SHUNTS Systemic Diam: 2.00 cm Michael Emmer MD Electronically signed by Michael Emmer MD Signature Date/Time: 04/29/2024/1:37:36 PM    Final    EP STUDY Result Date: 04/29/2024 See surgical note for result.  MR BRAIN WO CONTRAST Result Date: 04/28/2024 EXAM: MRI BRAIN WITHOUT CONTRAST 04/28/2024 12:37:00 AM TECHNIQUE: Multiplanar multisequence MRI of the head/brain was performed without the administration of intravenous contrast. COMPARISON: CT head without contrast 04/27/2024. MR head without contrast 04/21/2024. CLINICAL HISTORY: Neuro deficit, acute, stroke suspected. Crescentic left homonymous hemianopsia. FINDINGS: BRAIN AND VENTRICLES: Focal infarct along the inferior posterior right occipital lobe is again noted. Additional new punctate cortical infarcts are present slightly more laterally and posteriorly in the right occipital lobe. No acute Perihilar infarcts are present. Remote medial right occipital lobe infarct is again seen. T2 and FLAIR signal changes about the previously noted inferior right occipital infarct have increased slightly. No acute hemorrhage is present. Periventricular and other scattered subcortical T2 hyperintensities are mildly advanced for age. These are  otherwise stable. No mass. No midline shift. No hydrocephalus. The sella is unremarkable. Normal flow voids. ORBITS: No acute abnormality. SINUSES AND MASTOIDS: No acute abnormality. BONES AND SOFT TISSUES: Normal marrow signal. No acute soft tissue abnormality. IMPRESSION: 1. Additional new punctate cortical infarcts in the right occipital lobe, slightly more lateral and posterior. 2. Focal infarct along the inferior posterior right occipital lobe, again noted, with slightly increased  T2/FLAIR signal changes. 3. No acute hemorrhage. 4. Mildly advanced periventricular and other scattered subcortical T2 hyperintensities, stable. This most likely reflects the sequelae of chronic microvascular ischemia. Electronically signed by: Lonni Necessary MD 04/28/2024 04:22 AM EDT RP Workstation: [id number]   CT HEAD CODE STROKE WO CONTRAST Result Date: 04/27/2024 EXAM: CT HEAD WITHOUT 04/27/2024 12:23:30 PM TECHNIQUE: CT of the head was performed without the administration of intravenous contrast. Automated exposure control, iterative reconstruction, and/or weight based adjustment of the mA/kV was utilized to reduce the radiation dose to as low as reasonably achievable. COMPARISON: CT of the head dated 04/20/2024. CLINICAL HISTORY: Neuro deficit, acute, stroke suspected. LKW- approx 1 hr ago; Left arm/leg tingling, Brain feels weird, can't think straight; Stroke last week; Zackowski- [phone]. FINDINGS: BRAIN AND VENTRICLES: No acute intracranial hemorrhage. No mass effect or midline shift. No extra-axial fluid collection. Gray-white differentiation is maintained. No hydrocephalus. Chronic encephalomalacia changes again demonstrated medially within the right occipital lobe. Aspect score is 10. ORBITS: No acute abnormality. SINUSES AND MASTOIDS: No acute abnormality. SOFT TISSUES AND SKULL: No acute skull fracture. No acute soft tissue abnormality. IMPRESSION: 1. No acute intracranial abnormality. 2. Chronic  encephalomalacia changes medially within the right occipital lobe, stable compared to prior study dated 04/20/2024. 3. The above findings were discussed with Dr. Zackowski at 12:28 PM on 04/27/2024. Electronically signed by: evalene coho 04/27/2024 12:32 PM EDT RP Workstation: [id number]   MR BRAIN WO CONTRAST Result Date: 04/21/2024 EXAM: MRI BRAIN WITHOUT CONTRAST 04/21/2024 07:49:04 PM TECHNIQUE: Multiplanar multisequence MRI of the head/brain was performed without the administration of intravenous contrast. COMPARISON: CTA head and neck dated 04/20/2024. CLINICAL HISTORY: Transient ischemic attack (TIA). Vision loss. FINDINGS: BRAIN AND VENTRICLES: There is an 8 mm focus of restricted diffusion within the inferior right occipital cortex compatible with acute infarct. Additional remote infarct in the medial right occipital lobe. Mild T2/FLAIR hyperintensity in the periventricular and subcortical white matter compatible with mild chronic microvascular ischemic changes. No intracranial hemorrhage. No mass. No midline shift. No hydrocephalus. The sella is unremarkable. Normal flow voids. ORBITS: No acute abnormality. SINUSES AND MASTOIDS: There is mild mucosal thickening in the inferior left maxillary sinus. No acute abnormality in the remaining sinuses and mastoids. BONES AND SOFT TISSUES: Normal marrow signal. No acute soft tissue abnormality. IMPRESSION: 1. Acute infarct in the inferior right occipital cortex. 2. Remote infarct in the medial right occipital lobe. 3. Mild chronic microvascular ischemic changes. Electronically signed by: Donnice Mania MD 04/21/2024 08:42 PM EDT RP Workstation: [id number]   ECHOCARDIOGRAM COMPLETE Result Date: 04/21/2024    ECHOCARDIOGRAM REPORT   Patient Name:   FRANCHOT POLLITT Date of Exam: 04/21/2024 Medical Rec #:  [id number]                 Height:       66.0 in Accession #:    [id number]                Weight:       192.0 lb Date of Birth:  01/12/1956                  BSA:          1.965 m Patient Age:    68 years                  BP:           133/74 mmHg Patient Gender: M  HR:           72 bpm. Exam Location:  Inpatient Procedure: 2D Echo, 3D Echo, Cardiac Doppler, Color Doppler and Strain Analysis            (Both Spectral and Color Flow Doppler were utilized during            procedure). Indications:    TIA  History:        Patient has prior history of Echocardiogram examinations, most                 recent 10/02/2023. CAD; Risk Factors:Hypertension and                 Dyslipidemia.  Sonographer:    Philomena Daring Referring Phys: [id number] VASUNDHRA RATHORE IMPRESSIONS  1. Left ventricular ejection fraction, by estimation, is 55 to 60%. The left ventricle has normal function. The left ventricle has no regional wall motion abnormalities. Left ventricular diastolic parameters are consistent with Grade I diastolic dysfunction (impaired relaxation).  2. Right ventricular systolic function is normal. The right ventricular size is normal.  3. The mitral valve is normal in structure. Trivial mitral valve regurgitation. No evidence of mitral stenosis.  4. The aortic valve is tricuspid. There is mild calcification of the aortic valve. Aortic valve regurgitation is not visualized. Aortic valve sclerosis/calcification is present, without any evidence of aortic stenosis.  5. Agitated saline contrast bubble study was negative, with no evidence of any interatrial shunt. FINDINGS  Left Ventricle: Left ventricular ejection fraction, by estimation, is 55 to 60%. The left ventricle has normal function. The left ventricle has no regional wall motion abnormalities. Strain was performed and the global longitudinal strain is indeterminate. The left ventricular internal cavity size was normal in size. There is no left ventricular hypertrophy. Left ventricular diastolic parameters are consistent with Grade I diastolic dysfunction (impaired relaxation). Right Ventricle: The  right ventricular size is normal. No increase in right ventricular wall thickness. Right ventricular systolic function is normal. Left Atrium: Left atrial size was normal in size. Right Atrium: Right atrial size was normal in size. Pericardium: There is no evidence of pericardial effusion. Mitral Valve: The mitral valve is normal in structure. Trivial mitral valve regurgitation. No evidence of mitral valve stenosis. Tricuspid Valve: The tricuspid valve is normal in structure. Tricuspid valve regurgitation is trivial. No evidence of tricuspid stenosis. Aortic Valve: The aortic valve is tricuspid. There is mild calcification of the aortic valve. Aortic valve regurgitation is not visualized. Aortic valve sclerosis/calcification is present, without any evidence of aortic stenosis. Pulmonic Valve: The pulmonic valve was normal in structure. Pulmonic valve regurgitation is trivial. No evidence of pulmonic stenosis. Aorta: The aortic root is normal in size and structure. Venous: The inferior vena cava was not well visualized. IAS/Shunts: No atrial level shunt detected by color flow Doppler. Agitated saline contrast was given intravenously to evaluate for intracardiac shunting. Agitated saline contrast bubble study was negative, with no evidence of any interatrial shunt.  LEFT VENTRICLE PLAX 2D LVIDd:         4.75 cm      Diastology LVIDs:         3.98 cm      LV e' medial:    6.09 cm/s LV PW:         1.05 cm      LV E/e' medial:  10.1 LV IVS:        1.03 cm      LV e'  lateral:   7.40 cm/s LVOT diam:     2.12 cm      LV E/e' lateral: 8.3 LV SV:         74 LV SV Index:   38 LVOT Area:     3.53 cm  LV Volumes (MOD) LV vol d, MOD A2C: 81.0 ml LV vol d, MOD A4C: 109.0 ml LV vol s, MOD A2C: 39.8 ml LV vol s, MOD A4C: 56.0 ml LV SV MOD A2C:     41.2 ml LV SV MOD A4C:     109.0 ml LV SV MOD BP:      49.1 ml RIGHT VENTRICLE RV S prime:     10.70 cm/s TAPSE (M-mode): 1.8 cm LEFT ATRIUM             Index        RIGHT ATRIUM            Index LA diam:        3.04 cm 1.55 cm/m   RA Area:     12.30 cm LA Vol (A2C):   38.7 ml 19.69 ml/m  RA Volume:   26.00 ml  13.23 ml/m LA Vol (A4C):   36.3 ml 18.47 ml/m LA Biplane Vol: 38.7 ml 19.69 ml/m  AORTIC VALVE LVOT Vmax:   93.80 cm/s LVOT Vmean:  64.500 cm/s LVOT VTI:    0.210 m  AORTA Ao Root diam: 2.52 cm Ao Asc diam:  3.02 cm MITRAL VALVE MV Area (PHT): 3.42 cm     SHUNTS MV Decel Time: 222 msec     Systemic VTI:  0.21 m MV E velocity: 61.30 cm/s   Systemic Diam: 2.12 cm MV A velocity: 115.00 cm/s MV E/A ratio:  0.53 Toribio Fuel MD Electronically signed by Toribio Fuel MD Signature Date/Time: 04/21/2024/10:13:45 AM    Final    CT Angio Head Neck W WO CM Result Date: 04/20/2024 CLINICAL DATA:  Vision loss, monocular EXAM: CT ANGIOGRAPHY HEAD AND NECK WITH AND WITHOUT CONTRAST TECHNIQUE: Multidetector CT imaging of the head and neck was performed using the standard protocol during bolus administration of intravenous contrast. Multiplanar CT image reconstructions and MIPs were obtained to evaluate the vascular anatomy. Carotid stenosis measurements (when applicable) are obtained utilizing NASCET criteria, using the distal internal carotid diameter as the denominator. RADIATION DOSE REDUCTION: This exam was performed according to the departmental dose-optimization program which includes automated exposure control, adjustment of the mA and/or kV according to patient size and/or use of iterative reconstruction technique. CONTRAST:  75mL OMNIPAQUE  IOHEXOL  350 MG/ML SOLN COMPARISON:  None Available. FINDINGS: CT HEAD FINDINGS Brain: Remote right occipital infarct. No evidence of acute large vascular territory infarct, acute hemorrhage, mass lesion or midline shift. Vascular: See below. Skull: No acute fracture. Sinuses/Orbits: Clear sinuses.  No acute orbital findings. Other: No mastoid effusions. Review of the MIP images confirms the above findings CTA NECK FINDINGS Aortic arch: Great vessel  origins are patent without proximal hemodynamically significant stenosis. Right carotid system: No evidence of dissection, stenosis (50% or greater), or occlusion. Left carotid system: No evidence of dissection, stenosis (50% or greater), or occlusion. Vertebral arteries: Codominant. No evidence of dissection, stenosis (50% or greater), or occlusion. Skeleton: No evidence of acute fracture. Other neck: No evidence of acute abnormality. Upper chest: Visualized lung apices are clear. Review of the MIP images confirms the above findings CTA HEAD FINDINGS Anterior circulation: Bilateral intracranial ICAs, MCAs, and ACAs are patent without proximal hemodynamically significant stenosis. No  aneurysm identified. Posterior circulation: Bilateral intradural vertebral arteries, basilar artery and bilateral posterior cerebral arteries are patent without proximal hemodynamically significant stenosis. No aneurysm identified. Venous sinuses: As permitted by contrast timing, patent. Review of the MIP images confirms the above findings IMPRESSION: 1. No large vessel occlusion or proximal hemodynamically significant stenosis. 2. Remote right occipital infarct. Electronically Signed   By: Gilmore GORMAN Molt M.D.   On: 04/20/2024 19:23   (Echo, Carotid, EGD, Colonoscopy, ERCP)    Subjective: No complaints  Discharge Exam: Vitals:   04/29/24 1313 04/29/24 1404  BP: 111/73 118/73  Pulse: 66 73  Resp: 14 17  Temp:  (!) 97.4 F (36.3 C)  SpO2: 95% 99%   Vitals:   04/29/24 1016 04/29/24 1303 04/29/24 1313 04/29/24 1404  BP: 133/80 104/70 111/73 118/73  Pulse: 67 67 66 73  Resp: 18 20 14 17   Temp: 97.6 F (36.4 C) (!) 97.5 F (36.4 C)  (!) 97.4 F (36.3 C)  TempSrc: Temporal Tympanic  Oral  SpO2: 99% 96% 95% 99%  Weight:      Height:        General: Pt is alert, awake, not in acute distress Cardiovascular: RRR, S1/S2 +, no rubs, no gallops Respiratory: CTA bilaterally, no wheezing, no rhonchi Abdominal:  Soft, NT, ND, bowel sounds + Extremities: no edema, no cyanosis    The results of significant diagnostics from this hospitalization (including imaging, microbiology, ancillary and laboratory) are listed below for reference.     Microbiology: No results found for this or any previous visit (from the past 240 hours).   Labs: BNP (last 3 results) No results for input(s): BNP in the last 8760 hours. Basic Metabolic Panel: Recent Labs  Lab 04/27/24 1214 04/28/24 0227  NA 140  --   K 3.5  --   CL 103  --   CO2 26  --   GLUCOSE 77  --   BUN 16  --   CREATININE 1.03 1.09  CALCIUM  9.1  --    Liver Function Tests: Recent Labs  Lab 04/27/24 1214  AST 29  ALT 29  ALKPHOS 84  BILITOT 1.0  PROT 6.4*  ALBUMIN 3.9   No results for input(s): LIPASE, AMYLASE in the last 168 hours. No results for input(s): AMMONIA in the last 168 hours. CBC: Recent Labs  Lab 04/27/24 1214 04/28/24 0227  WBC 9.2 7.4  NEUTROABS 6.6  --   HGB 13.9 13.1  HCT 39.8 37.4*  MCV 92.6 91.9  PLT 255 236   Cardiac Enzymes: No results for input(s): CKTOTAL, CKMB, CKMBINDEX, TROPONINI in the last 168 hours. BNP: Invalid input(s): POCBNP CBG: Recent Labs  Lab 04/27/24 1212  GLUCAP 75   D-Dimer No results for input(s): DDIMER in the last 72 hours. Hgb A1c No results for input(s): HGBA1C in the last 72 hours. Lipid Profile Recent Labs    04/28/24 0227  CHOL 94  HDL 41  LDLCALC 42  TRIG 54  CHOLHDL 2.3   Thyroid  function studies No results for input(s): TSH, T4TOTAL, T3FREE, THYROIDAB in the last 72 hours.  Invalid input(s): FREET3 Anemia work up No results for input(s): VITAMINB12, FOLATE, FERRITIN, TIBC, IRON, RETICCTPCT in the last 72 hours. Urinalysis No results found for: COLORURINE, APPEARANCEUR, LABSPEC, PHURINE, GLUCOSEU, HGBUR, BILIRUBINUR, KETONESUR, PROTEINUR, UROBILINOGEN, NITRITE, LEUKOCYTESUR Sepsis  Labs Recent Labs  Lab 04/27/24 1214 04/28/24 0227  WBC 9.2 7.4   Microbiology No results found for this or any previous visit (from the past  240 hours).   Time coordinating discharge: Over 35 minutes  SIGNED:   Erle Odell Castor, MD  [facility] 04/29/2024, 2:40 PM Pager   If 7PM-7AM, please contact night-coverage [email or web address] Password [email or web address]

## 2024-04-29 NOTE — Progress Notes (Signed)
  Echocardiogram Echocardiogram Transesophageal has been performed.  Koleen KANDICE Popper, RDCS 04/29/2024, 1:07 PM

## 2024-04-29 NOTE — Transfer of Care (Signed)
 Immediate Anesthesia Transfer of Care Note  Patient: Michael [name]  Procedure(s) Performed: TRANSESOPHAGEAL ECHOCARDIOGRAM  Patient Location: Cath Lab  Anesthesia Type:MAC  Level of Consciousness: sedated and drowsy  Airway & Oxygen Therapy: Patient Spontanous Breathing  Post-op Assessment: Report given to RN and Post -op Vital signs reviewed and stable  Post vital signs: Reviewed and stable  Last Vitals:  Vitals Value Taken Time  BP 104/70 04/29/24 13:03  Temp 36.4 C 04/29/24 13:03  Pulse 68 04/29/24 13:05  Resp 14 04/29/24 13:05  SpO2 95 % 04/29/24 13:05  Vitals shown include unfiled device data.  Last Pain:  Vitals:   04/29/24 1303  TempSrc: Tympanic  PainSc: Asleep         Complications: No notable events documented.

## 2024-04-29 NOTE — Progress Notes (Signed)
 Pt came back to rm 15 from cath lab. Reinitiated tele. Obtained vs. Call bell within reach.   Amado GORMAN Arabia, RN

## 2024-04-29 NOTE — Progress Notes (Signed)
 STROKE TEAM PROGRESS NOTE   SUBJECTIVE (INTERVAL HISTORY) No family is at the bedside.  Overall his new symptoms are completely resolved. He had TEE toady unremarkable. Continue ASA and brilinta .    OBJECTIVE Temp:  [97.4 F (36.3 C)-98.4 F (36.9 C)] 97.4 F (36.3 C) (08/08 1404) Pulse Rate:  [59-73] 73 (08/08 1404) Cardiac Rhythm: Normal sinus rhythm (08/08 0700) Resp:  [14-20] 17 (08/08 1404) BP: (99-133)/(43-80) 118/73 (08/08 1404) SpO2:  [95 %-99 %] 99 % (08/08 1404)  Recent Labs  Lab 04/27/24 1212  GLUCAP 75   Recent Labs  Lab 04/27/24 1214 04/28/24 0227  NA 140  --   K 3.5  --   CL 103  --   CO2 26  --   GLUCOSE 77  --   BUN 16  --   CREATININE 1.03 1.09  CALCIUM  9.1  --    Recent Labs  Lab 04/27/24 1214  AST 29  ALT 29  ALKPHOS 84  BILITOT 1.0  PROT 6.4*  ALBUMIN 3.9   Recent Labs  Lab 04/27/24 1214 04/28/24 0227  WBC 9.2 7.4  NEUTROABS 6.6  --   HGB 13.9 13.1  HCT 39.8 37.4*  MCV 92.6 91.9  PLT 255 236   No results for input(s): CKTOTAL, CKMB, CKMBINDEX, TROPONINI in the last 168 hours. Recent Labs    04/27/24 1214  LABPROT 13.3  INR 1.0   No results for input(s): COLORURINE, LABSPEC, PHURINE, GLUCOSEU, HGBUR, BILIRUBINUR, KETONESUR, PROTEINUR, UROBILINOGEN, NITRITE, LEUKOCYTESUR in the last 72 hours.  Invalid input(s): APPERANCEUR     Component Value Date/Time   CHOL 94 04/28/2024 0227   TRIG 54 04/28/2024 0227   HDL 41 04/28/2024 0227   CHOLHDL 2.3 04/28/2024 0227   VLDL 11 04/28/2024 0227   LDLCALC 42 04/28/2024 0227   LDLCALC 54 08/03/2020 1448   Lab Results  Component Value Date   HGBA1C 5.4 04/21/2024   No results found for: LABOPIA, COCAINSCRNUR, LABBENZ, AMPHETMU, THCU, LABBARB  Recent Labs  Lab 04/27/24 1214  ETH <15    I have personally reviewed the radiological images below and agree with the radiology interpretations.  ECHO TEE Result Date: 04/29/2024     TRANSESOPHOGEAL ECHO REPORT   Patient Name:   Michael [name] Date of Exam: 04/29/2024 Medical Rec #:  [id number]                 Height:       66.0 in Accession #:    [id number]                Weight:       195.1 lb Date of Birth:  [birth year]                 BSA:          1.979 m Patient Age:    68 years                  BP:           127/67 mmHg Patient Gender: M                         HR:           62 bpm. Exam Location:  Inpatient Procedure: Transesophageal Echo, Limited Color Doppler, Cardiac Doppler and            Saline Contrast Bubble Study (Both Spectral and Color Flow Doppler  were utilized during procedure). Indications:     TIA  History:         Patient has prior history of Echocardiogram examinations, most                  recent 04/21/2024. CAD, TIA; Risk Factors:Hypertension and                  Dyslipidemia.  Sonographer:     Koleen Popper RDCS Referring Phys:  [id number] ARTIST POUCH Diagnosing Phys: Maude Emmer MD PROCEDURE: After discussion of the risks and benefits of a TEE, an informed consent was obtained from the patient. The transesophogeal probe was passed without difficulty through the esophogus of the patient. Imaged were obtained with the patient in a left lateral decubitus position. Sedation performed by different physician. The patient was monitored while under deep sedation. Anesthestetic sedation was provided intravenously by Anesthesiology: 128mg  of Propofol , 60mg  of Lidocaine . Image quality was good. The patient's vital signs; including heart rate, blood pressure, and oxygen saturation; remained stable throughout the procedure. The patient developed no complications during the procedure.  IMPRESSIONS  1. Now cardiac source of embolus.  2. Left ventricular ejection fraction, by estimation, is 60 to 65%. The left ventricle has normal function. The left ventricle has no regional wall motion abnormalities.  3. Right ventricular systolic function is normal. The right  ventricular size is normal.  4. Left atrial size was mildly dilated. No left atrial/left atrial appendage thrombus was detected.  5. The mitral valve is abnormal. Mild mitral valve regurgitation. No evidence of mitral stenosis.  6. The aortic valve is tricuspid. There is mild calcification of the aortic valve. Aortic valve regurgitation is not visualized. Aortic valve sclerosis is present, with no evidence of aortic valve stenosis.  7. The inferior vena cava is normal in size with greater than 50% respiratory variability, suggesting right atrial pressure of 3 mmHg.  8. Agitated saline contrast bubble study was negative, with no evidence of any interatrial shunt.  9. None and demonstrates None. Conclusion(s)/Recommendation(s): Normal biventricular function without evidence of hemodynamically significant valvular heart disease. FINDINGS  Left Ventricle: Left ventricular ejection fraction, by estimation, is 60 to 65%. The left ventricle has normal function. The left ventricle has no regional wall motion abnormalities. The left ventricular internal cavity size was normal in size. There is  no left ventricular hypertrophy. Right Ventricle: The right ventricular size is normal. No increase in right ventricular wall thickness. Right ventricular systolic function is normal. Left Atrium: Left atrial size was mildly dilated. No left atrial/left atrial appendage thrombus was detected. Right Atrium: Right atrial size was normal in size. Pericardium: There is no evidence of pericardial effusion. Mitral Valve: The mitral valve is abnormal. There is mild thickening of the mitral valve leaflet(s). Mild mitral valve regurgitation. No evidence of mitral valve stenosis. Tricuspid Valve: The tricuspid valve is normal in structure. Tricuspid valve regurgitation is not demonstrated. No evidence of tricuspid stenosis. Aortic Valve: The aortic valve is tricuspid. There is mild calcification of the aortic valve. Aortic valve regurgitation  is not visualized. Aortic valve sclerosis is present, with no evidence of aortic valve stenosis. Pulmonic Valve: The pulmonic valve was normal in structure. Pulmonic valve regurgitation is not visualized. No evidence of pulmonic stenosis. Aorta: The aortic root is normal in size and structure. Venous: The inferior vena cava is normal in size with greater than 50% respiratory variability, suggesting right atrial pressure of 3 mmHg. IAS/Shunts: No atrial level shunt detected  by color flow Doppler. Agitated saline contrast was given intravenously to evaluate for intracardiac shunting. Agitated saline contrast bubble study was negative, with no evidence of any interatrial shunt. Additional Comments: Now cardiac source of embolus. 3D was performed not requiring image post processing on an independent workstation and was indeterminate. Spectral Doppler performed. LEFT VENTRICLE PLAX 2D LVOT diam:     2.00 cm LVOT Area:     3.14 cm   AORTA Ao Root diam: 3.10 cm Ao Asc diam:  2.90 cm  SHUNTS Systemic Diam: 2.00 cm Maude Emmer MD Electronically signed by Maude Emmer MD Signature Date/Time: 04/29/2024/1:37:36 PM    Final    EP STUDY Result Date: 04/29/2024 See surgical note for result.  MR BRAIN WO CONTRAST Result Date: 04/28/2024 EXAM: MRI BRAIN WITHOUT CONTRAST 04/28/2024 12:37:00 AM TECHNIQUE: Multiplanar multisequence MRI of the head/brain was performed without the administration of intravenous contrast. COMPARISON: CT head without contrast 04/27/2024. MR head without contrast 04/21/2024. CLINICAL HISTORY: Neuro deficit, acute, stroke suspected. Crescentic left homonymous hemianopsia. FINDINGS: BRAIN AND VENTRICLES: Focal infarct along the inferior posterior right occipital lobe is again noted. Additional new punctate cortical infarcts are present slightly more laterally and posteriorly in the right occipital lobe. No acute Perihilar infarcts are present. Remote medial right occipital lobe infarct is again seen. T2  and FLAIR signal changes about the previously noted inferior right occipital infarct have increased slightly. No acute hemorrhage is present. Periventricular and other scattered subcortical T2 hyperintensities are mildly advanced for age. These are otherwise stable. No mass. No midline shift. No hydrocephalus. The sella is unremarkable. Normal flow voids. ORBITS: No acute abnormality. SINUSES AND MASTOIDS: No acute abnormality. BONES AND SOFT TISSUES: Normal marrow signal. No acute soft tissue abnormality. IMPRESSION: 1. Additional new punctate cortical infarcts in the right occipital lobe, slightly more lateral and posterior. 2. Focal infarct along the inferior posterior right occipital lobe, again noted, with slightly increased T2/FLAIR signal changes. 3. No acute hemorrhage. 4. Mildly advanced periventricular and other scattered subcortical T2 hyperintensities, stable. This most likely reflects the sequelae of chronic microvascular ischemia. Electronically signed by: Lonni Necessary MD 04/28/2024 04:22 AM EDT RP Workstation: [id number]   CT HEAD CODE STROKE WO CONTRAST Result Date: 04/27/2024 EXAM: CT HEAD WITHOUT 04/27/2024 12:23:30 PM TECHNIQUE: CT of the head was performed without the administration of intravenous contrast. Automated exposure control, iterative reconstruction, and/or weight based adjustment of the mA/kV was utilized to reduce the radiation dose to as low as reasonably achievable. COMPARISON: CT of the head dated 04/20/2024. CLINICAL HISTORY: Neuro deficit, acute, stroke suspected. LKW- approx 1 hr ago; Left arm/leg tingling, Brain feels weird, can't think straight; Stroke last week; Zackowski- [phone]. FINDINGS: BRAIN AND VENTRICLES: No acute intracranial hemorrhage. No mass effect or midline shift. No extra-axial fluid collection. Gray-white differentiation is maintained. No hydrocephalus. Chronic encephalomalacia changes again demonstrated medially within the right occipital  lobe. Aspect score is 10. ORBITS: No acute abnormality. SINUSES AND MASTOIDS: No acute abnormality. SOFT TISSUES AND SKULL: No acute skull fracture. No acute soft tissue abnormality. IMPRESSION: 1. No acute intracranial abnormality. 2. Chronic encephalomalacia changes medially within the right occipital lobe, stable compared to prior study dated 04/20/2024. 3. The above findings were discussed with Dr. Zackowski at 12:28 PM on 04/27/2024. Electronically signed by: evalene coho 04/27/2024 12:32 PM EDT RP Workstation: [id number]   MR BRAIN WO CONTRAST Result Date: 04/21/2024 EXAM: MRI BRAIN WITHOUT CONTRAST 04/21/2024 07:49:04 PM TECHNIQUE: Multiplanar multisequence MRI of the head/brain was performed without  the administration of intravenous contrast. COMPARISON: CTA head and neck dated 04/20/2024. CLINICAL HISTORY: Transient ischemic attack (TIA). Vision loss. FINDINGS: BRAIN AND VENTRICLES: There is an 8 mm focus of restricted diffusion within the inferior right occipital cortex compatible with acute infarct. Additional remote infarct in the medial right occipital lobe. Mild T2/FLAIR hyperintensity in the periventricular and subcortical white matter compatible with mild chronic microvascular ischemic changes. No intracranial hemorrhage. No mass. No midline shift. No hydrocephalus. The sella is unremarkable. Normal flow voids. ORBITS: No acute abnormality. SINUSES AND MASTOIDS: There is mild mucosal thickening in the inferior left maxillary sinus. No acute abnormality in the remaining sinuses and mastoids. BONES AND SOFT TISSUES: Normal marrow signal. No acute soft tissue abnormality. IMPRESSION: 1. Acute infarct in the inferior right occipital cortex. 2. Remote infarct in the medial right occipital lobe. 3. Mild chronic microvascular ischemic changes. Electronically signed by: Donnice Mania MD 04/21/2024 08:42 PM EDT RP Workstation: [id number]   ECHOCARDIOGRAM COMPLETE Result Date: 04/21/2024     ECHOCARDIOGRAM REPORT   Patient Name:   DAVIONNE DOWTY Date of Exam: 04/21/2024 Medical Rec #:  [id number]                 Height:       66.0 in Accession #:    [id number]                Weight:       192.0 lb Date of Birth:  [birth year]                 BSA:          1.965 m Patient Age:    68 years                  BP:           133/74 mmHg Patient Gender: M                         HR:           72 bpm. Exam Location:  Inpatient Procedure: 2D Echo, 3D Echo, Cardiac Doppler, Color Doppler and Strain Analysis            (Both Spectral and Color Flow Doppler were utilized during            procedure). Indications:    TIA  History:        Patient has prior history of Echocardiogram examinations, most                 recent 10/02/2023. CAD; Risk Factors:Hypertension and                 Dyslipidemia.  Sonographer:    Philomena Daring Referring Phys: [id number] VASUNDHRA RATHORE IMPRESSIONS  1. Left ventricular ejection fraction, by estimation, is 55 to 60%. The left ventricle has normal function. The left ventricle has no regional wall motion abnormalities. Left ventricular diastolic parameters are consistent with Grade I diastolic dysfunction (impaired relaxation).  2. Right ventricular systolic function is normal. The right ventricular size is normal.  3. The mitral valve is normal in structure. Trivial mitral valve regurgitation. No evidence of mitral stenosis.  4. The aortic valve is tricuspid. There is mild calcification of the aortic valve. Aortic valve regurgitation is not visualized. Aortic valve sclerosis/calcification is present, without any evidence of aortic stenosis.  5. Agitated saline contrast bubble study was negative, with no evidence of any interatrial  shunt. FINDINGS  Left Ventricle: Left ventricular ejection fraction, by estimation, is 55 to 60%. The left ventricle has normal function. The left ventricle has no regional wall motion abnormalities. Strain was performed and the global longitudinal strain  is indeterminate. The left ventricular internal cavity size was normal in size. There is no left ventricular hypertrophy. Left ventricular diastolic parameters are consistent with Grade I diastolic dysfunction (impaired relaxation). Right Ventricle: The right ventricular size is normal. No increase in right ventricular wall thickness. Right ventricular systolic function is normal. Left Atrium: Left atrial size was normal in size. Right Atrium: Right atrial size was normal in size. Pericardium: There is no evidence of pericardial effusion. Mitral Valve: The mitral valve is normal in structure. Trivial mitral valve regurgitation. No evidence of mitral valve stenosis. Tricuspid Valve: The tricuspid valve is normal in structure. Tricuspid valve regurgitation is trivial. No evidence of tricuspid stenosis. Aortic Valve: The aortic valve is tricuspid. There is mild calcification of the aortic valve. Aortic valve regurgitation is not visualized. Aortic valve sclerosis/calcification is present, without any evidence of aortic stenosis. Pulmonic Valve: The pulmonic valve was normal in structure. Pulmonic valve regurgitation is trivial. No evidence of pulmonic stenosis. Aorta: The aortic root is normal in size and structure. Venous: The inferior vena cava was not well visualized. IAS/Shunts: No atrial level shunt detected by color flow Doppler. Agitated saline contrast was given intravenously to evaluate for intracardiac shunting. Agitated saline contrast bubble study was negative, with no evidence of any interatrial shunt.  LEFT VENTRICLE PLAX 2D LVIDd:         4.75 cm      Diastology LVIDs:         3.98 cm      LV e' medial:    6.09 cm/s LV PW:         1.05 cm      LV E/e' medial:  10.1 LV IVS:        1.03 cm      LV e' lateral:   7.40 cm/s LVOT diam:     2.12 cm      LV E/e' lateral: 8.3 LV SV:         74 LV SV Index:   38 LVOT Area:     3.53 cm  LV Volumes (MOD) LV vol d, MOD A2C: 81.0 ml LV vol d, MOD A4C: 109.0 ml LV vol  s, MOD A2C: 39.8 ml LV vol s, MOD A4C: 56.0 ml LV SV MOD A2C:     41.2 ml LV SV MOD A4C:     109.0 ml LV SV MOD BP:      49.1 ml RIGHT VENTRICLE RV S prime:     10.70 cm/s TAPSE (M-mode): 1.8 cm LEFT ATRIUM             Index        RIGHT ATRIUM           Index LA diam:        3.04 cm 1.55 cm/m   RA Area:     12.30 cm LA Vol (A2C):   38.7 ml 19.69 ml/m  RA Volume:   26.00 ml  13.23 ml/m LA Vol (A4C):   36.3 ml 18.47 ml/m LA Biplane Vol: 38.7 ml 19.69 ml/m  AORTIC VALVE LVOT Vmax:   93.80 cm/s LVOT Vmean:  64.500 cm/s LVOT VTI:    0.210 m  AORTA Ao Root diam: 2.52 cm Ao Asc diam:  3.02 cm MITRAL  VALVE MV Area (PHT): 3.42 cm     SHUNTS MV Decel Time: 222 msec     Systemic VTI:  0.21 m MV E velocity: 61.30 cm/s   Systemic Diam: 2.12 cm MV A velocity: 115.00 cm/s MV E/A ratio:  0.53 Toribio Fuel MD Electronically signed by Toribio Fuel MD Signature Date/Time: 04/21/2024/10:13:45 AM    Final    CT Angio Head Neck W WO CM Result Date: 04/20/2024 CLINICAL DATA:  Vision loss, monocular EXAM: CT ANGIOGRAPHY HEAD AND NECK WITH AND WITHOUT CONTRAST TECHNIQUE: Multidetector CT imaging of the head and neck was performed using the standard protocol during bolus administration of intravenous contrast. Multiplanar CT image reconstructions and MIPs were obtained to evaluate the vascular anatomy. Carotid stenosis measurements (when applicable) are obtained utilizing NASCET criteria, using the distal internal carotid diameter as the denominator. RADIATION DOSE REDUCTION: This exam was performed according to the departmental dose-optimization program which includes automated exposure control, adjustment of the mA and/or kV according to patient size and/or use of iterative reconstruction technique. CONTRAST:  75mL OMNIPAQUE  IOHEXOL  350 MG/ML SOLN COMPARISON:  None Available. FINDINGS: CT HEAD FINDINGS Brain: Remote right occipital infarct. No evidence of acute large vascular territory infarct, acute hemorrhage, mass  lesion or midline shift. Vascular: See below. Skull: No acute fracture. Sinuses/Orbits: Clear sinuses.  No acute orbital findings. Other: No mastoid effusions. Review of the MIP images confirms the above findings CTA NECK FINDINGS Aortic arch: Great vessel origins are patent without proximal hemodynamically significant stenosis. Right carotid system: No evidence of dissection, stenosis (50% or greater), or occlusion. Left carotid system: No evidence of dissection, stenosis (50% or greater), or occlusion. Vertebral arteries: Codominant. No evidence of dissection, stenosis (50% or greater), or occlusion. Skeleton: No evidence of acute fracture. Other neck: No evidence of acute abnormality. Upper chest: Visualized lung apices are clear. Review of the MIP images confirms the above findings CTA HEAD FINDINGS Anterior circulation: Bilateral intracranial ICAs, MCAs, and ACAs are patent without proximal hemodynamically significant stenosis. No aneurysm identified. Posterior circulation: Bilateral intradural vertebral arteries, basilar artery and bilateral posterior cerebral arteries are patent without proximal hemodynamically significant stenosis. No aneurysm identified. Venous sinuses: As permitted by contrast timing, patent. Review of the MIP images confirms the above findings IMPRESSION: 1. No large vessel occlusion or proximal hemodynamically significant stenosis. 2. Remote right occipital infarct. Electronically Signed   By: Gilmore GORMAN Molt M.D.   On: 04/20/2024 19:23     PHYSICAL EXAM  Temp:  [97.4 F (36.3 C)-98.4 F (36.9 C)] 97.4 F (36.3 C) (08/08 1404) Pulse Rate:  [59-73] 73 (08/08 1404) Resp:  [14-20] 17 (08/08 1404) BP: (99-133)/(43-80) 118/73 (08/08 1404) SpO2:  [95 %-99 %] 99 % (08/08 1404)  General - Well nourished, well developed, in no apparent distress.  Ophthalmologic - fundi not visualized due to noncooperation.  Cardiovascular - Regular rhythm and rate.  Mental Status -  Level  of arousal and orientation to time, place, and person were intact. Language including expression, naming, repetition, comprehension was assessed and found intact. Attention span and concentration were normal. Fund of Knowledge was assessed and was intact.  Cranial Nerves II - XII - II - Visual field intact OD. At [facility], patchy anopia at left lower quadrant.  III, IV, VI - Extraocular movements intact. V - Facial sensation intact bilaterally. VII - Facial movement intact bilaterally. VIII - Hearing & vestibular intact bilaterally. X - Palate elevates symmetrically. XI - Chin turning & shoulder shrug intact bilaterally. XII -  Tongue protrusion intact.  Motor Strength - The patient's strength was normal in all extremities and pronator drift was absent.  Bulk was normal and fasciculations were absent.   Motor Tone - Muscle tone was assessed at the neck and appendages and was normal.  Reflexes - The patient's reflexes were symmetrical in all extremities and he had no pathological reflexes.  Sensory - Light touch, temperature/pinprick were assessed and were symmetrical.    Coordination - The patient had normal movements in the hands and feet with no ataxia or dysmetria.  Tremor was absent.  Gait and Station - deferred.   ASSESSMENT/PLAN Mr. Michael [name] is a 68 y.o. male with history of hypertension, hyperlipidemia, CAD status post stenting, recent stroke admitted for confusion, mental status change. No TNK given due to recent stroke.    Stroke:  new additional punctate infarct adjacent to previous R PCA small infarct, likely secondary to BP fluctuation and mild orthostatic hypotension CT no acute abnormality MRI showed bulging recent right PCA infarct with additional adjacent punctate infarct TEE unremarkable, no thrombus or PFO 30-day cardiac event monitoring ongoing LDL 42 Lovenox  for VTE prophylaxis aspirin  81 mg daily and Brilinta  (ticagrelor ) 90 mg bid prior to  admission, now on home aspirin  81 mg daily and Brilinta  (ticagrelor ) 90 mg bid.  Patient counseled to be compliant with his antithrombotic medications Ongoing aggressive stroke risk factor management Therapy recommendations: None Disposition: Pending  Recent stroke 04/21/2024 admitted for small right PCA infarct.  CT head and neck unremarkable.  EF 55 to 60%.  LDL 121, A1c 5.4.  Patient refused loop recorder and Librexia trial, discharged on aspirin  and Brilinta  and Lipitor  80 and Zetia  as well as 30-day CardioNet monitoring  Hypertension Slight orthostatic hypotension BP lying 120/67, sitting 115/73, standing at 0 minutes 110/64, standing 3 minutes 105/69 On gentle IV fluid BP stable now Long term BP goal normotensive  Hyperlipidemia Home meds: Lipitor  80 and Zetia  10 LDL 121->42, goal < 70 Now continue Lipitor  80 and Zetia  10 Continue statin at discharge  Other Stroke Risk Factors Advanced age CAD status post stenting, on DAPT and Imdur  Obesity, Body mass index is 31.51 kg/m.   Other Active Problems   Hospital day # 1  Neurology will sign off. Please call with questions. Pt will follow up with stroke clinic NP at [facility] in about 4 weeks. Thanks for the consult.   Ary Cummins, MD PhD Stroke Neurology 04/29/2024 5:15 PM    To contact Stroke Continuity provider, please refer to [email or web address]. After hours, contact General Neurology

## 2024-04-29 NOTE — Progress Notes (Signed)
 [facility] PROGRESS NOTE    Progress Note  Michael [name]  FMW:[id number] DOB: Dec 18, 1955 DOA: 04/27/2024 PCP: [name], MD     Brief Narrative:   Michael [name] is an 68 y.o. male past medical history of CAD with PCI in 2017 HFpEF, essential hypertension, BPH recently discharged on 04/22/2024 for an acute right occipital CVA returns with left visual field and speech deficits, along with confusion and left-sided numbness CTA of the head showed no acute findings MRI of the brain showing additional new punctuated cortical infarct on the right occipital lobe.  Neurology was consulted  Assessment/Plan:   Recurrent CVA right occipital lobe Recently discharged on the hospital on aspirin  Brilinta  and Lipitor . CT angio of the head and neck no LVO or hemodynamically significant stenosis. Neurology recommended a TEE. Cardiology was consulting TEE on 04/29/2024. Ongoing 30-day cardiac event monitor. LDL 42, Lovenox  for DVT prophylaxis. Continue aspirin , Brilinta  Lipitor  and Zetia . PT OT evaluated the patient.  History of CAD status post stent 2017: Continue aspirin , Lipitor , Brilinta , Setia, and/or and Ranexa .  Chronic systolic heart failure/ischemic cardiomyopathy: Stable.  Essential hypertension: Continue Imdur .  Hyperlipidemia: Continue statins.  History of BPH: Continue current home regimen.  DVT prophylaxis: lovenox  Family Communication:none Status is: Inpatient Remains inpatient appropriate because: Recurrent acute CVA    Code Status:     Code Status Orders  (From admission, onward)           Start     Ordered   04/27/24 2335  Full code  Continuous       Question:  By:  Answer:  Consent: discussion documented in EHR   04/27/24 2336           Code Status History     Date Active Date Inactive Code Status Order ID Comments User Context   04/21/2024 0212 04/22/2024 2053 Full Code [id number]  Alfornia Madison, MD Inpatient    10/01/2023 1555 10/03/2023 1632 Full Code [id number]  Maryelizabeth Sos, NP ED   12/16/2018 1725 12/17/2018 2022 Full Code [id number]  Marylu Leita SAUNDERS, NP ED   05/10/2016 1501 05/13/2016 2025 Full Code [id number]  Claudene Rocky BRAVO, NP ED   05/05/2016 1950 05/07/2016 1441 Full Code [id number]  Maxie Herlene POUR, PA-C ED   06/10/2015 2104 06/11/2015 1837 Full Code [id number]  Blanca Elsie RAMAN, MD ED         IV Access:   Peripheral IV   Procedures and diagnostic studies:   MR BRAIN WO CONTRAST Result Date: 04/28/2024 EXAM: MRI BRAIN WITHOUT CONTRAST 04/28/2024 12:37:00 AM TECHNIQUE: Multiplanar multisequence MRI of the head/brain was performed without the administration of intravenous contrast. COMPARISON: CT head without contrast 04/27/2024. MR head without contrast 04/21/2024. CLINICAL HISTORY: Neuro deficit, acute, stroke suspected. Crescentic left homonymous hemianopsia. FINDINGS: BRAIN AND VENTRICLES: Focal infarct along the inferior posterior right occipital lobe is again noted. Additional new punctate cortical infarcts are present slightly more laterally and posteriorly in the right occipital lobe. No acute Perihilar infarcts are present. Remote medial right occipital lobe infarct is again seen. T2 and FLAIR signal changes about the previously noted inferior right occipital infarct have increased slightly. No acute hemorrhage is present. Periventricular and other scattered subcortical T2 hyperintensities are mildly advanced for age. These are otherwise stable. No mass. No midline shift. No hydrocephalus. The sella is unremarkable. Normal flow voids. ORBITS: No acute abnormality. SINUSES AND MASTOIDS: No acute abnormality. BONES AND SOFT TISSUES: Normal marrow signal. No acute soft tissue abnormality. IMPRESSION: 1.  Additional new punctate cortical infarcts in the right occipital lobe, slightly more lateral and posterior. 2. Focal infarct along the inferior posterior right occipital lobe, again noted, with slightly  increased T2/FLAIR signal changes. 3. No acute hemorrhage. 4. Mildly advanced periventricular and other scattered subcortical T2 hyperintensities, stable. This most likely reflects the sequelae of chronic microvascular ischemia. Electronically signed by: Lonni Necessary MD 04/28/2024 04:22 AM EDT RP Workstation: [id number]   CT HEAD CODE STROKE WO CONTRAST Result Date: 04/27/2024 EXAM: CT HEAD WITHOUT 04/27/2024 12:23:30 PM TECHNIQUE: CT of the head was performed without the administration of intravenous contrast. Automated exposure control, iterative reconstruction, and/or weight based adjustment of the mA/kV was utilized to reduce the radiation dose to as low as reasonably achievable. COMPARISON: CT of the head dated 04/20/2024. CLINICAL HISTORY: Neuro deficit, acute, stroke suspected. LKW- approx 1 hr ago; Left arm/leg tingling, Brain feels weird, can't think straight; Stroke last week; Michael [name]- [phone]. FINDINGS: BRAIN AND VENTRICLES: No acute intracranial hemorrhage. No mass effect or midline shift. No extra-axial fluid collection. Gray-white differentiation is maintained. No hydrocephalus. Chronic encephalomalacia changes again demonstrated medially within the right occipital lobe. Aspect score is 10. ORBITS: No acute abnormality. SINUSES AND MASTOIDS: No acute abnormality. SOFT TISSUES AND SKULL: No acute skull fracture. No acute soft tissue abnormality. IMPRESSION: 1. No acute intracranial abnormality. 2. Chronic encephalomalacia changes medially within the right occipital lobe, stable compared to prior study dated 04/20/2024. 3. The above findings were discussed with Dr. Zackowski at 12:28 PM on 04/27/2024. Electronically signed by: evalene coho 04/27/2024 12:32 PM EDT RP Workstation: [id number]     Medical Consultants:   None.   Subjective:    Michael [name] no complaints  Objective:    Vitals:   04/28/24 1551 04/28/24 2041 04/28/24 2349 04/29/24 0350  BP:  123/73 99/68 114/68 (!) 103/43  Pulse: 71 65 63 60  Resp: 18 18    Temp: 98.3 F (36.8 C) 98.4 F (36.9 C) 97.7 F (36.5 C) 97.7 F (36.5 C)  TempSrc: Oral Oral Oral Oral  SpO2: 100% 98% 98% 97%  Weight:      Height:       SpO2: 97 %   Intake/Output Summary (Last 24 hours) at 04/29/2024 0748 Last data filed at 04/28/2024 1700 Gross per 24 hour  Intake 1234.36 ml  Output --  Net 1234.36 ml   Filed Weights   04/27/24 2326  Weight: 88.5 kg    Exam: General exam: In no acute distress. Respiratory system: Good air movement and clear to auscultation. Cardiovascular system: S1 & S2 heard, RRR. No JVD. Gastrointestinal system: Abdomen is nondistended, soft and nontender.  Extremities: No pedal edema. Skin: No rashes, lesions or ulcers Psychiatry: Judgement and insight appear normal. Mood & affect appropriate.    Data Reviewed:    Labs: Basic Metabolic Panel: Recent Labs  Lab 04/27/24 1214 04/28/24 0227  NA 140  --   K 3.5  --   CL 103  --   CO2 26  --   GLUCOSE 77  --   BUN 16  --   CREATININE 1.03 1.09  CALCIUM  9.1  --    GFR Estimated Creatinine Clearance: 66.7 mL/min (by C-G formula based on SCr of 1.09 mg/dL). Liver Function Tests: Recent Labs  Lab 04/27/24 1214  AST 29  ALT 29  ALKPHOS 84  BILITOT 1.0  PROT 6.4*  ALBUMIN 3.9   No results for input(s): LIPASE, AMYLASE in the last 168 hours. No results  for input(s): AMMONIA in the last 168 hours. Coagulation profile Recent Labs  Lab 04/27/24 1214  INR 1.0   COVID-19 Labs  No results for input(s): DDIMER, FERRITIN, LDH, CRP in the last 72 hours.  Lab Results  Component Value Date   SARSCOV2NAA Not Detected 04/01/2019    CBC: Recent Labs  Lab 04/27/24 1214 04/28/24 0227  WBC 9.2 7.4  NEUTROABS 6.6  --   HGB 13.9 13.1  HCT 39.8 37.4*  MCV 92.6 91.9  PLT 255 236   Cardiac Enzymes: No results for input(s): CKTOTAL, CKMB, CKMBINDEX, TROPONINI in the last 168  hours. BNP (last 3 results) No results for input(s): PROBNP in the last 8760 hours. CBG: Recent Labs  Lab 04/27/24 1212  GLUCAP 75   D-Dimer: No results for input(s): DDIMER in the last 72 hours. Hgb A1c: No results for input(s): HGBA1C in the last 72 hours. Lipid Profile: Recent Labs    04/28/24 0227  CHOL 94  HDL 41  LDLCALC 42  TRIG 54  CHOLHDL 2.3   Thyroid  function studies: No results for input(s): TSH, T4TOTAL, T3FREE, THYROIDAB in the last 72 hours.  Invalid input(s): FREET3 Anemia work up: No results for input(s): VITAMINB12, FOLATE, FERRITIN, TIBC, IRON, RETICCTPCT in the last 72 hours. Sepsis Labs: Recent Labs  Lab 04/27/24 1214 04/28/24 0227  WBC 9.2 7.4   Microbiology No results found for this or any previous visit (from the past 240 hours).   Medications:    aspirin  EC  81 mg Oral Daily   atorvastatin   80 mg Oral Daily   enoxaparin  (LOVENOX ) injection  40 mg Subcutaneous Q24H   ezetimibe   10 mg Oral Daily   isosorbide  mononitrate  60 mg Oral Daily   ranolazine   500 mg Oral BID   sodium chloride  flush  3 mL Intravenous Once   ticagrelor   60 mg Oral BID   vitamin B-12  500 mcg Oral QODAY   Continuous Infusions:    LOS: 1 day   Erle Odell Castor  [facility]  04/29/2024, 7:48 AM

## 2024-04-29 NOTE — CV Procedure (Signed)
 TEE Anesthesia: Propofol   EF 55-60%  No ASD/PFO Negative bubble study No LAA thrombus Normal RV Mild MR Mild TR Tri leaflet AV mild sclerosis Normal aortic root No effusion Grade one aortic atherosclerosis  See full report in [facility]  Maude Emmer MD [facility]

## 2024-04-29 NOTE — Anesthesia Preprocedure Evaluation (Addendum)
 Anesthesia Evaluation  Patient identified by MRN, date of birth, ID band Patient awake    Reviewed: Allergy & Precautions, NPO status , Patient's Chart, lab work & pertinent test results  Airway Mallampati: I  TM Distance: >3 FB Neck ROM: Full    Dental  (+) Dental Advisory Given, Teeth Intact   Pulmonary neg pulmonary ROS, neg shortness of breath   Pulmonary exam normal breath sounds clear to auscultation       Cardiovascular Exercise Tolerance: Good hypertension, Pt. on medications + angina  + CAD, + Past MI and + Cardiac Stents  (-) DOE Normal cardiovascular exam Rhythm:Regular Rate:Normal  Echo 03/2024  1. Left ventricular ejection fraction, by estimation, is 55 to 60%. The left ventricle has normal function. The left ventricle has no regional wall motion abnormalities. Left ventricular diastolic parameters are consistent with Grade I diastolic dysfunction (impaired relaxation).   2. Right ventricular systolic function is normal. The right ventricular size is normal.   3. The mitral valve is normal in structure. Trivial mitral valve regurgitation. No evidence of mitral stenosis.   4. The aortic valve is tricuspid. There is mild calcification of the aortic valve. Aortic valve regurgitation is not visualized. Aortic valve sclerosis/calcification is present, without any evidence of aortic stenosis.   5. Agitated saline contrast bubble study was negative, with no evidence of any interatrial shunt.    Cath 09/2023   Dist Cx lesion is 20% stenosed.   Previously placed Dist RCA stent of unknown type is  widely patent.   Previously placed Prox LAD to Mid LAD stent of unknown type is  widely patent.   Previously placed Mid Cx to Dist Cx stent of unknown type is  widely patent.   1.  Widely patent stents in proximal to mid LAD, mid left circumflex, and distal right coronary artery with mild nonobstructive disease elsewhere. 2.  LVEDP  of 15 mmHg. 3.  Evidence for coronary microvascular dysfunction with a CFR of 1.5 with normal being greater than 2-2.5. 4.  Occluded right radial artery necessitating a left radial approach.   Recommendation: Given evidence of coronary microvascular dysfunction suggest withdrawing nitrate therapy and up titrating doses of ARB's, calcium  channel blockers, and beta-blockers.    Neuro/Psych CVA    GI/Hepatic Neg liver ROS,GERD  Medicated and Controlled,,  Endo/Other  negative endocrine ROS    Renal/GU negative Renal ROS     Musculoskeletal  (+) Arthritis ,    Abdominal   Peds  Hematology negative hematology ROS (+)   Anesthesia Other Findings   Reproductive/Obstetrics                              Anesthesia Physical Anesthesia Plan  ASA: 3  Anesthesia Plan: MAC   Post-op Pain Management: Minimal or no pain anticipated   Induction:   PONV Risk Score and Plan: 1 and Propofol  infusion, TIVA and Treatment may vary due to age or medical condition  Airway Management Planned:   Additional Equipment:   Intra-op Plan:   Post-operative Plan:   Informed Consent: I have reviewed the patients History and Physical, chart, labs and discussed the procedure including the risks, benefits and alternatives for the proposed anesthesia with the patient or authorized representative who has indicated his/her understanding and acceptance.     Dental advisory given  Plan Discussed with: CRNA  Anesthesia Plan Comments: (Pt with mild epistaxis in left nare. Pt states he is  swallowing it, but denies any nausea believes it is a scant amount. Discussed with Dr. Delford and Dr. Leopoldo. Proceed with MAC/deep sedation.)         Anesthesia Quick Evaluation

## 2024-04-29 NOTE — Interval H&P Note (Signed)
 History and Physical Interval Note:  04/29/2024 11:43 AM  Michael [name]  has presented today for surgery, with the diagnosis of CVA.  The various methods of treatment have been discussed with the patient and family. After consideration of risks, benefits and other options for treatment, the patient has consented to  Procedure(s): TRANSESOPHAGEAL ECHOCARDIOGRAM (N/A) as a surgical intervention.  The patient's history has been reviewed, patient examined, no change in status, stable for surgery.  I have reviewed the patient's chart and labs.  Questions were answered to the patient's satisfaction.     Maude Emmer

## 2024-04-29 NOTE — Plan of Care (Signed)
  Problem: Education: Goal: Knowledge of General Education information will improve Description: Including pain rating scale, medication(s)/side effects and non-pharmacologic comfort measures Outcome: Progressing   Problem: Health Behavior/Discharge Planning: Goal: Ability to manage health-related needs will improve Outcome: Progressing   Problem: Clinical Measurements: Goal: Ability to maintain clinical measurements within normal limits will improve Outcome: Progressing Goal: Will remain free from infection Outcome: Progressing Goal: Diagnostic test results will improve Outcome: Progressing Goal: Respiratory complications will improve Outcome: Progressing Goal: Cardiovascular complication will be avoided Outcome: Progressing   Problem: Activity: Goal: Risk for activity intolerance will decrease Outcome: Progressing   Problem: Nutrition: Goal: Adequate nutrition will be maintained Outcome: Progressing   Problem: Coping: Goal: Level of anxiety will decrease Outcome: Progressing   Problem: Elimination: Goal: Will not experience complications related to bowel motility Outcome: Progressing Goal: Will not experience complications related to urinary retention Outcome: Progressing   Problem: Pain Managment: Goal: General experience of comfort will improve and/or be controlled Outcome: Progressing   Problem: Safety: Goal: Ability to remain free from injury will improve Outcome: Progressing   Problem: Skin Integrity: Goal: Risk for impaired skin integrity will decrease Outcome: Progressing   Problem: Education: Goal: Knowledge of disease or condition will improve Outcome: Progressing Goal: Knowledge of secondary prevention will improve (MUST DOCUMENT ALL) Outcome: Progressing Goal: Knowledge of patient specific risk factors will improve (DELETE if not current risk factor) Outcome: Progressing   Problem: Coping: Goal: Will verbalize positive feelings about  self Outcome: Progressing Goal: Will identify appropriate support needs Outcome: Progressing   Problem: Health Behavior/Discharge Planning: Goal: Ability to manage health-related needs will improve Outcome: Progressing Goal: Goals will be collaboratively established with patient/family Outcome: Progressing   Problem: Self-Care: Goal: Ability to participate in self-care as condition permits will improve Outcome: Progressing Goal: Verbalization of feelings and concerns over difficulty with self-care will improve Outcome: Progressing Goal: Ability to communicate needs accurately will improve Outcome: Progressing   Problem: Nutrition: Goal: Risk of aspiration will decrease Outcome: Progressing Goal: Dietary intake will improve Outcome: Progressing

## 2024-05-02 ENCOUNTER — Encounter: Payer: Self-pay | Admitting: Family Medicine

## 2024-05-02 ENCOUNTER — Telehealth: Payer: Self-pay | Admitting: *Deleted

## 2024-05-02 NOTE — Transitions of Care (Post Inpatient/ED Visit) (Signed)
 05/02/2024  Name: Michael [name] MRN: [id number] DOB: 1956/02/23  Today's TOC FU Call Status: Today's TOC FU Call Status:: Successful TOC FU Call Completed TOC FU Call Complete Date: 05/02/24 Patient's Name and Date of Birth confirmed.  Transition Care Management Follow-up Telephone Call Date of Discharge: 04/29/24 Discharge Facility: [facility]) Type of Discharge: Inpatient Admission Primary Inpatient Discharge Diagnosis:: stroke How have you been since you were released from the hospital?: Better Any questions or concerns?: No  Items Reviewed: Medications obtained,verified, and reconciled?: Yes (Medications Reviewed) (Per patient when he takes the ranplazine he feels a little diffierent. not chest pain or abnormal rhythmn. Patient is going to call cardiologist for these symptoms.) Any new allergies since your discharge?: No Dietary orders reviewed?: No Do you have support at home?: Yes People in Home [RPT]: spouse Name of Support/Comfort Primary Source: [name]  Medications Reviewed Today: Medications Reviewed Today     Reviewed by Kennieth Cathlean DEL, RN (Case Manager) on 05/02/24 at 1054  Med List Status: <None>   Medication Order Taking? Sig Documenting Provider Last Dose Status Informant  aspirin  81 MG tablet [id number] Yes Take 81 mg by mouth daily. [provider]  Active Self  atorvastatin  (LIPITOR ) 80 MG tablet [id number] Yes TAKE 1 TABLET DAILY Hochrein, James, MD  Active Self  Coenzyme Q10 (Q-SORB CO Q-10) 100 MG capsule [id number] Yes Take 1 capsule by mouth daily. [provider]  Active Self  Cyanocobalamin  (VITAMIN B-12 PO) [id number] Yes Take 1 tablet by mouth every other day. [provider]  Active Self  ezetimibe  (ZETIA ) 10 MG tablet [id number] Yes Take 1 tablet (10 mg total) by mouth daily. Samtani, Jai-Gurmukh, MD  Active Self  famotidine  (PEPCID ) 40 MG tablet [id number] Yes Take 1 tablet (40 mg total) by mouth daily.   Patient taking differently: Take 40 mg by mouth daily as needed for heartburn or indigestion.   [name], MD  Active Self           Med Note [name], [facility]   Wed Apr 27, 2024  1:09 PM) Only prn  isosorbide  mononitrate (IMDUR ) 60 MG 24 hr tablet [id number] Yes TAKE 1 TABLET DAILY Lavona Agent, MD  Active Self  ranolazine  (RANEXA ) 500 MG 12 hr tablet [id number] Yes Take 1 tablet (500 mg total) by mouth 2 (two) times daily. [name], PA-C  Active Self  ticagrelor  (BRILINTA ) 60 MG TABS tablet [id number] Yes Take 1 tablet (60 mg total) by mouth 2 (two) times daily. Lavona Agent, MD  Active Self  Med List Note [name] [name] Bishop 04/27/24 1248): [facility] patient            Home Care and Equipment/Supplies: Were Home Health Services Ordered?: NA Any new equipment or medical supplies ordered?: NA  Functional Questionnaire: Do you need assistance with bathing/showering or dressing?: No Do you need assistance with meal preparation?: No Do you need assistance with eating?: No Do you have difficulty maintaining continence: No Do you need assistance with getting out of bed/getting out of a chair/moving?: No Do you have difficulty managing or taking your medications?: No  Follow up appointments reviewed: PCP Follow-up appointment confirmed?: No MD Provider Line Number:[phone] Given: Yes (Per patient he will call Dr Katrinka for F/u appt) Specialist Hospital Follow-up appointment confirmed?: No Reason Specialist Follow-Up Not Confirmed: Patient has Specialist Provider Number and will Call for Appointment (Patient understand to schedule an appt with [facility]. A referral was sent) Do  you need transportation to your follow-up appointment?: No Do you understand care options if your condition(s) worsen?: Yes-patient verbalized understanding  SDOH Interventions Today    Flowsheet Row Most Recent Value  SDOH Interventions   Food Insecurity  Interventions Intervention Not Indicated  Housing Interventions Intervention Not Indicated  Transportation Interventions Intervention Not Indicated  Utilities Interventions Intervention Not Indicated   The patient has been provided with contact information for the care management team and has been advised to call with any health-related questions or concerns. The patient verbalized understanding with current POC. The patient is directed to their insurance card regarding availability of benefits coverage   The patient was informed of the importance of following up with his PCP within 7-14 days. Per patient he will call the PCP.  Cathlean Headland BSN RN [facility] Health Care Management Coordinator [email or web address] Direct Dial: [phone]  Fax: [phone] Website: [email or web address]

## 2024-05-03 ENCOUNTER — Other Ambulatory Visit: Payer: Self-pay | Admitting: Family Medicine

## 2024-05-03 ENCOUNTER — Telehealth: Payer: Self-pay

## 2024-05-03 DIAGNOSIS — I639 Cerebral infarction, unspecified: Secondary | ICD-10-CM

## 2024-05-03 DIAGNOSIS — I10 Essential (primary) hypertension: Secondary | ICD-10-CM

## 2024-05-03 NOTE — Transitions of Care (Post Inpatient/ED Visit) (Signed)
 Stroke Discharge Follow-up   05/03/2024 Name:  Michael [name] MRN:  [id number] DOB:  05/02/1956  Subjective: Michael [name] is a 68 y.o. year old male who is a primary care patient of [name], MD An Emmi alert was received indicating patient responded to questions: Been able to take every dose of meds?. I reached out by phone to follow up on the alert and spoke to Patient. He states previously he thought he had the side effect of itching from previous medication prior to hospitalization. He states he is taking all his medication now and is monitoring for any side effects.  He denies any problems presently and is doing good.    Care Management Interventions: Reviewed red alert.  Outreach to patient and addressed alert. Discussed follow up appointments with patient.    Advised patient that they would continue to get automated EMMI-Stroke post discharge calls to assess how they are doing following recent hospitalization and will receive a call from a nurse if any of their responses were abnormal. Patient voiced understanding and was appreciative of follow up call.  Follow up plan: No further intervention required.   Michael [name] J. Derik Fults RN, MSN [facility], [facility] RN Care Manager Direct Dial: [phone]  Fax: [phone] Website: [email or web address]

## 2024-05-04 ENCOUNTER — Telehealth: Payer: Self-pay

## 2024-05-04 NOTE — Anesthesia Postprocedure Evaluation (Signed)
 Anesthesia Post Note  Patient: Michael [name]  Procedure(s) Performed: TRANSESOPHAGEAL ECHOCARDIOGRAM     Patient location during evaluation: Cath Lab Anesthesia Type: MAC Level of consciousness: awake and alert Pain management: pain level controlled Vital Signs Assessment: post-procedure vital signs reviewed and stable Respiratory status: spontaneous breathing, nonlabored ventilation and respiratory function stable Cardiovascular status: stable and blood pressure returned to baseline Postop Assessment: no apparent nausea or vomiting Anesthetic complications: no   No notable events documented.               Johndaniel Catlin

## 2024-05-09 NOTE — Telephone Encounter (Signed)
 Michael [name]

## 2024-05-11 ENCOUNTER — Ambulatory Visit: Payer: Self-pay | Admitting: Family Medicine

## 2024-05-11 ENCOUNTER — Other Ambulatory Visit (INDEPENDENT_AMBULATORY_CARE_PROVIDER_SITE_OTHER)

## 2024-05-11 DIAGNOSIS — I10 Essential (primary) hypertension: Secondary | ICD-10-CM | POA: Diagnosis not present

## 2024-05-11 DIAGNOSIS — E785 Hyperlipidemia, unspecified: Secondary | ICD-10-CM | POA: Diagnosis not present

## 2024-05-11 LAB — COMPREHENSIVE METABOLIC PANEL WITH GFR
ALT: 19 U/L (ref 0–53)
AST: 19 U/L (ref 0–37)
Albumin: 4 g/dL (ref 3.5–5.2)
Alkaline Phosphatase: 63 U/L (ref 39–117)
BUN: 20 mg/dL (ref 6–23)
CO2: 27 meq/L (ref 19–32)
Calcium: 8.8 mg/dL (ref 8.4–10.5)
Chloride: 106 meq/L (ref 96–112)
Creatinine, Ser: 0.94 mg/dL (ref 0.40–1.50)
GFR: 82.91 mL/min (ref >60.00)
Glucose, Bld: 104 mg/dL — ABNORMAL HIGH (ref 70–99)
Potassium: 3.9 meq/L (ref 3.5–5.1)
Sodium: 141 meq/L (ref 135–145)
Total Bilirubin: 1.4 mg/dL — ABNORMAL HIGH (ref 0.2–1.2)
Total Protein: 6.1 g/dL (ref 6.0–8.3)

## 2024-05-11 LAB — BASIC METABOLIC PANEL WITH GFR
BUN: 20 mg/dL (ref 6–23)
CO2: 27 meq/L (ref 19–32)
Calcium: 8.8 mg/dL (ref 8.4–10.5)
Chloride: 106 meq/L (ref 96–112)
Creatinine, Ser: 0.94 mg/dL (ref 0.40–1.50)
GFR: 82.91 mL/min (ref >60.00)
Glucose, Bld: 104 mg/dL — ABNORMAL HIGH (ref 70–99)
Potassium: 3.9 meq/L (ref 3.5–5.1)
Sodium: 141 meq/L (ref 135–145)

## 2024-05-11 LAB — CBC WITH DIFFERENTIAL/PLATELET
Basophils Absolute: 0.1 K/uL (ref 0.0–0.1)
Basophils Relative: 1 % (ref 0.0–3.0)
Eosinophils Absolute: 0.2 K/uL (ref 0.0–0.7)
Eosinophils Relative: 3.2 % (ref 0.0–5.0)
HCT: 41.6 % (ref 39.0–52.0)
Hemoglobin: 14.3 g/dL (ref 13.0–17.0)
Lymphocytes Relative: 16.1 % (ref 12.0–46.0)
Lymphs Abs: 1.1 K/uL (ref 0.7–4.0)
MCHC: 34.5 g/dL (ref 30.0–36.0)
MCV: 92.9 fl (ref 78.0–100.0)
Monocytes Absolute: 0.5 K/uL (ref 0.1–1.0)
Monocytes Relative: 6.7 % (ref 3.0–12.0)
Neutro Abs: 5.1 K/uL (ref 1.4–7.7)
Neutrophils Relative %: 73 % (ref 43.0–77.0)
Platelets: 228 K/uL (ref 150.0–400.0)
RBC: 4.48 Mil/uL (ref 4.22–5.81)
RDW: 13.3 % (ref 11.5–15.5)
WBC: 7 K/uL (ref 4.0–10.5)

## 2024-05-11 LAB — LIPID PANEL
Cholesterol: 93 mg/dL (ref 0–200)
HDL: 47.2 mg/dL (ref >39.00)
LDL Cholesterol: 31 mg/dL (ref 0–99)
NonHDL: 45.31
Total CHOL/HDL Ratio: 2
Triglycerides: 70 mg/dL (ref 0.0–149.0)
VLDL: 14 mg/dL (ref 0.0–40.0)

## 2024-05-16 ENCOUNTER — Telehealth: Payer: Self-pay | Admitting: Cardiology

## 2024-05-16 NOTE — Telephone Encounter (Signed)
 Pt c/o medication issue:  1. Name of Medication: ticagrelor  (BRILINTA ) 60 MG TABS tablet   2. How are you currently taking this medication (dosage and times per day)?   3. Are you having a reaction (difficulty breathing--STAT)?   4. What is your medication issue? Patient was calling to make sure he should still be taking this medication.

## 2024-05-16 NOTE — Telephone Encounter (Signed)
 Left message for pt to call.

## 2024-05-17 ENCOUNTER — Emergency Department (HOSPITAL_COMMUNITY)

## 2024-05-17 ENCOUNTER — Emergency Department (HOSPITAL_COMMUNITY)
Admission: EM | Admit: 2024-05-17 | Discharge: 2024-05-17 | Disposition: A | Discharge status: Home / Self Care | Attending: Emergency Medicine | Admitting: Emergency Medicine

## 2024-05-17 ENCOUNTER — Other Ambulatory Visit: Payer: Self-pay

## 2024-05-17 ENCOUNTER — Encounter (HOSPITAL_COMMUNITY): Payer: Self-pay

## 2024-05-17 DIAGNOSIS — Z7982 Long term (current) use of aspirin: Secondary | ICD-10-CM | POA: Insufficient documentation

## 2024-05-17 DIAGNOSIS — Z8673 Personal history of transient ischemic attack (TIA), and cerebral infarction without residual deficits: Secondary | ICD-10-CM | POA: Diagnosis not present

## 2024-05-17 DIAGNOSIS — R2990 Unspecified symptoms and signs involving the nervous system: Secondary | ICD-10-CM

## 2024-05-17 DIAGNOSIS — R42 Dizziness and giddiness: Secondary | ICD-10-CM | POA: Insufficient documentation

## 2024-05-17 DIAGNOSIS — I10 Essential (primary) hypertension: Secondary | ICD-10-CM | POA: Insufficient documentation

## 2024-05-17 LAB — I-STAT CHEM 8, ED
BUN: 20 mg/dL (ref 8–23)
Calcium, Ion: 1.02 mmol/L — ABNORMAL LOW (ref 1.15–1.40)
Chloride: 102 mmol/L (ref 98–111)
Creatinine, Ser: 1 mg/dL (ref 0.61–1.24)
Glucose, Bld: 126 mg/dL — ABNORMAL HIGH (ref 70–99)
HCT: 43 % (ref 39.0–52.0)
Hemoglobin: 14.6 g/dL (ref 13.0–17.0)
Potassium: 4.1 mmol/L (ref 3.5–5.1)
Sodium: 139 mmol/L (ref 135–145)
TCO2: 23 mmol/L (ref 22–32)

## 2024-05-17 LAB — COMPREHENSIVE METABOLIC PANEL WITH GFR
ALT: 27 U/L (ref 0–44)
AST: 25 U/L (ref 15–41)
Albumin: 3.9 g/dL (ref 3.5–5.0)
Alkaline Phosphatase: 66 U/L (ref 38–126)
Anion gap: 10 (ref 5–15)
BUN: 19 mg/dL (ref 8–23)
CO2: 23 mmol/L (ref 22–32)
Calcium: 9.1 mg/dL (ref 8.9–10.3)
Chloride: 101 mmol/L (ref 98–111)
Creatinine, Ser: 1.06 mg/dL (ref 0.61–1.24)
GFR, Estimated: >60 mL/min (ref >60)
Glucose, Bld: 131 mg/dL — ABNORMAL HIGH (ref 70–99)
Potassium: 4.1 mmol/L (ref 3.5–5.1)
Sodium: 134 mmol/L — ABNORMAL LOW (ref 135–145)
Total Bilirubin: 1.8 mg/dL — ABNORMAL HIGH (ref 0.0–1.2)
Total Protein: 6.8 g/dL (ref 6.5–8.1)

## 2024-05-17 LAB — CBG MONITORING, ED: Glucose-Capillary: 131 mg/dL — ABNORMAL HIGH (ref 70–99)

## 2024-05-17 LAB — DIFFERENTIAL
Abs Immature Granulocytes: 0.03 K/uL (ref 0.00–0.07)
Basophils Absolute: 0 K/uL (ref 0.0–0.1)
Basophils Relative: 0 %
Eosinophils Absolute: 0.1 K/uL (ref 0.0–0.5)
Eosinophils Relative: 2 %
Immature Granulocytes: 0 %
Lymphocytes Relative: 15 %
Lymphs Abs: 1.1 K/uL (ref 0.7–4.0)
Monocytes Absolute: 0.4 K/uL (ref 0.1–1.0)
Monocytes Relative: 6 %
Neutro Abs: 5.7 K/uL (ref 1.7–7.7)
Neutrophils Relative %: 77 %

## 2024-05-17 LAB — CBC
HCT: 43.1 % (ref 39.0–52.0)
Hemoglobin: 14.9 g/dL (ref 13.0–17.0)
MCH: 32.3 pg (ref 26.0–34.0)
MCHC: 34.6 g/dL (ref 30.0–36.0)
MCV: 93.3 fL (ref 80.0–100.0)
Platelets: 230 K/uL (ref 150–400)
RBC: 4.62 MIL/uL (ref 4.22–5.81)
RDW: 12.8 % (ref 11.5–15.5)
WBC: 7.4 K/uL (ref 4.0–10.5)
nRBC: 0 % (ref 0.0–0.2)

## 2024-05-17 LAB — ETHANOL: Alcohol, Ethyl (B): <15 mg/dL (ref <15)

## 2024-05-17 NOTE — ED Notes (Signed)
 PT. Given discharge instructions, wife and pt verbalized discharge instructions. Opportunity was given to ask questions.

## 2024-05-17 NOTE — Discharge Instructions (Addendum)
 You were seen for your stroke like symptoms in the emergency department.   At home, please continue your medications.    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    No acute abnormality.    Check your MyChart online for the results of any tests that had not resulted by the time you left the emergency department.   Follow-up with your primary doctor in 2-3 days regarding your visit.  Follow-up with neurology.   Return immediately to the emergency department if you experience any of the following: difficulty speaking, worsening dizziness, arm or leg weakness or numbness, or any other concerning symptoms.    Thank you for visiting our Emergency Department. It was a pleasure taking care of you today.

## 2024-05-17 NOTE — Consult Note (Incomplete)
 NEUROLOGY CONSULT NOTE   Date of service: May 17, 2024 Patient Name: Michael [name] MRN:  [id number] DOB:  [birth year] Chief Complaint: dizziness Requesting Provider: Yolande Lamar BROCKS, MD  History of Present Illness  Vitaly Wanat is a 68 y.o. male with hx of recent right occipital infarcts, HTN, HLD, CAD s/p stenting who presented to ED d/t dizziness and left-sided numbness. CODE STROKE was activated by Triage RN.  On neurology exam, patient denies dizziness and only endorses numbness to the left side of his face, which he says is somewhat improved from before. He has no focal deficits or confusion present.   LKW: 1130 Modified rankin score: 0-Completely asymptomatic and back to baseline post- stroke IV Thrombolysis: No, too mild to treat EVT: No, no LVO suspected  NIHSS components Score: Comment  1a Level of Conscious 0[]  1[]  2[]  3[]      1b LOC Questions 0[]  1[]  2[]       1c LOC Commands 0[]  1[]  2[]       2 Best Gaze 0[]  1[]  2[]       3 Visual 0[]  1[]  2[]  3[]      4 Facial Palsy 0[]  1[]  2[]  3[]      5a Motor Arm - left 0[]  1[]  2[]  3[]  4[]  UN[]    5b Motor Arm - Right 0[]  1[]  2[]  3[]  4[]  UN[]    6a Motor Leg - Left 0[]  1[]  2[]  3[]  4[]  UN[]    6b Motor Leg - Right 0[]  1[]  2[]  3[]  4[]  UN[]    7 Limb Ataxia 0[]  1[]  2[]  UN[]      8 Sensory 0[]  1[x]  2[]  UN[]     Left face with decreased sensation  9 Best Language 0[]  1[]  2[]  3[]      10 Dysarthria 0[]  1[]  2[]  UN[]      11 Extinct. and Inattention 0[]  1[]  2[]       TOTAL:   1      ROS  Comprehensive ROS performed and pertinent positives documented in HPI   Past History   Past Medical History:  Diagnosis Date   Acute lateral meniscal tear, right, initial encounter 09/26/2021   Injection given September 26, 2018.   CAD S/P PCI to Cx & RCA    a. s/p PCI of LCx (2006) and ostium of RCA (2007) as well as DES to RCA in [place] (05/2014)   HTN (hypertension)    Hyperlipidemia    Myocardial infarction (HCC) 05/29/2011    Plantar fasciitis 10/12/2017    Past Surgical History:  Procedure Laterality Date   CARDIAC CATHETERIZATION N/A 05/06/2016   Procedure: Left Heart Cath and Coronary Angiography;  Surgeon: Lonni Hanson, MD;  Location: [facility] INVASIVE CV LAB;  Service: Cardiovascular;  Laterality: N/A;   CARDIAC CATHETERIZATION N/A 05/06/2016   Procedure: Coronary Stent Intervention;  Surgeon: Lonni Hanson, MD;  Location: [facility] INVASIVE CV LAB;  Service: Cardiovascular;  Laterality: N/A;   CARDIAC CATHETERIZATION N/A 05/13/2016   Procedure: Coronary/Graft Angiography;  Surgeon: Peter M Swaziland, MD;  Location: [facility] INVASIVE CV LAB;  Service: Cardiovascular;  Laterality: N/A;   CORONARY ANGIOPLASTY WITH STENT PLACEMENT  09/22/2004   in [place] , occluded CFX, s/p Taxus stent   CORONARY ANGIOPLASTY WITH STENT PLACEMENT  09/22/2005   in [place] , ostial RCA stent   CORONARY ANGIOPLASTY WITH STENT PLACEMENT  09/22/2013   in [place] , RCA stent   CORONARY PRESSURE/FFR STUDY N/A 10/02/2023   Procedure: CORONARY PRESSURE/FFR STUDY;  Surgeon: Wendel Lurena POUR, MD;  Location: [facility] INVASIVE CV LAB;  Service: Cardiovascular;  Laterality: N/A;  LEFT HEART CATH AND CORONARY ANGIOGRAPHY N/A 10/02/2023   Procedure: LEFT HEART CATH AND CORONARY ANGIOGRAPHY;  Surgeon: Wendel Lurena POUR, MD;  Location: [facility] INVASIVE CV LAB;  Service: Cardiovascular;  Laterality: N/A;   TONSILLECTOMY AND ADENOIDECTOMY      TRANSESOPHAGEAL ECHOCARDIOGRAM (CATH LAB) N/A 04/29/2024   Procedure: TRANSESOPHAGEAL ECHOCARDIOGRAM;  Surgeon: Delford Maude BROCKS, MD;  Location: [facility] INVASIVE CV LAB;  Service: Cardiovascular;  Laterality: N/A;    Family History: Family History  Problem Relation Age of Onset   Hypertension Father    Dementia Father        Vascular. patient states dementia/possible alzheimers as well   Hyperlipidemia Sister    Cancer Brother        type unknown   Cancer Brother        type unknown   Heart disease Maternal  Grandfather 18       Died probably of heart diseasse   Heart disease Maternal Uncle 66       Died of heart exploding   Cerebral palsy Daughter    Stroke Son        severe problems with blood clotting   Colon cancer Neg Hx    Esophageal cancer Neg Hx    Pancreatic cancer Neg Hx    Stomach cancer Neg Hx    Liver disease Neg Hx     Social History  reports that he has never smoked. He has never used smokeless tobacco. He reports that he does not drink alcohol and does not use drugs.  No Known Allergies  Medications  No current facility-administered medications for this encounter.  Current Outpatient Medications:    aspirin  81 MG tablet, Take 81 mg by mouth daily., Disp: , Rfl:    atorvastatin  (LIPITOR ) 80 MG tablet, TAKE 1 TABLET DAILY, Disp: 90 tablet, Rfl: 3   Coenzyme Q10 (Q-SORB CO Q-10) 100 MG capsule, Take 1 capsule by mouth daily., Disp: , Rfl:    Cyanocobalamin  (VITAMIN B-12 PO), Take 1 tablet by mouth every other day., Disp: , Rfl:    ezetimibe  (ZETIA ) 10 MG tablet, Take 1 tablet (10 mg total) by mouth daily., Disp: 30 tablet, Rfl: 11   famotidine  (PEPCID ) 40 MG tablet, Take 1 tablet (40 mg total) by mouth daily. (Patient taking differently: Take 40 mg by mouth daily as needed for heartburn or indigestion.), Disp: 90 tablet, Rfl: 3   isosorbide  mononitrate (IMDUR ) 60 MG 24 hr tablet, TAKE 1 TABLET DAILY, Disp: 90 tablet, Rfl: 3   ranolazine  (RANEXA ) 500 MG 12 hr tablet, Take 1 tablet (500 mg total) by mouth 2 (two) times daily., Disp: 60 tablet, Rfl: 5   ticagrelor  (BRILINTA ) 60 MG TABS tablet, Take 1 tablet (60 mg total) by mouth 2 (two) times daily., Disp: 180 tablet, Rfl: 3  Vitals   Vitals:   05/17/24 1320  BP: 126/80  Pulse: 95  Resp: 17  Temp: 97.6 F (36.4 C)  SpO2: 98%    There is no height or weight on file to calculate BMI.   Physical Exam   Constitutional: Appears well-developed and well-nourished.  Cardiovascular: Normal rate and regular rhythm.   Respiratory: Effort normal, non-labored breathing.   Neurologic Examination   Neuro: Mental Status/Language: Patient is awake, alert, oriented to person, place, month, year, and situation. Patient is able to give a clear and coherent history. No signs of dysarthria, aphasia or neglect Cranial Nerves: II: Visual Fields are full. Pupils are equal, round, and reactive to light.  III,IV, VI: EOMI without ptosis or diploplia.  V: Facial sensation is reduced on the left VII: Facial movement is symmetric.  VIII: hearing is intact to voice X: Uvula elevates symmetrically XI: Shoulder shrug is symmetric. XII: tongue is midline without atrophy or fasciculations.  Motor: Tone is normal. Bulk is normal. 5/5 strength was present in all four extremities.  Sensory: Sensation is symmetric to light touch and temperature in the arms and legs. Cerebellar: FNF and HKS are intact bilaterally   Labs/Imaging/Neurodiagnostic studies   CBC:  Recent Labs  Lab 05-30-24 0907 05/17/24 1326 05/17/24 1341  WBC 7.0 7.4  --   NEUTROABS 5.1 5.7  --   HGB 14.3 14.9 14.6  HCT 41.6 43.1 43.0  MCV 92.9 93.3  --   PLT 228.0 230  --    Basic Metabolic Panel:  Lab Results  Component Value Date   NA 139 05/17/2024   K 4.1 05/17/2024   CO2 27 05/30/2024   CO2 27 May 30, 2024   GLUCOSE 126 (H) 05/17/2024   BUN 20 05/17/2024   CREATININE 1.00 05/17/2024   CALCIUM  8.8 05/30/24   CALCIUM  8.8 05-30-2024   GFRNONAA >60 04/28/2024   GFRAA 96 08/03/2020   Lipid Panel:  Lab Results  Component Value Date   LDLCALC 31 05/30/2024   HgbA1c:  Lab Results  Component Value Date   HGBA1C 5.4 04/21/2024   Urine Drug Screen: No results found for: LABOPIA, COCAINSCRNUR, LABBENZ, AMPHETMU, THCU, LABBARB  Alcohol Level     Component Value Date/Time   ETH <15 04/27/2024 1214   INR  Lab Results  Component Value Date   INR 1.0 04/27/2024   APTT  Lab Results  Component Value Date   APTT 25  04/27/2024   AED levels: No results found for: PHENYTOIN, ZONISAMIDE, LAMOTRIGINE, LEVETIRACETA  CT Head without contrast(Personally reviewed): No acute intracranial abnormality. ASPECTS of 10. Small chronic right occipital infarct.   04/20/2024 CT angio Head and Neck with contrast(Personally reviewed): No large vessel occlusion or proximal hemodynamically significant stenosis. Remote right occipital infarct.  04/28/2024 MRI Brain(Personally reviewed): Additional new punctate cortical infarcts in the right occipital lobe, slightly more lateral and posterior. Focal infarct along the inferior posterior right occipital lobe, again noted, with slightly increased T2/FLAIR signal changes. No acute hemorrhage. Mildly advanced periventricular and other scattered subcortical T2 hyperintensities, stable. This most likely reflects the sequelae of chronic microvascular ischemia.  04/21/2024 MRI Brain (Personally reviewed): Acute infarct in the inferior right occipital cortex. Remote infarct in the medial right occipital lobe. Mild chronic microvascular ischemic changes.  ASSESSMENT   Amauris Debois is a 68 y.o. male with hx of recent right occipital infarcts, HTN, HLD, CAD s/p stenting who presented to ED d/t dizziness and left-sided numbness.  On neurology exam, patient denies dizziness and only endorses numbness to the left side of his face, which he says is somewhat improved from before. He has no focal deficits or confusion present.   Patient has had extensive stroke workup completed in the past month. He is on aspirin  and brilinta , zetia  and lipitor  and is currently wearing a zio patch for heart monitoring. No further inpatient imaging/testing is indicated, even if MRI shows a new small stroke, as patient is already on multiple medications for secondary stroke risk factor modification.   RECOMMENDATIONS   - MRI Brain   - follow-up with PCP, Neurology for further secondary  stroke risk factor modification ______________________________________________________________________    Bonney Rocky JAYSON Judithe, NP [facility]

## 2024-05-17 NOTE — ED Provider Notes (Signed)
 [place] EMERGENCY DEPARTMENT AT [facility] Provider Note   CSN: [id number] Arrival date & time: 05/17/24  1316  An emergency department physician performed an initial assessment on this suspected stroke patient at 1330.  Patient presents with: Dizziness and Numbness   Michael [name] is a 68 y.o. male.   68 year old male with a history of MI status post PCI, hypertension, hyperlipidemia, and stroke who presents to the emergency department with dizziness.  Patient reports that at 11:30 AM he started feeling dizzy.  Has difficulty characterizing his room spinning or lightheaded.  Says he is also having a strange sensation on the left side of his face.  Family reported he was having some word finding difficulties.  No significant headache.  Not on AC.  Admitted to the hospital earlier in the month with stroke       Prior to Admission medications   Medication Sig Start Date End Date Taking? Authorizing Provider  aspirin  81 MG tablet Take 81 mg by mouth daily.    [provider]  atorvastatin  (LIPITOR ) 80 MG tablet TAKE 1 TABLET DAILY 10/21/23   Lavona Agent, MD  Coenzyme Q10 (Q-SORB CO Q-10) 100 MG capsule Take 1 capsule by mouth daily.    [provider]  Cyanocobalamin  (VITAMIN B-12 PO) Take 1 tablet by mouth every other day.    [provider]  ezetimibe  (ZETIA ) 10 MG tablet Take 1 tablet (10 mg total) by mouth daily. 04/22/24 04/22/25  Samtani, Jai-Gurmukh, MD  famotidine  (PEPCID ) 40 MG tablet Take 1 tablet (40 mg total) by mouth daily. Patient taking differently: Take 40 mg by mouth daily as needed for heartburn or indigestion. 07/15/22   [name], MD  isosorbide  mononitrate (IMDUR ) 60 MG 24 hr tablet TAKE 1 TABLET DAILY 10/20/23   Lavona Agent, MD  ranolazine  (RANEXA ) 500 MG 12 hr tablet Take 1 tablet (500 mg total) by mouth 2 (two) times daily. 10/03/23   [name], PA-C  ticagrelor  (BRILINTA ) 60 MG TABS tablet Take 1  tablet (60 mg total) by mouth 2 (two) times daily. 06/22/23   Lavona Agent, MD    Allergies: Patient has no known allergies.    Review of Systems  Updated Vital Signs BP 107/79   Pulse 85   Temp 97.6 F (36.4 C)   Resp 12   Ht 5' 6 (1.676 m)   Wt 88.5 kg   SpO2 100%   BMI 31.47 kg/m   Physical Exam Vitals and nursing note reviewed.  Constitutional:      General: He is not in acute distress.    Appearance: He is well-developed.  HENT:     Head: Normocephalic and atraumatic.     Right Ear: External ear normal.     Left Ear: External ear normal.     Nose: Nose normal.  Eyes:     Extraocular Movements: Extraocular movements intact.     Conjunctiva/sclera: Conjunctivae normal.     Pupils: Pupils are equal, round, and reactive to light.  Musculoskeletal:     Cervical back: Normal range of motion and neck supple.     Right lower leg: No edema.     Left lower leg: No edema.  Skin:    General: Skin is warm and dry.  Neurological:     Mental Status: He is alert.     Comments: NIHSS Exam  Level of Consciousness: Alert  LOC Questions: Answers Month and Age Correctly  LOC Commands: Opens and  Closes Eyes and Hands on command  Best Gaze: Horizontal ocular movements intact  Visual Fields: Left inferior quadrantanopia Facial Palsy: None  L Upper Extremity Motor: No drift after 10 seconds  R Upper Extremity Motor: No drift after 10 seconds  L Lower extremity Motor: No drift after 5 seconds  R Lower extremity Motor: No drift after 5 seconds  Ataxia: Absent  Sensory: Intact sensation to light touch on face, arms, trunk, and legs bilaterally  Best Language: No aphasia  Dysarthria: No dysarthria  Neglect: No visual or sensory neglect    Psychiatric:        Mood and Affect: Mood normal.        Behavior: Behavior normal.     (all labs ordered are listed, but only abnormal results are displayed) Labs Reviewed  COMPREHENSIVE METABOLIC PANEL WITH GFR - Abnormal; Notable for  the following components:      Result Value   Sodium 134 (*)    Glucose, Bld 131 (*)    Total Bilirubin 1.8 (*)    All other components within normal limits  CBG MONITORING, ED - Abnormal; Notable for the following components:   Glucose-Capillary 131 (*)    All other components within normal limits  I-STAT CHEM 8, ED - Abnormal; Notable for the following components:   Glucose, Bld 126 (*)    Calcium , Ion 1.02 (*)    All other components within normal limits  ETHANOL  CBC  DIFFERENTIAL  RAPID URINE DRUG SCREEN, HOSP PERFORMED    EKG: None  Radiology: MR BRAIN WO CONTRAST Result Date: 05/17/2024 CLINICAL DATA:  Dizziness EXAM: MRI HEAD WITHOUT CONTRAST TECHNIQUE: Multiplanar, multiecho pulse sequences of the brain and surrounding structures were obtained without intravenous contrast. COMPARISON:  April 28, 2024 FINDINGS: MRI brain: There is a small old infarct in the medial right occipital lobe with encephalomalacia. No acute infarct. No other significant signal abnormality in the brain parenchyma. The ventricles are normal. No mass lesion. There are normal flow signals in the carotid arteries and basilar artery. No significant bone marrow signal abnormality. No significant abnormality in the paranasal sinuses or soft tissues. 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 No acute abnormality. Electronically Signed   By: Nancyann Burns M.D.   On: 05/17/2024 16:31     Procedures   Medications Ordered in the ED - No data to display  Clinical Course as of 05/17/24 1644  Tue May 17, 2024  1354 Discussed with neurology Rutha NP).  Says that with his recent stroke evaluation does not necessarily need repeat admission.  They would like an MRI at this point in time to see if anything acute has occurred.  Can potentially be discharged home if he is able to walk without any assistance [RP]  1535 Signed out to Dr Jerrol [RP]    Clinical Course User  Index [RP] Yolande Lamar BROCKS, MD                                 Medical Decision Making Amount and/or Complexity of Data Reviewed Labs: ordered. Radiology: ordered.   68 year old male with a history of MI status post PCI, hypertension, hyperlipidemia, and stroke who presents to the emergency department with dizziness.    Initial Ddx:  Stroke, ICH, recrudescence, seizure, hypoglycemia  MDM/Course:  Patient presents to the emergency department with dizziness.  Also has a changes sensation on the left  side of his face and reported word finding difficulties.  Did activate a code stroke since the symptoms started 20 minutes ago and he is subjectively having dizziness.  However, on exam his only deficit for me is his inferior quadrants visual field deficit which is from prior stroke.  He was evaluated by neurology who requested that he have an MRI.  This was ordered and the patient was signed out to the oncoming physician awaiting the results of MRI.  Neurology felt that he likely could be discharged if negative and even if a small stroke was found since he recently was admitted likely can go home with neurology follow-up as an outpatient.  This patient presents to the ED for concern of complaints listed in HPI, this involves an extensive number of treatment options, and is a complaint that carries with it a high risk of complications and morbidity. Disposition including potential need for admission considered.   Dispo: Pending remainder of workup  Records reviewed Outpatient Clinic Notes The following labs were independently interpreted: Chemistry and show no acute abnormality I independently reviewed the following imaging with scope of interpretation limited to determining acute life threatening conditions related to emergency care: CT Head and agree with the radiologist interpretation with the following exceptions: none I personally reviewed and interpreted cardiac monitoring: normal sinus  rhythm  I personally reviewed and interpreted the pt's EKG: see above for interpretation  I have reviewed the patients home medications and made adjustments as needed Consults: Neurology Social Determinants of health:  Geriatric  Portions of this note were generated with Scientist, clinical (histocompatibility and immunogenetics). Dictation errors may occur despite best attempts at proofreading.     Final diagnoses:  Stroke-like symptoms    ED Discharge Orders     None          Yolande Lamar BROCKS, MD 05/17/24 [id number]

## 2024-05-17 NOTE — ED Notes (Signed)
 Patient transported to MRI

## 2024-05-17 NOTE — ED Notes (Signed)
 CCMD called.

## 2024-05-17 NOTE — ED Provider Notes (Signed)
  Physical Exam  BP 106/65   Pulse 79   Temp 97.6 F (36.4 C)   Resp 13   SpO2 100%   Physical Exam  Procedures  Procedures  ED Course / MDM   Clinical Course as of 05/17/24 1537  Tue May 17, 2024  1354 Discussed with neurology Rutha NP).  Says that with his recent stroke evaluation does not necessarily need repeat admission.  They would like an MRI at this point in time to see if anything acute has occurred.  Can potentially be discharged home if he is able to walk without any assistance [RP]  1535 Signed out to Dr Jerrol [RP]    Clinical Course User Index [RP] Yolande Lamar BROCKS, MD   Medical Decision Making Amount and/or Complexity of Data Reviewed Labs: ordered. Radiology: ordered.   [phone] presenting with dizziness. Previous inpatient neuro workup, needs MRI and if normal can be discharged.     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    No acute abnormality.   Patient's symptoms have resolved on repeat assessment.  Discussed the workup with the patient of old stroke on MRI, no acute stroke.  He has already had an inpatient stroke workup and has outpatient follow-up with a neurologist in 4 weeks.  Return precautions provided, patient stable for discharge and outpatient follow-up.   Jerrol Agent, MD 05/17/24 [id number]

## 2024-05-17 NOTE — ED Triage Notes (Signed)
 Patient reports dizziness starting at 1130, progressing to left arm numbness now. Family reports he's having difficulty finding words, recently has had similar symptoms and treated for stroke. A&Ox4, moving to bridge for assessment at this time.

## 2024-05-17 NOTE — Code Documentation (Signed)
 Stroke Response Nurse Documentation Code Documentation  Michael [name] is a 68 y.o. male arriving to [facility]  via Private Vehicle on 05/17/2024 with past medical hx of recent CVA. On aspirin  81 mg daily and Brilinta  (ticagrelor ) 90 mg bid. Code stroke was activated by ED.   Patient from home where he was LKW at 1130 and now complaining of dizziness and lt sided numbness.  Stroke team at the bedside on patient arrival. Labs drawn and patient cleared for CT by Dr. Yolande. Patient to CT with team. NIHSS 1, see documentation for details and code stroke times. Patient with left decreased sensation on exam. The following imaging was completed:  CT Head. Patient is not a candidate for IV Thrombolytic due to recent CVA. Patient is not a candidate for IR due to VAN negative on exam..   Care Plan:   NIHSS and VS q 2 x12, then q 4  NPO until Stroke Swallow complete.    Bedside handoff with ED RN L[place].    Michael [name]  Stroke Response RN

## 2024-05-20 ENCOUNTER — Encounter: Payer: Self-pay | Admitting: Internal Medicine

## 2024-05-20 ENCOUNTER — Ambulatory Visit (INDEPENDENT_AMBULATORY_CARE_PROVIDER_SITE_OTHER): Admitting: Internal Medicine

## 2024-05-20 VITALS — BP 120/66 | HR 86 | Temp 98.0°F | Ht 66.0 in | Wt 193.8 lb

## 2024-05-20 DIAGNOSIS — G459 Transient cerebral ischemic attack, unspecified: Secondary | ICD-10-CM

## 2024-05-20 DIAGNOSIS — I6930 Unspecified sequelae of cerebral infarction: Secondary | ICD-10-CM | POA: Diagnosis not present

## 2024-05-20 DIAGNOSIS — N644 Mastodynia: Secondary | ICD-10-CM

## 2024-05-20 MED ORDER — OZEMPIC (0.25 OR 0.5 MG/DOSE) 2 MG/3ML ~~LOC~~ SOPN: 0.2500 mg | PEN_INJECTOR | SUBCUTANEOUS | 2 refills | Status: DC

## 2024-05-20 NOTE — Progress Notes (Signed)
 ==============================      [facility]: [phone]   -- Medical Office Visit --  Patient: Michael [name]      Age: 68 y.o.       Sex:  male  Date:   05/20/2024 Today's Healthcare Provider: Bernardino KANDICE Cone, MD  ==============================   Chief Complaint: Hospitalization Follow-up  Discussed the use of AI scribe software for clinical note transcription with the patient, who gave verbal consent to proceed.  History of Present Illness 68 year old male with a history of stroke who presents for follow-up regarding his symptoms and medication management.  He has experienced brain fog for over a year, which predates his stroke. The brain fog worsens if he does not take turmeric, which he consumes about three times a week in a powder form mixed with ginger and honey.  He describes soreness in the left upper chest, associated with taking his morning medications. He has a history of cardiac issues. He has discontinued over-the-counter supplements to see if symptoms persist.  No new dizziness, weakness, numbness, vision changes, speech difficulties, confusion, balance issues, walking or coordination problems, headaches, memory loss, or brain fog since his ER visit on July 26, where he was diagnosed with TIA. He also denies chest pain, shortness of breath, heart racing, swelling in the legs, fainting, or difficulty with daily activities.  His current medications include aspirin , Lipitor , coenzyme Q10, Zetia , Imdur , Brilinta , and Pepcid  as needed. He has stopped taking B12 and CoQ10 temporarily to assess their impact on his symptoms. He resumed taking atorvastatin  in the evening after noticing symptoms when taking all medications together in the morning.  He has a family history of vascular issues, as his father had arteriosclerosis of the brain. He has a history of prediabetes. He is cautious about his diet,  And was encouraged to  consume avocado and extra virgin olive oil regularly, and avoid trans fats.  He experienced a stroke in July, with no known atrial fibrillation detected. He has been using a heart monitor and recently sent in the second monitor for evaluation.   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 He also has a film growing on his right eye, which is being monitored by an ophthalmologist. He experiences some word-finding difficulties, which he attributes to the stroke.  He has a history of elevated bilirubin levels, which he associates with statin use. He has been on statins since 2006 and has not experienced significant muscle soreness, possibly due to concurrent CoQ10 use.  Lab Results  Component Value Date   CHOL 93 05/11/2024   CHOL 94 04/28/2024   HDL 47.20 05/11/2024   HDL 41 04/28/2024   CHOLHDL 2 05/11/2024   CHOLHDL 2.3 04/28/2024   LDLCALC 31 05/11/2024   LDLCALC 42 04/28/2024   LDLDIRECT 62.0 09/14/2019   TRIG 70.0 05/11/2024   TRIG 54 04/28/2024   VLDL 14.0 05/11/2024   VLDL 11 04/28/2024   Lab Results  Component Value Date   HGBA1C 5.4 04/21/2024   HGBA1C 6.2 (H) 10/01/2023   HGBA1C 6.0 03/10/2023    The ASCVD Risk score (Arnett DK, et al., 2019) failed to calculate for the following reasons:   Risk score cannot be calculated because patient has a medical history suggesting prior/existing ASCVD  Background Reviewed: Problem List: has CAD S/P multiple PCIs; Dyslipidemia; HTN (hypertension); Obesity; Unstable angina (HCC); Impaired glucose tolerance; Back pain;  Nonallopathic lesion of thoracic region; Nonallopathic lesion of sacral region; Nonallopathic lesion of lumbosacral region; Gluteal pain; History of adenomatous polyp of colon; BPH (benign prostatic hyperplasia); Vitiligo; Ischemic cardiomyopathy; Perianal irritation; Right carpal tunnel syndrome; Left shoulder pain; AC (acromioclavicular) arthritis; Abnormal  chest x-ray; Cervical disc disorder with radiculopathy of cervical region; Degenerative arthritis of right knee; Coronary artery disease with stable angina pectoris, unspecified vessel or lesion type, unspecified whether native or transplanted heart (HCC); Gluteal tendinitis of right buttock; SI (sacroiliac) joint dysfunction; Achilles tendinosis of left ankle; Acute bursitis of right shoulder; Mixed hyperlipidemia; Benign hypertension; Essential hypertension; Partial nontraumatic tear of right rotator cuff; Vision loss of left eye; GERD (gastroesophageal reflux disease); Stroke [facility]); and Acute CVA (cerebrovascular accident) [facility]) on their problem list. Past Medical History:  has a past medical history of Acute lateral meniscal tear, right, initial encounter (09/26/2021), CAD S/P PCI to Cx & RCA, HTN (hypertension), Hyperlipidemia, Myocardial infarction (HCC) (05/29/2011), and Plantar fasciitis (10/12/2017). Past Surgical History:   has a past surgical history that includes Tonsillectomy and adenoidectomy ( ); Coronary angioplasty with stent (09/22/2004); Coronary angioplasty with stent (09/22/2005); Coronary angioplasty with stent (09/22/2013); Cardiac catheterization (N/A, 05/06/2016); Cardiac catheterization (N/A, 05/06/2016); Cardiac catheterization (N/A, 05/13/2016); LEFT HEART CATH AND CORONARY ANGIOGRAPHY (N/A, 10/02/2023); CORONARY PRESSURE/FFR STUDY (N/A, 10/02/2023); and TRANSESOPHAGEAL ECHOCARDIOGRAM (N/A, 04/29/2024). Social History:   reports that he has never smoked. He has never used smokeless tobacco. He reports that he does not drink alcohol and does not use drugs. Family History:  family history includes Cancer in his brother and brother; Cerebral palsy in his daughter; Dementia in his father; Heart disease (age of onset: 33) in his maternal uncle; Heart disease (age of onset: 79) in his maternal grandfather; Hyperlipidemia in his sister; Hypertension in his father; Stroke in his son. Allergies:   has no known allergies.   Medication Reconciliation: Current Outpatient Medications on File Prior to Visit  Medication Sig   aspirin  81 MG tablet Take 81 mg by mouth daily.   atorvastatin  (LIPITOR ) 80 MG tablet TAKE 1 TABLET DAILY   Coenzyme Q10 (Q-SORB CO Q-10) 100 MG capsule Take 1 capsule by mouth daily.   Cyanocobalamin  (VITAMIN B-12 PO) Take 1 tablet by mouth every other day.   ezetimibe  (ZETIA ) 10 MG tablet Take 1 tablet (10 mg total) by mouth daily.   isosorbide  mononitrate (IMDUR ) 60 MG 24 hr tablet TAKE 1 TABLET DAILY   ranolazine  (RANEXA ) 500 MG 12 hr tablet Take 1 tablet (500 mg total) by mouth 2 (two) times daily.   ticagrelor  (BRILINTA ) 60 MG TABS tablet Take 1 tablet (60 mg total) by mouth 2 (two) times daily.   famotidine  (PEPCID ) 40 MG tablet Take 1 tablet (40 mg total) by mouth daily. (Patient taking differently: Take 40 mg by mouth daily as needed for heartburn or indigestion.)   No current facility-administered medications on file prior to visit.  There are no discontinued medications.   Physical Exam:    05/20/2024   12:53 PM 05/17/2024    5:00 PM 05/17/2024    4:45 PM  Vitals with BMI  Height 5' 6    Weight 193 lbs 13 oz    BMI 31.3    Systolic 120 111 898  Diastolic 66 70 71  Pulse 86 90 87  Vital signs reviewed.  Nursing notes reviewed. Weight trend reviewed. Physical Activity: Sufficiently Active (04/04/2024)   Exercise Vital Sign    Days of Exercise per Week: 2 days  Minutes of Exercise per Session: 90 min   General Appearance:  No acute distress appreciable.   Well-groomed, healthy-appearing male.  Well proportioned with no abnormal fat distribution.  Good muscle tone. Pulmonary:  Normal work of breathing at rest, no respiratory distress apparent. SpO2: 96 %  Musculoskeletal: All extremities are intact.  Neurological:  Awake, alert, oriented, and engaged.  No obvious focal neurological deficits or cognitive impairments.  Sensorium seems unclouded.    Speech is clear and coherent with logical content. Psychiatric:  Appropriate mood, pleasant and cooperative demeanor, thoughtful and engaged during the exam   Verbalized to patient: Physical Exam MEASUREMENTS: Weight- 190.   Results:    05/02/2024   11:04 AM 04/27/2024    7:57 AM 04/04/2024    8:32 AM 04/23/2023    2:11 PM  PHQ 2/9 Scores  PHQ - 2 Score 1 1 0 0  PHQ- 9 Score 1 1  1      Admission on 05/17/2024, Discharged on 05/17/2024  Component Date Value Ref Range Status   Glucose-Capillary 05/17/2024 131 (H)  70 - 99 mg/dL Final   Sodium 91/73/7974 139  135 - 145 mmol/L Final   Potassium 05/17/2024 4.1  3.5 - 5.1 mmol/L Final   Chloride 05/17/2024 102  98 - 111 mmol/L Final   BUN 05/17/2024 20  8 - 23 mg/dL Final   Creatinine, Ser 05/17/2024 1.00  0.61 - 1.24 mg/dL Final   Glucose, Bld 91/73/7974 126 (H)  70 - 99 mg/dL Final   Calcium , Ion 05/17/2024 1.02 (L)  1.15 - 1.40 mmol/L Final   TCO2 05/17/2024 23  22 - 32 mmol/L Final   Hemoglobin 05/17/2024 14.6  13.0 - 17.0 g/dL Final   HCT 91/73/7974 43.0  39.0 - 52.0 % Final   Alcohol, Ethyl (B) 05/17/2024 <15  <15 mg/dL Final   WBC 91/73/7974 7.4  4.0 - 10.5 K/uL Final   RBC 05/17/2024 4.62  4.22 - 5.81 MIL/uL Final   Hemoglobin 05/17/2024 14.9  13.0 - 17.0 g/dL Final   HCT 91/73/7974 43.1  39.0 - 52.0 % Final   MCV 05/17/2024 93.3  80.0 - 100.0 fL Final   MCH 05/17/2024 32.3  26.0 - 34.0 pg Final   MCHC 05/17/2024 34.6  30.0 - 36.0 g/dL Final   RDW 91/73/7974 12.8  11.5 - 15.5 % Final   Platelets 05/17/2024 230  150 - 400 K/uL Final   nRBC 05/17/2024 0.0  0.0 - 0.2 % Final   Neutrophils Relative % 05/17/2024 77  % Final   Neutro Abs 05/17/2024 5.7  1.7 - 7.7 K/uL Final   Lymphocytes Relative 05/17/2024 15  % Final   Lymphs Abs 05/17/2024 1.1  0.7 - 4.0 K/uL Final   Monocytes Relative 05/17/2024 6  % Final   Monocytes Absolute 05/17/2024 0.4  0.1 - 1.0 K/uL Final   Eosinophils Relative 05/17/2024 2  % Final    Eosinophils Absolute 05/17/2024 0.1  0.0 - 0.5 K/uL Final   Basophils Relative 05/17/2024 0  % Final   Basophils Absolute 05/17/2024 0.0  0.0 - 0.1 K/uL Final   Immature Granulocytes 05/17/2024 0  % Final   Abs Immature Granulocytes 05/17/2024 0.03  0.00 - 0.07 K/uL Final   Sodium 05/17/2024 134 (L)  135 - 145 mmol/L Final   Potassium 05/17/2024 4.1  3.5 - 5.1 mmol/L Final   Chloride 05/17/2024 101  98 - 111 mmol/L Final   CO2 05/17/2024 23  22 - 32 mmol/L Final  Glucose, Bld 05/17/2024 131 (H)  70 - 99 mg/dL Final   BUN 91/73/7974 19  8 - 23 mg/dL Final   Creatinine, Ser 05/17/2024 1.06  0.61 - 1.24 mg/dL Final   Calcium  05/17/2024 9.1  8.9 - 10.3 mg/dL Final   Total Protein 91/73/7974 6.8  6.5 - 8.1 g/dL Final   Albumin 91/73/7974 3.9  3.5 - 5.0 g/dL Final   AST 91/73/7974 25  15 - 41 U/L Final   ALT 05/17/2024 27  0 - 44 U/L Final   Alkaline Phosphatase 05/17/2024 66  38 - 126 U/L Final   Total Bilirubin 05/17/2024 1.8 (H)  0.0 - 1.2 mg/dL Final   GFR, Estimated 05/17/2024 >60  >60 mL/min Final   Anion gap 05/17/2024 10  5 - 15 Final  Lab on 05/11/2024  Component Date Value Ref Range Status   WBC 05/11/2024 7.0  4.0 - 10.5 K/uL Final   RBC 05/11/2024 4.48  4.22 - 5.81 Mil/uL Final   Hemoglobin 05/11/2024 14.3  13.0 - 17.0 g/dL Final   HCT 91/79/7974 41.6  39.0 - 52.0 % Final   MCV 05/11/2024 92.9  78.0 - 100.0 fl Final   MCHC 05/11/2024 34.5  30.0 - 36.0 g/dL Final   RDW 91/79/7974 13.3  11.5 - 15.5 % Final   Platelets 05/11/2024 228.0  150.0 - 400.0 K/uL Final   Neutrophils Relative % 05/11/2024 73.0  43.0 - 77.0 % Final   Lymphocytes Relative 05/11/2024 16.1  12.0 - 46.0 % Final   Monocytes Relative 05/11/2024 6.7  3.0 - 12.0 % Final   Eosinophils Relative 05/11/2024 3.2  0.0 - 5.0 % Final   Basophils Relative 05/11/2024 1.0  0.0 - 3.0 % Final   Neutro Abs 05/11/2024 5.1  1.4 - 7.7 K/uL Final   Lymphs Abs 05/11/2024 1.1  0.7 - 4.0 K/uL Final   Monocytes Absolute 05/11/2024  0.5  0.1 - 1.0 K/uL Final   Eosinophils Absolute 05/11/2024 0.2  0.0 - 0.7 K/uL Final   Basophils Absolute 05/11/2024 0.1  0.0 - 0.1 K/uL Final   Sodium 05/11/2024 141  135 - 145 mEq/L Final   Potassium 05/11/2024 3.9  3.5 - 5.1 mEq/L Final   Chloride 05/11/2024 106  96 - 112 mEq/L Final   CO2 05/11/2024 27  19 - 32 mEq/L Final   Glucose, Bld 05/11/2024 104 (H)  70 - 99 mg/dL Final   BUN 91/79/7974 20  6 - 23 mg/dL Final   Creatinine, Ser 05/11/2024 0.94  0.40 - 1.50 mg/dL Final   GFR 91/79/7974 82.91  >60.00 mL/min Final   Calcium  05/11/2024 8.8  8.4 - 10.5 mg/dL Final   Cholesterol 91/79/7974 93  0 - 200 mg/dL Final   Triglycerides 91/79/7974 70.0  0.0 - 149.0 mg/dL Final   HDL 91/79/7974 47.20  >39.00 mg/dL Final   VLDL 91/79/7974 14.0  0.0 - 40.0 mg/dL Final   LDL Cholesterol 05/11/2024 31  0 - 99 mg/dL Final   Total CHOL/HDL Ratio 05/11/2024 2   Final   NonHDL 05/11/2024 45.31   Final   Sodium 05/11/2024 141  135 - 145 mEq/L Final   Potassium 05/11/2024 3.9  3.5 - 5.1 mEq/L Final   Chloride 05/11/2024 106  96 - 112 mEq/L Final   CO2 05/11/2024 27  19 - 32 mEq/L Final   Glucose, Bld 05/11/2024 104 (H)  70 - 99 mg/dL Final   BUN 91/79/7974 20  6 - 23 mg/dL Final   Creatinine,  Ser 05/11/2024 0.94  0.40 - 1.50 mg/dL Final   Total Bilirubin 05/11/2024 1.4 (H)  0.2 - 1.2 mg/dL Final   Alkaline Phosphatase 05/11/2024 63  39 - 117 U/L Final   AST 05/11/2024 19  0 - 37 U/L Final   ALT 05/11/2024 19  0 - 53 U/L Final   Total Protein 05/11/2024 6.1  6.0 - 8.3 g/dL Final   Albumin 91/79/7974 4.0  3.5 - 5.2 g/dL Final   GFR 91/79/7974 82.91  >60.00 mL/min Final   Calcium  05/11/2024 8.8  8.4 - 10.5 mg/dL Final  Admission on 91/93/7974, Discharged on 04/29/2024  Component Date Value Ref Range Status   Glucose-Capillary 04/27/2024 75  70 - 99 mg/dL Final   Prothrombin Time 04/27/2024 13.3  11.4 - 15.2 seconds Final   INR 04/27/2024 1.0  0.8 - 1.2 Final   aPTT 04/27/2024 25  24 - 36  seconds Final   WBC 04/27/2024 9.2  4.0 - 10.5 K/uL Final   RBC 04/27/2024 4.30  4.22 - 5.81 MIL/uL Final   Hemoglobin 04/27/2024 13.9  13.0 - 17.0 g/dL Final   HCT 91/93/7974 39.8  39.0 - 52.0 % Final   MCV 04/27/2024 92.6  80.0 - 100.0 fL Final   MCH 04/27/2024 32.3  26.0 - 34.0 pg Final   MCHC 04/27/2024 34.9  30.0 - 36.0 g/dL Final   RDW 91/93/7974 12.7  11.5 - 15.5 % Final   Platelets 04/27/2024 255  150 - 400 K/uL Final   nRBC 04/27/2024 0.0  0.0 - 0.2 % Final   Neutrophils Relative % 04/27/2024 73  % Final   Neutro Abs 04/27/2024 6.6  1.7 - 7.7 K/uL Final   Lymphocytes Relative 04/27/2024 17  % Final   Lymphs Abs 04/27/2024 1.6  0.7 - 4.0 K/uL Final   Monocytes Relative 04/27/2024 9  % Final   Monocytes Absolute 04/27/2024 0.8  0.1 - 1.0 K/uL Final   Eosinophils Relative 04/27/2024 1  % Final   Eosinophils Absolute 04/27/2024 0.1  0.0 - 0.5 K/uL Final   Basophils Relative 04/27/2024 0  % Final   Basophils Absolute 04/27/2024 0.0  0.0 - 0.1 K/uL Final   Immature Granulocytes 04/27/2024 0  % Final   Abs Immature Granulocytes 04/27/2024 0.02  0.00 - 0.07 K/uL Final   Sodium 04/27/2024 140  135 - 145 mmol/L Final   Potassium 04/27/2024 3.5  3.5 - 5.1 mmol/L Final   Chloride 04/27/2024 103  98 - 111 mmol/L Final   CO2 04/27/2024 26  22 - 32 mmol/L Final   Glucose, Bld 04/27/2024 77  70 - 99 mg/dL Final   BUN 91/93/7974 16  8 - 23 mg/dL Final   Creatinine, Ser 04/27/2024 1.03  0.61 - 1.24 mg/dL Final   Calcium  04/27/2024 9.1  8.9 - 10.3 mg/dL Final   Total Protein 91/93/7974 6.4 (L)  6.5 - 8.1 g/dL Final   Albumin 91/93/7974 3.9  3.5 - 5.0 g/dL Final   AST 91/93/7974 29  15 - 41 U/L Final   ALT 04/27/2024 29  0 - 44 U/L Final   Alkaline Phosphatase 04/27/2024 84  38 - 126 U/L Final   Total Bilirubin 04/27/2024 1.0  0.0 - 1.2 mg/dL Final   GFR, Estimated 04/27/2024 >60  >60 mL/min Final   Anion gap 04/27/2024 11  5 - 15 Final   Alcohol, Ethyl (B) 04/27/2024 <15  <15 mg/dL Final    Cholesterol 91/92/7974 94  0 - 200 mg/dL Final  Triglycerides 04/28/2024 54  <150 mg/dL Final   HDL 91/92/7974 41  >40 mg/dL Final   Total CHOL/HDL Ratio 04/28/2024 2.3  RATIO Final   VLDL 04/28/2024 11  0 - 40 mg/dL Final   LDL Cholesterol 04/28/2024 42  0 - 99 mg/dL Final   WBC 91/92/7974 7.4  4.0 - 10.5 K/uL Final   RBC 04/28/2024 4.07 (L)  4.22 - 5.81 MIL/uL Final   Hemoglobin 04/28/2024 13.1  13.0 - 17.0 g/dL Final   HCT 91/92/7974 37.4 (L)  39.0 - 52.0 % Final   MCV 04/28/2024 91.9  80.0 - 100.0 fL Final   MCH 04/28/2024 32.2  26.0 - 34.0 pg Final   MCHC 04/28/2024 35.0  30.0 - 36.0 g/dL Final   RDW 91/92/7974 12.5  11.5 - 15.5 % Final   Platelets 04/28/2024 236  150 - 400 K/uL Final   nRBC 04/28/2024 0.0  0.0 - 0.2 % Final   Creatinine, Ser 04/28/2024 1.09  0.61 - 1.24 mg/dL Final   GFR, Estimated 04/28/2024 >60  >60 mL/min Final   Est EF 04/29/2024 60 - 65%   Final  Admission on 04/20/2024, Discharged on 04/22/2024  Component Date Value Ref Range Status   Glucose-Capillary 04/20/2024 103 (H)  70 - 99 mg/dL Final   WBC 92/69/7974 6.7  4.0 - 10.5 K/uL Final   RBC 04/20/2024 4.55  4.22 - 5.81 MIL/uL Final   Hemoglobin 04/20/2024 14.8  13.0 - 17.0 g/dL Final   HCT 92/69/7974 42.0  39.0 - 52.0 % Final   MCV 04/20/2024 92.3  80.0 - 100.0 fL Final   MCH 04/20/2024 32.5  26.0 - 34.0 pg Final   MCHC 04/20/2024 35.2  30.0 - 36.0 g/dL Final   RDW 92/69/7974 12.5  11.5 - 15.5 % Final   Platelets 04/20/2024 243  150 - 400 K/uL Final   nRBC 04/20/2024 0.0  0.0 - 0.2 % Final   Neutrophils Relative % 04/20/2024 65  % Final   Neutro Abs 04/20/2024 4.4  1.7 - 7.7 K/uL Final   Lymphocytes Relative 04/20/2024 24  % Final   Lymphs Abs 04/20/2024 1.6  0.7 - 4.0 K/uL Final   Monocytes Relative 04/20/2024 8  % Final   Monocytes Absolute 04/20/2024 0.5  0.1 - 1.0 K/uL Final   Eosinophils Relative 04/20/2024 2  % Final   Eosinophils Absolute 04/20/2024 0.1  0.0 - 0.5 K/uL Final   Basophils  Relative 04/20/2024 1  % Final   Basophils Absolute 04/20/2024 0.0  0.0 - 0.1 K/uL Final   Immature Granulocytes 04/20/2024 0  % Final   Abs Immature Granulocytes 04/20/2024 0.02  0.00 - 0.07 K/uL Final   Sodium 04/20/2024 141  135 - 145 mmol/L Final   Potassium 04/20/2024 4.0  3.5 - 5.1 mmol/L Final   Chloride 04/20/2024 102  98 - 111 mmol/L Final   CO2 04/20/2024 27  22 - 32 mmol/L Final   Glucose, Bld 04/20/2024 109 (H)  70 - 99 mg/dL Final   BUN 92/69/7974 23  8 - 23 mg/dL Final   Creatinine, Ser 04/20/2024 1.18  0.61 - 1.24 mg/dL Final   Calcium  04/20/2024 9.3  8.9 - 10.3 mg/dL Final   Total Protein 92/69/7974 6.6  6.5 - 8.1 g/dL Final   Albumin 92/69/7974 4.1  3.5 - 5.0 g/dL Final   AST 92/69/7974 21  15 - 41 U/L Final   ALT 04/20/2024 14  0 - 44 U/L Final   Alkaline Phosphatase 04/20/2024  78  38 - 126 U/L Final   Total Bilirubin 04/20/2024 0.9  0.0 - 1.2 mg/dL Final   GFR, Estimated 04/20/2024 >60  >60 mL/min Final   Anion gap 04/20/2024 12  5 - 15 Final   Prothrombin Time 04/20/2024 13.4  11.4 - 15.2 seconds Final   INR 04/20/2024 1.0  0.8 - 1.2 Final   Sed Rate 04/20/2024 10  0 - 16 mm/hr Final   CRP 04/20/2024 0.7  <1.0 mg/dL Final   Cholesterol 92/68/7974 187  0 - 200 mg/dL Final   Triglycerides 92/68/7974 74  <150 mg/dL Final   HDL 92/68/7974 51  >40 mg/dL Final   Total CHOL/HDL Ratio 04/21/2024 3.7  RATIO Final   VLDL 04/21/2024 15  0 - 40 mg/dL Final   LDL Cholesterol 04/21/2024 121 (H)  0 - 99 mg/dL Final   Hgb J8r MFr Bld 04/21/2024 5.4  4.8 - 5.6 % Final   Mean Plasma Glucose 04/21/2024 108.28  mg/dL Final   Weight 92/68/7974 3,072.01  oz Final   Height 04/21/2024 66  in Final   BP 04/21/2024 133/74  mmHg Final   Single Plane A2C EF 04/21/2024 50.9  % Final   Single Plane A4C EF 04/21/2024 48.6  % Final   Calc EF 04/21/2024 50.4  % Final   S' Lateral 04/21/2024 3.98  cm Final   Area-P 1/2 04/21/2024 3.42  cm2 Final   Est EF 04/21/2024 55 - 60%   Final   WBC  04/21/2024 8.3  4.0 - 10.5 K/uL Final   RBC 04/21/2024 4.53  4.22 - 5.81 MIL/uL Final   Hemoglobin 04/21/2024 14.7  13.0 - 17.0 g/dL Final   HCT 92/68/7974 41.6  39.0 - 52.0 % Final   MCV 04/21/2024 91.8  80.0 - 100.0 fL Final   MCH 04/21/2024 32.5  26.0 - 34.0 pg Final   MCHC 04/21/2024 35.3  30.0 - 36.0 g/dL Final   RDW 92/68/7974 12.3  11.5 - 15.5 % Final   Platelets 04/21/2024 245  150 - 400 K/uL Final   nRBC 04/21/2024 0.0  0.0 - 0.2 % Final   Creatinine, Ser 04/21/2024 1.00  0.61 - 1.24 mg/dL Final   GFR, Estimated 04/21/2024 >60  >60 mL/min Final   Sodium 04/22/2024 139  135 - 145 mmol/L Final   Potassium 04/22/2024 4.1  3.5 - 5.1 mmol/L Final   Chloride 04/22/2024 103  98 - 111 mmol/L Final   CO2 04/22/2024 27  22 - 32 mmol/L Final   Glucose, Bld 04/22/2024 116 (H)  70 - 99 mg/dL Final   BUN 91/98/7974 16  8 - 23 mg/dL Final   Creatinine, Ser 04/22/2024 1.11  0.61 - 1.24 mg/dL Final   Calcium  04/22/2024 9.0  8.9 - 10.3 mg/dL Final   GFR, Estimated 04/22/2024 >60  >60 mL/min Final   Anion gap 04/22/2024 9  5 - 15 Final   WBC 04/22/2024 6.8  4.0 - 10.5 K/uL Final   RBC 04/22/2024 4.21 (L)  4.22 - 5.81 MIL/uL Final   Hemoglobin 04/22/2024 13.6  13.0 - 17.0 g/dL Final   HCT 91/98/7974 39.0  39.0 - 52.0 % Final   MCV 04/22/2024 92.6  80.0 - 100.0 fL Final   MCH 04/22/2024 32.3  26.0 - 34.0 pg Final   MCHC 04/22/2024 34.9  30.0 - 36.0 g/dL Final   RDW 91/98/7974 12.4  11.5 - 15.5 % Final   Platelets 04/22/2024 235  150 - 400 K/uL Final  nRBC 04/22/2024 0.0  0.0 - 0.2 % Final   Neutrophils Relative % 04/22/2024 72  % Final   Neutro Abs 04/22/2024 4.9  1.7 - 7.7 K/uL Final   Lymphocytes Relative 04/22/2024 19  % Final   Lymphs Abs 04/22/2024 1.3  0.7 - 4.0 K/uL Final   Monocytes Relative 04/22/2024 8  % Final   Monocytes Absolute 04/22/2024 0.6  0.1 - 1.0 K/uL Final   Eosinophils Relative 04/22/2024 1  % Final   Eosinophils Absolute 04/22/2024 0.1  0.0 - 0.5 K/uL Final    Basophils Relative 04/22/2024 0  % Final   Basophils Absolute 04/22/2024 0.0  0.0 - 0.1 K/uL Final   Immature Granulocytes 04/22/2024 0  % Final   Abs Immature Granulocytes 04/22/2024 0.02  0.00 - 0.07 K/uL Final  Admission on 10/01/2023, Discharged on 10/03/2023  Component Date Value Ref Range Status   Sodium 10/01/2023 139  135 - 145 mmol/L Final   Potassium 10/01/2023 3.9  3.5 - 5.1 mmol/L Final   Chloride 10/01/2023 103  98 - 111 mmol/L Final   CO2 10/01/2023 24  22 - 32 mmol/L Final   Glucose, Bld 10/01/2023 115 (H)  70 - 99 mg/dL Final   BUN 98/90/7974 19  8 - 23 mg/dL Final   Creatinine, Ser 10/01/2023 0.89  0.61 - 1.24 mg/dL Final   Calcium  10/01/2023 8.9  8.9 - 10.3 mg/dL Final   GFR, Estimated 10/01/2023 >60  >60 mL/min Final   Anion gap 10/01/2023 12  5 - 15 Final   WBC 10/01/2023 7.0  4.0 - 10.5 K/uL Final   RBC 10/01/2023 4.58  4.22 - 5.81 MIL/uL Final   Hemoglobin 10/01/2023 14.6  13.0 - 17.0 g/dL Final   HCT 98/90/7974 42.1  39.0 - 52.0 % Final   MCV 10/01/2023 91.9  80.0 - 100.0 fL Final   MCH 10/01/2023 31.9  26.0 - 34.0 pg Final   MCHC 10/01/2023 34.7  30.0 - 36.0 g/dL Final   RDW 98/90/7974 13.2  11.5 - 15.5 % Final   Platelets 10/01/2023 230  150 - 400 K/uL Final   nRBC 10/01/2023 0.0  0.0 - 0.2 % Final   Troponin I (High Sensitivity) 10/01/2023 6  <18 ng/L Final   Magnesium 10/01/2023 1.8  1.7 - 2.4 mg/dL Final   Troponin I (High Sensitivity) 10/01/2023 5  <18 ng/L Final   WBC 10/01/2023 7.6  4.0 - 10.5 K/uL Final   RBC 10/01/2023 4.58  4.22 - 5.81 MIL/uL Final   Hemoglobin 10/01/2023 14.6  13.0 - 17.0 g/dL Final   HCT 98/90/7974 41.6  39.0 - 52.0 % Final   MCV 10/01/2023 90.8  80.0 - 100.0 fL Final   MCH 10/01/2023 31.9  26.0 - 34.0 pg Final   MCHC 10/01/2023 35.1  30.0 - 36.0 g/dL Final   RDW 98/90/7974 13.0  11.5 - 15.5 % Final   Platelets 10/01/2023 216  150 - 400 K/uL Final   nRBC 10/01/2023 0.0  0.0 - 0.2 % Final   Creatinine, Ser 10/01/2023 0.92   0.61 - 1.24 mg/dL Final   GFR, Estimated 10/01/2023 >60  >60 mL/min Final   Prothrombin Time 10/01/2023 14.0  11.4 - 15.2 seconds Final   INR 10/01/2023 1.1  0.8 - 1.2 Final   Hgb A1c MFr Bld 10/01/2023 6.2 (H)  4.8 - 5.6 % Final   Mean Plasma Glucose 10/01/2023 131.24  mg/dL Final   Weight 98/89/7974 3,072  oz Final   Height  10/02/2023 66  in Final   BP 10/02/2023 124/76  mmHg Final   S' Lateral 10/02/2023 4.50  cm Final   AR max vel 10/02/2023 2.67  cm2 Final   AV Area VTI 10/02/2023 2.99  cm2 Final   AV Mean grad 10/02/2023 3.0  mmHg Final   AV Peak grad 10/02/2023 4.8  mmHg Final   Ao pk vel 10/02/2023 1.09  m/s Final   Area-P 1/2 10/02/2023 3.31  cm2 Final   AV Area mean vel 10/02/2023 2.71  cm2 Final   Est EF 10/02/2023 45 - 50%   Final   Lipoprotein (a) 10/02/2023 19.4  <75.0 nmol/L Final   Sodium 10/02/2023 142  135 - 145 mmol/L Final   Potassium 10/02/2023 3.8  3.5 - 5.1 mmol/L Final   Chloride 10/02/2023 105  98 - 111 mmol/L Final   CO2 10/02/2023 27  22 - 32 mmol/L Final   Glucose, Bld 10/02/2023 100 (H)  70 - 99 mg/dL Final   BUN 98/89/7974 13  8 - 23 mg/dL Final   Creatinine, Ser 10/02/2023 0.98  0.61 - 1.24 mg/dL Final   Calcium  10/02/2023 9.4  8.9 - 10.3 mg/dL Final   GFR, Estimated 10/02/2023 >60  >60 mL/min Final   Anion gap 10/02/2023 10  5 - 15 Final   Cholesterol 10/02/2023 122  0 - 200 mg/dL Final   Triglycerides 98/89/7974 90  <150 mg/dL Final   HDL 98/89/7974 49  >40 mg/dL Final   Total CHOL/HDL Ratio 10/02/2023 2.5  RATIO Final   VLDL 10/02/2023 18  0 - 40 mg/dL Final   LDL Cholesterol 10/02/2023 55  0 - 99 mg/dL Final   WBC 98/89/7974 6.8  4.0 - 10.5 K/uL Final   RBC 10/02/2023 4.60  4.22 - 5.81 MIL/uL Final   Hemoglobin 10/02/2023 14.8  13.0 - 17.0 g/dL Final   HCT 98/89/7974 41.8  39.0 - 52.0 % Final   MCV 10/02/2023 90.9  80.0 - 100.0 fL Final   MCH 10/02/2023 32.2  26.0 - 34.0 pg Final   MCHC 10/02/2023 35.4  30.0 - 36.0 g/dL Final   RDW  98/89/7974 13.2  11.5 - 15.5 % Final   Platelets 10/02/2023 238  150 - 400 K/uL Final   nRBC 10/02/2023 0.0  0.0 - 0.2 % Final   HIV Screen 4th Generation wRfx 10/01/2023 Non Reactive  Non Reactive Final   Activated Clotting Time 10/02/2023 285  seconds Final  Office Visit on 04/23/2023  Component Date Value Ref Range Status   Sed Rate 04/23/2023 9  0 - 20 mm/hr Final   CRP 04/23/2023 <1.0  0.5 - 20.0 mg/dL Final   PSA 04/23/2023 1.74  0.10 - 4.00 ng/ml Final  Abstract on 04/23/2023  Component Date Value Ref Range Status   Vit D, 25-Hydroxy 03/10/2023 44.7   Final   Vitamin B-12 03/10/2023 229   Final   Hemoglobin A1C 03/10/2023 6.0   Final  No image results found. MR BRAIN WO CONTRAST Result Date: 05/17/2024 CLINICAL DATA:  Dizziness EXAM: MRI HEAD WITHOUT CONTRAST TECHNIQUE: Multiplanar, multiecho pulse sequences of the brain and surrounding structures were obtained without intravenous contrast. COMPARISON:  April 28, 2024 FINDINGS: MRI brain: There is a small old infarct in the medial right occipital lobe with encephalomalacia. No acute infarct. No other significant signal abnormality in the brain parenchyma. The ventricles are normal. No mass lesion. There are normal flow signals in the carotid arteries and basilar artery. No significant bone  marrow signal abnormality. No significant abnormality in the paranasal sinuses or soft tissues. 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 No acute abnormality. Electronically Signed   By: Nancyann Burns M.D.   On: 05/17/2024 16:31   CT HEAD CODE STROKE WO CONTRAST Result Date: 05/17/2024 EXAM: CT HEAD WITHOUT CONTRAST 05/17/2024 01:38:12 PM TECHNIQUE: CT of the head was performed without the administration of intravenous contrast. Automated exposure control, iterative reconstruction, and/or weight based adjustment of the mA/kV was utilized to reduce the radiation dose to as low as reasonably achievable. COMPARISON:  Head CT 04/27/2024 and MRI 04/28/2024. CLINICAL HISTORY: Neuro deficit, acute, stroke suspected. Patient reports dizziness starting at 1130, progressing to left arm numbness now. Family reports he's having difficulty finding words, recently has had similar symptoms and treated for stroke. A\T\Ox4, moving to bridge for assessment at this time. FINDINGS: BRAIN AND VENTRICLES: No acute hemorrhage. Gray-white differentiation is preserved. No hydrocephalus. No extra-axial collection. No mass effect or midline shift. Small chronic infarct in the medial right occipital lobe. The additional small acute right occipital infarcts on the prior MRI are not well shown by CT. No hyperdense vessel. ORBITS: No acute abnormality. SINUSES: No acute abnormality. SOFT TISSUES AND SKULL: No acute soft tissue abnormality. No skull fracture. The pertinent results were texted to Dr. Matthews via the [facility] system at 1:50 pm. IMPRESSION: 1. No acute intracranial abnormality. ASPECTS of 10. 2. Small chronic right occipital infarct. Electronically signed by: Dasie Hamburg MD 05/17/2024 01:52 PM EDT RP Workstation: [id number]   ECHO TEE Result Date: 04/29/2024    TRANSESOPHOGEAL ECHO REPORT   Patient Name:   Michael [name] Date of Exam: 04/29/2024 Medical Rec #:  [id number]                 Height:       66.0 in Accession #:    [id number]                Weight:       195.1 lb Date of Birth:  07/23/1956                 BSA:          1.979 m Patient Age:    68 years                  BP:           127/67 mmHg Patient Gender: M                         HR:           62 bpm. Exam Location:  Inpatient Procedure: Transesophageal Echo, Limited Color Doppler, Cardiac Doppler and            Saline Contrast Bubble Study (Both Spectral and Color Flow Doppler            were utilized during procedure). Indications:     TIA  History:         Patient has prior history of Echocardiogram examinations, most                  recent 04/21/2024. CAD, TIA; Risk  Factors:Hypertension and                  Dyslipidemia.  Sonographer:     Koleen Popper RDCS Referring Phys:  [id number] ARTIST POUCH Diagnosing Phys: Maude Emmer MD PROCEDURE: After discussion of  the risks and benefits of a TEE, an informed consent was obtained from the patient. The transesophogeal probe was passed without difficulty through the esophogus of the patient. Imaged were obtained with the patient in a left lateral decubitus position. Sedation performed by different physician. The patient was monitored while under deep sedation. Anesthestetic sedation was provided intravenously by Anesthesiology: 128mg  of Propofol , 60mg  of Lidocaine . Image quality was good. The patient's vital signs; including heart rate, blood pressure, and oxygen saturation; remained stable throughout the procedure. The patient developed no complications during the procedure.  IMPRESSIONS  1. Now cardiac source of embolus.  2. Left ventricular ejection fraction, by estimation, is 60 to 65%. The left ventricle has normal function. The left ventricle has no regional wall motion abnormalities.  3. Right ventricular systolic function is normal. The right ventricular size is normal.  4. Left atrial size was mildly dilated. No left atrial/left atrial appendage thrombus was detected.  5. The mitral valve is abnormal. Mild mitral valve regurgitation. No evidence of mitral stenosis.  6. The aortic valve is tricuspid. There is mild calcification of the aortic valve. Aortic valve regurgitation is not visualized. Aortic valve sclerosis is present, with no evidence of aortic valve stenosis.  7. The inferior vena cava is normal in size with greater than 50% respiratory variability, suggesting right atrial pressure of 3 mmHg.  8. Agitated saline contrast bubble study was negative, with no evidence of any interatrial shunt.  9. None and demonstrates None. Conclusion(s)/Recommendation(s): Normal biventricular function without evidence of  hemodynamically significant valvular heart disease. FINDINGS  Left Ventricle: Left ventricular ejection fraction, by estimation, is 60 to 65%. The left ventricle has normal function. The left ventricle has no regional wall motion abnormalities. The left ventricular internal cavity size was normal in size. There is  no left ventricular hypertrophy. Right Ventricle: The right ventricular size is normal. No increase in right ventricular wall thickness. Right ventricular systolic function is normal. Left Atrium: Left atrial size was mildly dilated. No left atrial/left atrial appendage thrombus was detected. Right Atrium: Right atrial size was normal in size. Pericardium: There is no evidence of pericardial effusion. Mitral Valve: The mitral valve is abnormal. There is mild thickening of the mitral valve leaflet(s). Mild mitral valve regurgitation. No evidence of mitral valve stenosis. Tricuspid Valve: The tricuspid valve is normal in structure. Tricuspid valve regurgitation is not demonstrated. No evidence of tricuspid stenosis. Aortic Valve: The aortic valve is tricuspid. There is mild calcification of the aortic valve. Aortic valve regurgitation is not visualized. Aortic valve sclerosis is present, with no evidence of aortic valve stenosis. Pulmonic Valve: The pulmonic valve was normal in structure. Pulmonic valve regurgitation is not visualized. No evidence of pulmonic stenosis. Aorta: The aortic root is normal in size and structure. Venous: The inferior vena cava is normal in size with greater than 50% respiratory variability, suggesting right atrial pressure of 3 mmHg. IAS/Shunts: No atrial level shunt detected by color flow Doppler. Agitated saline contrast was given intravenously to evaluate for intracardiac shunting. Agitated saline contrast bubble study was negative, with no evidence of any interatrial shunt. Additional Comments: Now cardiac source of embolus. 3D was performed not requiring image post  processing on an independent workstation and was indeterminate. Spectral Doppler performed. LEFT VENTRICLE PLAX 2D LVOT diam:     2.00 cm LVOT Area:     3.14 cm   AORTA Ao Root diam: 3.10 cm Ao Asc diam:  2.90 cm  SHUNTS Systemic Diam: 2.00 cm  Maude Emmer MD Electronically signed by Maude Emmer MD Signature Date/Time: 04/29/2024/1:37:36 PM    Final    EP STUDY Result Date: 04/29/2024 See surgical note for result.  MR BRAIN WO CONTRAST Result Date: 04/28/2024 EXAM: MRI BRAIN WITHOUT CONTRAST 04/28/2024 12:37:00 AM TECHNIQUE: Multiplanar multisequence MRI of the head/brain was performed without the administration of intravenous contrast. COMPARISON: CT head without contrast 04/27/2024. MR head without contrast 04/21/2024. CLINICAL HISTORY: Neuro deficit, acute, stroke suspected. Crescentic left homonymous hemianopsia. FINDINGS: BRAIN AND VENTRICLES: Focal infarct along the inferior posterior right occipital lobe is again noted. Additional new punctate cortical infarcts are present slightly more laterally and posteriorly in the right occipital lobe. No acute Perihilar infarcts are present. Remote medial right occipital lobe infarct is again seen. T2 and FLAIR signal changes about the previously noted inferior right occipital infarct have increased slightly. No acute hemorrhage is present. Periventricular and other scattered subcortical T2 hyperintensities are mildly advanced for age. These are otherwise stable. No mass. No midline shift. No hydrocephalus. The sella is unremarkable. Normal flow voids. ORBITS: No acute abnormality. SINUSES AND MASTOIDS: No acute abnormality. BONES AND SOFT TISSUES: Normal marrow signal. No acute soft tissue abnormality. IMPRESSION: 1. Additional new punctate cortical infarcts in the right occipital lobe, slightly more lateral and posterior. 2. Focal infarct along the inferior posterior right occipital lobe, again noted, with slightly increased T2/FLAIR signal changes. 3. No acute  hemorrhage. 4. Mildly advanced periventricular and other scattered subcortical T2 hyperintensities, stable. This most likely reflects the sequelae of chronic microvascular ischemia. Electronically signed by: Lonni Necessary MD 04/28/2024 04:22 AM EDT RP Workstation: [id number]   CT HEAD CODE STROKE WO CONTRAST Result Date: 04/27/2024 EXAM: CT HEAD WITHOUT 04/27/2024 12:23:30 PM TECHNIQUE: CT of the head was performed without the administration of intravenous contrast. Automated exposure control, iterative reconstruction, and/or weight based adjustment of the mA/kV was utilized to reduce the radiation dose to as low as reasonably achievable. COMPARISON: CT of the head dated 04/20/2024. CLINICAL HISTORY: Neuro deficit, acute, stroke suspected. LKW- approx 1 hr ago; Left arm/leg tingling, Brain feels weird, can't think straight; Stroke last week; Zackowski- [phone]. FINDINGS: BRAIN AND VENTRICLES: No acute intracranial hemorrhage. No mass effect or midline shift. No extra-axial fluid collection. Gray-white differentiation is maintained. No hydrocephalus. Chronic encephalomalacia changes again demonstrated medially within the right occipital lobe. Aspect score is 10. ORBITS: No acute abnormality. SINUSES AND MASTOIDS: No acute abnormality. SOFT TISSUES AND SKULL: No acute skull fracture. No acute soft tissue abnormality. IMPRESSION: 1. No acute intracranial abnormality. 2. Chronic encephalomalacia changes medially within the right occipital lobe, stable compared to prior study dated 04/20/2024. 3. The above findings were discussed with Dr. Zackowski at 12:28 PM on 04/27/2024. Electronically signed by: evalene coho 04/27/2024 12:32 PM EDT RP Workstation: [id number]   MR BRAIN WO CONTRAST Result Date: 04/21/2024 EXAM: MRI BRAIN WITHOUT CONTRAST 04/21/2024 07:49:04 PM TECHNIQUE: Multiplanar multisequence MRI of the head/brain was performed without the administration of intravenous contrast. COMPARISON:  CTA head and neck dated 04/20/2024. CLINICAL HISTORY: Transient ischemic attack (TIA). Vision loss. FINDINGS: BRAIN AND VENTRICLES: There is an 8 mm focus of restricted diffusion within the inferior right occipital cortex compatible with acute infarct. Additional remote infarct in the medial right occipital lobe. Mild T2/FLAIR hyperintensity in the periventricular and subcortical white matter compatible with mild chronic microvascular ischemic changes. No intracranial hemorrhage. No mass. No midline shift. No hydrocephalus. The sella is unremarkable. Normal flow voids. ORBITS: No acute abnormality. SINUSES AND MASTOIDS: There  is mild mucosal thickening in the inferior left maxillary sinus. No acute abnormality in the remaining sinuses and mastoids. BONES AND SOFT TISSUES: Normal marrow signal. No acute soft tissue abnormality. IMPRESSION: 1. Acute infarct in the inferior right occipital cortex. 2. Remote infarct in the medial right occipital lobe. 3. Mild chronic microvascular ischemic changes. Electronically signed by: Donnice Mania MD 04/21/2024 08:42 PM EDT RP Workstation: [id number]   ECHOCARDIOGRAM COMPLETE Result Date: 04/21/2024    ECHOCARDIOGRAM REPORT   Patient Name:   Michael [name] Date of Exam: 04/21/2024 Medical Rec #:  [id number]                 Height:       66.0 in Accession #:    [id number]                Weight:       192.0 lb Date of Birth:  1956-04-14                 BSA:          1.965 m Patient Age:    68 years                  BP:           133/74 mmHg Patient Gender: M                         HR:           72 bpm. Exam Location:  Inpatient Procedure: 2D Echo, 3D Echo, Cardiac Doppler, Color Doppler and Strain Analysis            (Both Spectral and Color Flow Doppler were utilized during            procedure). Indications:    TIA  History:        Patient has prior history of Echocardiogram examinations, most                 recent 10/02/2023. CAD; Risk Factors:Hypertension and                  Dyslipidemia.  Sonographer:    Philomena Daring Referring Phys: [id number] VASUNDHRA RATHORE IMPRESSIONS  1. Left ventricular ejection fraction, by estimation, is 55 to 60%. The left ventricle has normal function. The left ventricle has no regional wall motion abnormalities. Left ventricular diastolic parameters are consistent with Grade I diastolic dysfunction (impaired relaxation).  2. Right ventricular systolic function is normal. The right ventricular size is normal.  3. The mitral valve is normal in structure. Trivial mitral valve regurgitation. No evidence of mitral stenosis.  4. The aortic valve is tricuspid. There is mild calcification of the aortic valve. Aortic valve regurgitation is not visualized. Aortic valve sclerosis/calcification is present, without any evidence of aortic stenosis.  5. Agitated saline contrast bubble study was negative, with no evidence of any interatrial shunt. FINDINGS  Left Ventricle: Left ventricular ejection fraction, by estimation, is 55 to 60%. The left ventricle has normal function. The left ventricle has no regional wall motion abnormalities. Strain was performed and the global longitudinal strain is indeterminate. The left ventricular internal cavity size was normal in size. There is no left ventricular hypertrophy. Left ventricular diastolic parameters are consistent with Grade I diastolic dysfunction (impaired relaxation). Right Ventricle: The right ventricular size is normal. No increase in right ventricular wall thickness. Right ventricular systolic function is normal. Left Atrium: Left atrial  size was normal in size. Right Atrium: Right atrial size was normal in size. Pericardium: There is no evidence of pericardial effusion. Mitral Valve: The mitral valve is normal in structure. Trivial mitral valve regurgitation. No evidence of mitral valve stenosis. Tricuspid Valve: The tricuspid valve is normal in structure. Tricuspid valve regurgitation is trivial. No evidence  of tricuspid stenosis. Aortic Valve: The aortic valve is tricuspid. There is mild calcification of the aortic valve. Aortic valve regurgitation is not visualized. Aortic valve sclerosis/calcification is present, without any evidence of aortic stenosis. Pulmonic Valve: The pulmonic valve was normal in structure. Pulmonic valve regurgitation is trivial. No evidence of pulmonic stenosis. Aorta: The aortic root is normal in size and structure. Venous: The inferior vena cava was not well visualized. IAS/Shunts: No atrial level shunt detected by color flow Doppler. Agitated saline contrast was given intravenously to evaluate for intracardiac shunting. Agitated saline contrast bubble study was negative, with no evidence of any interatrial shunt.  LEFT VENTRICLE PLAX 2D LVIDd:         4.75 cm      Diastology LVIDs:         3.98 cm      LV e' medial:    6.09 cm/s LV PW:         1.05 cm      LV E/e' medial:  10.1 LV IVS:        1.03 cm      LV e' lateral:   7.40 cm/s LVOT diam:     2.12 cm      LV E/e' lateral: 8.3 LV SV:         74 LV SV Index:   38 LVOT Area:     3.53 cm  LV Volumes (MOD) LV vol d, MOD A2C: 81.0 ml LV vol d, MOD A4C: 109.0 ml LV vol s, MOD A2C: 39.8 ml LV vol s, MOD A4C: 56.0 ml LV SV MOD A2C:     41.2 ml LV SV MOD A4C:     109.0 ml LV SV MOD BP:      49.1 ml RIGHT VENTRICLE RV S prime:     10.70 cm/s TAPSE (M-mode): 1.8 cm LEFT ATRIUM             Index        RIGHT ATRIUM           Index LA diam:        3.04 cm 1.55 cm/m   RA Area:     12.30 cm LA Vol (A2C):   38.7 ml 19.69 ml/m  RA Volume:   26.00 ml  13.23 ml/m LA Vol (A4C):   36.3 ml 18.47 ml/m LA Biplane Vol: 38.7 ml 19.69 ml/m  AORTIC VALVE LVOT Vmax:   93.80 cm/s LVOT Vmean:  64.500 cm/s LVOT VTI:    0.210 m  AORTA Ao Root diam: 2.52 cm Ao Asc diam:  3.02 cm MITRAL VALVE MV Area (PHT): 3.42 cm     SHUNTS MV Decel Time: 222 msec     Systemic VTI:  0.21 m MV E velocity: 61.30 cm/s   Systemic Diam: 2.12 cm MV A velocity: 115.00 cm/s MV E/A  ratio:  0.53 Toribio Fuel MD Electronically signed by Toribio Fuel MD Signature Date/Time: 04/21/2024/10:13:45 AM    Final    CT Angio Head Neck W WO CM Result Date: 04/20/2024 CLINICAL DATA:  Vision loss, monocular EXAM: CT ANGIOGRAPHY HEAD AND NECK WITH AND WITHOUT CONTRAST TECHNIQUE: Multidetector  CT imaging of the head and neck was performed using the standard protocol during bolus administration of intravenous contrast. Multiplanar CT image reconstructions and MIPs were obtained to evaluate the vascular anatomy. Carotid stenosis measurements (when applicable) are obtained utilizing NASCET criteria, using the distal internal carotid diameter as the denominator. RADIATION DOSE REDUCTION: This exam was performed according to the departmental dose-optimization program which includes automated exposure control, adjustment of the mA and/or kV according to patient size and/or use of iterative reconstruction technique. CONTRAST:  75mL OMNIPAQUE  IOHEXOL  350 MG/ML SOLN COMPARISON:  None Available. FINDINGS: CT HEAD FINDINGS Brain: Remote right occipital infarct. No evidence of acute large vascular territory infarct, acute hemorrhage, mass lesion or midline shift. Vascular: See below. Skull: No acute fracture. Sinuses/Orbits: Clear sinuses.  No acute orbital findings. Other: No mastoid effusions. Review of the MIP images confirms the above findings CTA NECK FINDINGS Aortic arch: Great vessel origins are patent without proximal hemodynamically significant stenosis. Right carotid system: No evidence of dissection, stenosis (50% or greater), or occlusion. Left carotid system: No evidence of dissection, stenosis (50% or greater), or occlusion. Vertebral arteries: Codominant. No evidence of dissection, stenosis (50% or greater), or occlusion. Skeleton: No evidence of acute fracture. Other neck: No evidence of acute abnormality. Upper chest: Visualized lung apices are clear. Review of the MIP images confirms the above  findings CTA HEAD FINDINGS Anterior circulation: Bilateral intracranial ICAs, MCAs, and ACAs are patent without proximal hemodynamically significant stenosis. No aneurysm identified. Posterior circulation: Bilateral intradural vertebral arteries, basilar artery and bilateral posterior cerebral arteries are patent without proximal hemodynamically significant stenosis. No aneurysm identified. Venous sinuses: As permitted by contrast timing, patent. Review of the MIP images confirms the above findings IMPRESSION: 1. No large vessel occlusion or proximal hemodynamically significant stenosis. 2. Remote right occipital infarct. Electronically Signed   By: Gilmore GORMAN Molt M.D.   On: 04/20/2024 19:23   US  LIMITED JOINT SPACE STRUCTURES UP RIGHT(NO LINKED CHARGES) Result Date: 03/11/2024 Procedure: Real-time Ultrasound Guided Injection of right glenohumeral joint Device: GE Logiq Q7 Ultrasound guided injection is preferred based studies that show increased duration, increased effect, greater accuracy, decreased procedural pain, increased response rate with ultrasound guided versus blind injection. Verbal informed consent obtained. Time-out conducted. Noted no overlying erythema, induration, or other signs of local infection. Skin prepped in a sterile fashion. Local anesthesia: Topical Ethyl chloride. With sterile technique and under real time ultrasound guidance:  Joint visualized.  23g 1  inch needle inserted anterior lateral approach. Pictures taken for needle placement. Patient did have injection of 2 cc of 1% lidocaine , 1 cc of Marcaine, a then once properly placed injected 5 cc of PRP that was in the supraspinatus, subscapularis and in the attachment of the bicep and anterior labrum. Completed without difficulty Pain immediately resolved suggesting accurate placement of the medication. Advised to call if fevers/chills, erythema, induration, drainage, or persistent bleeding. Images saved Impression: Technically  successful ultrasound guided injection.         ASSESSMENT & PLAN   Assessment & Plan TIA (transient ischemic attack) History of CVA with residual deficit Cerebrovascular accident with residual visual deficit  and follow up TIA following despite all risk factors controlled.  Focused on add Ozempic , avocado, Extra Virgin Olive Oil to his regimen.  He experienced a cerebrovascular accident in July, resulting in a residual visual deficit in the left eye. No new neurological symptoms have appeared since the ER visit for TIA on the 26th. Brain fog predates the stroke. Monitoring  shows no atrial fibrillation. There is a family history of cerebrovascular disease. Current medications include aspirin , Lipitor , Zetia , Imdur , and Brilinta  for cardiovascular protection. Discussed Ozempic 's potential benefits in reducing stroke risk and managing blood sugar levels, with a 15% average weight loss in the first year, potentially reducing stroke risk. Continue current medications. Recommend purchasing an Apple Watch for heart rhythm monitoring and fall detection. Encourage dietary intake of extra virgin olive oil and avocado. Prescribe Ozempic  for stroke prevention and weight loss.  Transient ischemic attack, resolved   The resolved TIA has shown no new symptoms since the ER visit. Possible causes include poor blood flow, bleeding, or revascularization. The current treatment plan aims to prevent further TIAs and improve vascular health. Discussed Ozempic 's potential to reduce the risk of further TIAs, especially in small vessel disease. Continue current medications for cardiovascular protection. Prescribe Ozempic  to reduce the risk of further TIAs.  Prediabetes   Lipitor  may contribute to elevated blood sugar levels. Discussed Ozempic 's potential benefits in reducing stroke risk and managing blood sugar levels, with a 15% average weight loss in the first year, potentially reducing stroke risk. Prescribe Ozempic  to  aid in weight loss and improve blood sugar control.  Hyperlipidemia   Hyperlipidemia is managed with atorvastatin  and Zetia . Recent discontinuation of atorvastatin  led to elevated cholesterol levels, but its use has resumed. Emphasized maintaining low LDL levels to prevent cardiovascular events. Encourage dietary modifications to support cholesterol management. Explained that maintaining LDL under 40 may dissolve arterial plaques, potentially preventing vascular disease progression. Continue atorvastatin  and Zetia . Encourage dietary intake of extra virgin olive oil and avocado. Consider Repatha if statin intolerance develops, though not currently indicated due to good cholesterol control without it.  Elevated bilirubin   Chronic mildly elevated bilirubin is possibly related to increased red blood cell breakdown due to vascular issues. No evidence of liver disease or significant liver function impairment. Discussed the benign nature of elevated bilirubin in the context of his cardiovascular risk profile.  General Health Maintenance   Discussed dietary modifications to support cardiovascular health, including the use of extra virgin olive oil and avocado. Encourage moderate exercise with attention to heart rate limits. Explained that moderate exercise and dietary changes can improve vascular health and reduce cardiovascular risk. Encourage moderate exercise with heart rate under 150 bpm. Recommend TRX exercises for resistance training. Advise on dietary intake of extra virgin olive oil and avocado.  Follow-Up   Follow-up with Doctor Katrinka is planned. No immediate need for repeat labs. Discussed the importance of monitoring for new symptoms and maintaining the current treatment plan. Follow up with Doctor Katrinka in approximately 6 weeks. No repeat labs needed for 3-6 months unless new symptoms arise. Breast pain Musculoskeletal chest pain: Intermittent musculoskeletal chest pain is likely related to  atorvastatin  use. Pain is localized to the left upper chest, possibly involving the pectoralis muscle. Explained that atorvastatin  may cause muscle cramping, but benefits outweigh the risks and side effect(s). Would not be safe to trial stop statin at this time, given recent transient ishemic attack (TIA).  Continue atorvastatin  despite potential musculoskeletal side effects due to cardiovascular benefits.   ORDER ASSOCIATIONS  #   DIAGNOSIS / CONDITION ICD-10 ENCOUNTER ORDER     ICD-10-CM   1. TIA (transient ischemic attack)  G45.9 Semaglutide ,0.25 or 0.5MG /DOS, (OZEMPIC , 0.25 OR 0.5 MG/DOSE,) 2 MG/3ML SOPN    2. Breast pain  N64.4     3. History of CVA with residual deficit  I69.30 Semaglutide ,0.25 or 0.5MG /DOS, (  OZEMPIC , 0.25 OR 0.5 MG/DOSE,) 2 MG/3ML SOPN          Orders Placed in Encounter:   Meds ordered this encounter  Medications   Semaglutide ,0.25 or 0.5MG /DOS, (OZEMPIC , 0.25 OR 0.5 MG/DOSE,) 2 MG/3ML SOPN    Sig: Inject 0.25 mg into the skin once a week.    Dispense:  3 mL    Refill:  2      This document was synthesized by artificial intelligence (Abridge) using HIPAA-compliant recording of the clinical interaction;   We discussed the use of AI scribe software for clinical note transcription with the patient, who gave verbal consent to proceed. additional Info: This encounter employed state-of-the-art, real-time, collaborative documentation. The patient actively reviewed and assisted in updating their electronic medical record on a shared screen, ensuring transparency and facilitating joint problem-solving for the problem list, overview, and plan. This approach promotes accurate, informed care. The treatment plan was discussed and reviewed in detail, including medication safety, potential side effects, and all patient questions. We confirmed understanding and comfort with the plan. Follow-up instructions were established, including contacting the office for any concerns, returning  if symptoms worsen, persist, or new symptoms develop, and precautions for potential emergency department visits.  I personally spent a total of 36 minutes in the care of the patient today including preparing to see the patient, performing a medically appropriate exam/evaluation, counseling and educating, placing orders, referring and communicating with other health care professionals, documenting clinical information in the EHR, independently interpreting results, communicating results, and coordinating care.

## 2024-05-20 NOTE — Patient Instructions (Addendum)
 It was a pleasure seeing you today! Your health and satisfaction are our top priorities.  Bernardino Cone, MD  VISIT SUMMARY: You came in today for a follow-up regarding your symptoms and medication management after your stroke. We discussed your brain fog, chest soreness, and current medications. We also reviewed your family history of vascular issues and your recent stroke and TIA. Your current medications and lifestyle choices were evaluated, and new recommendations were made to help manage your conditions and reduce future risks.  YOUR PLAN: -CEREBROVASCULAR ACCIDENT WITH RESIDUAL VISUAL DEFICIT: A cerebrovascular accident, or stroke, occurs when blood flow to a part of the brain is interrupted, causing brain cells to die. You have a residual visual deficit in your left eye from your stroke in July. We will continue your current medications (aspirin , Lipitor , Zetia , Imdur , and Brilinta ) to protect your cardiovascular health. We discussed the potential benefits of Ozempic  for reducing stroke risk and managing blood sugar levels, and I have prescribed it for you. Please continue using your Apple Watch for heart rhythm monitoring and fall detection, and maintain your diet with extra virgin olive oil and avocado.  -TRANSIENT ISCHEMIC ATTACK, RESOLVED: A transient ischemic attack (TIA) is a temporary period of symptoms similar to those of a stroke. Your TIA has resolved with no new symptoms since your ER visit. We will continue your current medications to protect your cardiovascular health and have prescribed Ozempic  to reduce the risk of further TIAs.  -PREDIABETES: Prediabetes means your blood sugar levels are higher than normal but not high enough to be classified as diabetes. Lipitor  may contribute to elevated blood sugar levels. We discussed the benefits of Ozempic  for managing blood sugar levels and reducing stroke risk, and I have prescribed it to aid in weight loss and improve blood sugar  control.  -HYPERLIPIDEMIA: Hyperlipidemia is having high levels of fats (lipids) in your blood, which can increase your risk of cardiovascular disease. Your condition is managed with atorvastatin  and Zetia . It is important to maintain low LDL levels to prevent cardiovascular events. Please continue your current medications and dietary modifications, including the intake of extra virgin olive oil and avocado. If you develop intolerance to statins, we may consider Repatha in the future.  -ELEVATED BILIRUBIN: Elevated bilirubin can be due to increased breakdown of red blood cells and is often benign if there is no liver disease. Your mildly elevated bilirubin is likely related to vascular issues and does not indicate liver disease. We will continue to monitor it as part of your cardiovascular risk profile.  -MUSCULOSKELETAL CHEST PAIN: Musculoskeletal chest pain is pain originating from the muscles or bones in the chest. Your intermittent chest pain is likely related to atorvastatin  use. While atorvastatin  may cause muscle cramping, its benefits for your cardiovascular health outweigh the risks. Please continue taking atorvastatin .  -GENERAL HEALTH MA INTENANCE: To support your cardiovascular health, continue with dietary modifications including the use of extra virgin olive oil and avocado. Engage in moderate exercise, keeping your heart rate under 150 bpm, and consider TRX exercises for resistance training. These lifestyle changes can improve your vascular health and reduce cardiovascular risk.  INSTRUCTIONS: Please follow up with Doctor Katrinka in approximately 6 weeks. No repeat labs are needed for 3-6 months unless new symptoms arise. Continue monitoring for any new symptoms and maintain your current treatment plan.  Your Providers PCP: [name], MD,  [phone]) Referring Provider: [name], MD,  [phone]) Care Team Provider: Lavona Agent, MD,  [phone])  Care  Team Provider: Dow Maxwell, PT Care Team Provider: Livingston Rigg, MD,  [phone])  NEXT STEPS: [x]  Early Intervention: Schedule sooner appointment, call our on-call services, or go to emergency room if there is any significant Increase in pain or discomfort New or worsening symptoms Sudden or severe changes in your health [x]  Flexible Follow-Up: We recommend a Return in about 1 month (around 06/20/2024). for optimal routine care. This allows for progress monitoring and treatment adjustments. [x]  Preventive Care: Schedule your annual preventive care visit! It's typically covered by insurance and helps identify potential health issues early. [x]  Lab & X-ray Appointments: Incomplete tests scheduled today, or call to schedule. X-rays: [facility] (M-F, 8:30am-noon or 1pm-5pm). [x]  Medical Information Release: Sign a release form at front desk to obtain relevant medical information we don't have.  MAKING THE MOST OF OUR FOCUSED 20 MINUTE APPOINTMENTS: [x]   Clearly state your top concerns at the beginning of the visit to focus our discussion [x]   If you anticipate you will need more time, please inform the front desk during scheduling - we can book multiple appointments in the same week. [x]   If you have transportation problems- use our convenient video appointments or ask about transportation support. [x]   We can get down to business faster if you use MyChart to update information before the visit and submit non-urgent questions before your visit. Thank you for taking the time to provide details through MyChart.  Let our nurse know and she can import this information into your encounter documents.  Arrival and Wait Times: [x]   Arriving on time ensures that everyone receives prompt attention. [x]   Early morning (8a) and afternoon (1p) appointments tend to have shortest wait times. [x]   Unfortunately, we cannot delay appointments for late arrivals or hold slots during phone  calls.  Getting Answers and Following Up [x]   Simple Questions & Concerns: For quick questions or basic follow-up after your visit, reach us  at [phone] or MyChart messaging. [x]   Complex Concerns: If your concern is more complex, scheduling an appointment might be best. Discuss this with the staff to find the most suitable option. [x]   Lab & Imaging Results: We'll contact you directly if results are abnormal or you don't use MyChart. Most normal results will be on MyChart within 2-3 business days, with a review message from Dr. Jesus. Haven't heard back in 2 weeks? Need results sooner? Contact us  at [phone]. [x]   Referrals: Our referral coordinator will manage specialist referrals. The specialist's office should contact you within 2 weeks to schedule an appointment. Call us  if you haven't heard from them after 2 weeks.  Staying Connected [x]   MyChart: Activate your MyChart for the fastest way to access results and message us . See the last page of this paperwork for instructions on how to activate.  Bring to Your Next Appointment [x]   Medications: Please bring all your medication bottles to your next appointment to ensure we have an accurate record of your prescriptions. [x]   Health Diaries: If you're monitoring any health conditions at home, keeping a diary of your readings can be very helpful for discussions at your next appointment.  Billing [x]   X-ray & Lab Orders: These are billed by separate companies. Contact the invoicing company directly for questions or concerns. [x]   Visit Charges: Discuss any billing inquiries with our administrative services team.  Your Satisfaction Matters [x]   Share Your Experience: We strive for your satisfaction! If you have any complaints, or preferably  compliments, please let Dr. Jesus know directly or contact our Practice Administrators, Manuelita Rubin or [employer], by asking at the front desk.   Reviewing Your Records [x]   Review  this early draft of your clinical encounter notes below and the final encounter summary tomorrow on MyChart after its been completed.  All orders placed so far are visible here: TIA (transient ischemic attack) -     Ozempic  (0.25 or 0.5 MG/DOSE); Inject 0.25 mg into the skin once a week.  Dispense: 3 mL; Refill: 2  Breast pain  History of CVA with residual deficit -     Ozempic  (0.25 or 0.5 MG/DOSE); Inject 0.25 mg into the skin once a week.  Dispense: 3 mL; Refill: 2

## 2024-05-23 MED ORDER — OZEMPIC (0.25 OR 0.5 MG/DOSE) 2 MG/3ML ~~LOC~~ SOPN: 0.2500 mg | PEN_INJECTOR | SUBCUTANEOUS | 2 refills | Status: DC

## 2024-05-24 ENCOUNTER — Telehealth: Payer: Self-pay

## 2024-05-24 ENCOUNTER — Other Ambulatory Visit (HOSPITAL_COMMUNITY): Payer: Self-pay

## 2024-05-24 NOTE — Telephone Encounter (Signed)
 Left voice message to call back 9/2

## 2024-05-24 NOTE — Telephone Encounter (Signed)
 Pharmacy Patient Advocate Encounter   Received notification from Physician's Office that prior authorization for Ozempic  (0.25 or 0.5 MG/DOSE) 2MG /3ML pen-injectors is required/requested.   Insurance verification completed.   The patient is insured through [employer] .   Per test claim: PA required; PA submitted to above mentioned insurance via Latent Key/confirmation #/EOC [id number] Status is pending

## 2024-05-25 NOTE — Telephone Encounter (Signed)
 Pharmacy Patient Advocate Encounter  Received notification from [facility] that Prior Authorization for Ozempic  (0.25 or 0.5 MG/DOSE) 2MG /3ML pen-injectors  has been DENIED.  Full denial letter will be uploaded to the media tab. See denial reason below.      PA #/Case ID/Reference #: [id number]  - We can try [name]

## 2024-05-26 ENCOUNTER — Other Ambulatory Visit: Payer: Self-pay

## 2024-05-26 ENCOUNTER — Other Ambulatory Visit (HOSPITAL_COMMUNITY): Payer: Self-pay

## 2024-05-26 ENCOUNTER — Telehealth: Payer: Self-pay

## 2024-05-26 MED ORDER — TRULICITY 0.75 MG/0.5ML ~~LOC~~ SOAJ: 0.7500 mg | SUBCUTANEOUS | 1 refills | Status: DC

## 2024-05-26 NOTE — Telephone Encounter (Signed)
Tried to call pt no answer left message to call office back

## 2024-05-26 NOTE — Telephone Encounter (Signed)
 Pharmacy Patient Advocate Encounter   Received notification from Physician's Office that prior authorization for Trulicity  0.75MG /0.5ML auto-injectors is required/requested.   Insurance verification completed.   The patient is insured through [employer] .   Per test claim: PA required; PA submitted to above mentioned insurance via Latent Key/confirmation #/EOC [facility] Status is pending

## 2024-05-26 NOTE — Telephone Encounter (Signed)
 [facility] in trulicity  in to [facility] preferred to try before any other. Per Dr Jesus sent it in. Will notified pt

## 2024-05-27 NOTE — Patient Instructions (Addendum)
 Below is our plan:  Stroke: new additional punctate infarct adjacent to previous R PCA small infarct, likely secondary to BP fluctuation and mild orthostatic hypotension: Residual deficit: left visual field deficit. Continue aspirin  81 mg daily and Brilinta  (ticagrelor ) 90 mg bid and atorvastatin  80mg  daily for secondary stroke prevention. Discussed secondary stroke prevention measures and importance of close PCP follow up for aggressive stroke risk factor management. I have gone over the pathophysiology of stroke, warning signs and symptoms, risk factors and their management in some detail with instructions to go to the closest emergency room for symptoms of concern. Recurrent CVA: 04/21/2024 admitted for small right PCA infarct. CT head and neck unremarkable. EF 55 to 60%. LDL 121, A1c 5.4. Patient refused loop recorder and Librexia trial, discharged on aspirin  and Brilinta  and Lipitor  80 and Zetia  as well as 30-day CardioNet monitoring  HTN: BP goal <130/90. Currently well managed off HTN agents. Continue to monitor per PCP HLD: LDL goal <70. Recent LDL 121, last LDL 42 after starting atorvastatin  80mg .and Zetia  10mg  daily. Continue per PCP.SABRA  DMII: A1c goal<7.0. Recent A1c 5.4. Not diabetic. Continue to monitor per PCP.  CAD: s/p stenting 2017. On DAPT and Imdur . Schedule follow up with cardiology.  Obesity: Consider mediterranean style diet. Continue regular exercise. Continue discussion regarding Trulicity /Ozempic  for weight management per PCP.   Goals:  1) Maintain strict control of hypertension with blood pressure goal below 130/90 2) Maintain good control of diabetes with hemoglobin A1c goal below 7%  3) Maintain good control of lipids with LDL cholesterol goal below 70 mg/dL.  4) Eat a healthy diet with plenty of whole grains, cereals, fruits and vegetables, exercise regularly and maintain ideal body weight    Resources: [email or web address].biz/  Please make sure you are staying well hydrated. I recommend 50-60 ounces daily. Well balanced diet and regular exercise encouraged. Consistent sleep schedule with 6-8 hours recommended.   Please continue follow up with care team as directed.   Follow up with me in 6-8 months   You may receive a survey regarding today's visit. I encourage you to leave honest feed back as I do use this information to improve patient care. Thank you for seeing me today!

## 2024-05-27 NOTE — Telephone Encounter (Signed)
 Pharmacy Patient Advocate Encounter  Received notification from [facility] that Prior Authorization for Trulicity  0.75MG /0.5ML auto-injectors  has been DENIED.  Full denial letter will be uploaded to the media tab. See denial reason below.    PA #/Case ID/Reference #: [id number]

## 2024-05-27 NOTE — Telephone Encounter (Signed)
 Spoke with pt regarding Brilinta . Pt stated he knows now that he is supposed to be taking the medication. Pt stated he does not need a refill. Pt verbalized understanding. All questions if any were answered.

## 2024-05-27 NOTE — Progress Notes (Signed)
 [facility] [address] [place]. [place] [address] [phone]       HOSPITAL FOLLOW UP NOTE  Mr. Michael [name] Date of Birth: 04/04/1956 Medical Record Number: [id number]   Reason for Referral:  hospital stroke follow up    SUBJECTIVE:   CHIEF COMPLAINT:  Chief Complaint  Patient presents with   RM2/Stroke    Pt is here Alone. Pt states that things have been good since his stroke. Pt states that yesterday his left knee was a little weak.     HPI:   Michael [name] is a 68 y.o. who  has a past medical history of Acute lateral meniscal tear, right, initial encounter (09/26/2021), CAD S/P PCI to Cx & RCA, HTN (hypertension), Hyperlipidemia, Myocardial infarction (HCC) (05/29/2011), and Plantar fasciitis (10/12/2017).  Patient presented on 04/20/2024 with acute onset transient unilateral left eye vision loss. Workup revealed acute right occipital lobe infarct. Concerns raised for atrial fib. ILR declined. 30 day monitor placed. LDL 121. A1C 5.4. Asa and Brilinta  continued as he was previously taking s/p cardiac stenting. He returned to the ER 8/6 due to left visual field and speech deficits, along with confusion and left-sided numbness. CTA unremarkable. MRI showed new punctuated cortical infarct on the right occipital lobe. DAPT continued. LDL 42. Atorvastatin  continued and Zetia  added. No therapy advised. Personally reviewed hospitalization pertinent progress notes, lab work and imaging.  Evaluated by Dr Jerri.   Since discharge, he reports doing well. Nearly back to baseline. He was seen by ophthalmology. Reports 25% peripheral vision field loss in left lower quadrant. He denies any significant difficulty with vision.   He was seen by PCP, recently. Considering Ozempic /Trulicity  for weight management and hx prediabetes. He continues atorvastatin  and Zetia . Tolerating meds. BP well managed. He was last seen by cardiology 11/2023. He continues asa and  Brilinta . No obvious adverse effects.   He has resumed driving following ophthalmology evaluation. He lives with his wife and two adult children. He is able to complete ADLs independently. Manges home and finances. He does have some occasional word finding difficulty. Present prior to stroke as well but may be slightly worse since stroke. He goes to the gym 2-3 days a week. He likes to swim. He enjoys reading.    PERTINENT IMAGING/LABS  CT no acute abnormality MRI showed bulging recent right PCA infarct with additional adjacent punctate infarct CTA showed no LVO, remote right occipital infarct TEE unremarkable, no thrombus or PFO 30-day cardiac event monitoring    A1C Lab Results  Component Value Date   HGBA1C 5.4 04/21/2024    Lipid Panel     Component Value Date/Time   CHOL 93 05/11/2024 0907   TRIG 70.0 05/11/2024 0907   HDL 47.20 05/11/2024 0907   CHOLHDL 2 05/11/2024 0907   VLDL 14.0 05/11/2024 0907   LDLCALC 31 05/11/2024 0907   LDLCALC 54 08/03/2020 1448   LDLDIRECT 62.0 09/14/2019 0851      ROS:   14 system review of systems performed and negative with exception of those listed in HPI  PMH:  Past Medical History:  Diagnosis Date   Acute lateral meniscal tear, right, initial encounter 09/26/2021   Injection given September 26, 2018.   CAD S/P PCI to Cx & RCA    a. s/p PCI of LCx (2006) and ostium of RCA (2007) as well as DES to RCA in [place] (05/2014)   HTN (hypertension)    Hyperlipidemia    Myocardial infarction (HCC)  05/29/2011   Plantar fasciitis 10/12/2017    PSH:  Past Surgical History:  Procedure Laterality Date   CARDIAC CATHETERIZATION N/A 05/06/2016   Procedure: Left Heart Cath and Coronary Angiography;  Surgeon: Lonni Hanson, MD;  Location: [facility] INVASIVE CV LAB;  Service: Cardiovascular;  Laterality: N/A;   CARDIAC CATHETERIZATION N/A 05/06/2016   Procedure: Coronary Stent Intervention;  Surgeon: Lonni Hanson, MD;  Location: [facility] INVASIVE CV  LAB;  Service: Cardiovascular;  Laterality: N/A;   CARDIAC CATHETERIZATION N/A 05/13/2016   Procedure: Coronary/Graft Angiography;  Surgeon: Peter M Swaziland, MD;  Location: [facility] INVASIVE CV LAB;  Service: Cardiovascular;  Laterality: N/A;   CORONARY ANGIOPLASTY WITH STENT PLACEMENT  09/22/2004   in [place] , occluded CFX, s/p Taxus stent   CORONARY ANGIOPLASTY WITH STENT PLACEMENT  09/22/2005   in [place] , ostial RCA stent   CORONARY ANGIOPLASTY WITH STENT PLACEMENT  09/22/2013   in [place] , RCA stent   CORONARY PRESSURE/FFR STUDY N/A 10/02/2023   Procedure: CORONARY PRESSURE/FFR STUDY;  Surgeon: Wendel Lurena POUR, MD;  Location: [facility] INVASIVE CV LAB;  Service: Cardiovascular;  Laterality: N/A;   LEFT HEART CATH AND CORONARY ANGIOGRAPHY N/A 10/02/2023   Procedure: LEFT HEART CATH AND CORONARY ANGIOGRAPHY;  Surgeon: Wendel Lurena POUR, MD;  Location: [facility] INVASIVE CV LAB;  Service: Cardiovascular;  Laterality: N/A;   TONSILLECTOMY AND ADENOIDECTOMY      TRANSESOPHAGEAL ECHOCARDIOGRAM (CATH LAB) N/A 04/29/2024   Procedure: TRANSESOPHAGEAL ECHOCARDIOGRAM;  Surgeon: Delford Maude BROCKS, MD;  Location: [facility] INVASIVE CV LAB;  Service: Cardiovascular;  Laterality: N/A;    Social History:  Social History   Socioeconomic History   Marital status: Married    Spouse name: Not on file   Number of children: 4   Years of education: Not on file   Highest education level: Not on file  Occupational History   Occupation: retired  Tobacco Use   Smoking status: Never   Smokeless tobacco: Never  Vaping Use   Vaping status: Never Used  Substance and Sexual Activity   Alcohol use: No    Alcohol/week: 0.0 standard drinks of alcohol   Drug use: No   Sexual activity: Yes  Other Topics Concern   Not on file  Social History Narrative   Lives with wife.  4 kids total (2 step, 2 biological). 1 that has CP. Daughter Elvie Hsu- comes to [facility]).        Retired Water quality scientist.  Oldest daughter has  CP      Hobbies: gardening, lawn care, care for grandkids   Social Drivers of Health   Financial Resource Strain: Low Risk  (04/04/2024)   Overall Financial Resource Strain (CARDIA)    Difficulty of Paying Living Expenses: Not hard at all  Food Insecurity: No Food Insecurity (05/02/2024)   Hunger Vital Sign    Worried About Running Out of Food in the Last Year: Never true    Ran Out of Food in the Last Year: Never true  Transportation Needs: No Transportation Needs (05/02/2024)   PRAPARE - Administrator, Civil Service (Medical): No    Lack of Transportation (Non-Medical): No  Physical Activity: Sufficiently Active (04/04/2024)   Exercise Vital Sign    Days of Exercise per Week: 2 days    Minutes of Exercise per Session: 90 min  Stress: No Stress Concern Present (04/04/2024)   [employer] of Occupational Health - Occupational Stress Questionnaire    Feeling of Stress: Not at all  Social Connections: Moderately Integrated (  04/28/2024)   Social Connection and Isolation Panel    Frequency of Communication with Friends and Family: More than three times a week    Frequency of Social Gatherings with Friends and Family: More than three times a week    Attends Religious Services: More than 4 times per year    Active Member of [employer] or Organizations: No    Attends Banker Meetings: Never    Marital Status: Married  Catering manager Violence: Not At Risk (05/02/2024)   Humiliation, Afraid, Rape, and Kick questionnaire    Fear of Current or Ex-Partner: No    Emotionally Abused: No    Physically Abused: No    Sexually Abused: No    Family History:  Family History  Problem Relation Age of Onset   Hypertension Father    Dementia Father        Vascular. patient states dementia/possible alzheimers as well   Hyperlipidemia Sister    Cancer Brother        type unknown   Cancer Brother        type unknown   Heart disease Maternal Grandfather 48       Died  probably of heart diseasse   Heart disease Maternal Uncle 58       Died of heart exploding   Cerebral palsy Daughter    Stroke Son        severe problems with blood clotting   Colon cancer Neg Hx    Esophageal cancer Neg Hx    Pancreatic cancer Neg Hx    Stomach cancer Neg Hx    Liver disease Neg Hx     Medications:   Current Outpatient Medications on File Prior to Visit  Medication Sig Dispense Refill   aspirin  81 MG tablet Take 81 mg by mouth daily.     atorvastatin  (LIPITOR ) 80 MG tablet TAKE 1 TABLET DAILY 90 tablet 3   Coenzyme Q10 (Q-SORB CO Q-10) 100 MG capsule Take 1 capsule by mouth daily.     Cyanocobalamin  (VITAMIN B-12 PO) Take 1 tablet by mouth every other day.     Dulaglutide  (TRULICITY ) 0.75 MG/0.5ML SOAJ Inject 0.75 mg into the skin once a week. 1 mL 1   ezetimibe  (ZETIA ) 10 MG tablet Take 1 tablet (10 mg total) by mouth daily. 30 tablet 11   famotidine  (PEPCID ) 40 MG tablet Take 1 tablet (40 mg total) by mouth daily. (Patient taking differently: Take 40 mg by mouth daily as needed for heartburn or indigestion.) 90 tablet 3   isosorbide  mononitrate (IMDUR ) 60 MG 24 hr tablet TAKE 1 TABLET DAILY 90 tablet 3   ranolazine  (RANEXA ) 500 MG 12 hr tablet Take 1 tablet (500 mg total) by mouth 2 (two) times daily. 60 tablet 5   ticagrelor  (BRILINTA ) 60 MG TABS tablet Take 1 tablet (60 mg total) by mouth 2 (two) times daily. 180 tablet 3   No current facility-administered medications on file prior to visit.    Allergies:   Allergies  Allergen Reactions   Prednisone  Itching      OBJECTIVE:  Physical Exam  Vitals:   05/30/24 0815  Weight: 195 lb (88.5 kg)  Height: 5' 6 (1.676 m)   Body mass index is 31.47 kg/m. No results found.     05/02/2024   11:04 AM  Depression screen PHQ 2/9  Decreased Interest 0  Down, Depressed, Hopeless 1  PHQ - 2 Score 1  Altered sleeping 0  Tired, decreased energy 0  Change in appetite 0  Feeling bad or failure about  yourself  0  Trouble concentrating 0  Moving slowly or fidgety/restless 0  Suicidal thoughts 0  PHQ-9 Score 1  Difficult doing work/chores Not difficult at all     General: well developed, well nourished, seated, in no evident distress Head: head normocephalic and atraumatic.   Neck: supple with no carotid or supraclavicular bruits Cardiovascular: regular rate and rhythm, no murmurs Musculoskeletal: no deformity Skin:  no rash/petichiae Vascular:  Normal pulses all extremities   Neurologic Exam Mental Status: Awake and fully alert.  Fluent speech and language.  Oriented to place and time. Recent and remote memory intact. Attention span, concentration and fund of knowledge appropriate. Mood and affect appropriate.  Cranial Nerves: Fundoscopic exam reveals sharp disc margins. Pupils equal, briskly reactive to light. Extraocular movements full without nystagmus. Visual fields full to confrontation with exception of left lower field. Hearing intact. Facial sensation intact. Face, tongue, palate moves normally and symmetrically.  Motor: Normal bulk and tone. Normal strength in all tested extremity muscles Sensory.: intact to touch , pinprick , position and vibratory sensation.  Coordination: Rapid alternating movements normal in all extremities. Finger-to-nose and heel-to-shin performed accurately bilaterally. Gait and Station: Arises from chair without difficulty. Stance is normal. Gait demonstrates normal stride length and balance. Reflexes: 1+ and symmetric.    NIHSS  1 Modified Rankin  0    ASSESSMENT: Michael [name] is a 68 y.o. year old male with recurrent CVA. Vascular risk factors include HTN, HLD, CAD, obesity.      PLAN:  Stroke: new additional punctate infarct adjacent to previous R PCA small infarct, likely secondary to BP fluctuation and mild orthostatic hypotension: Residual deficit: left visual field deficit. Continue aspirin  81 mg daily and Brilinta   (ticagrelor ) 90 mg bid and atorvastatin  80mg  daily for secondary stroke prevention. Discussed secondary stroke prevention measures and importance of close PCP follow up for aggressive stroke risk factor management. I have gone over the pathophysiology of stroke, warning signs and symptoms, risk factors and their management in some detail with instructions to go to the closest emergency room for symptoms of concern. Recurrent CVA: 04/21/2024 admitted for small right PCA infarct. CT head and neck unremarkable. EF 55 to 60%. LDL 121, A1c 5.4. Patient refused loop recorder and Librexia trial, discharged on aspirin  and Brilinta  and Lipitor  80 and Zetia  as well as 30-day CardioNet monitoring  HTN: BP goal <130/90. Currently well managed off HTN agents. Continue to monitor per PCP HLD: LDL goal <70. Recent LDL 121, last LDL 42 after starting atorvastatin  80mg .and Zetia  10mg  daily. Continue per PCP.SABRA  DMII: A1c goal<7.0. Recent A1c 5.4. Not diabetic. Continue to monitor per PCP.  CAD: s/p stenting 2017. On DAPT and Imdur . Schedule follow up with cardiology.  Obesity: Consider mediterranean style diet. Continue regular exercise. Continue discussion regarding Trulicity /Ozempic  for weight management per PCP.    Follow up in 6-8 months or call earlier if needed   CC:  [facility] provider: Dr. Rosemarie PCP: [name], MD    I spent 45 minutes of face-to-face and non-face-to-face time with patient.  This included previsit chart review including review of recent hospitalization, lab review, study review, order entry, electronic health record documentation, patient education regarding recent stroke including etiology, secondary stroke prevention measures and importance of managing stroke risk factors, residual deficits and typical recovery time and answered all other questions to patient satisfaction   Greig Forbes, FNP-C  [facility]  [address] [place], [place]  [address]  Phone [phone] Fax [phone] Note: This document was prepared with digital dictation and possible smart phrase technology. Any transcriptional errors that result from this process are unintentional.

## 2024-05-30 ENCOUNTER — Encounter: Payer: Self-pay | Admitting: Family Medicine

## 2024-05-30 ENCOUNTER — Telehealth: Payer: Self-pay | Admitting: Family Medicine

## 2024-05-30 ENCOUNTER — Ambulatory Visit (INDEPENDENT_AMBULATORY_CARE_PROVIDER_SITE_OTHER): Admitting: Family Medicine

## 2024-05-30 VITALS — BP 132/71 | HR 86 | Ht 66.0 in | Wt 195.0 lb

## 2024-05-30 DIAGNOSIS — I639 Cerebral infarction, unspecified: Secondary | ICD-10-CM

## 2024-05-30 NOTE — Telephone Encounter (Signed)
 Patient wants to hold off on appt for lab work since just having it done on 8/20 and 8/26.

## 2024-05-30 NOTE — Telephone Encounter (Signed)
 read by Perri Jurist Orsini at 9:44AM on 05/30/2024.

## 2024-05-30 NOTE — Telephone Encounter (Signed)
 Can order CBC with differential and CMP under hypertension-was there anything else in particular that he wanted done?

## 2024-05-30 NOTE — Telephone Encounter (Signed)
 Please see message and advise is lab work is still needed 06/10/2024.   Copied from CRM #[id number]. Topic: Clinical - Request for Lab/Test Order >> May 30, 2024 10:51 AM Vena HERO wrote: Reason for CRM: Pt called in to see if Dr Katrinka is requiring him to do lab work on the 19th of this month due to having lab work recently after a hospital stay in August. Please call pt to verify at [phone]

## 2024-05-31 ENCOUNTER — Ambulatory Visit: Payer: Self-pay | Admitting: Family Medicine

## 2024-05-31 DIAGNOSIS — I639 Cerebral infarction, unspecified: Secondary | ICD-10-CM | POA: Diagnosis not present

## 2024-05-31 NOTE — Addendum Note (Signed)
 Encounter addended by: Malvina Pina A on: 05/31/2024 7:48 AM  Actions taken: Imaging Exam ended

## 2024-06-02 ENCOUNTER — Telehealth: Payer: Self-pay | Admitting: Cardiology

## 2024-06-02 NOTE — Telephone Encounter (Signed)
 Spoke with pt who is asking if he is OK to wait to be seen in follow up as previously instructed or if he would need to be seen sooner.  Recent hospitalization for CVA - normal zio (no At Fib).  Due for f/u 12/2024.  Advised will have MD to review and he will be notified of any change.

## 2024-06-02 NOTE — Telephone Encounter (Signed)
 Patient stated he recently wore a heart monitor and wants a call back to discuss results.  Patient noted he had a recent ED visit for possible stroke.

## 2024-06-03 ENCOUNTER — Telehealth: Payer: Self-pay

## 2024-06-03 ENCOUNTER — Other Ambulatory Visit: Payer: Self-pay

## 2024-06-03 DIAGNOSIS — G459 Transient cerebral ischemic attack, unspecified: Secondary | ICD-10-CM

## 2024-06-03 NOTE — Progress Notes (Signed)
 Ordered to assess for alcohol intoxication

## 2024-06-03 NOTE — Telephone Encounter (Signed)
 Spoke to patient about recent monitor results revealed no afib. He stated he does not want a loop recorder at this time.Post hospital appointment scheduled with  Daphne Barrack NP 9/16 at 8:50 am. at [facility] office.

## 2024-06-05 NOTE — Progress Notes (Deleted)
  Electrophysiology Office Note:   Date:  06/05/2024  ID:  Michael [name], DOB 10-25-1955, MRN [id number]  Primary Cardiologist: Lynwood Schilling, MD Primary Heart Failure: None Electrophysiologist: None  {Click to update primary MD,subspecialty MD or APP then REFRESH:1}    History of Present Illness:   Michael [name] is a 68 y.o. male with h/o cryptogenic stroke (R occipital CVA), HFpEF, CAD s/p PCI 2017, HTN, BPH seen today for post hospital follow up.    Admitted 8/6-04/29/24 in setting of cryptogenic stroke.  The patient was offered a Loop Recorder but he declined. He wore a 30d monitor which did not reveal any atrial fibrillation.   Since discharge from hospital the patient reports doing ***.   He denies chest pain, palpitations, dyspnea, PND, orthopnea, nausea, vomiting, dizziness, syncope, edema, weight gain, or early satiety.   Review of systems complete and found to be negative unless listed in HPI.   EP Information / Studies Reviewed:    EKG is not ordered today. EKG from 05/20/24 reviewed which showed NSR 86 bpm       Risk Assessment/Calculations:     No BP recorded.  {Refresh Note OR Click here to enter BP  :1}***        Physical Exam:   VS:  There were no vitals taken for this visit.   Wt Readings from Last 3 Encounters:  05/30/24 195 lb (88.5 kg)  05/20/24 193 lb 12.8 oz (87.9 kg)  05/17/24 195 lb (88.5 kg)     GEN: Well nourished, well developed in no acute distress NECK: No JVD; No carotid bruits CARDIAC: {EPRHYTHM:28826}, no murmurs, rubs, gallops RESPIRATORY:  Clear to auscultation without rales, wheezing or rhonchi  ABDOMEN: Soft, non-tender, non-distended EXTREMITIES:  No edema; No deformity   ASSESSMENT AND PLAN:    Cryptogenic Stroke  -30 day monitor with no evidence of AF  -reviewed risks / benefits of ILR for monitoring  -discussed implant / details of procedure & monitoring process -pt does not *** / elects to move forward  with monitoring ***  Follow up with Dr. Inocencio {EPFOLLOW LE:71826}  Signed, Daphne Barrack, NP-C, AGACNP-BC [facility] - Electrophysiology  06/05/2024, 8:11 PM

## 2024-06-06 NOTE — Telephone Encounter (Signed)
 Pt is scheduled with Daphne Barrack 06/07/24.

## 2024-06-06 NOTE — Progress Notes (Unsigned)
  Electrophysiology Office Note:   Date:  06/07/2024  ID:  Michael [name], DOB 1955-11-17, MRN [id number]  Primary Cardiologist: Lynwood Schilling, MD Electrophysiologist: None        History of Present Illness:   Michael [name] is a 68 y.o. male with h/o CAD, HTN, CVA, and BPH seen today for post hospital follow up.    Admitted 8/6- 8/8 with CVA Work up demonstrated:  CT head No acute abnormality.  Remote right occipital infarct CTA head & neck no LVO or hemodynamically significant stenosis MRI acute infarct in the inferior right occipital cortex, remote infarct in medial right occipital lobe, mild chronic microvascular ischemic changes 2D Echo EF 55 to 60%, grade 1 diastolic dysfunction, no interatrial shunt, normal left atrial size Loop recorder to be placed prior to discharge LDL 121 HgbA1c 5.4 VTE prophylaxis -Lovenox  aspirin  81 mg daily and Brilinta  (ticagrelor ) 90 mg bid prior to admission, now on aspirin  81 mg daily and Brilinta  (ticagrelor ) 90 mg bid, considering participation in [place] study  Declined Loop recorder. Wore monitor that showed NO AF. Initially continued to decline loop, but via phone agreed to proceed and presents today to discuss the same.   Since discharge from hospital the patient reports doing well. Overall, he denies exertional chest pain, dyspnea, PND, orthopnea, nausea, vomiting, dizziness, syncope, edema, weight gain, or early satiety.  Rare, solitary palpitations. Not sustained.   Review of systems complete and found to be negative unless listed in HPI.   EP Information / Studies Reviewed:    EKG is not ordered today. EKG from 05/17/2024 reviewed which showed NSR at 86 bpm       Arrhythmia/Device History No specialty comments available.   Physical Exam:   VS:  BP 114/68   Pulse 75   Ht 5' 6 (1.676 m)   Wt 191 lb 6.4 oz (86.8 kg)   SpO2 98%   BMI 30.89 kg/m    Wt Readings from Last 3 Encounters:  06/07/24 191 lb 6.4 oz  (86.8 kg)  05/30/24 195 lb (88.5 kg)  05/20/24 193 lb 12.8 oz (87.9 kg)     GEN: No acute distress NECK: No JVD; No carotid bruits CARDIAC: Regular rate and rhythm, no murmurs, rubs, gallops RESPIRATORY:  Clear to auscultation without rales, wheezing or rhonchi  ABDOMEN: Soft, non-tender, non-distended EXTREMITIES:  No edema; No deformity   ASSESSMENT AND PLAN:    Cryptogenic Stroke Pt wore monitor without AF I spoke at length with the patient about monitoring for afib with an implantable loop recorder, including monthly monitor fees which may range from $0-$40.  Risks, benefits, and alteratives to implantable loop recorder were discussed with the patient today. Pt verbalizes understanding and agrees to proceed today.    Follow up with Dr. Inocencio in person as needed. Continue remote monitoring.   Signed, Ozell Prentice Passey, PA-C

## 2024-06-07 ENCOUNTER — Encounter: Payer: Self-pay | Admitting: Student

## 2024-06-07 ENCOUNTER — Ambulatory Visit: Admitting: Pulmonary Disease

## 2024-06-07 ENCOUNTER — Ambulatory Visit: Attending: Student | Admitting: Student

## 2024-06-07 VITALS — BP 114/68 | HR 75 | Ht 66.0 in | Wt 191.4 lb

## 2024-06-07 DIAGNOSIS — I639 Cerebral infarction, unspecified: Secondary | ICD-10-CM | POA: Diagnosis not present

## 2024-06-07 NOTE — Patient Instructions (Addendum)
 Medication Instructions:  Your physician recommends that you continue on your current medications as directed. Please refer to the Current Medication list given to you today.  *If you need a refill on your cardiac medications before your next appointment, please call your pharmacy*  Lab Work: None ordered If you have labs (blood work) drawn today and your tests are completely normal, you will receive your results only by: MyChart Message (if you have MyChart) OR A paper copy in the mail If you have any lab test that is abnormal or we need to change your treatment, we will call you to review the results.  Follow-Up: At [facility], you and your health needs are our priority.  As part of our continuing mission to provide you with exceptional heart care, our providers are all part of one team.  This team includes your primary Cardiologist (physician) and Advanced Practice Providers or APPs (Physician Assistants and Nurse Practitioners) who all work together to provide you with the care you need, when you need it.  Your next appointment:   As needed   Care After Your Loop Recorder  You have a Medtronic Loop Recorder   Monitor your cardiac device site for redness, swelling, and drainage. Call the device clinic at [phone] if you experience these symptoms or fever/chills.  If you notice bleeding from your site, hold firm, but gently pressure with two fingers for 5 minutes. Dried blood on the steri-strips when removing the outer bandage is normal.   Keep the large square bandage on your site for 24 hours and then you may remove it yourself. Keep the steri-strips underneath in place.   You may shower after 72 hours / 3 days from your procedure with the steri-strips in place. They will usually fall off on their own, or may be removed after 10 days. Pat dry.   Avoid lotions, ointments, or perfumes over your incision until it is well-healed.  Please do not submerge in water until  your site is completely healed.   Your device is MRI compatible.   Remote monitoring is used to monitor your cardiac device from home. This monitoring is scheduled every month by our office. It allows us  to keep an eye on the function of your device to ensure it is working properly.

## 2024-06-07 NOTE — Progress Notes (Signed)
 SURGEON:  Ozell Prentice Passey, PA-C     PREPROCEDURE DIAGNOSIS:  Cryptogenic stroke    POSTPROCEDURE DIAGNOSIS: Cryptogenic stroke     PROCEDURES:   1. Implantable loop recorder implantation    INTRODUCTION:  Michael [name] presents with a history of cryptogenic stroke The costs of loop recorder monitoring have been discussed with the patient.    DESCRIPTION OF PROCEDURE:  Informed written consent was obtained.   Time Out Completed with Artist Pouch, PA-C    The patient required no sedation for the procedure today.  Mapping over the patient's chest was performed to identify the area where electrograms were most prominent for ILR recording.  This area was found to be the left parasternal region over the 4th intercostal space. The patients left chest was therefore prepped and draped in the usual sterile fashion. The skin overlying the left parasternal region was infiltrated with lidocaine  for local analgesia.  A 0.5-cm incision was made over the left parasternal region over the 3rd intercostal space.  A subcutaneous ILR pocket was fashioned using a combination of sharp and blunt dissection.  A Medtronic Reveal LINQ 2 implantable loop recorder (serial # [id number]) was then placed into the pocket  R waves were very prominent and measuring >0.40mV.  Steri- Strips and a sterile dressing were then applied.  There were no early apparent complications.     CONCLUSIONS:   1. Successful implantation of a implantable loop recorder for a history of cryptogenic stroke  2. No early apparent complications.   Ozell Prentice Passey, PA-C  Cardiac Electrophysiology

## 2024-06-10 ENCOUNTER — Other Ambulatory Visit
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
## 2024-06-24 ENCOUNTER — Ambulatory Visit: Admitting: Sports Medicine

## 2024-06-24 VITALS — BP 120/60 | HR 74 | Ht 66.0 in | Wt 190.0 lb

## 2024-06-24 DIAGNOSIS — M542 Cervicalgia: Secondary | ICD-10-CM

## 2024-06-24 DIAGNOSIS — M4802 Spinal stenosis, cervical region: Secondary | ICD-10-CM

## 2024-06-24 DIAGNOSIS — M5030 Other cervical disc degeneration, unspecified cervical region: Secondary | ICD-10-CM

## 2024-06-24 NOTE — Patient Instructions (Signed)
 Epideral injection left sided C6-C7  - Use Tylenol  500 to 1000 mg tablets 2-3 times a day for day-to-day pain relief Continue neck exercises heating pads. Follow up 2 weeks after epidural.

## 2024-07-08 ENCOUNTER — Telehealth: Payer: Self-pay | Admitting: Cardiology

## 2024-07-08 MED ORDER — RANOLAZINE ER 500 MG PO TB12: 500.0000 mg | ORAL_TABLET | Freq: Two times a day (BID) | ORAL | 2 refills | Status: AC

## 2024-07-08 NOTE — Telephone Encounter (Signed)
 Refill sent.

## 2024-07-08 NOTE — Telephone Encounter (Signed)
*  STAT* If patient is at the pharmacy, call can be transferred to refill team.   1. Which medications need to be refilled? (please list name of each medication and dose if known)  ranolazine (RANEXA) 500 MG 12 hr tablet    2. Would you like to learn more about the convenience, safety, & potential cost savings by using the [facility]?     3. Are you open to using the [facility] (Type [facility].  ).   4. Which pharmacy/location (including street and city if local pharmacy) is medication to be sent to?  [facility] - [place], [place] - [address]     5. Do they need a 30 day or 90 day supply? 90 day

## 2024-07-11 ENCOUNTER — Ambulatory Visit (INDEPENDENT_AMBULATORY_CARE_PROVIDER_SITE_OTHER)

## 2024-07-11 DIAGNOSIS — I639 Cerebral infarction, unspecified: Secondary | ICD-10-CM

## 2024-07-12 LAB — CUP PACEART REMOTE DEVICE CHECK
Date Time Interrogation Session: 20251017210011
Implantable Pulse Generator Implant Date: 20250916

## 2024-07-15 NOTE — Progress Notes (Signed)
 Remote Loop Recorder Transmission

## 2024-07-19 ENCOUNTER — Ambulatory Visit: Payer: Self-pay | Admitting: Cardiology

## 2024-08-11 ENCOUNTER — Encounter

## 2024-08-11 ENCOUNTER — Ambulatory Visit: Attending: Cardiology

## 2024-08-11 DIAGNOSIS — I639 Cerebral infarction, unspecified: Secondary | ICD-10-CM | POA: Diagnosis not present

## 2024-08-11 LAB — CUP PACEART REMOTE DEVICE CHECK
Date Time Interrogation Session: 20251119234123
Implantable Pulse Generator Implant Date: 20250916

## 2024-08-12 ENCOUNTER — Ambulatory Visit

## 2024-08-14 ENCOUNTER — Ambulatory Visit: Payer: Self-pay | Admitting: Cardiology

## 2024-08-15 NOTE — Progress Notes (Signed)
 Remote Loop Recorder Transmission

## 2024-09-11 ENCOUNTER — Ambulatory Visit

## 2024-09-11 DIAGNOSIS — I639 Cerebral infarction, unspecified: Secondary | ICD-10-CM | POA: Diagnosis not present

## 2024-09-12 ENCOUNTER — Ambulatory Visit

## 2024-09-12 ENCOUNTER — Encounter

## 2024-09-13 LAB — CUP PACEART REMOTE DEVICE CHECK
Date Time Interrogation Session: 20251220233541
Implantable Pulse Generator Implant Date: 20250916

## 2024-09-14 NOTE — Progress Notes (Signed)
 Remote Loop Recorder Transmission

## 2024-09-19 ENCOUNTER — Ambulatory Visit: Payer: Self-pay | Admitting: Cardiology

## 2024-09-20 ENCOUNTER — Other Ambulatory Visit: Payer: Self-pay | Admitting: Cardiology

## 2024-10-03 ENCOUNTER — Encounter: Payer: Self-pay | Admitting: Family Medicine

## 2024-10-12 ENCOUNTER — Ambulatory Visit

## 2024-10-12 DIAGNOSIS — I639 Cerebral infarction, unspecified: Secondary | ICD-10-CM | POA: Diagnosis not present

## 2024-10-12 LAB — CUP PACEART REMOTE DEVICE CHECK
Date Time Interrogation Session: 20260120234031
Implantable Pulse Generator Implant Date: 20250916

## 2024-10-13 ENCOUNTER — Encounter

## 2024-10-13 ENCOUNTER — Ambulatory Visit: Payer: Self-pay | Admitting: Cardiology

## 2024-10-13 ENCOUNTER — Ambulatory Visit

## 2024-10-13 NOTE — Progress Notes (Unsigned)
 " [name] [address] [place] [address] Phone: [phone] Subjective:   Michael [name] Michael [name], am serving as a scribe for Dr. Arthea Claudene.  I'm seeing this patient by the request  of:  [name], MD  CC: Low back pain, left elbow pain  YEP:Dlagzrupcz  Michael [name] is a 69 y.o. male coming in with complaint of lumbar spine pain. Last OMT was January 2025. Patient states that he is having L elbow pain over tricep insertion and lateral epi.   L side of lumbar spine is also bothering him. Constant soreness. Would like to do PT. Painful from sit to stand.        Past Medical History:  Diagnosis Date   Acute lateral meniscal tear, right, initial encounter 09/26/2021   Injection given September 26, 2018.   CAD S/P PCI to Cx & RCA    a. s/p PCI of LCx (2006) and ostium of RCA (2007) as well as DES to RCA in [place] (05/2014)   HTN (hypertension)    Hyperlipidemia    Myocardial infarction (HCC) 05/29/2011   Plantar fasciitis 10/12/2017   Past Surgical History:  Procedure Laterality Date   CARDIAC CATHETERIZATION N/A 05/06/2016   Procedure: Left Heart Cath and Coronary Angiography;  Surgeon: Lonni Hanson, MD;  Location: [facility] INVASIVE CV LAB;  Service: Cardiovascular;  Laterality: N/A;   CARDIAC CATHETERIZATION N/A 05/06/2016   Procedure: Coronary Stent Intervention;  Surgeon: Lonni Hanson, MD;  Location: [facility] INVASIVE CV LAB;  Service: Cardiovascular;  Laterality: N/A;   CARDIAC CATHETERIZATION N/A 05/13/2016   Procedure: Coronary/Graft Angiography;  Surgeon: Peter M Jordan, MD;  Location: [facility] INVASIVE CV LAB;  Service: Cardiovascular;  Laterality: N/A;   CORONARY ANGIOPLASTY WITH STENT PLACEMENT  09/22/2004   in [place] , occluded CFX, s/p Taxus stent   CORONARY ANGIOPLASTY WITH STENT PLACEMENT  09/22/2005   in [place] , ostial RCA stent   CORONARY ANGIOPLASTY WITH STENT PLACEMENT  09/22/2013   in [place] , RCA  stent   CORONARY PRESSURE/FFR STUDY N/A 10/02/2023   Procedure: CORONARY PRESSURE/FFR STUDY;  Surgeon: Wendel Lurena POUR, MD;  Location: [facility] INVASIVE CV LAB;  Service: Cardiovascular;  Laterality: N/A;   LEFT HEART CATH AND CORONARY ANGIOGRAPHY N/A 10/02/2023   Procedure: LEFT HEART CATH AND CORONARY ANGIOGRAPHY;  Surgeon: Wendel Lurena POUR, MD;  Location: [facility] INVASIVE CV LAB;  Service: Cardiovascular;  Laterality: N/A;   TONSILLECTOMY AND ADENOIDECTOMY      TRANSESOPHAGEAL ECHOCARDIOGRAM (CATH LAB) N/A 04/29/2024   Procedure: TRANSESOPHAGEAL ECHOCARDIOGRAM;  Surgeon: Delford Maude BROCKS, MD;  Location: [facility] INVASIVE CV LAB;  Service: Cardiovascular;  Laterality: N/A;   Social History   Socioeconomic History   Marital status: Married    Spouse name: Not on file   Number of children: 4   Years of education: Not on file   Highest education level: Not on file  Occupational History   Occupation: retired  Tobacco Use   Smoking status: Never   Smokeless tobacco: Never  Vaping Use   Vaping status: Never Used  Substance and Sexual Activity   Alcohol use: No    Alcohol/week: 0.0 standard drinks of alcohol   Drug use: No   Sexual activity: Yes  Other Topics Concern   Not on file  Social History Narrative   Lives with wife.  4 kids total (2 step, 2 biological). 1 that has CP. Daughter Michael [name]- comes to [place]).        Retired  Water Quality Scientist.  Oldest daughter has CP      Hobbies: gardening, lawn care, care for grandkids   Social Drivers of Health   Tobacco Use: Low Risk (06/07/2024)   Patient History    Smoking Tobacco Use: Never    Smokeless Tobacco Use: Never    Passive Exposure: Not on file  Financial Resource Strain: Low Risk (04/04/2024)   Overall Financial Resource Strain (CARDIA)    Difficulty of Paying Living Expenses: Not hard at all  Food Insecurity: No Food Insecurity (05/02/2024)   Epic    Worried About Programme Researcher, Broadcasting/film/video in the Last Year: Never true    Ran Out of  Food in the Last Year: Never true  Transportation Needs: No Transportation Needs (05/02/2024)   Epic    Lack of Transportation (Medical): No    Lack of Transportation (Non-Medical): No  Physical Activity: Sufficiently Active (04/04/2024)   Exercise Vital Sign    Days of Exercise per Week: 2 days    Minutes of Exercise per Session: 90 min  Stress: No Stress Concern Present (04/04/2024)   [employer] of Occupational Health - Occupational Stress Questionnaire    Feeling of Stress: Not at all  Social Connections: Moderately Integrated (04/28/2024)   Social Connection and Isolation Panel    Frequency of Communication with Friends and Family: More than three times a week    Frequency of Social Gatherings with Friends and Family: More than three times a week    Attends Religious Services: More than 4 times per year    Active Member of Clubs or Organizations: No    Attends Banker Meetings: Never    Marital Status: Married  Depression (PHQ2-9): Low Risk (05/02/2024)   Depression (PHQ2-9)    PHQ-2 Score: 1  Alcohol Screen: Low Risk (04/04/2024)   Alcohol Screen    Last Alcohol Screening Score (AUDIT): 0  Housing: Low Risk (05/02/2024)   Epic    Unable to Pay for Housing in the Last Year: No    Number of Times Moved in the Last Year: 0    Homeless in the Last Year: No  Utilities: Not At Risk (05/02/2024)   Epic    Threatened with loss of utilities: No  Health Literacy: Adequate Health Literacy (04/04/2024)   B1300 Health Literacy    Frequency of need for help with medical instructions: Never   Allergies[1] Family History  Problem Relation Age of Onset   Hypertension Father    Dementia Father        Vascular. patient states dementia/possible alzheimers as well   Hyperlipidemia Sister    Cancer Brother        type unknown   Cancer Brother        type unknown   Heart disease Maternal Grandfather 53       Died probably of heart diseasse   Heart disease Maternal Uncle 15        Died of heart exploding   Cerebral palsy Daughter    Stroke Son        severe problems with blood clotting   Colon cancer Neg Hx    Esophageal cancer Neg Hx    Pancreatic cancer Neg Hx    Stomach cancer Neg Hx    Liver disease Neg Hx     Current Outpatient Medications (Endocrine & Metabolic):    metFORMIN (GLUCOPHAGE-XR) 500 MG 24 hr tablet, Take 500 mg by mouth 2 (two) times daily.  Current Outpatient Medications (  Cardiovascular):    atorvastatin  (LIPITOR ) 80 MG tablet, TAKE 1 TABLET DAILY   ezetimibe  (ZETIA ) 10 MG tablet, Take 1 tablet (10 mg total) by mouth daily.   isosorbide  mononitrate (IMDUR ) 60 MG 24 hr tablet, TAKE 1 TABLET DAILY   ranolazine  (RANEXA ) 500 MG 12 hr tablet, Take 1 tablet (500 mg total) by mouth 2 (two) times daily.  Current Outpatient Medications (Analgesics):    aspirin  81 MG tablet, Take 81 mg by mouth daily.  Current Outpatient Medications (Hematological):    Cyanocobalamin  (VITAMIN B-12 PO), Take 1 tablet by mouth every other day.   ticagrelor  (BRILINTA ) 60 MG TABS tablet, Take 1 tablet (60 mg total) by mouth 2 (two) times daily.  Current Outpatient Medications (Other):    Coenzyme Q10 (Q-SORB CO Q-10) 100 MG capsule, Take 1 capsule by mouth daily.   famotidine  (PEPCID ) 40 MG tablet, Take 1 tablet (40 mg total) by mouth daily. (Patient taking differently: Take 40 mg by mouth daily as needed for heartburn or indigestion.)   Reviewed prior external information including notes and imaging from  primary care provider As well as notes that were available from care everywhere and other healthcare systems.  Past medical history, social, surgical and family history all reviewed in electronic medical record.  No pertanent information unless stated regarding to the chief complaint.   Review of Systems:  No headache, visual changes, nausea, vomiting, diarrhea, constipation, dizziness, abdominal pain, skin rash, fevers, chills, night sweats, weight  loss, swollen lymph nodes, body aches, joint swelling, chest pain, shortness of breath, mood changes. POSITIVE muscle aches  Objective  Blood pressure 112/72, pulse 70, height 5' 6 (1.676 m), weight 192 lb (87.1 kg), SpO2 98%.   General: No apparent distress alert and oriented x3 mood and affect normal, dressed appropriately.  HEENT: Pupils equal, extraocular movements intact  Respiratory: Patient's speak in full sentences and does not appear short of breath  Cardiovascular: No lower extremity edema, non tender, no erythema  Patient's left elbow tender to palpation in the lateral epicondyle area.  Worsening pain with resisted extension of the wrist.  Good grip strength noted though.  Neck exam does have some loss of lordosis.  Tenderness to palpation noted. Low back significant loss of lordosis.  Tenderness to palpation in the paraspinal musculature.  Significant tightness noted in the L3-L4 area bilaterally.   Limited muscular skeletal ultrasound was performed and interpreted by CLAUDENE HUSSAR, M  Limited ultrasound of patient's left elbow shows that there is a irregularity noted that is consistent with a deep fiber tear of the common extensor tendon.  Seems to be acute on chronic.  Increasing in Doppler flow noted.  No cortical irregularity or avulsions noted of the lateral epicondylar area.  Osteopathic findings  T9 extended rotated and side bent left L2 flexed rotated and side bent right L3 flexed rotated and side bent left Sacrum right on right    Impression and Recommendations:  Lateral epicondylitis of left elbow Patient is to avoid overhead lifting, wrist brace given to wear day and night for 2 weeks and nightly for 2 weeks.  Discussed icing regimen and home exercises.  Discussed with patient worsening pain and injections and formal physical therapy may be necessary.  Follow-up again in 6 to 12 weeks.    The above documentation has been reviewed and is accurate and complete  Hussar CHRISTELLA Claudene, DO       [1]  Allergies Allergen Reactions   Prednisone  Itching   "

## 2024-10-14 ENCOUNTER — Ambulatory Visit: Admitting: Family Medicine

## 2024-10-14 ENCOUNTER — Encounter: Payer: Self-pay | Admitting: Family Medicine

## 2024-10-14 ENCOUNTER — Other Ambulatory Visit: Payer: Self-pay

## 2024-10-14 VITALS — BP 112/72 | HR 70 | Ht 66.0 in | Wt 192.0 lb

## 2024-10-14 DIAGNOSIS — M25522 Pain in left elbow: Secondary | ICD-10-CM | POA: Diagnosis not present

## 2024-10-14 DIAGNOSIS — M533 Sacrococcygeal disorders, not elsewhere classified: Secondary | ICD-10-CM

## 2024-10-14 DIAGNOSIS — M9902 Segmental and somatic dysfunction of thoracic region: Secondary | ICD-10-CM | POA: Diagnosis not present

## 2024-10-14 DIAGNOSIS — M9904 Segmental and somatic dysfunction of sacral region: Secondary | ICD-10-CM | POA: Diagnosis not present

## 2024-10-14 DIAGNOSIS — M7712 Lateral epicondylitis, left elbow: Secondary | ICD-10-CM | POA: Diagnosis not present

## 2024-10-14 DIAGNOSIS — M9903 Segmental and somatic dysfunction of lumbar region: Secondary | ICD-10-CM

## 2024-10-14 NOTE — Assessment & Plan Note (Signed)
 Patient is to avoid overhead lifting, wrist brace given to wear day and night for 2 weeks and nightly for 2 weeks.  Discussed icing regimen and home exercises.  Discussed with patient worsening pain and injections and formal physical therapy may be necessary.  Follow-up again in 6 to 12 weeks.

## 2024-10-14 NOTE — Assessment & Plan Note (Signed)
 Chronic back pain.  Discussed posture and ergonomics, discussed which activities to do and which ones to avoid.  Increase activity slowly.  Patient has not been seen for a while.  Has not been quite as active.  We discussed lifting mechanics.  Follow-up again in 6 to 12 weeks.

## 2024-10-14 NOTE — Patient Instructions (Signed)
 Do prescribed exercises at least 3x a week Avoid overhand lifting Ice after a long day See you again in 6-8 weeks

## 2024-10-15 NOTE — Progress Notes (Signed)
 Remote Loop Recorder Transmission

## 2024-10-17 ENCOUNTER — Ambulatory Visit: Admitting: Family Medicine

## 2024-10-31 ENCOUNTER — Ambulatory Visit: Admitting: Family Medicine

## 2024-11-12 ENCOUNTER — Ambulatory Visit: Payer: Self-pay

## 2024-11-14 ENCOUNTER — Ambulatory Visit

## 2024-11-14 ENCOUNTER — Encounter

## 2024-11-25 ENCOUNTER — Ambulatory Visit: Admitting: Family Medicine

## 2025-01-17 ENCOUNTER — Ambulatory Visit: Admitting: Family Medicine

## 2025-04-10 ENCOUNTER — Ambulatory Visit
